# Patient Record
Sex: Female | Born: 1945
Health system: Southern US, Community
[De-identification: ages and names within clinical notes are randomized; demographics above are authoritative.]

## PROBLEM LIST (undated history)

## (undated) ENCOUNTER — Emergency Department (HOSPITAL_COMMUNITY): Admission: EM | Payer: Self-pay

## (undated) DIAGNOSIS — E559 Vitamin D deficiency, unspecified: Secondary | ICD-10-CM

## (undated) DIAGNOSIS — C50411 Malignant neoplasm of upper-outer quadrant of right female breast: Principal | ICD-10-CM

## (undated) DIAGNOSIS — Z923 Personal history of irradiation: Secondary | ICD-10-CM

## (undated) DIAGNOSIS — I10 Essential (primary) hypertension: Secondary | ICD-10-CM

## (undated) DIAGNOSIS — D649 Anemia, unspecified: Secondary | ICD-10-CM

## (undated) DIAGNOSIS — C50919 Malignant neoplasm of unspecified site of unspecified female breast: Secondary | ICD-10-CM

## (undated) DIAGNOSIS — E7800 Pure hypercholesterolemia, unspecified: Secondary | ICD-10-CM

## (undated) HISTORY — DX: Malignant neoplasm of unspecified site of unspecified female breast: C50.919

## (undated) HISTORY — DX: Anemia, unspecified: D64.9

## (undated) HISTORY — DX: Vitamin D deficiency, unspecified: E55.9

## (undated) HISTORY — DX: Malignant neoplasm of upper-outer quadrant of right female breast: C50.411

---

## 1998-01-02 ENCOUNTER — Emergency Department (HOSPITAL_COMMUNITY): Admission: EM | Admit: 1998-01-02 | Discharge: 1998-01-02 | Payer: Self-pay | Admitting: Emergency Medicine

## 1999-07-24 ENCOUNTER — Emergency Department (HOSPITAL_COMMUNITY): Admission: EM | Admit: 1999-07-24 | Discharge: 1999-07-24 | Payer: Self-pay | Admitting: Emergency Medicine

## 1999-07-24 ENCOUNTER — Encounter: Payer: Self-pay | Admitting: *Deleted

## 2004-05-15 ENCOUNTER — Emergency Department (HOSPITAL_COMMUNITY): Admission: EM | Admit: 2004-05-15 | Discharge: 2004-05-16 | Payer: Self-pay | Admitting: Emergency Medicine

## 2005-02-08 ENCOUNTER — Emergency Department (HOSPITAL_COMMUNITY): Admission: EM | Admit: 2005-02-08 | Discharge: 2005-02-08 | Payer: Self-pay | Admitting: Family Medicine

## 2005-02-10 ENCOUNTER — Emergency Department (HOSPITAL_COMMUNITY): Admission: EM | Admit: 2005-02-10 | Discharge: 2005-02-10 | Payer: Self-pay | Admitting: Emergency Medicine

## 2005-02-19 ENCOUNTER — Ambulatory Visit: Payer: Self-pay | Admitting: Family Medicine

## 2005-03-18 ENCOUNTER — Ambulatory Visit: Payer: Self-pay | Admitting: Family Medicine

## 2005-03-31 ENCOUNTER — Ambulatory Visit: Payer: Self-pay | Admitting: *Deleted

## 2005-04-13 ENCOUNTER — Encounter: Admission: RE | Admit: 2005-04-13 | Discharge: 2005-04-13 | Payer: Self-pay | Admitting: Family Medicine

## 2005-04-21 ENCOUNTER — Encounter: Admission: RE | Admit: 2005-04-21 | Discharge: 2005-04-21 | Payer: Self-pay | Admitting: Family Medicine

## 2005-05-13 ENCOUNTER — Ambulatory Visit: Payer: Self-pay | Admitting: Family Medicine

## 2005-06-14 ENCOUNTER — Ambulatory Visit: Payer: Self-pay | Admitting: Internal Medicine

## 2005-06-22 ENCOUNTER — Ambulatory Visit: Payer: Self-pay | Admitting: Family Medicine

## 2005-09-01 ENCOUNTER — Ambulatory Visit: Payer: Self-pay | Admitting: Family Medicine

## 2005-11-03 ENCOUNTER — Encounter: Admission: RE | Admit: 2005-11-03 | Discharge: 2005-11-03 | Payer: Self-pay | Admitting: Internal Medicine

## 2005-11-24 ENCOUNTER — Ambulatory Visit: Payer: Self-pay | Admitting: Internal Medicine

## 2006-04-21 ENCOUNTER — Ambulatory Visit: Payer: Self-pay | Admitting: Family Medicine

## 2006-05-03 ENCOUNTER — Ambulatory Visit: Payer: Self-pay | Admitting: Family Medicine

## 2006-08-23 DIAGNOSIS — I10 Essential (primary) hypertension: Secondary | ICD-10-CM

## 2006-09-12 ENCOUNTER — Ambulatory Visit: Payer: Self-pay | Admitting: Family Medicine

## 2006-09-12 LAB — CONVERTED CEMR LAB
Cholesterol: 144 mg/dL
HDL: 53 mg/dL
LDL Cholesterol: 74 mg/dL
Triglycerides: 85 mg/dL

## 2006-09-14 ENCOUNTER — Ambulatory Visit: Payer: Self-pay | Admitting: Family Medicine

## 2006-12-12 ENCOUNTER — Ambulatory Visit: Payer: Self-pay | Admitting: Family Medicine

## 2007-02-06 ENCOUNTER — Ambulatory Visit: Payer: Self-pay | Admitting: Family Medicine

## 2007-02-06 ENCOUNTER — Encounter (INDEPENDENT_AMBULATORY_CARE_PROVIDER_SITE_OTHER): Payer: Self-pay | Admitting: Family Medicine

## 2007-02-21 ENCOUNTER — Encounter (INDEPENDENT_AMBULATORY_CARE_PROVIDER_SITE_OTHER): Payer: Self-pay | Admitting: Family Medicine

## 2007-05-10 ENCOUNTER — Encounter (INDEPENDENT_AMBULATORY_CARE_PROVIDER_SITE_OTHER): Payer: Self-pay | Admitting: *Deleted

## 2007-05-22 ENCOUNTER — Telehealth (INDEPENDENT_AMBULATORY_CARE_PROVIDER_SITE_OTHER): Payer: Self-pay | Admitting: Family Medicine

## 2007-05-23 ENCOUNTER — Ambulatory Visit: Payer: Self-pay | Admitting: Family Medicine

## 2008-01-11 ENCOUNTER — Telehealth (INDEPENDENT_AMBULATORY_CARE_PROVIDER_SITE_OTHER): Payer: Self-pay | Admitting: Family Medicine

## 2008-03-18 ENCOUNTER — Ambulatory Visit: Payer: Self-pay | Admitting: Family Medicine

## 2008-03-18 ENCOUNTER — Encounter (INDEPENDENT_AMBULATORY_CARE_PROVIDER_SITE_OTHER): Payer: Self-pay | Admitting: Family Medicine

## 2008-03-18 DIAGNOSIS — E7800 Pure hypercholesterolemia, unspecified: Secondary | ICD-10-CM

## 2008-03-18 LAB — CONVERTED CEMR LAB
Glucose, Urine, Semiquant: NEGATIVE
Nitrite: NEGATIVE
WBC Urine, dipstick: NEGATIVE

## 2008-03-19 LAB — CONVERTED CEMR LAB: Pap Smear: NORMAL

## 2008-03-21 ENCOUNTER — Encounter (INDEPENDENT_AMBULATORY_CARE_PROVIDER_SITE_OTHER): Payer: Self-pay | Admitting: Family Medicine

## 2008-03-21 LAB — CONVERTED CEMR LAB
ALT: 14 units/L (ref 0–35)
AST: 17 units/L (ref 0–37)
Albumin: 4.6 g/dL (ref 3.5–5.2)
Alkaline Phosphatase: 115 units/L (ref 39–117)
Basophils Absolute: 0 10*3/uL (ref 0.0–0.1)
Calcium: 9.6 mg/dL (ref 8.4–10.5)
Cholesterol: 136 mg/dL (ref 0–200)
Eosinophils Absolute: 0.1 10*3/uL (ref 0.0–0.7)
Glucose, Bld: 101 mg/dL — ABNORMAL HIGH (ref 70–99)
Lymphs Abs: 2.2 10*3/uL (ref 0.7–4.0)
Monocytes Absolute: 0.4 10*3/uL (ref 0.1–1.0)
Monocytes Relative: 7 % (ref 3–12)
Neutro Abs: 2.8 10*3/uL (ref 1.7–7.7)
Potassium: 4.1 meq/L (ref 3.5–5.3)
RBC: 4.27 M/uL (ref 3.87–5.11)
RDW: 13.2 % (ref 11.5–15.5)
TSH: 1.039 microintl units/mL (ref 0.350–4.50)
Total Bilirubin: 0.6 mg/dL (ref 0.3–1.2)

## 2008-03-26 ENCOUNTER — Encounter (INDEPENDENT_AMBULATORY_CARE_PROVIDER_SITE_OTHER): Payer: Self-pay | Admitting: Family Medicine

## 2008-04-03 ENCOUNTER — Ambulatory Visit (HOSPITAL_COMMUNITY): Admission: RE | Admit: 2008-04-03 | Discharge: 2008-04-03 | Payer: Self-pay | Admitting: Family Medicine

## 2008-04-11 ENCOUNTER — Encounter (INDEPENDENT_AMBULATORY_CARE_PROVIDER_SITE_OTHER): Payer: Self-pay | Admitting: Family Medicine

## 2008-04-11 ENCOUNTER — Encounter: Admission: RE | Admit: 2008-04-11 | Discharge: 2008-04-11 | Payer: Self-pay | Admitting: Family Medicine

## 2009-04-10 ENCOUNTER — Encounter: Admission: RE | Admit: 2009-04-10 | Discharge: 2009-04-10 | Payer: Self-pay | Admitting: Internal Medicine

## 2009-04-21 ENCOUNTER — Ambulatory Visit: Payer: Self-pay | Admitting: Physician Assistant

## 2009-04-21 DIAGNOSIS — K029 Dental caries, unspecified: Secondary | ICD-10-CM | POA: Insufficient documentation

## 2009-04-22 ENCOUNTER — Encounter: Payer: Self-pay | Admitting: Physician Assistant

## 2009-04-23 ENCOUNTER — Telehealth: Payer: Self-pay | Admitting: Physician Assistant

## 2009-04-23 LAB — CONVERTED CEMR LAB
ALT: 13 units/L (ref 0–35)
Albumin: 4.5 g/dL (ref 3.5–5.2)
BUN: 17 mg/dL (ref 6–23)
Basophils Absolute: 0 10*3/uL (ref 0.0–0.1)
Basophils Relative: 1 % (ref 0–1)
Calcium: 10 mg/dL (ref 8.4–10.5)
Cholesterol: 141 mg/dL (ref 0–200)
Creatinine, Ser: 0.93 mg/dL (ref 0.40–1.20)
Eosinophils Absolute: 0.1 10*3/uL (ref 0.0–0.7)
Glucose, Bld: 89 mg/dL (ref 70–99)
Lymphs Abs: 2 10*3/uL (ref 0.7–4.0)
MCHC: 32.7 g/dL (ref 30.0–36.0)
Monocytes Absolute: 0.5 10*3/uL (ref 0.1–1.0)
Monocytes Relative: 8 % (ref 3–12)
Neutro Abs: 3.5 10*3/uL (ref 1.7–7.7)
Potassium: 4.6 meq/L (ref 3.5–5.3)
RDW: 13.5 % (ref 11.5–15.5)
Sodium: 141 meq/L (ref 135–145)
VLDL: 14 mg/dL (ref 0–40)

## 2009-04-24 ENCOUNTER — Encounter: Payer: Self-pay | Admitting: Physician Assistant

## 2009-04-29 ENCOUNTER — Telehealth: Payer: Self-pay | Admitting: Physician Assistant

## 2009-05-02 LAB — CONVERTED CEMR LAB
Iron: 86 ug/dL (ref 42–145)
UIBC: 258 ug/dL

## 2009-05-06 ENCOUNTER — Ambulatory Visit: Payer: Self-pay | Admitting: Internal Medicine

## 2009-05-13 ENCOUNTER — Encounter: Payer: Self-pay | Admitting: Physician Assistant

## 2009-05-21 ENCOUNTER — Ambulatory Visit: Payer: Self-pay | Admitting: Physician Assistant

## 2009-05-22 ENCOUNTER — Encounter: Payer: Self-pay | Admitting: Physician Assistant

## 2009-05-22 DIAGNOSIS — D649 Anemia, unspecified: Secondary | ICD-10-CM | POA: Insufficient documentation

## 2009-06-02 LAB — CONVERTED CEMR LAB
Basophils Absolute: 0 10*3/uL (ref 0.0–0.1)
HCT: 32.5 % — ABNORMAL LOW (ref 36.0–46.0)
Hemoglobin: 10.4 g/dL — ABNORMAL LOW (ref 12.0–15.0)
Lymphocytes Relative: 45 % (ref 12–46)
MCHC: 32 g/dL (ref 30.0–36.0)
MCV: 80 fL (ref 78.0–100.0)
Monocytes Relative: 8 % (ref 3–12)
Neutrophils Relative %: 45 % (ref 43–77)
Platelets: 282 10*3/uL (ref 150–400)

## 2009-06-30 ENCOUNTER — Ambulatory Visit: Payer: Self-pay | Admitting: Physician Assistant

## 2009-07-21 ENCOUNTER — Encounter (INDEPENDENT_AMBULATORY_CARE_PROVIDER_SITE_OTHER): Payer: Self-pay | Admitting: *Deleted

## 2009-07-21 LAB — CONVERTED CEMR LAB
Basophils Absolute: 0 10*3/uL (ref 0.0–0.1)
Basophils Relative: 1 % (ref 0–1)
Eosinophils Absolute: 0.1 10*3/uL (ref 0.0–0.7)
HCT: 33.9 % — ABNORMAL LOW (ref 36.0–46.0)
Hemoglobin: 10.8 g/dL — ABNORMAL LOW (ref 12.0–15.0)
MCHC: 31.9 g/dL (ref 30.0–36.0)
MCV: 83.1 fL (ref 78.0–100.0)
Monocytes Absolute: 0.4 10*3/uL (ref 0.1–1.0)
Neutro Abs: 3.1 10*3/uL (ref 1.7–7.7)
RBC: 4.08 M/uL (ref 3.87–5.11)

## 2009-08-06 ENCOUNTER — Ambulatory Visit: Payer: Self-pay | Admitting: Physician Assistant

## 2009-08-12 ENCOUNTER — Encounter: Payer: Self-pay | Admitting: Physician Assistant

## 2009-08-12 LAB — CONVERTED CEMR LAB: Hgb A: 97.5 % (ref 96.8–97.8)

## 2009-11-27 ENCOUNTER — Telehealth: Payer: Self-pay | Admitting: Physician Assistant

## 2010-02-26 ENCOUNTER — Telehealth: Payer: Self-pay | Admitting: Physician Assistant

## 2010-04-01 ENCOUNTER — Ambulatory Visit: Payer: Self-pay | Admitting: Physician Assistant

## 2010-04-01 DIAGNOSIS — R82998 Other abnormal findings in urine: Secondary | ICD-10-CM

## 2010-04-01 DIAGNOSIS — R002 Palpitations: Secondary | ICD-10-CM

## 2010-04-01 DIAGNOSIS — K5900 Constipation, unspecified: Secondary | ICD-10-CM | POA: Insufficient documentation

## 2010-04-02 ENCOUNTER — Encounter: Payer: Self-pay | Admitting: Physician Assistant

## 2010-04-06 ENCOUNTER — Encounter: Payer: Self-pay | Admitting: Physician Assistant

## 2010-04-08 ENCOUNTER — Emergency Department (HOSPITAL_COMMUNITY): Admission: EM | Admit: 2010-04-08 | Discharge: 2010-04-08 | Payer: Self-pay | Admitting: Family Medicine

## 2010-04-08 ENCOUNTER — Encounter (INDEPENDENT_AMBULATORY_CARE_PROVIDER_SITE_OTHER): Payer: Self-pay | Admitting: *Deleted

## 2010-04-15 ENCOUNTER — Encounter: Admission: RE | Admit: 2010-04-15 | Discharge: 2010-04-15 | Payer: Self-pay | Admitting: Internal Medicine

## 2010-04-22 ENCOUNTER — Encounter (INDEPENDENT_AMBULATORY_CARE_PROVIDER_SITE_OTHER): Payer: Self-pay | Admitting: *Deleted

## 2010-06-18 ENCOUNTER — Encounter (INDEPENDENT_AMBULATORY_CARE_PROVIDER_SITE_OTHER): Payer: Self-pay | Admitting: *Deleted

## 2010-08-28 ENCOUNTER — Encounter (INDEPENDENT_AMBULATORY_CARE_PROVIDER_SITE_OTHER): Payer: Self-pay | Admitting: Nurse Practitioner

## 2010-08-28 ENCOUNTER — Ambulatory Visit
Admission: RE | Admit: 2010-08-28 | Discharge: 2010-08-28 | Payer: Self-pay | Source: Home / Self Care | Attending: Nurse Practitioner | Admitting: Nurse Practitioner

## 2010-08-28 LAB — CONVERTED CEMR LAB
HDL goal, serum: 40 mg/dL
LDL Goal: 130 mg/dL

## 2010-09-13 ENCOUNTER — Encounter: Payer: Self-pay | Admitting: Occupational Therapy

## 2010-09-20 LAB — CONVERTED CEMR LAB
ALT: 15 units/L (ref 0–35)
BUN: 17 mg/dL (ref 6–23)
CO2: 27 meq/L (ref 19–32)
Calcium: 9.9 mg/dL (ref 8.4–10.5)
Creatinine, Ser: 1.12 mg/dL (ref 0.40–1.20)
Eosinophils Absolute: 0.1 10*3/uL (ref 0.0–0.7)
Eosinophils Relative: 3 % (ref 0–5)
Glucose, Urine, Semiquant: NEGATIVE
HCT: 34.2 % — ABNORMAL LOW (ref 36.0–46.0)
Lymphocytes Relative: 51 % — ABNORMAL HIGH (ref 12–46)
Lymphs Abs: 2 10*3/uL (ref 0.7–4.0)
MCV: 83 fL (ref 78.0–100.0)
Monocytes Absolute: 0.3 10*3/uL (ref 0.1–1.0)
Neutro Abs: 1.5 10*3/uL — ABNORMAL LOW (ref 1.7–7.7)
Neutrophils Relative %: 38 % — ABNORMAL LOW (ref 43–77)
Nitrite: NEGATIVE
Platelets: 270 10*3/uL (ref 150–400)
Potassium: 3.9 meq/L (ref 3.5–5.3)
RDW: 13.8 % (ref 11.5–15.5)
Sodium: 140 meq/L (ref 135–145)
Specific Gravity, Urine: 1.01
Total CHOL/HDL Ratio: 2.8
Total Protein: 7.7 g/dL (ref 6.0–8.3)
Urobilinogen, UA: 0.2
WBC: 3.9 10*3/uL — ABNORMAL LOW (ref 4.0–10.5)

## 2010-09-24 NOTE — Progress Notes (Signed)
Summary: Needs f/u; Refill x 3  Phone Note Outgoing Call   Summary of Call: Needs f/u appt.  Initial call taken by: Tereso Newcomer PA-C,  November 27, 2009 9:40 AM  Follow-up for Phone Call        left message to return call....Marland KitchenMarland KitchenMikey College CMA  November 28, 2009 8:58 AM   Left message on answering machine for pt to call back.Marland KitchenMarland KitchenMarland KitchenArmenia Shannon  November 28, 2009 12:58 PM   Additional Follow-up for Phone Call Additional follow up Details #1::        pt is aware and has appt Additional Follow-up by: [name],  December 01, 2009 3:38 PM    Prescriptions: TIAZAC 120 MG CP24 (DILTIAZEM HCL ER BEADS) TAKE ONE CAPSULE BY MOUTH DAILY   GENE HAS ICP TIAZAC 120MG  0508  #30 x 2   Entered and Authorized by:   Tereso Newcomer PA-C   Signed by:   Tereso Newcomer PA-C on 11/27/2009   Method used:   Faxed to ...       [facility] (retail)       [address] [place], [place]  [address]       Ph: [phone]       Fax: [phone]   RxID:   [id number] CRESTOR 5 MG TABS (ROSUVASTATIN CALCIUM) TAKE ONE (1) TABLET AT BEDTIME  #30 x 2   Entered and Authorized by:   Tereso Newcomer PA-C   Signed by:   Tereso Newcomer PA-C on 11/27/2009   Method used:   Faxed to ...       [facility] - [facility] (retail)       [address] [place], [place]  [address]       Ph: [phone]       Fax: [phone]   RxID:   [id number] BENICAR HCT 40-25 MG TABS (OLMESARTAN MEDOXOMIL-HCTZ) TAKE ONE (1) TABLET BY MOUTH EVERY DAY  #30 x 2   Entered and Authorized by:   Tereso Newcomer PA-C   Signed by:   Tereso Newcomer PA-C on 11/27/2009   Method used:   Faxed to ...       [facility] - [facility] (retail)       [address] [place], [place]  [address]       Ph: [phone]       Fax: [phone]   RxID:   [id number]

## 2010-09-24 NOTE — Letter (Signed)
Summary: *HSN Results Follow up  [facility]  [address] [place], [place] [address]   Phone: [phone]  Fax: [phone]      04/02/2010   MARYL BLALOCK [address] [place], [place]  [address]   Dear  Ms. Kaylyn Sebastiani,                            ____S.Drinkard,FNP   ____D. Gore,FNP       ____B. McPherson,MD   ____V. Rankins,MD    ____E. Mulberry,MD    ____N. Daphine Deutscher, FNP  ____D. Reche Dixon, MD    ____K. Philipp Deputy, MD    __x__S. Alben Spittle, PA-C    This letter is to inform you that your recent test(s):  _______Pap Smear    ___x____Lab Test     _______X-ray    ___x____ is within acceptable limits  _______ requires a medication change  _______ requires a follow-up lab visit  ___x____ requires  follow-up testing   Comments:  Kidney, liver function, cholesterol and thyroid were all ok.  Blood counts are still low and unchanged.  Please turn in the stool cards.  Someone will call you to arrange the colonoscopy.       _________________________________________________________ If you have any questions, please contact our office                     Sincerely,  Tereso Newcomer PA-C [facility]

## 2010-09-24 NOTE — Assessment & Plan Note (Signed)
Summary: f/u////cns   Vital Signs:  Patient profile:   65 year old female Height:      18857 inches Weight:      188 pounds BMI:     0.00 Temp:     97.8 degrees F oral Pulse rate:   85 / minute Pulse rhythm:   regular Resp:     18 per minute BP sitting:   115 / 73  (left arm) Cuff size:   regular  Vitals Entered By: [name] (April 01, 2010 2:06 PM) CC: follow-up visit Is Patient Diabetic? No Pain Assessment Patient in pain? no       Does patient need assistance? Functional Status Self care Ambulation Normal   Primary Care Provider:  Tereso Newcomer PA-C  CC:  follow-up visit.  History of Present Illness: Here for f/u. Not seen in a long time. Had palps last night.   Felt like heart racing for an hour.  No assoc. symptoms. Denies chest pain, dyspnea, syncope.  No orthopnea or PND.  No edmea.  No chronic cough.   No fevers or chills. No over the counter stimulants. Does admit to caffeine.   Current Medications (verified): 1)  Benicar Hct 40-25 Mg Tabs (Olmesartan Medoxomil-Hctz) .... Take One (1) Tablet By Mouth Every Day 2)  Crestor 5 Mg Tabs (Rosuvastatin Calcium) .... Take One (1) Tablet At Bedtime 3)  Tiazac 120 Mg Cp24 (Diltiazem Hcl Er Beads) .... Take One Capsule By Mouth Daily   Gene Has Icp Tiazac 120mg  0508 4)  Triamcinolone Acetonide 0.1 % Crea (Triamcinolone Acetonide) .... Apply To Skin Rash and Rub in Well Twice Daily Until Clear 5)  Vistaril 25 Mg Caps (Hydroxyzine Pamoate) .... One Capsule Every 8 Hours As Needed For Itching  Allergies (verified): 1)  ! Ibuprofen  Past History:  Past Medical History: Last updated: 08/12/2009 Current Problems:  * IBUPROFEN ALLERGY * ALBINO * STRABISMUS O.D. * SEVERE PERIODONTAL DISEASE * DYSLIPIDEMIA Anemia-iron deficiency (08/23/2006);improved1/08   a. N/C anemia; Iron studies, B12 and folate and Hgb Elec. all normal 06/2009; stool cards pending; colo. pending Hypertension (08/23/2006) H/o breast  nodule(benign),of left breast at 9 o'clock position .Initially noted  04/21/05.Had f/u mammogram6/30/08.  Family History: mOTHER DIED ? Father died age ? Mom - ? uterine cancer  Social History: Occupation:Mail handler(PART-TIME) at [employer] Never Smoked Alcohol use-no Drug use-no drinks a lot of caffeine  Review of Systems      See HPI GI:  Complains of constipation. GU:  Denies dysuria and urinary frequency.  Physical Exam  General:  alert, well-developed, and well-nourished.   Head:  normocephalic and atraumatic.   Eyes:  pupils equal, pupils round, and pupils reactive to light.   Mouth:  poor dentition.   Neck:  supple, no JVD, no carotid bruits, and no cervical lymphadenopathy.   Lungs:  normal breath sounds, no crackles, and no wheezes.   Heart:  normal rate, regular rhythm, and no murmur.   Abdomen:  soft, non-tender, and normal bowel sounds.   Extremities:  no edema  Neurologic:  alert & oriented X3 and cranial nerves II-XII intact.   Psych:  normally interactive.     Impression & Recommendations:  Problem # 1:  PALPITATIONS (ICD-785.1)  ? etiology get holter and echo check ecg today reduce caffeine f/u with me in 2 mos  Orders: EKG w/ Interpretation (93000) 2 D Echo (2 D Echo) 24 Hr Holter (24 Hr Holter) T-TSH ([address]-98119)  Problem # 2:  HYPERTENSION (  ICD-401.9)  controlled  Her updated medication list for this problem includes:    Benicar Hct 40-25 Mg Tabs (Olmesartan medoxomil-hctz) .[name]... Take one (1) tablet by mouth every day    Tiazac 120 Mg Cp24 (Diltiazem hcl er beads) .[name]... Take one capsule by mouth daily   gene has icp tiazac 120mg  0508  Orders: T-Comprehensive Metabolic Panel ([address]) UA Dipstick w/o Micro (manual) ([address])  Problem # 3:  HYPERLIPIDEMIA (ICD-272.4)  Her updated medication list for this problem includes:    Crestor 5 Mg Tabs (Rosuvastatin calcium) .[name]... Take one (1) tablet at  bedtime  Orders: T-Comprehensive Metabolic Panel [id number]) T-Lipid Profile ([address])  Problem # 4:  ANEMIA (ICD-285.9) hgb electro previously normal iron studies, b12 and folate ok ? related to chronic periodontal disease never had colo agreeable now refer to Dr. Doreatha Martin  Orders: T-CBC w/Diff [phone]) T-Hemoccult Cards-Multiple [id number]) Gastroenterology Referral (GI)  Problem # 5:  URINALYSIS, ABNORMAL (ICD-791.9) no symptoms of dysuria or frequency check cx  Orders: T-Culture, Urine ([id number])  Problem # 6:  CONSTIPATION (ICD-564.00) high fiber diet  Problem # 7:  DENTAL CARIES (ICD-521.00)  refer to dental  Orders: Dental Referral (Dentist)  Problem # 8:  Preventive Health Care (ICD-V70.0) has appt for mammo  Complete Medication List: 1)  Benicar Hct 40-25 Mg Tabs (Olmesartan medoxomil-hctz) .... Take one (1) tablet by mouth every day 2)  Crestor 5 Mg Tabs (Rosuvastatin calcium) .... Take one (1) tablet at bedtime 3)  Tiazac 120 Mg Cp24 (Diltiazem hcl er beads) .... Take one capsule by mouth daily   gene has icp tiazac 120mg  0508 4)  Triamcinolone Acetonide 0.1 % Crea (Triamcinolone acetonide) .... Apply to skin rash and rub in well twice daily until clear 5)  Vistaril 25 Mg Caps (Hydroxyzine pamoate) .... One capsule every 8 hours as needed for itching  Patient Instructions: 1)  Cut back on your caffeine.  Try to drink 1-2 cups of coffee or tea a day. 2)  Try to avoid over the counter decongestants and cold medicines like Sudafed, etc. 3)  Drink one glass of Metamucil a day. 4)  See handout about fiber. 5)  Follow up with me if constipation no better. 6)  Please schedule a follow-up appointment in 2 months for heart palpitations. 7)    Prescriptions: TIAZAC 120 MG CP24 (DILTIAZEM HCL ER BEADS) TAKE ONE CAPSULE BY MOUTH DAILY   GENE HAS ICP TIAZAC 120MG  0508  #30 x 5   Entered and Authorized by:   Tereso Newcomer PA-C   Signed by:   Tereso Newcomer PA-C  on 04/01/2010   Method used:   Faxed to ...       [facility] - [facility] (retail)       [address] [place], [place]  [address]       Ph: [phone]       Fax: [phone]   RxID:   [id number] CRESTOR 5 MG TABS (ROSUVASTATIN CALCIUM) TAKE ONE (1) TABLET AT BEDTIME  #30 x 5   Entered and Authorized by:   Tereso Newcomer PA-C   Signed by:   Tereso Newcomer PA-C on 04/01/2010   Method used:   Faxed to ...       [facility] - [facility] (retail)       [address] [place], [place]  [address]       Ph: [phone]  Fax: [phone]   RxID:   [id number] BENICAR HCT 40-25 MG TABS (OLMESARTAN MEDOXOMIL-HCTZ) TAKE ONE (1) TABLET BY MOUTH EVERY DAY  #30 x 5   Entered and Authorized by:   Tereso Newcomer PA-C   Signed by:   Tereso Newcomer PA-C on 04/01/2010   Method used:   Faxed to ...       [facility] - [facility] (retail)       [address] [place], [place]  [address]       Ph: [phone]       Fax: [phone]   RxID:   [id number]   Laboratory Results   Urine Tests    Routine Urinalysis   Color: yellow Appearance: Clear Glucose: negative   (Normal Range: Negative) Bilirubin: negative   (Normal Range: Negative) Ketone: negative   (Normal Range: Negative) Spec. Gravity: 1.010   (Normal Range: 1.003-1.035) Blood: negative   (Normal Range: Negative) pH: 7.5   (Normal Range: 5.0-8.0) Protein: negative   (Normal Range: Negative) Urobilinogen: 0.2   (Normal Range: 0-1) Nitrite: negative   (Normal Range: Negative) Leukocyte Esterace: trace   (Normal Range: Negative)       Appended Document: f/u////cns    Clinical Lists Changes  Observations: Added new observation of EKG INTERP: Normal sinus rhythm with rate of:  79 normal axis no ischemic changes  (04/01/2010 17:47)       EKG  Procedure date:  04/01/2010  Findings:      Normal sinus  rhythm with rate of:  79 normal axis no ischemic changes

## 2010-09-24 NOTE — Progress Notes (Signed)
  Phone Note Outgoing Call   Summary of Call: Needs f/u.  No showed to last appt made. Refills done x 3 months. No more refills until she has a f/u.  Initial call taken by: Tereso Newcomer PA-C,  February 26, 2010 1:38 PM  Follow-up for Phone Call        pt aware and graceila will give her appt Follow-up by: [name],  February 27, 2010 4:36 PM    Prescriptions: TIAZAC 120 MG CP24 (DILTIAZEM HCL ER BEADS) TAKE ONE CAPSULE BY MOUTH DAILY   GENE HAS ICP TIAZAC 120MG  0508  #30 x 2   Entered and Authorized by:   Tereso Newcomer PA-C   Signed by:   Tereso Newcomer PA-C on 02/26/2010   Method used:   Faxed to ...       [facility] - [facility] (retail)       [address] [place], [place]  [address]       Ph: [phone]       Fax: [phone]   RxID:   [id number] CRESTOR 5 MG TABS (ROSUVASTATIN CALCIUM) TAKE ONE (1) TABLET AT BEDTIME  #30 x 2   Entered and Authorized by:   Tereso Newcomer PA-C   Signed by:   Tereso Newcomer PA-C on 02/26/2010   Method used:   Faxed to ...       [facility] - [facility] (retail)       [address] [place], [place]  [address]       Ph: [phone]       Fax: [phone]   RxID:   [id number] BENICAR HCT 40-25 MG TABS (OLMESARTAN MEDOXOMIL-HCTZ) TAKE ONE (1) TABLET BY MOUTH EVERY DAY  #30 x 2   Entered and Authorized by:   Tereso Newcomer PA-C   Signed by:   Tereso Newcomer PA-C on 02/26/2010   Method used:   Faxed to ...       [facility] - [facility] (retail)       [address] [place], [place]  [address]       Ph: [phone]       Fax: [phone]   RxID:   [id number]

## 2010-09-24 NOTE — Letter (Signed)
Summary: Handout Printed  Printed Handout:  - Diet - High-Fiber 

## 2010-09-24 NOTE — Letter (Signed)
Summary: DENTAL REFERRAL  DENTAL REFERRAL   Imported By: Arta Bruce 04/06/2010 09:46:55  _____________________________________________________________________  External Attachment:    Type:   Image     Comment:   External Document

## 2010-09-24 NOTE — Assessment & Plan Note (Signed)
Summary: HTN   Vital Signs:  Patient profile:   65 year old female Height:      65.50 inches Weight:      182.5 pounds BMI:     30.02 Temp:     97.4 degrees F oral Pulse rate:   72 / minute Pulse rhythm:   regular Resp:     16 per minute BP sitting:   130 / 80  (left arm) Cuff size:   regular  Vitals Entered By: Levon Hedger (August 28, 2010 9:16 AM)  Nutrition Counseling: Patient's BMI is greater than 25 and therefore counseled on weight management options. CC: follow-up visit.Christina Kitchenpt does not know why she is here, Lipid Management, Hypertension Management Is Patient Diabetic? No Pain Assessment Patient in pain? no       Does patient need assistance? Ambulation Normal   Primary Care Provider:  Tereso Newcomer PA-C  CC:  follow-up visit.Christina Kitchenpt does not know why she is here, Lipid Management, and Hypertension Management.  History of Present Illness:  Pt into the office to f/u on palpitation as seen on last visit. Pt has stopped drinking chocolate and caffeine. no further palpitations since she has made this change  Obesity - down 6 pounds since her last visit.   No acute problems today  Hypertension History:      She denies headache, chest pain, and palpitations.  She notes no problems with any antihypertensive medication side effects.        Positive major cardiovascular risk factors include female age 52 years old or older, hyperlipidemia, and hypertension.  Negative major cardiovascular risk factors include no history of diabetes and non-tobacco-user status.        Further assessment for target organ damage reveals no history of ASHD, stroke/TIA, or peripheral vascular disease.    Lipid Management History:      Positive NCEP/ATP III risk factors include female age 18 years old or older and hypertension.  Negative NCEP/ATP III risk factors include no history of early menopause without estrogen hormone replacement, non-diabetic, non-tobacco-user status, no ASHD  (atherosclerotic heart disease), no prior stroke/TIA, no peripheral vascular disease, and no history of aortic aneurysm.        The patient states that she does not know about the "Therapeutic Lifestyle Change" diet.  The patient does not know about adjunctive measures for cholesterol lowering.  Adjunctive measures started by the patient include weight reduction.  She expresses no side effects from her lipid-lowering medication.  The patient denies any symptoms to suggest myopathy or liver disease.      Allergies (verified): 1)  ! Ibuprofen  Review of Systems General:  Complains of sleep disorder; denies fever; she has taken some otc sleep aids without resolution of symptoms. CV:  Denies palpitations. Resp:  Denies cough. GI:  Complains of constipation; denies abdominal pain, nausea, and vomiting.  Physical Exam  General:  alert.   Head:  normocephalic.   Eyes:  left eye strabismus.   Ears:  ear piercing(s) noted.   Lungs:  normal breath sounds.   Heart:  normal rate and regular rhythm.   Msk:  up to the exam table Neurologic:  alert & oriented X3.   Skin:  color normal.   Psych:  Oriented X3.     Impression & Recommendations:  Problem # 1:  HYPERTENSION (ICD-401.9) BP is stable continue current meds DASH diet Her updated medication list for this problem includes:    Benicar Hct 40-25 Mg Tabs (Olmesartan medoxomil-hctz) .Christina KitchenMarland KitchenMarland KitchenMarland Christina [name]  Take one (1) tablet by mouth every day    Tiazac 120 Mg Cp24 (Diltiazem hcl er beads) .Christina Christina [name]... Take one capsule by mouth daily   gene has icp tiazac 120mg  0508  Orders: Rapid HIV  ([address])  Problem # 2:  HYPERLIPIDEMIA (ICD-272.4) Pt to continue med as ordered  Her updated medication list for this problem includes:    Crestor 5 Mg Tabs (Rosuvastatin calcium) .Christina Christina [name]... Take one (1) tablet at bedtime  Problem # 3:  CONSTIPATION (ICD-564.00) handout given advised pt to increase fiber in diet  Problem # 4:  PALPITATIONS (ICD-785.1) improved with decrease  in caffiene  Problem # 5:  NEED PROPHYLACTIC VACCINATION&INOCULATION FLU (ICD-V04.81) given today  Complete Medication List: 1)  Benicar Hct 40-25 Mg Tabs (Olmesartan medoxomil-hctz) .... Take one (1) tablet by mouth every day 2)  Crestor 5 Mg Tabs (Rosuvastatin calcium) .... Take one (1) tablet at bedtime 3)  Tiazac 120 Mg Cp24 (Diltiazem hcl er beads) .... Take one capsule by mouth daily   gene has icp tiazac 120mg  0508 4)  Triamcinolone Acetonide 0.1 % Crea (Triamcinolone acetonide) .... Apply to skin rash and rub in well twice daily until clear  Other Orders: Flu Vaccine 23yrs + ([address]562) Admin 1st Vaccine (13086)  Hypertension Assessment/Plan:      The patient's hypertensive risk group is category B: At least one risk factor (excluding diabetes) with no target organ damage.  Her calculated 10 year risk of coronary heart disease is 7 %.  Today's blood pressure is 130/80.  Her blood pressure goal is < 140/90.  Lipid Assessment/Plan:      Based on NCEP/ATP III, the patient's risk factor category is "2 or more risk factors and a calculated 10 year CAD risk of < 20%".  The patient's lipid goals are as follows: Total cholesterol goal is 200; LDL cholesterol goal is 130; HDL cholesterol goal is 40; Triglyceride goal is 150.  Her LDL cholesterol goal has been met.    Patient Instructions: 1)  You have been given the flu vaccine today 2)  Follow up in 6 months for high blood pressure or sooner if necessary 3)  Get number for the dental clinic to see where you are on the list   Orders Added: 1)  Rapid HIV  [92370] 2)  Est. Patient Level III [57846] 3)  Flu Vaccine 52yrs + [90658] 4)  Admin 1st Vaccine [96295]   Immunizations Administered:  Influenza Vaccine # 1:    Vaccine Type: Fluvax 3+    Site: right deltoid    Mfr: GlaxoSmithKline    Dose: 0.5 ml    Route: IM    Given by: Levon Hedger    Exp. Date: 02/20/2011    Lot #: [id number]    VIS given: 03/17/10 version given August 28, 2010.  Flu Vaccine Consent Questions:    Do you have a history of severe allergic reactions to this vaccine? no    Any prior history of allergic reactions to egg and/or gelatin? no    Do you have a sensitivity to the preservative Thimersol? no    Do you have a past history of Guillan-Barre Syndrome? no    Do you currently have an acute febrile illness? no    Have you ever had a severe reaction to latex? no    Vaccine information given and explained to patient? yes    Are you currently pregnant? no    ndc  [phone]  Immunizations Administered:  Influenza  Vaccine # 1:    Vaccine Type: Fluvax 3+    Site: right deltoid    Mfr: GlaxoSmithKline    Dose: 0.5 ml    Route: IM    Given by: Levon Hedger    Exp. Date: 02/20/2011    Lot #: [id number]    VIS given: 03/17/10 version given August 28, 2010.  Prevention & Chronic Care Immunizations   Influenza vaccine: Fluvax 3+  (08/28/2010)    Tetanus booster: 11/21/2005: Tdap    Pneumococcal vaccine: Pneumovax  (03/18/2008)    H. zoster vaccine: Not documented  Colorectal Screening   Hemoccult: Negative  (05/03/2006)    Colonoscopy: referred  (05/12/2006)  Other Screening   Pap smear: normal  (03/19/2008)    Mammogram: ASSESSMENT: Negative - BI-RADS 1^MM DIGITAL SCREENING  (04/15/2010)    DXA bone density scan: Not documented   Smoking status: never  (03/18/2008)  Lipids   Total Cholesterol: 144  (04/01/2010)   LDL: 79  (04/01/2010)   LDL Direct: Not documented   HDL: 52  (04/01/2010)   Triglycerides: 64  (04/01/2010)    SGOT (AST): 17  (04/01/2010)   SGPT (ALT): 15  (04/01/2010)   Alkaline phosphatase: 98  (04/01/2010)   Total bilirubin: 0.5  (04/01/2010)  Hypertension   Last Blood Pressure: 130 / 80  (08/28/2010)   Serum creatinine: 1.12  (04/01/2010)   Serum potassium 3.9  (04/01/2010)  Self-Management Support :    Hypertension self-management support: Not documented    Lipid self-management  support: Not documented    Nursing Instructions: Give Flu vaccine today

## 2010-09-24 NOTE — Letter (Signed)
Summary: *HSN Results Follow up  [facility]  [address] [place], [place] [address]   Phone: [phone]  Fax: [phone]      04/22/2010   RACHELL DRUCKENMILLER [address] [place], [place]  [address]   Dear  Ms. Maricruz Merolla,                            ____S.Drinkard,FNP   ____D. Gore,FNP       ____B. McPherson,MD   ____V. Rankins,MD    ____E. Mulberry,MD    ____N. Daphine Deutscher, FNP  ____D. Reche Dixon, MD    ____K. Philipp Deputy, MD    __X__S.Weaver P.A     This letter is to inform you that your recent test(s):  _______Pap Smear    _______Lab Test     _______X-ray    _______ is within acceptable limits  _______ requires a medication change  _______ requires a follow-up lab visit  _______ requires a follow-up visit with your provider   Comments: I being trying  to get in contact with you but your phone  # is disconnected and please call our office and ask for Arna Medici  As soon as possible is regarding to [facility] GI appt  Thank you.       _________________________________________________________ If you have any questions, please contact our office                     Sincerely,  Cheryll Dessert [facility]

## 2010-09-24 NOTE — Letter (Signed)
Summary: *HSN Results Follow up  [facility]  [address] [place], [place] [address]   Phone: [phone]  Fax: [phone]      04/08/2010   Christina [name] [address] [place], [place]  [address]   Dear  Ms. [name],                            ____S.Drinkard,FNP   ____D. Gore,FNP       ____B. McPherson,MD   ____V. Rankins,MD    ____E. Mulberry,MD    ____N. Daphine Deutscher, FNP  ____D. Reche Dixon, MD    ____K. Philipp Deputy, MD    ____Other     This letter is to inform you that your recent test(s):  _______Pap Smear    ___X____Lab Test     _______X-ray    __X_____ is within acceptable limits  _______ requires a medication change  ___X____ requires a follow-up lab visit  _______ requires a follow-up visit with your provider   Comments: We have been trying to reach you.  Please give the office a call at your earliest convenience.       _________________________________________________________ If you have any questions, please contact our office                     Sincerely,  [name] [facility]

## 2010-09-24 NOTE — Letter (Signed)
Summary: *HSN Results Follow up  [facility]  [address] [place], [place] [address]   Phone: [phone]  Fax: [phone]      06/18/2010   Christina [name] [address] [place], [place]  [address]   Dear  [name],                            ____S.Drinkard,FNP   ____D. Gore,FNP       ____B. McPherson,MD   ____V. Rankins,MD    ____E. Mulberry,MD    ____N. Daphine Deutscher, FNP  ____D. Reche Dixon, MD    ____K. Philipp Deputy, MD    _X___Other     This letter is to inform you that your recent test(s):  _______Pap Smear    _______Lab Test     _______X-ray    _______ is within acceptable limits  _______ requires a medication change  _______ requires a follow-up lab visit  _______ requires a follow-up visit with your Christina [name]   Comments: [name] I BEING TRYING TO GET IN CONTACT WITH YOU BUT YOUR                    PHONE # DISC [phone]. PLEASE, CALL ME @ [phone] I WOULD LIKE TO                                     KNOW WHY YOU NO SHOW TO YOUR APPT TO [facility]  06-08-10 @ 8;45AM       _________________________________________________________ If you have any questions, please contact our office                     Sincerely,  Cheryll Dessert [facility]

## 2010-11-06 LAB — POCT I-STAT, CHEM 8
Chloride: 105 mEq/L (ref 96–112)
HCT: 37 % (ref 36.0–46.0)
Potassium: 4.1 mEq/L (ref 3.5–5.1)
TCO2: 28 mmol/L (ref 0–100)

## 2010-11-06 LAB — POCT CARDIAC MARKERS
Myoglobin, poc: 82.3 ng/mL (ref 12–200)
Troponin i, poc: <0.05 ng/mL (ref 0.00–0.09)

## 2011-02-02 ENCOUNTER — Other Ambulatory Visit: Payer: Self-pay | Admitting: Internal Medicine

## 2011-02-02 DIAGNOSIS — N6320 Unspecified lump in the left breast, unspecified quadrant: Secondary | ICD-10-CM

## 2011-02-08 ENCOUNTER — Ambulatory Visit
Admission: RE | Admit: 2011-02-08 | Discharge: 2011-02-08 | Disposition: A | Discharge status: Home / Self Care | Payer: Self-pay | Source: Ambulatory Visit | Attending: Internal Medicine | Admitting: Internal Medicine

## 2011-02-08 ENCOUNTER — Other Ambulatory Visit: Payer: Self-pay | Admitting: Internal Medicine

## 2011-02-08 DIAGNOSIS — N6320 Unspecified lump in the left breast, unspecified quadrant: Secondary | ICD-10-CM

## 2011-04-05 ENCOUNTER — Other Ambulatory Visit: Payer: Self-pay | Admitting: Internal Medicine

## 2011-04-05 DIAGNOSIS — Z1231 Encounter for screening mammogram for malignant neoplasm of breast: Secondary | ICD-10-CM

## 2011-04-19 ENCOUNTER — Ambulatory Visit
Admission: RE | Admit: 2011-04-19 | Discharge: 2011-04-19 | Disposition: A | Discharge status: Home / Self Care | Payer: Self-pay | Source: Ambulatory Visit | Attending: Internal Medicine | Admitting: Internal Medicine

## 2011-04-19 DIAGNOSIS — Z1231 Encounter for screening mammogram for malignant neoplasm of breast: Secondary | ICD-10-CM

## 2012-03-30 ENCOUNTER — Other Ambulatory Visit: Payer: Self-pay | Admitting: Internal Medicine

## 2012-03-30 DIAGNOSIS — Z1231 Encounter for screening mammogram for malignant neoplasm of breast: Secondary | ICD-10-CM

## 2012-04-19 ENCOUNTER — Ambulatory Visit
Admission: RE | Admit: 2012-04-19 | Discharge: 2012-04-19 | Disposition: A | Discharge status: Home / Self Care | Payer: Self-pay | Source: Ambulatory Visit | Attending: Internal Medicine | Admitting: Internal Medicine

## 2012-04-19 DIAGNOSIS — Z1231 Encounter for screening mammogram for malignant neoplasm of breast: Secondary | ICD-10-CM

## 2013-04-04 ENCOUNTER — Other Ambulatory Visit: Payer: Self-pay

## 2013-04-04 DIAGNOSIS — Z1231 Encounter for screening mammogram for malignant neoplasm of breast: Secondary | ICD-10-CM

## 2013-05-02 ENCOUNTER — Ambulatory Visit: Payer: Self-pay

## 2013-05-14 ENCOUNTER — Ambulatory Visit
Admission: RE | Admit: 2013-05-14 | Discharge: 2013-05-14 | Disposition: A | Discharge status: Home / Self Care | Payer: Medicare Other | Source: Ambulatory Visit

## 2013-05-14 DIAGNOSIS — Z1231 Encounter for screening mammogram for malignant neoplasm of breast: Secondary | ICD-10-CM

## 2013-08-27 ENCOUNTER — Emergency Department (HOSPITAL_COMMUNITY)
Admission: EM | Admit: 2013-08-27 | Discharge: 2013-08-27 | Disposition: A | Discharge status: Home / Self Care | Payer: Medicare Other | Attending: Emergency Medicine | Admitting: Emergency Medicine

## 2013-08-27 ENCOUNTER — Encounter (HOSPITAL_COMMUNITY): Payer: Self-pay | Admitting: Emergency Medicine

## 2013-08-27 DIAGNOSIS — M25579 Pain in unspecified ankle and joints of unspecified foot: Secondary | ICD-10-CM | POA: Insufficient documentation

## 2013-08-27 DIAGNOSIS — E7800 Pure hypercholesterolemia, unspecified: Secondary | ICD-10-CM | POA: Insufficient documentation

## 2013-08-27 DIAGNOSIS — I10 Essential (primary) hypertension: Secondary | ICD-10-CM | POA: Insufficient documentation

## 2013-08-27 DIAGNOSIS — M79605 Pain in left leg: Secondary | ICD-10-CM

## 2013-08-27 DIAGNOSIS — Z888 Allergy status to other drugs, medicaments and biological substances status: Secondary | ICD-10-CM | POA: Insufficient documentation

## 2013-08-27 HISTORY — DX: Essential (primary) hypertension: I10

## 2013-08-27 HISTORY — DX: Pure hypercholesterolemia, unspecified: E78.00

## 2013-08-27 MED ORDER — OXYCODONE-ACETAMINOPHEN 5-325 MG PO TABS
1.0000 | ORAL_TABLET | Freq: Once | ORAL | Status: AC
  Administered 2013-08-27: 1 via ORAL
  Filled 2013-08-27: qty 1

## 2013-08-27 MED ORDER — OXYCODONE-ACETAMINOPHEN 5-325 MG PO TABS: 1.0000 | ORAL_TABLET | ORAL | Status: DC | PRN

## 2013-08-27 MED ORDER — IBUPROFEN 800 MG PO TABS
800.0000 mg | ORAL_TABLET | Freq: Three times a day (TID) | ORAL | Status: DC
Start: 2013-08-27 — End: 2017-09-30

## 2013-08-27 NOTE — ED Provider Notes (Signed)
CSN: [id number]     Arrival date & time 08/27/13  2440 History   First MD Initiated Contact with Patient 08/27/13 1944     This chart was scribed for non-physician practitioner, Vernie Murders PA-C working with Orpah Greek, * by Forrestine Him, ED Scribe. This patient was seen in room TR09C/TR09C and the patient's care was started at 10:44 PM.   Chief Complaint  Patient presents with  . Leg Pain   The history is provided by the patient and a relative. No language interpreter was used.    HPI Comments: Christina [name] is a 68 y.o. female with a h/o HTN and high cholesterol who presents to the Emergency Department complaining of left leg pain.  Patient states that she has had intermittent left ankle pain for the past three days.  Her pain is mostly located on the left lateral ankle, but sometimes changes to her left anterior and posterior ankle.  She states her pain is worse with ambulation.  She denies any injuries or trauma.  She states she has had similar "arthritis" pain in her left knee in the past "when it gets cold out."  She denies any calf or foot pain.  No numbness, tingling, weakness, loss of sensation, or swelling.  She has tried [place] and OTC Advil with no relief. Pt says she currently is not experiencing any pain.  She denies pain to her knees. She denies ever having a blood clot. Pt denies any other complaints at this time. She otherwise has been well with no fevers, chills, change in appetite/activity, fatigue.     Past Medical History  Diagnosis Date  . Hypertension   . High cholesterol    History reviewed. No pertinent past surgical history. No family history on file. History  Substance Use Topics  . Smoking status: Never Smoker   . Smokeless tobacco: Not on file  . Alcohol Use: No   OB History   Grav Para Term Preterm Abortions TAB SAB Ect Mult Living                 Review of Systems  Constitutional: Negative for fever, chills, diaphoresis, activity  change, appetite change and fatigue.  Respiratory: Negative for shortness of breath.   Cardiovascular: Negative for chest pain and leg swelling.  Musculoskeletal: Positive for arthralgias (Positive for left ankle pain). Negative for back pain, gait problem, joint swelling and myalgias.  Skin: Negative for color change and wound.  Neurological: Negative for weakness and numbness.  All other systems reviewed and are negative.   Allergies  Ibuprofen  Home Medications  No current outpatient prescriptions on file.  Triage Vitals: BP 171/90  Pulse 78  Temp(Src) 99.1 F (37.3 C) (Oral)  Resp 18  Wt 188 lb 9 oz (85.531 kg)  SpO2 100%  Filed Vitals:   08/27/13 1828 08/27/13 2254  BP: 171/90 193/83  Pulse: 78 68  Temp: 99.1 F (37.3 C) 97.2 F (36.2 C)  TempSrc: Oral Oral  Resp: 18 16  Weight: 188 lb 9 oz (85.531 kg)   SpO2: 100% 99%    Physical Exam  Nursing note and vitals reviewed. Constitutional: She is oriented to person, place, and time. She appears well-developed and well-nourished. No distress.  HENT:  Head: Atraumatic.  Right Ear: External ear normal.  Left Ear: External ear normal.  Nose: Nose normal.  Mouth/Throat: Oropharynx is clear and moist.  Eyes: Conjunctivae are normal. Right eye exhibits no discharge. Left eye exhibits no discharge.  Neck: Normal range of motion. Neck supple.  Cardiovascular: Normal rate, regular rhythm, normal heart sounds and intact distal pulses.  Exam reveals no gallop and no friction rub.   No murmur heard. Dorsalis pedis pulses present and equal bilaterally  Pulmonary/Chest: Effort normal and breath sounds normal. No respiratory distress. She has no wheezes. She has no rales. She exhibits no tenderness.  Abdominal: Soft. She exhibits no distension. There is no tenderness.  Musculoskeletal: Normal range of motion. She exhibits no edema and no tenderness.       Feet:  Diffuse tenderness to palpation to the left lateral ankle.  No  achilles tenderness/palpable gap.  No calf tenderness/edema.  Strength 5/5 in the lower extremities bilaterally.  Patient able to ambulate without difficulty or ataxia.    Neurological: She is alert and oriented to person, place, and time.  Skin: Skin is warm and dry. She is not diaphoretic.  No erythema, edema, ecchymosis, or wounds to the LE throughout    ED Course  Procedures (including critical care time)  DIAGNOSTIC STUDIES: Oxygen Saturation is 100% on RA, Normal by my interpretation.    COORDINATION OF CARE:   Labs Review Labs Reviewed - No data to display Imaging Review No results found.  EKG Interpretation   None       MDM   Christina [name] is a 68 y.o. female with a h/o HTN and high cholesterol who presents to the Emergency Department complaining of left leg pain.  Rechecks  11:00 PM = Pain now returning (patient previously asymptomatic).  Offered Ibuprofen.  Patient and family upset that they have been taking that with no relief.  Explained that it is a good option for inflammatory pain.  1 percocet ordered.   11:15 PM = BP cheked manually by me and was 150/80 on the right arm.  BP was being checked on the patient's forearm due to the patient's constricting clothing. Will order ankle brace for support.   11:30 PM = Patient states she feels much better.  Ambulating without difficulty.     Patient evaluated in the ED for left leg pain.  Patient initially asymptomatic but later her pain returned.  Pain located on the left lateral ankle.  No hx of trauma.  No external signs of trauma/infection.  Patient neurovascularly intact.  Did not feel x-rays were needed at this time.  No leg edema to suggest DVT.  Patient afebrile and non-toxic.  Patient encouraged to try Ibuprofen for relief as well as the RICE method.  Given prescription for 6 Percocet for severe pain.  Patient had elevated BP with hx of HTN.  Patient states she has been taking her medications.  BP checked manually  by me which was stable. Patient has appointment with her PCP on Wednesday.  Return precautions, discharge instructions, and follow-up was discussed with the patient before discharge.     Discharge Medication List as of 08/27/2013 11:26 PM    START taking these medications   Details  ibuprofen (ADVIL,MOTRIN) 800 MG tablet Take 1 tablet (800 mg total) by mouth 3 (three) times daily., Starting 08/27/2013, Until Discontinued, Print    oxyCODONE-acetaminophen (PERCOCET/ROXICET) 5-325 MG per tablet Take 1 tablet by mouth every 4 (four) hours as needed for severe pain., Starting 08/27/2013, Until Discontinued, Print        Final impressions: 1. Leg pain, lateral, left      Harold Hedge Whittley Carandang PA-C         Lucila [place], PA-C  08/29/13 1004 

## 2013-08-27 NOTE — Discharge Instructions (Signed)
Keep leg elevated  Take Ibuprofen for mild-moderate pain 600-800 mg every 6-8 hours  Take Percocet for severe pain or at night - this may make you drowsy (do not drive while taking this medication) Return to the ED if you have any leg swelling of your left leg, spreading redness/swelling, fever, weakness, loss of sensation, or other concerns (see below) Follow-up with your doctor on Wednesday at your scheduled appointment      RICE: Routine Care for Injuries The routine care of many injuries includes Rest, Ice, Compression, and Elevation (RICE). HOME CARE INSTRUCTIONS  Rest is needed to allow your body to heal. Routine activities can usually be resumed when comfortable. Injured tendons and bones can take up to 6 weeks to heal. Tendons are the cord-like structures that attach muscle to bone.  Ice following an injury helps keep the swelling down and reduces pain.  Put ice in a plastic bag.  Place a towel between your skin and the bag.  Leave the ice on for 15-20 minutes, 03-04 times a day. Do this while awake, for the first 24 to 48 hours. After that, continue as directed by your caregiver.  Compression helps keep swelling down. It also gives support and helps with discomfort. If an elastic bandage has been applied, it should be removed and reapplied every 3 to 4 hours. It should not be applied tightly, but firmly enough to keep swelling down. Watch fingers or toes for swelling, bluish discoloration, coldness, numbness, or excessive pain. If any of these problems occur, remove the bandage and reapply loosely. Contact your caregiver if these problems continue.  Elevation helps reduce swelling and decreases pain. With extremities, such as the arms, hands, legs, and feet, the injured area should be placed near or above the level of the heart, if possible. SEEK IMMEDIATE MEDICAL CARE IF:  You have persistent pain and swelling.  You develop redness, numbness, or unexpected weakness.  Your  symptoms are getting worse rather than improving after several days. These symptoms may indicate that further evaluation or further X-rays are needed. Sometimes, X-rays may not show a small broken bone (fracture) until 1 week or 10 days later. Make a follow-up appointment with your caregiver. Ask when your X-ray results will be ready. Make sure you get your X-ray results. Document Released: 11/21/2000 Document Revised: 11/01/2011 Document Reviewed: 01/08/2011 [facility] Patient Information 2014 [place], [place].

## 2013-08-27 NOTE — ED Notes (Signed)
Pt. reports left lower leg pain / left ankle pain onset last month , denies injury or fall , ambulatory.

## 2013-08-31 NOTE — ED Provider Notes (Signed)
Medical screening examination/treatment/procedure(s) were performed by non-physician practitioner and as supervising physician I was immediately available for consultation/collaboration.  Christopher J. Pollina, MD 08/31/13 0758 

## 2013-09-04 ENCOUNTER — Encounter (HOSPITAL_COMMUNITY): Payer: Self-pay | Admitting: Emergency Medicine

## 2013-09-04 ENCOUNTER — Emergency Department (HOSPITAL_COMMUNITY): Payer: Medicare Other

## 2013-09-04 ENCOUNTER — Emergency Department (HOSPITAL_COMMUNITY)
Admission: EM | Admit: 2013-09-04 | Discharge: 2013-09-04 | Disposition: A | Discharge status: Home / Self Care | Payer: Medicare Other | Attending: Emergency Medicine | Admitting: Emergency Medicine

## 2013-09-04 DIAGNOSIS — I10 Essential (primary) hypertension: Secondary | ICD-10-CM | POA: Insufficient documentation

## 2013-09-04 DIAGNOSIS — E789 Disorder of lipoprotein metabolism, unspecified: Secondary | ICD-10-CM | POA: Insufficient documentation

## 2013-09-04 DIAGNOSIS — M25572 Pain in left ankle and joints of left foot: Secondary | ICD-10-CM

## 2013-09-04 DIAGNOSIS — M25579 Pain in unspecified ankle and joints of unspecified foot: Secondary | ICD-10-CM | POA: Insufficient documentation

## 2013-09-04 DIAGNOSIS — Z79899 Other long term (current) drug therapy: Secondary | ICD-10-CM | POA: Insufficient documentation

## 2013-09-04 MED ORDER — OXYCODONE-ACETAMINOPHEN 5-325 MG PO TABS: 1.0000 | ORAL_TABLET | ORAL | Status: DC | PRN

## 2013-09-04 MED ORDER — OXYCODONE-ACETAMINOPHEN 5-325 MG PO TABS
1.0000 | ORAL_TABLET | Freq: Once | ORAL | Status: AC
Start: 2013-09-04 — End: 2013-09-04
  Administered 2013-09-04: 1 via ORAL
  Filled 2013-09-04: qty 1

## 2013-09-04 NOTE — ED Provider Notes (Signed)
CSN: [id number]     Arrival date & time 09/04/13  1649 History  This chart was scribed for non-physician practitioner Lucila [place], PA-C working with Virgel Manifold, MD by Rolanda Lundborg, ED Scribe. This patient was seen in room TR08C/TR08C and the patient's care was started at [facility] PM.    Chief Complaint  Patient presents with  . Leg Pain   The history is provided by the patient. No language interpreter was used.   HPI Comments: Christina [name] is a 68 y.o. female with a PMH of HTN and high cholesterol who presents to the Emergency Department complaining of intermittent left ankle pain onset one month ago. Her pain is located on the outside of the left ankle with radiation across the front and back of the ankle.  She denies injury, trauma or fall. She was seen here for the same on 08/27/13 and denies new injuries. She states the aching pain is worse in the cold.  She is able to ambulate fine. She reports no relief with taking ibuprofen but she reports relief with percocet. She has an appointment with her PCP in 2 weeks for a referral to a foot specialist. She denies fevers, numbness or tingling, weakness, leg swelling, or loss of sensation.     Past Medical History  Diagnosis Date  . Hypertension   . High cholesterol    History reviewed. No pertinent past surgical history. No family history on file. History  Substance Use Topics  . Smoking status: Never Smoker   . Smokeless tobacco: Not on file  . Alcohol Use: No   OB History   Grav Para Term Preterm Abortions TAB SAB Ect Mult Living                 Review of Systems  Constitutional: Negative for fever.  Musculoskeletal: Positive for arthralgias (left ankle).  Neurological: Negative for numbness.  All other systems reviewed and are negative.   Allergies  Aspirin  Home Medications   Current Outpatient Rx  Name  Route  Sig  Dispense  Refill  . atenolol (TENORMIN) 25 MG tablet   Oral   Take 25 mg by mouth daily.          . hydrochlorothiazide (HYDRODIURIL) 25 MG tablet   Oral   Take 25 mg by mouth daily.         Christina [name] ibuprofen (ADVIL,MOTRIN) 800 MG tablet   Oral   Take 1 tablet (800 mg total) by mouth 3 (three) times daily.   21 tablet   0   . losartan (COZAAR) 25 MG tablet   Oral   Take 25 mg by mouth daily.         Christina [name] oxyCODONE-acetaminophen (PERCOCET/ROXICET) 5-325 MG per tablet   Oral   Take 1 tablet by mouth every 4 (four) hours as needed for severe pain.   6 tablet   0   . PRESCRIPTION MEDICATION   Oral   Take 1 tablet by mouth daily. Blood pressure medication          BP 140/66  Pulse 72  Temp(Src) 98 F (36.7 C) (Oral)  Resp 18  Ht 5\' 6"  (1.676 m)  Wt 187 lb 3 oz (84.908 kg)  BMI 30.23 kg/m2  SpO2 100%  Filed Vitals:   09/04/13 1658 09/04/13 2035  BP: 140/66 148/89  Pulse: 72 71  Temp: 98 F (36.7 C)   TempSrc: Oral   Resp: 18 17  Height: 5\' 6"  (1.676 m)  Weight: 187 lb 3 oz (84.908 kg)   SpO2: 100% 100%    Physical Exam  Nursing note and vitals reviewed. Constitutional: She is oriented to person, place, and time. She appears well-developed and well-nourished. No distress.  HENT:  Head: Normocephalic and atraumatic.  Right Ear: External ear normal.  Left Ear: External ear normal.  Mouth/Throat: Oropharynx is clear and moist.  Eyes: Conjunctivae are normal. Right eye exhibits no discharge. Left eye exhibits no discharge.  Neck: Neck supple. No tracheal deviation present.  Cardiovascular: Normal rate, regular rhythm, normal heart sounds and intact distal pulses.  Exam reveals no gallop and no friction rub.   No murmur heard. Dorsalis pedis pulses present and equal bilaterally  Pulmonary/Chest: Effort normal and breath sounds normal. No respiratory distress. She has no wheezes. She has no rales. She exhibits no tenderness.  Abdominal: Soft. She exhibits no distension. There is no tenderness.  Musculoskeletal: Normal range of motion. She exhibits tenderness. She  exhibits no edema.       Feet:  Tenderness to palpation to the left lateral malleolus.  Pain increased with ROM of the left ankle and without limitations.  No calf, foot, or knee tenderness.  No palpable gap to the achilles.  Strength 5/5 in the lower extremities bilaterally. Patient able to ambulate without difficulty or ataxia.  No pedal edema bilaterally  Neurological: She is alert and oriented to person, place, and time.  Sensation intact  Skin: Skin is warm and dry. She is not diaphoretic.  No wounds, ecchymosis, edema, erythema to the LE bilaterally  Psychiatric: She has a normal mood and affect. Her behavior is normal.    ED Course  Procedures (including critical care time) Medications - No data to display  DIAGNOSTIC STUDIES: Oxygen Saturation is 100% on RA, normal by my interpretation.    COORDINATION OF CARE: 6:45 PM- Discussed treatment plan with pt which includes ankle x-ray. Pt agrees to plan.    Labs Review Labs Reviewed - No data to display Imaging Review Dg Ankle Complete Left  09/04/2013   CLINICAL DATA:  Left ankle pain  EXAM: LEFT ANKLE COMPLETE - 3+ VIEW  COMPARISON:  None.  FINDINGS: There is no evidence of fracture, dislocation, or joint effusion. There is no evidence of arthropathy or other focal bone abnormality. Soft tissues are unremarkable.  IMPRESSION: No acute abnormality noted.   Electronically Signed   By: Inez Catalina M.D.   On: 09/04/2013 19:57    EKG Interpretation   None       DG Ankle Complete Left (Final result)  Result time: 09/04/13 19:57:38    Final result by Rad Results In Interface (09/04/13 19:57:38)    Narrative:   CLINICAL DATA: Left ankle pain  EXAM: LEFT ANKLE COMPLETE - 3+ VIEW  COMPARISON: None.  FINDINGS: There is no evidence of fracture, dislocation, or joint effusion. There is no evidence of arthropathy or other focal bone abnormality. Soft tissues are unremarkable.  IMPRESSION: No acute abnormality  noted.   Electronically Signed By: Inez Catalina M.D. On: 09/04/2013 19:57         MDM   Kirstie Mirza Bai is a 68 y.o. female with a PMH of HTN and high cholesterol who presents to the Emergency Department complaining of intermittent left ankle pain onset one month ago.  Etiology of left ankle pain possibly due to a sprain vs strain vs arthritis.  Patient seen a few weeks ago for a similar complaint.  No evidence of an  infectious process.  Patient afebrile and non-toxic in appearance.  No evidence of leg swelling or calf tenderness to suggest DVT.  Patient neurovascularly intact.  She followed up with her doctor and has a referral to a foot specialist in two weeks. X-rays negative for fx or malalignment. She returns today with no acute changes however requires continued pain control.  Her PCP did not refill her Percocet prescription, which the patient states she needs at night.  Patient has made attempts to try to this refilled, but has been unsuccessful.  Patient informed that the ED is not the place for prescription refill, however, a short course was provided.  Patient encouraged to continue Ibuprofen and RICE.  Patient instructed to continue to wear her ankle brace for support, which she received on her last visit.  Return precautions, discharge instructions, and follow-up was discussed with the patient before discharge.  Patient has ride home from emergency department.      Discharge Medication List as of 09/04/2013  8:55 PM    START taking these medications   Details  !! oxyCODONE-acetaminophen (PERCOCET/ROXICET) 5-325 MG per tablet Take 1-2 tablets by mouth every 4 (four) hours as needed for severe pain., Starting 09/04/2013, Until Discontinued, Print     !! - Potential duplicate medications found. Please discuss with provider.       Final impressions: 1. Left ankle pain      Denman George   I personally performed the services described in this documentation, which  was scribed in my presence. The recorded information has been reviewed and is accurate.       Lucila [place], PA-C 09/06/13 [phone]

## 2013-09-04 NOTE — Discharge Instructions (Signed)
Continue to take Ibuprofen - this helps with pain and inflammation Take Percocet at night - Please be careful with this medication.  It can cause drowsiness.  Use caution while driving, operating machinery, drinking alcohol, or any other activities that may impair your physical or mental abilities.   Continue RICE method - see below  Use ankle brace  Return to the emergency department if you develop any changing/worsening condition, weakness, loss of sensation, fever, leg swelling/redness, or any other concerns (please read additional information regarding your condition below)  Ankle Pain Ankle pain is a common symptom. The bones, cartilage, tendons, and muscles of the ankle joint perform a lot of work each day. The ankle joint holds your body weight and allows you to move around. Ankle pain can occur on either side or back of 1 or both ankles. Ankle pain may be sharp and burning or dull and aching. There may be tenderness, stiffness, redness, or warmth around the ankle. The pain occurs more often when a person walks or puts pressure on the ankle. CAUSES  There are many reasons ankle pain can develop. It is important to work with your caregiver to identify the cause since many conditions can impact the bones, cartilage, muscles, and tendons. Causes for ankle pain include:  Injury, including a break (fracture), sprain, or strain often due to a fall, sports, or a high-impact activity.  Swelling (inflammation) of a tendon (tendonitis).  Achilles tendon rupture.  Ankle instability after repeated sprains and strains.  Poor foot alignment.  Pressure on a nerve (tarsal tunnel syndrome).  Arthritis in the ankle or the lining of the ankle.  Crystal formation in the ankle (gout or pseudogout). DIAGNOSIS  A diagnosis is based on your medical history, your symptoms, results of your physical exam, and results of diagnostic tests. Diagnostic tests may include X-ray exams or a computerized magnetic scan  (magnetic resonance imaging, MRI). TREATMENT  Treatment will depend on the cause of your ankle pain and may include:  Keeping pressure off the ankle and limiting activities.  Using crutches or other walking support (a cane or brace).  Using rest, ice, compression, and elevation.  Participating in physical therapy or home exercises.  Wearing shoe inserts or special shoes.  Losing weight.  Taking medications to reduce pain or swelling or receiving an injection.  Undergoing surgery. HOME CARE INSTRUCTIONS   Only take over-the-counter or prescription medicines for pain, discomfort, or fever as directed by your caregiver.  Put ice on the injured area.  Put ice in a plastic bag.  Place a towel between your skin and the bag.  Leave the ice on for 15-20 minutes at a time, 03-04 times a day.  Keep your leg raised (elevated) when possible to lessen swelling.  Avoid activities that cause ankle pain.  Follow specific exercises as directed by your caregiver.  Record how often you have ankle pain, the location of the pain, and what it feels like. This information may be helpful to you and your caregiver.  Ask your caregiver about returning to work or sports and whether you should drive.  Follow up with your caregiver for further examination, therapy, or testing as directed. SEEK MEDICAL CARE IF:   Pain or swelling continues or worsens beyond 1 week.  You have an oral temperature above 102 F (38.9 C).  You are feeling unwell or have chills.  You are having an increasingly difficult time with walking.  You have loss of sensation or other new symptoms.  You have questions or concerns. MAKE SURE YOU:   Understand these instructions.  Will watch your condition.  Will get help right away if you are not doing well or get worse. Document Released: 01/27/2010 Document Revised: 11/01/2011 Document Reviewed: 01/27/2010 [facility] Patient Information 2014 [place].  RICE: Routine Care for Injuries The routine care of many injuries includes Rest, Ice, Compression, and Elevation (RICE). HOME CARE INSTRUCTIONS  Rest is needed to allow your body to heal. Routine activities can usually be resumed when comfortable. Injured tendons and bones can take up to 6 weeks to heal. Tendons are the cord-like structures that attach muscle to bone.  Ice following an injury helps keep the swelling down and reduces pain.  Put ice in a plastic bag.  Place a towel between your skin and the bag.  Leave the ice on for 15-20 minutes, 03-04 times a day. Do this while awake, for the first 24 to 48 hours. After that, continue as directed by your caregiver.  Compression helps keep swelling down. It also gives support and helps with discomfort. If an elastic bandage has been applied, it should be removed and reapplied every 3 to 4 hours. It should not be applied tightly, but firmly enough to keep swelling down. Watch fingers or toes for swelling, bluish discoloration, coldness, numbness, or excessive pain. If any of these problems occur, remove the bandage and reapply loosely. Contact your caregiver if these problems continue.  Elevation helps reduce swelling and decreases pain. With extremities, such as the arms, hands, legs, and feet, the injured area should be placed near or above the level of the heart, if possible. SEEK IMMEDIATE MEDICAL CARE IF:  You have persistent pain and swelling.  You develop redness, numbness, or unexpected weakness.  Your symptoms are getting worse rather than improving after several days. These symptoms may indicate that further evaluation or further X-rays are needed. Sometimes, X-rays may not show a small broken bone (fracture) until 1 week or 10 days later. Make a follow-up appointment with your caregiver. Ask when your X-ray results will be ready. Make sure you get your X-ray results. Document Released: 11/21/2000 Document Revised:  11/01/2011 Document Reviewed: 01/08/2011 [facility] Patient Information 2014 [place], [place].   Emergency Department Resource Guide 1) Find a Doctor and Pay Out of Pocket Although you won't have to find out who is covered by your insurance plan, it is a good idea to ask around and get recommendations. You will then need to call the office and see if the doctor you have chosen will accept you as a new patient and what types of options they offer for patients who are self-pay. Some doctors offer discounts or will set up payment plans for their patients who do not have insurance, but you will need to ask so you aren't surprised when you get to your appointment.  2) Contact Your Local Health Department Not all health departments have doctors that can see patients for sick visits, but many do, so it is worth a call to see if yours does. If you don't know where your local health department is, you can check in your phone book. The CDC also has a tool to help you locate your state's health department, and many state websites also have listings of all of their local health departments.  3) Find a [facility] If your illness is not likely to be very severe or complicated, you may want to try a walk in clinic. These are popping  up all over the country in pharmacies, drugstores, and shopping centers. They're usually staffed by nurse practitioners or physician assistants that have been trained to treat common illnesses and complaints. They're usually fairly quick and inexpensive. However, if you have serious medical issues or chronic medical problems, these are probably not your best option.  No Primary Care Doctor: - Call Health Connect at  [phone] - they can help you locate a primary care doctor that  accepts your insurance, provides certain services, etc. - Physician Referral Service- [phone]  Chronic Pain Problems: Organization         Address  Phone   Notes  [facility]   [phone] Patients need to be referred by their primary care doctor.   Medication Assistance: Organization         Address  Phone   Notes  [facility] Medication [facility] [address] [place], [place] [address] [phone] --Must be a resident of [facility] -- Must have NO insurance coverage whatsoever (no Medicaid/ Medicare, etc.) -- The pt. MUST have a primary care doctor that directs their care regularly and follows them in the community   MedAssist  [phone]   [employer]  [phone]    Agencies that provide inexpensive medical care: Organization         Address  Phone   Notes  [place]  [phone]   [facility]    [phone]   [facility] [place], [place] [phone]   [facility] [address] [place] [phone]   Planned Parenthood    [phone]   [facility]    [phone]   [place] and [place] [address], [place] Phone:  [phone], Fax:  [phone] Hours of Operation:  9 am - 6 pm, M-F.  Also accepts Medicaid/Medicare and self-pay.  [facility] for [place] [address], [address], [place] Phone: [phone], Fax: [phone]. Hours of Operation:  8:30 am - 5:30 pm, M-F.  Also accepts Medicaid and self-pay.  [facility] [address] [place] Phone: [phone]   [place], [place], [place] [phone], Ext. [phone] Mondays & Thursdays: 7-9 AM.  First 15 patients are seen on a first come, first serve basis.    Hope Providers:  Organization         Address  Phone   Notes  [facility] [address], [place] [phone] Also accepts self-pay patients.  [facility] [address], [place]  [phone]   [place], [address], [place] [phone]   [facility] [address], [place] [phone]   Lucianne Lei [address], [place]   [phone] Only accepts [place] Access [place] patients after they have their name applied to their card.   Self-Pay (no insurance) in [facility]:  Organization         Address  Phone   Notes  Sickle Cell Patients, [facility] [place] [phone]   [facility] Urgent [facility] [place] [phone]   [facility] Pont[facility]gent [place]  [phone]  [address], [place] [phone]   [facility]/Dr. Osei-Bonsu  [address] [place] or [address], [place] [phone] Phone number for both [place] and [place] locations is the same.  [facility]gent [facility] and [facility] [address] [place] [phone]   [facility] [address], [place] or [address] [phone]   [facility] [address], [place] [phone], phone; [phone], fax Sees patients 1st and 3rd Saturday of every month.  Must not qualify for public or private insurance (i.e. Medicaid, Medicare, [place] Health Choice, Veterans' Benefits)  Household income should be no more than 200% of the poverty level The clinic cannot treat you if you are pregnant or think you are pregnant  Sexually transmitted diseases are not treated at the clinic.    Dental Care: Organization         Address  Phone  Notes  [facility] Department of [facility] [phone] Accepts children up to age 84 who are enrolled in [place] or [place]; pregnant women with a Medicaid card; and children who have applied for Medicaid or [place] Health  Choice, but were declined, whose parents can pay a reduced fee at time of service.  [facility] Department of [facility]  [address], [place] [phone] Accepts children up to age 97 who are enrolled in [place] or [place]; pregnant women with a Medicaid card; and children who have applied for Medicaid or [place] Health Choice, but were declined, whose parents can pay a reduced fee at time of service.  [place] Adult Dental Access PROGRAM  [place] [phone] Patients are seen by appointment only. Walk-ins are not accepted. [place] will see patients 9 years of age and older. Monday - Tuesday (8am-5pm) Most Wednesdays (8:30-5pm) $30 per visit, cash only  [facility] Adult Dental Access PROGRAM  [address], [facility] [phone] Patients are seen by appointment only. Walk-ins are not accepted. [place] will see patients 83 years of age and older. One Wednesday Evening (Monthly: Volunteer Based).  $30 per visit, cash only  [place]  [phone] for adults; Children under age 54, call Graduate Pediatric Dentistry at [phone]. Children aged 12-14, please call [phone] to request a pediatric application.  Dental services are provided in all areas of dental care including fillings, crowns and bridges, complete and partial dentures, implants, gum treatment, root canals, and extractions. Preventive care is also provided. Treatment is provided to both adults and children. Patients are selected via a lottery and there is often a waiting list.   [facility] [address], [place]  [phone] [email or web address]   [facility] [address] [place], [place] [phone], Ext. [phone] Second and Fourth Thursday of each month, opens at 6:30 AM; Clinic ends at 9 AM.  Patients are seen on a first-come first-served basis, and a limited number are seen during each  clinic.   [facility]  [address] Hillard Danker [place], [place] [phone]   Eligibility Requirements You must have lived in [place], [place], or [place] counties for at least the last three months.   You cannot be eligible for state or federal sponsored [employer], including [employer], [place], or  Medicare.   You generally cannot be eligible for healthcare insurance through your employer.    How to apply: Eligibility screenings are held every Tuesday and Wednesday afternoon from 1:00 pm until 4:00 pm. You do not need an appointment for the interview!  [facility] [address], [place], [place] [phone]   [facility]  [phone]   [facility]  [phone]   [facility]  [phone]    Behavioral Health Resources in the Community: Intensive Outpatient Programs Organization         Address  Phone  Notes  [facility] [address], [place], [place] [phone]   [facility]patient [address], [place], [place] [phone]   [facility] [address], [place], [place]  [phone]   [facility] [address],  [place], [place] [phone] or [phone]   Substance Abuse Resources Organization         Address  Phone  Notes  [facility] Drug Services  [phone]   [facility]  [phone]   [facility]  [phone]   [facility]  [phone]   Residential & Outpatient Substance Abuse Program  [phone]   Psychological Services Organization         Address  Phone  Notes  [facility]  [phone]- [phone]   [facility]  [phone]   [facility] [address], [place] [phone] or [phone]    Mobile Crisis Teams Organization         Address  Phone  Notes  [facility], Mobile  Crisis Care Unit  [phone]   [facility]  [address] [place], [place] [phone]   Doristine LocksSharon DeEsch [address] [place] [phone]    Self-Help/Support Groups Organization         Address  Phone             Notes  [facility] [place] - variety of support groups  [phone] Call for more information  Narcotics Anonymous (NA), [facility] [address], [place]  2 meetings at this location   Statisticianesidential Treatment Programs Organization         Address  Phone  Notes  [facility] [address],    [place]  [phone]   [facility]  [address], [place] [phone], [place], [place] [phone]   [facility] [address], [place] [phone] Admissions: 8am-3pm M-F  [facility] [address]    [place], [place] [phone]   [facility] [address], [place], [place] [phone]   [facility] [address]  [place], [place] [phone]   [facility] - Intensive Outpatient [address], [place], [place] [phone]   [facility] (Addiction Recovery Care [facility] [address]  [place], [place] [phone] or [phone]   [facility] (RTS) [address] [place], [place] [phone] Accepts Medicaid  [facility] [address]  [place] [phone] Substance Abuse/Addiction Treatment   [facility] Resources Organization         Address  Phone  Notes  [facility]  [phone]   Angie FavaJulie Brannon, PhD [address] [place], [place]   [phone] or [phone]   [facility]  [place], [place] [phone]   [facility] [address], [place], [place] [phone] Insurance/Medicaid/sponsorship through [facility] and Families [address], [address]  [place], [place] [phone] [place], [place] [phone]    Dr. Adele Schilder  [phone]   [facility] Dept. 1) [address], [place] 2) [place] 3)  [place], [place] [phone]  [phone]   [place] [phone] or [phone] (After Hours)

## 2013-09-04 NOTE — ED Notes (Signed)
Patient transported to X-ray 

## 2013-09-04 NOTE — ED Notes (Signed)
PT st's she has had pain in left lower leg and ankle for over a month.  Pt denies any injury or fall.  Pt seen here for same on 1/5.

## 2013-09-07 NOTE — ED Provider Notes (Signed)
Medical screening examination/treatment/procedure(s) were performed by non-physician practitioner and as supervising physician I was immediately available for consultation/collaboration.  EKG Interpretation   None        [name], MD 09/07/13 1429 

## 2013-09-10 ENCOUNTER — Encounter (HOSPITAL_COMMUNITY): Payer: Self-pay | Admitting: Emergency Medicine

## 2013-09-10 ENCOUNTER — Emergency Department (INDEPENDENT_AMBULATORY_CARE_PROVIDER_SITE_OTHER)
Admission: EM | Admit: 2013-09-10 | Discharge: 2013-09-10 | Disposition: A | Discharge status: Home / Self Care | Payer: Medicare Other | Source: Home / Self Care

## 2013-09-10 DIAGNOSIS — M25579 Pain in unspecified ankle and joints of unspecified foot: Secondary | ICD-10-CM

## 2013-09-10 DIAGNOSIS — M25572 Pain in left ankle and joints of left foot: Secondary | ICD-10-CM

## 2013-09-10 MED ORDER — GABAPENTIN 300 MG PO CAPS: 300.0000 mg | ORAL_CAPSULE | Freq: Three times a day (TID) | ORAL | Status: DC

## 2013-09-10 NOTE — Discharge Instructions (Signed)
Follow up with your doctor; talk with your doctor if you need further pain medicine.    Ankle Pain Ankle pain is a common symptom. The bones, cartilage, tendons, and muscles of the ankle joint perform a lot of work each day. The ankle joint holds your body weight and allows you to move around. Ankle pain can occur on either side or back of 1 or both ankles. Ankle pain may be sharp and burning or dull and aching. There may be tenderness, stiffness, redness, or warmth around the ankle. The pain occurs more often when a person walks or puts pressure on the ankle. CAUSES  There are many reasons ankle pain can develop. It is important to work with your caregiver to identify the cause since many conditions can impact the bones, cartilage, muscles, and tendons. Causes for ankle pain include:  Injury, including a break (fracture), sprain, or strain often due to a fall, sports, or a high-impact activity.  Swelling (inflammation) of a tendon (tendonitis).  Achilles tendon rupture.  Ankle instability after repeated sprains and strains.  Poor foot alignment.  Pressure on a nerve (tarsal tunnel syndrome).  Arthritis in the ankle or the lining of the ankle.  Crystal formation in the ankle (gout or pseudogout). DIAGNOSIS  A diagnosis is based on your medical history, your symptoms, results of your physical exam, and results of diagnostic tests. Diagnostic tests may include X-ray exams or a computerized magnetic scan (magnetic resonance imaging, MRI). TREATMENT  Treatment will depend on the cause of your ankle pain and may include:  Keeping pressure off the ankle and limiting activities.  Using crutches or other walking support (a cane or brace).  Using rest, ice, compression, and elevation.  Participating in physical therapy or home exercises.  Wearing shoe inserts or special shoes.  Losing weight.  Taking medications to reduce pain or swelling or receiving an injection.  Undergoing  surgery. HOME CARE INSTRUCTIONS   Only take over-the-counter or prescription medicines for pain, discomfort, or fever as directed by your caregiver.  Put ice on the injured area.  Put ice in a plastic bag.  Place a towel between your skin and the bag.  Leave the ice on for 15-20 minutes at a time, 03-04 times a day.  Keep your leg raised (elevated) when possible to lessen swelling.  Avoid activities that cause ankle pain.  Follow specific exercises as directed by your caregiver.  Record how often you have ankle pain, the location of the pain, and what it feels like. This information may be helpful to you and your caregiver.  Ask your caregiver about returning to work or sports and whether you should drive.  Follow up with your caregiver for further examination, therapy, or testing as directed. SEEK MEDICAL CARE IF:   Pain or swelling continues or worsens beyond 1 week.  You have an oral temperature above 102 F (38.9 C).  You are feeling unwell or have chills.  You are having an increasingly difficult time with walking.  You have loss of sensation or other new symptoms.  You have questions or concerns. MAKE SURE YOU:   Understand these instructions.  Will watch your condition.  Will get help right away if you are not doing well or get worse. Document Released: 01/27/2010 Document Revised: 11/01/2011 Document Reviewed: 01/27/2010 [facility] Patient Information 2014 [place].

## 2013-09-10 NOTE — ED Notes (Signed)
C/o left ankle pain since. 1/2.   States pain is slightly above ankle.  Denies any known injury.  States " I had an x ray done and they told me nothing was wrong".  States pain is relieved when taking the oxycodone for severe pain.

## 2013-09-10 NOTE — ED Provider Notes (Signed)
CSN: [id number]     Arrival date & time 09/10/13  1748 History   None    Chief Complaint  Patient presents with  . Ankle Pain   (Consider location/radiation/quality/duration/timing/severity/associated sxs/prior Treatment) HPI Comments: Pt wonders if could have arthritis in L ankle like she has in L knee but her descriptions of the pain quality are different; she states her knee arthritis pain is achy and her ankle pain is sharp and shooting. Ibuprofen does not relieve pain at all but percocet relieves it. She has an appt with her pcp at [facility] on 1/26 to find out about seeing a "foot specialist". Pain in L ankle starts around lateral malleolus and radiates up laterally to mid calf; it also radiates around posteriorly to mid calf.   Patient is a 68 y.o. female presenting with ankle pain. The history is provided by the patient.  Ankle Pain Location:  Ankle Time since incident:  3 weeks Injury: no   Ankle location:  L ankle Pain details:    Quality:  Sharp   Radiates to:  L leg   Severity:  Moderate   Onset quality:  Unable to specify   Duration:  3 weeks   Timing:  Constant   Progression:  Unchanged Chronicity:  New Relieved by: percocet. Worsened by:  Bearing weight (palpation) Ineffective treatments:  NSAIDs, ice and heat Associated symptoms: no fever, no muscle weakness, no numbness, no swelling and no tingling     Past Medical History  Diagnosis Date  . Hypertension   . High cholesterol    History reviewed. No pertinent past surgical history. History reviewed. No pertinent family history. History  Substance Use Topics  . Smoking status: Never Smoker   . Smokeless tobacco: Not on file  . Alcohol Use: No   OB History   Grav Para Term Preterm Abortions TAB SAB Ect Mult Living                 Review of Systems  Constitutional: Negative for fever and chills.  Musculoskeletal:       L ankle pain  Neurological: Negative for weakness and numbness.     Allergies  Aspirin  Home Medications   Current Outpatient Rx  Name  Route  Sig  Dispense  Refill  . atenolol (TENORMIN) 25 MG tablet   Oral   Take 25 mg by mouth daily.         . hydrochlorothiazide (HYDRODIURIL) 25 MG tablet   Oral   Take 25 mg by mouth daily.         [name] ibuprofen (ADVIL,MOTRIN) 800 MG tablet   Oral   Take 1 tablet (800 mg total) by mouth 3 (three) times daily.   21 tablet   0   . lisinopril (PRINIVIL,ZESTRIL) 10 MG tablet   Oral   Take 10 mg by mouth daily.         [name] losartan (COZAAR) 25 MG tablet   Oral   Take 25 mg by mouth daily.         [name] oxyCODONE-acetaminophen (PERCOCET/ROXICET) 5-325 MG per tablet   Oral   Take 1 tablet by mouth every 4 (four) hours as needed for severe pain.   6 tablet   0   . gabapentin (NEURONTIN) 300 MG capsule   Oral   Take 1 capsule (300 mg total) by mouth 3 (three) times daily. Take 1 pill on day one. Take 1 pill BID day 2. Take 1 pill TID  thereafter   90 capsule   0   . oxyCODONE-acetaminophen (PERCOCET/ROXICET) 5-325 MG per tablet   Oral   Take 1-2 tablets by mouth every 4 (four) hours as needed for severe pain.   6 tablet   0    BP 164/79  Pulse 62  Temp(Src) 98.5 F (36.9 C) (Oral)  Resp 16  SpO2 100% Physical Exam  Constitutional: She appears well-developed and well-nourished. No distress.  Musculoskeletal:       Left ankle: She exhibits normal range of motion, no swelling, no deformity and normal pulse. Tenderness. Lateral malleolus tenderness found. Achilles tendon normal.       Feet:  Neurological: She has normal strength. No sensory deficit. Gait normal.  Pt reports increased pain sensation with light touch on lateral ankle/lower leg  Skin: Skin is warm and dry. No ecchymosis noted. No erythema.    ED Course  Procedures (including critical care time) Labs Review Labs Reviewed - No data to display Imaging Review No results found.  EKG Interpretation    Date/Time:     Ventricular Rate:    PR Interval:    QRS Duration:   QT Interval:    QTC Calculation:   R Axis:     Text Interpretation:              MDM   1. Ankle pain, left   Given pt;s pain description and the fact that ibuprofen doesn't help relieve it but percocet does, and given increased pain with light soft touch, I'm wondering if this could be a neuropathic pain. Discussed with pt and pt agrees to try neurontin. Rx neurontin 300mg  start once daily day1, BID day 3, then TID. #90. Pt to f/u with pcp for further help with this problem.     Carvel Getting, NP 09/10/13 [phone]

## 2013-09-11 NOTE — ED Provider Notes (Signed)
Medical screening examination/treatment/procedure(s) were performed by a resident physician or non-physician practitioner and as the supervising physician I was immediately available for consultation/collaboration.  Lynne Leader, MD    Gregor Hams, MD 09/11/13 [phone]

## 2014-05-02 ENCOUNTER — Other Ambulatory Visit: Payer: Self-pay

## 2014-05-02 DIAGNOSIS — Z1231 Encounter for screening mammogram for malignant neoplasm of breast: Secondary | ICD-10-CM

## 2014-05-16 ENCOUNTER — Ambulatory Visit
Admission: RE | Admit: 2014-05-16 | Discharge: 2014-05-16 | Disposition: A | Discharge status: Home / Self Care | Payer: Medicare Other | Source: Ambulatory Visit

## 2014-05-16 DIAGNOSIS — Z1231 Encounter for screening mammogram for malignant neoplasm of breast: Secondary | ICD-10-CM

## 2015-05-05 ENCOUNTER — Other Ambulatory Visit: Payer: Self-pay

## 2015-05-05 DIAGNOSIS — Z1231 Encounter for screening mammogram for malignant neoplasm of breast: Secondary | ICD-10-CM

## 2015-05-19 ENCOUNTER — Ambulatory Visit
Admission: RE | Admit: 2015-05-19 | Discharge: 2015-05-19 | Disposition: A | Discharge status: Home / Self Care | Payer: Medicare Other | Source: Ambulatory Visit

## 2015-05-19 DIAGNOSIS — Z1231 Encounter for screening mammogram for malignant neoplasm of breast: Secondary | ICD-10-CM

## 2015-05-20 ENCOUNTER — Other Ambulatory Visit: Payer: Self-pay | Admitting: Specialist

## 2015-05-20 DIAGNOSIS — R928 Other abnormal and inconclusive findings on diagnostic imaging of breast: Secondary | ICD-10-CM

## 2015-05-26 ENCOUNTER — Ambulatory Visit: Payer: Medicare Other

## 2015-06-09 ENCOUNTER — Other Ambulatory Visit: Payer: Self-pay | Admitting: Specialist

## 2015-06-09 ENCOUNTER — Ambulatory Visit
Admission: RE | Admit: 2015-06-09 | Discharge: 2015-06-09 | Disposition: A | Discharge status: Home / Self Care | Payer: Medicare Other | Source: Ambulatory Visit | Attending: Specialist | Admitting: Specialist

## 2015-06-09 DIAGNOSIS — R928 Other abnormal and inconclusive findings on diagnostic imaging of breast: Secondary | ICD-10-CM

## 2015-06-13 ENCOUNTER — Ambulatory Visit
Admission: RE | Admit: 2015-06-13 | Discharge: 2015-06-13 | Disposition: A | Discharge status: Home / Self Care | Payer: Medicare Other | Source: Ambulatory Visit | Attending: Specialist | Admitting: Specialist

## 2015-06-13 ENCOUNTER — Other Ambulatory Visit: Payer: Self-pay | Admitting: Specialist

## 2015-06-13 DIAGNOSIS — R928 Other abnormal and inconclusive findings on diagnostic imaging of breast: Secondary | ICD-10-CM

## 2015-06-19 ENCOUNTER — Inpatient Hospital Stay: Admission: RE | Admit: 2015-06-19 | Payer: Medicare Other | Source: Ambulatory Visit

## 2015-06-23 ENCOUNTER — Telehealth: Payer: Self-pay | Admitting: *Deleted

## 2015-06-23 ENCOUNTER — Encounter: Payer: Self-pay | Admitting: *Deleted

## 2015-06-23 DIAGNOSIS — C50411 Malignant neoplasm of upper-outer quadrant of right female breast: Secondary | ICD-10-CM

## 2015-06-23 DIAGNOSIS — Z170 Estrogen receptor positive status [ER+]: Secondary | ICD-10-CM

## 2015-06-23 HISTORY — DX: Malignant neoplasm of upper-outer quadrant of right female breast: C50.411

## 2015-06-23 NOTE — Telephone Encounter (Signed)
Confirmed [facility] for 07/02/15 at 1230 .  Instructions and contact information given.

## 2015-07-02 ENCOUNTER — Other Ambulatory Visit: Payer: Self-pay | Admitting: *Deleted

## 2015-07-02 ENCOUNTER — Ambulatory Visit: Payer: Medicare Other | Attending: Surgery | Admitting: Physical Therapy

## 2015-07-02 ENCOUNTER — Encounter: Payer: Self-pay | Admitting: Oncology

## 2015-07-02 ENCOUNTER — Other Ambulatory Visit (HOSPITAL_BASED_OUTPATIENT_CLINIC_OR_DEPARTMENT_OTHER): Payer: Medicare Other

## 2015-07-02 ENCOUNTER — Ambulatory Visit (HOSPITAL_BASED_OUTPATIENT_CLINIC_OR_DEPARTMENT_OTHER): Payer: Medicare Other | Admitting: Oncology

## 2015-07-02 ENCOUNTER — Telehealth: Payer: Self-pay | Admitting: *Deleted

## 2015-07-02 ENCOUNTER — Ambulatory Visit
Admission: RE | Admit: 2015-07-02 | Discharge: 2015-07-02 | Disposition: A | Discharge status: Home / Self Care | Payer: Medicare Other | Source: Ambulatory Visit | Attending: Radiation Oncology | Admitting: Radiation Oncology

## 2015-07-02 ENCOUNTER — Encounter: Payer: Self-pay | Admitting: Physical Therapy

## 2015-07-02 ENCOUNTER — Ambulatory Visit: Payer: Self-pay | Admitting: Surgery

## 2015-07-02 VITALS — BP 123/67 | HR 71 | Temp 98.4°F | Resp 20 | Ht 66.0 in | Wt 172.5 lb

## 2015-07-02 DIAGNOSIS — Z170 Estrogen receptor positive status [ER+]: Secondary | ICD-10-CM | POA: Diagnosis not present

## 2015-07-02 DIAGNOSIS — C50411 Malignant neoplasm of upper-outer quadrant of right female breast: Secondary | ICD-10-CM

## 2015-07-02 DIAGNOSIS — C50211 Malignant neoplasm of upper-inner quadrant of right female breast: Secondary | ICD-10-CM

## 2015-07-02 DIAGNOSIS — R293 Abnormal posture: Secondary | ICD-10-CM | POA: Insufficient documentation

## 2015-07-02 LAB — CBC WITH DIFFERENTIAL/PLATELET
BASO%: 0.9 % (ref 0.0–2.0)
Basophils Absolute: 0 10*3/uL (ref 0.0–0.1)
EOS%: 1.3 % (ref 0.0–7.0)
Eosinophils Absolute: 0.1 10*3/uL (ref 0.0–0.5)
HCT: 33.5 % — ABNORMAL LOW (ref 34.8–46.6)
HGB: 11 g/dL — ABNORMAL LOW (ref 11.6–15.9)
LYMPH%: 40.7 % (ref 14.0–49.7)
MCH: 26.5 pg (ref 25.1–34.0)
MCHC: 32.7 g/dL (ref 31.5–36.0)
MCV: 80.9 fL (ref 79.5–101.0)
MONO#: 0.4 10*3/uL (ref 0.1–0.9)
MONO%: 7.9 % (ref 0.0–14.0)
NEUT#: 2.7 10*3/uL (ref 1.5–6.5)
NEUT%: 49.2 % (ref 38.4–76.8)
Platelets: 254 10*3/uL (ref 145–400)
RBC: 4.14 10*6/uL (ref 3.70–5.45)
RDW: 13.5 % (ref 11.2–14.5)
WBC: 5.6 10*3/uL (ref 3.9–10.3)
lymph#: 2.3 10*3/uL (ref 0.9–3.3)

## 2015-07-02 LAB — COMPREHENSIVE METABOLIC PANEL (CC13)
ALT: 13 U/L (ref 0–55)
AST: 18 U/L (ref 5–34)
Albumin: 4 g/dL (ref 3.5–5.0)
Alkaline Phosphatase: 108 U/L (ref 40–150)
Anion Gap: 9 mEq/L (ref 3–11)
BUN: 26 mg/dL (ref 7.0–26.0)
CHLORIDE: 108 meq/L (ref 98–109)
CO2: 24 mEq/L (ref 22–29)
Calcium: 10.3 mg/dL (ref 8.4–10.4)
Creatinine: 1.4 mg/dL — ABNORMAL HIGH (ref 0.6–1.1)
EGFR: 46 mL/min/{1.73_m2} — ABNORMAL LOW (ref >90)
Glucose: 111 mg/dl (ref 70–140)
POTASSIUM: 5.3 meq/L — AB (ref 3.5–5.1)
SODIUM: 141 meq/L (ref 136–145)
Total Bilirubin: 0.48 mg/dL (ref 0.20–1.20)
Total Protein: 7.6 g/dL (ref 6.4–8.3)

## 2015-07-02 NOTE — Progress Notes (Signed)
[facility]  Telephone:([phone]) [phone] Fax:(336) [phone]     ID: Christina [name] DOB: 1946-06-07  MR#: [id number]  OZD#:[id number]  Patient Care Team: Fabian Sharp, MD as PCP - General (Physical Medicine and Rehabilitation) Chauncey Cruel, MD as Consulting Physician (Oncology) Eppie Gibson, MD as Attending Physician (Radiation Oncology) Erroll Luna, MD as Consulting Physician (General Surgery) Lillia Corporal, MD as Consulting Physician (Specialist) PCP: Fabian Sharp, MD GYN: OTHER MD:  CHIEF COMPLAINT: Estrogen receptor positive breast cancer  CURRENT TREATMENT: Awaiting definitive surgery   BREAST CANCER HISTORY: Christina [name] had screening mammography (not available for review today) showing a possible mass in the right breast. On 06/09/2015 she underwent unilateral right mammography with tomosynthesis at the breast Center. This found the breast density to be category D. The area of distortion seen on the 2-D screening did not persist, but there was a different area of distortion in the outer right breast above the nipple. This was palpable at the 9:00 position 3 cm from the nipple. Ultrasound showed 2 adjacent irregular hypoechoic masses, the larger containing internal echogenic foci. This measured 1.9 cm. 5 mm lateral to this the second mass measured 1.2 cm maximally. Taken together the 2 masses measured 3.3 cm maximally.  Biopsy of the 2 masses in question 06/13/2015 showed ([id number]) identical invasive ductal carcinomas, estrogen receptor 100% positive, progesterone receptor 100% positive, both with strong staining intensity, with an MIB-1 of 5%, and no HER-2 amplification, the signals ratio being 1.15 and the number per cell 1.95.  The patient's subsequent history is as detailed below  INTERVAL HISTORY: Christina [name] was evaluated in the multidisciplinary breast cancer clinic 07/02/2015 accompanied by her daughter Christina [name]. Her case was also presented in the  multidisciplinary breast cancer conference that same morning. At that time a preliminary plan was proposed: Breast MRI, followed by breast conserving surgery with sentinel lymph node sampling, and Oncotype. Radiation would be appropriate if the patient opts for breast conserving surgery  REVIEW OF SYSTEMS: There were no specific symptoms leading to the original mammogram, which was routinely scheduled. [name] does have a left exotropia, which she tells me has been present since childhood. She denies double vision. The patient denies unusual headaches, visual changes, nausea, vomiting, stiff neck, dizziness, or gait imbalance. There has been no cough, phlegm production, or pleurisy, no chest pain or pressure, and no change in bowel or bladder habits. The patient denies fever, rash, bleeding, unexplained fatigue or unexplained weight loss. A detailed review of systems was otherwise entirely negative.  PAST MEDICAL HISTORY: Past Medical History  Diagnosis Date  . Hypertension   . High cholesterol   . Breast cancer of upper-outer quadrant of right female breast ([place]) 06/23/2015  . Breast cancer ([place])   . Anemia     PAST SURGICAL HISTORY: History reviewed. No pertinent past surgical history.  FAMILY HISTORY History reviewed. No pertinent family history. The patient's parents died when she was very young. She thinks her father may have died from "high blood pressure", and her mother from cancer, possibly of the uterus. He is alive 3 brothers, 2 sisters. One brother died with lung cancer. He had a history of tobacco abuse. One other brother and one sister have died from complications of diabetes  GYNECOLOGIC HISTORY:  No LMP recorded. Patient has had a hysterectomy. Menarche age 89, first live birth age 28, the patient is [place] P2. She does not recall exactly when she stopped having periods. She never took hormone replacement. She never  used oral contraceptives.  SOCIAL HISTORY:  Christina [name] works in the  [employer]. At home she lives with her daughter Christina [name] who works in [employer], and son Christina [name], who is unemployed and has a history of schizophrenia.    ADVANCED DIRECTIVES: Not in place. At the initial clinic visit 07/04/2015 the patient was given the appropriate forms to complete and notarize at her discretion.  HEALTH MAINTENANCE: Social History  Substance Use Topics  . Smoking status: Never Smoker   . Smokeless tobacco: None  . Alcohol Use: No     Colonoscopy: never  PAP:  Bone density: remote?  Lipid panel:  Allergies  Allergen Reactions  . Aspirin Nausea Only    Current Outpatient Prescriptions  Medication Sig Dispense Refill  . atenolol (TENORMIN) 25 MG tablet Take 25 mg by mouth daily.    [name] gabapentin (NEURONTIN) 300 MG capsule Take 1 capsule (300 mg total) by mouth 3 (three) times daily. Take 1 pill on day one. Take 1 pill BID day 2. Take 1 pill TID thereafter 90 capsule 0  . hydrochlorothiazide (HYDRODIURIL) 25 MG tablet Take 25 mg by mouth daily.    [name] ibuprofen (ADVIL,MOTRIN) 800 MG tablet Take 1 tablet (800 mg total) by mouth 3 (three) times daily. 21 tablet 0  . lisinopril (PRINIVIL,ZESTRIL) 10 MG tablet Take 10 mg by mouth daily.    [name] losartan (COZAAR) 25 MG tablet Take 25 mg by mouth daily.    [name] oxyCODONE-acetaminophen (PERCOCET/ROXICET) 5-325 MG per tablet Take 1 tablet by mouth every 4 (four) hours as needed for severe pain. 6 tablet 0  . oxyCODONE-acetaminophen (PERCOCET/ROXICET) 5-325 MG per tablet Take 1-2 tablets by mouth every 4 (four) hours as needed for severe pain. 6 tablet 0   No current facility-administered medications for this visit.    OBJECTIVE: Light skinned African-American woman who appears stated age 16 Vitals:   07/02/15 1349  BP: 123/67  Pulse: 71  Temp: 98.4 F (36.9 C)  Resp: 20     Body mass index is 27.86 kg/(m^2).    ECOG FS:0 - Asymptomatic  Ocular: Sclerae unicteric,  left exotropia, with disconjugate eye movements, but no blindness or double vision Ear-nose-throat: Oropharynx clear and moist Lymphatic: No cervical or supraclavicular adenopathy Lungs no rales or rhonchi, good excursion bilaterally Heart regular rate and rhythm, no murmur appreciated Abd soft, nontender, positive bowel sounds MSK no focal spinal tenderness, no joint edema Neuro: non-focal, well-oriented, appropriate affect Breasts: The right breast is status post recent biopsy. I do not palpate a mass. There are no skin or nipple changes of concern. The right axilla is benign. The left breast is unremarkable.   LAB RESULTS:  CMP     Component Value Date/Time   NA 141 07/02/2015 1331   NA 141 04/08/2010 1725   K 5.3* 07/02/2015 1331   K 4.1 04/08/2010 1725   CL 105 04/08/2010 1725   CO2 24 07/02/2015 1331   CO2 27 04/01/2010 0000   GLUCOSE 111 07/02/2015 1331   GLUCOSE 106* 04/08/2010 1725   BUN 26.0 07/02/2015 1331   BUN 19 04/08/2010 1725   CREATININE 1.4* 07/02/2015 1331   CREATININE 1.0 04/08/2010 1725   CALCIUM 10.3 07/02/2015 1331   CALCIUM 9.9 04/01/2010 0000   PROT 7.6 07/02/2015 1331   PROT 7.7 04/01/2010 0000   ALBUMIN 4.0 07/02/2015 1331   ALBUMIN 4.4 04/01/2010 0000   AST 18 07/02/2015 1331   AST  17 04/01/2010 0000   ALT 13 07/02/2015 1331   ALT 15 04/01/2010 0000   ALKPHOS 108 07/02/2015 1331   ALKPHOS 98 04/01/2010 0000   BILITOT 0.48 07/02/2015 1331   BILITOT 0.5 04/01/2010 0000    INo results found for: SPEP, UPEP  Lab Results  Component Value Date   WBC 5.6 07/02/2015   NEUTROABS 2.7 07/02/2015   HGB 11.0* 07/02/2015   HCT 33.5* 07/02/2015   MCV 80.9 07/02/2015   PLT 254 07/02/2015      Chemistry      Component Value Date/Time   NA 141 07/02/2015 1331   NA 141 04/08/2010 1725   K 5.3* 07/02/2015 1331   K 4.1 04/08/2010 1725   CL 105 04/08/2010 1725   CO2 24 07/02/2015 1331   CO2 27 04/01/2010 0000   BUN 26.0 07/02/2015 1331   BUN  19 04/08/2010 1725   CREATININE 1.4* 07/02/2015 1331   CREATININE 1.0 04/08/2010 1725      Component Value Date/Time   CALCIUM 10.3 07/02/2015 1331   CALCIUM 9.9 04/01/2010 0000   ALKPHOS 108 07/02/2015 1331   ALKPHOS 98 04/01/2010 0000   AST 18 07/02/2015 1331   AST 17 04/01/2010 0000   ALT 13 07/02/2015 1331   ALT 15 04/01/2010 0000   BILITOT 0.48 07/02/2015 1331   BILITOT 0.5 04/01/2010 0000       No results found for: LABCA2  No components found for: EQAST419  No results for input(s): INR in the last 168 hours.  Urinalysis    Component Value Date/Time   COLORURINE yellow 04/01/2010 0000   APPEARANCEUR Clear 04/01/2010 0000   LABSPEC 1.010 04/01/2010 0000   PHURINE 7.5 04/01/2010 0000   HGBUR negative 04/01/2010 0000   BILIRUBINUR negative 04/01/2010 0000   UROBILINOGEN 0.2 04/01/2010 0000   NITRITE negative 04/01/2010 0000    STUDIES: Mm Digital Diagnostic Unilat R  06/13/2015  CLINICAL DATA:  Post ultrasound-guided biopsy of 2 suspicious masses in the right breast and 9 o'clock. EXAM: DIAGNOSTIC RIGHT MAMMOGRAM POST ULTRASOUND BIOPSY COMPARISON:  Previous exam(s). FINDINGS: Mammographic images were obtained following ultrasound guided biopsies of 2 masses in the right breast at 9 o'clock. Site 1: A ribbon shaped biopsy marking clip is present in the slightly larger mass in the right breast at the 9 o'clock position. Site 2: A coil shaped biopsy marking clip is present in the slightly smaller more superficial mass in the right breast at the 9 o'clock position. IMPRESSION: Appropriate biopsy marker clip position as stated above. Final Assessment: Post Procedure Mammograms for Marker Placement Electronically Signed   By: Everlean Alstrom M.D.   On: 06/13/2015 16:28   US Breast Ltd Uni Right Inc Axilla  06/09/2015  CLINICAL DATA:  Possible mass right breast identified on recent 2D screening mammogram. The patient is asymptomatic. EXAM: DIGITAL DIAGNOSTIC RIGHT MAMMOGRAM  WITH 3D TOMOSYNTHESIS WITH CAD ULTRASOUND RIGHT BREAST COMPARISON:  05/19/2015 and prior mammograms dating back to 04/10/2009 ACR Breast Density Category d: The breast tissue is extremely dense, which lowers the sensitivity of mammography. FINDINGS: CC and MLO whole breast tomographic images are performed. There is an area of distortion in the outer right breast, seen above the level of the nipple on the MLO projection. In the CC projection, this area of concern is separate from the area in question on the 2D screening mammogram. I do not see a suspicious persistent mass in the medial right breast in the CC projection. In the [facility]  view, the area of distortion is in the region of the questioned mass on the recent mammogram. Mammographic images were processed with CAD. On physical exam, I do palpate slight nodularity in the 9 o'clock position of the right breast centered 3 cm from the nipple. Targeted ultrasound is performed, showing 2 adjacent irregular hypoechoic masses at 9 o'clock position 3-4 cm from the nipple. The larger of the masses contain some internal echogenic foci consistent with calcifications. The largest of these masses measures approximately 1.9 x 1.4 x 1.8 cm and contains prominent internal vascular flow and internal calcifications. Approximately 5 mm directly lateral to this mass is a second smaller irregular mass measuring 1.2 x 0.9 x 1.1 cm. There is prominent vascular flow within this mass as well as some internal calcifications. In total, the 2 masses span at least 3.3 cm. Ultrasound of the right axilla is negative. No lymphadenopathy is identified. IMPRESSION: 3D tomographic images show an area of distortion in the outer right breast that was not apparent on the 2D images. On ultrasound, there are 2 adjacent suspicious irregular masses in the 9 o'clock position, that in total span at least 3.3 cm, as described above. Findings are worrisome for malignancy. RECOMMENDATION: Ultrasound-guided  biopsies of both of these suspicious masses in the 9 o'clock region of the right breast are recommended. This has been scheduled for the patient on 06/13/2015 at 3 o\'clock p.m. I have discussed the findings and recommendations with the patient. Results were also provided in writing at the conclusion of the visit. If applicable, a reminder letter will be sent to the patient regarding the next appointment. BI-RADS CATEGORY  5: Highly suggestive of malignancy. Electronically Signed   By: Curlene Dolphin M.D.   On: 06/09/2015 12:25   Mm Diag Breast Tomo Uni Right  06/09/2015  CLINICAL DATA:  Possible mass right breast identified on recent 2D screening mammogram. The patient is asymptomatic. EXAM: DIGITAL DIAGNOSTIC RIGHT MAMMOGRAM WITH 3D TOMOSYNTHESIS WITH CAD ULTRASOUND RIGHT BREAST COMPARISON:  05/19/2015 and prior mammograms dating back to 04/10/2009 ACR Breast Density Category d: The breast tissue is extremely dense, which lowers the sensitivity of mammography. FINDINGS: CC and MLO whole breast tomographic images are performed. There is an area of distortion in the outer right breast, seen above the level of the nipple on the MLO projection. In the CC projection, this area of concern is separate from the area in question on the 2D screening mammogram. I do not see a suspicious persistent mass in the medial right breast in the CC projection. In the MLO view, the area of distortion is in the region of the questioned mass on the recent mammogram. Mammographic images were processed with CAD. On physical exam, I do palpate slight nodularity in the 9 o'clock position of the right breast centered 3 cm from the nipple. Targeted ultrasound is performed, showing 2 adjacent irregular hypoechoic masses at 9 o'clock position 3-4 cm from the nipple. The larger of the masses contain some internal echogenic foci consistent with calcifications. The largest of these masses measures approximately 1.9 x 1.4 x 1.8 cm and contains  prominent internal vascular flow and internal calcifications. Approximately 5 mm directly lateral to this mass is a second smaller irregular mass measuring 1.2 x 0.9 x 1.1 cm. There is prominent vascular flow within this mass as well as some internal calcifications. In total, the 2 masses span at least 3.3 cm. Ultrasound of the right axilla is negative. No lymphadenopathy is identified. IMPRESSION: 3D  tomographic images show an area of distortion in the outer right breast that was not apparent on the 2D images. On ultrasound, there are 2 adjacent suspicious irregular masses in the 9 o'clock position, that in total span at least 3.3 cm, as described above. Findings are worrisome for malignancy. RECOMMENDATION: Ultrasound-guided biopsies of both of these suspicious masses in the 9 o'clock region of the right breast are recommended. This has been scheduled for the patient on 06/13/2015 at 3 o\'clock p.m. I have discussed the findings and recommendations with the patient. Results were also provided in writing at the conclusion of the visit. If applicable, a reminder letter will be sent to the patient regarding the next appointment. BI-RADS CATEGORY  5: Highly suggestive of malignancy. Electronically Signed   By: Curlene Dolphin M.D.   On: 06/09/2015 12:25   [place] Rt Breast Bx W Loc Dev 1st Lesion Img Bx Spec US Guide  06/24/2015  ADDENDUM REPORT: 06/23/2015 15:03 ADDENDUM: Pathology revealed invasive ductal carcinoma and ductal carcinoma in situ in two masses in the right breast at 9:00. This was found to be concordant by Dr. Everlean Alstrom. Daily attempts have made to contact the patient since the results were available on June 17, 2015. A message was left for her physician, Dr. Lillia Corporal, today, June 23, 2015. The patient called in today, June 23, 2015, for biopsy results. She reported doing well after the biopsies. Post biopsy instructions and care were reviewed and her questions were answered. She has  been scheduled at [facility] on July 02, 2015. My number was provided for additional questions and concerns. Pathology results reported by Susa Raring RN, BSN on June 23, 2015. Electronically Signed   By: Everlean Alstrom M.D.   On: 06/23/2015 15:03  06/24/2015  CLINICAL DATA:  69 year old female with 2 highly suspicious masses in the outer right breast at 9 o'clock. EXAM: ULTRASOUND GUIDED RIGHT BREAST CORE NEEDLE BIOPSY COMPARISON:  Previous exam(s). FINDINGS: I met with the patient and we discussed the procedure of ultrasound-guided biopsy, including benefits and alternatives. We discussed the high likelihood of a successful procedure. We discussed the risks of the procedure, including infection, bleeding, tissue injury, clip migration, and inadequate sampling. Informed written consent was given. The usual time-out protocol was performed immediately prior to the procedure. Site 1: Using sterile technique and 2% Lidocaine as local anesthetic, under direct ultrasound visualization, a 12 gauge spring-loaded device was used to perform biopsy of the larger mass in the right breast at 9 o'clock using a lateral to medial approach. At the conclusion of the procedure a ribbon shaped tissue marker clip was deployed into the biopsy cavity. Site 2: Using sterile technique and 2% Lidocaine as local anesthetic, under direct ultrasound visualization, a 12 gauge spring-loaded device was used to perform biopsy of the slightly smaller and more superficial mass in the right breast at 9 o'clock using a lateral to medial approach. At the conclusion of the procedure a coil shaped tissue marker clip was deployed into the biopsy cavity. Follow up 2 view mammogram was performed and dictated separately. IMPRESSION: 1. Ultrasound-guided biopsy of a suspicious mass in the right breast at 9 o'clock (site 1: Slightly larger mass at site of a ribbon clip). 2. Ultrasound-guided biopsy of a  suspicious mass in the right breast at 9 o'clock (site 2: Slightly smaller more superficial mass at site of a coil clip). Electronically Signed: By: Everlean Alstrom M.D. On: 06/13/2015 16:09  [place] Rt Breast Bx W Loc Dev Ea Add Lesion Img Bx Spec US Guide  06/24/2015  ADDENDUM REPORT: 06/23/2015 15:03 ADDENDUM: Pathology revealed invasive ductal carcinoma and ductal carcinoma in situ in two masses in the right breast at 9:00. This was found to be concordant by Dr. Everlean Alstrom. Daily attempts have made to contact the patient since the results were available on June 17, 2015. A message was left for her physician, Dr. Lillia Corporal, today, June 23, 2015. The patient called in today, June 23, 2015, for biopsy results. She reported doing well after the biopsies. Post biopsy instructions and care were reviewed and her questions were answered. She has been scheduled at [facility] on July 02, 2015. My number was provided for additional questions and concerns. Pathology results reported by Susa Raring RN, BSN on June 23, 2015. Electronically Signed   By: Everlean Alstrom M.D.   On: 06/23/2015 15:03  06/24/2015  CLINICAL DATA:  69 year old female with 2 highly suspicious masses in the outer right breast at 9 o'clock. EXAM: ULTRASOUND GUIDED RIGHT BREAST CORE NEEDLE BIOPSY COMPARISON:  Previous exam(s). FINDINGS: I met with the patient and we discussed the procedure of ultrasound-guided biopsy, including benefits and alternatives. We discussed the high likelihood of a successful procedure. We discussed the risks of the procedure, including infection, bleeding, tissue injury, clip migration, and inadequate sampling. Informed written consent was given. The usual time-out protocol was performed immediately prior to the procedure. Site 1: Using sterile technique and 2% Lidocaine as local anesthetic, under direct ultrasound visualization, a 12 gauge spring-loaded  device was used to perform biopsy of the larger mass in the right breast at 9 o'clock using a lateral to medial approach. At the conclusion of the procedure a ribbon shaped tissue marker clip was deployed into the biopsy cavity. Site 2: Using sterile technique and 2% Lidocaine as local anesthetic, under direct ultrasound visualization, a 12 gauge spring-loaded device was used to perform biopsy of the slightly smaller and more superficial mass in the right breast at 9 o'clock using a lateral to medial approach. At the conclusion of the procedure a coil shaped tissue marker clip was deployed into the biopsy cavity. Follow up 2 view mammogram was performed and dictated separately. IMPRESSION: 1. Ultrasound-guided biopsy of a suspicious mass in the right breast at 9 o'clock (site 1: Slightly larger mass at site of a ribbon clip). 2. Ultrasound-guided biopsy of a suspicious mass in the right breast at 9 o'clock (site 2: Slightly smaller more superficial mass at site of a coil clip). Electronically Signed: By: Everlean Alstrom M.D. On: 06/13/2015 16:09    ASSESSMENT: 69 y.o. [place] woman status post right breast biopsy 2 on 06/13/2015 for morphologically identical invasive ductal carcinomas measuring 1.9 and 1.2 cm, grade 2, estrogen and progesterone receptor strongly positive, HER-2 not amplified, with an MIB-1 of 5%  (1) definitive surgery pending  (2) Oncotype to be obtained from surgical sample  (3) adjuvant radiation to follow surgery  (4) anti-estrogens to start at the completion of local treatment  PLAN: We spent the better part of today's hour-long appointment discussing the biology of breast cancer in general, and the specifics of the patient's tumor in particular. She understands the difference between invasive and noninvasive disease, and we went over the pathology results in detail.   She understands she has 2 adjacent early-stage breast cancers, which likely can be removed through a  single lumpectomy site. Because this is  a slow-growing small tumor and at least clinically node negative, we are planning to request an Oncotype from that sample.  If the Oncotype is low or intermediate risk we would not proceed to chemotherapy, but of course if it is high risk we would recommend chemotherapy in that case.  My expectation is that she will not need chemotherapy but will proceed directly to radiation. Accordingly I am making her a return appointment with me for approximately 3 months from now by which time I expect all her local treatment to be completed.  Erendida has a good understanding of the overall plan. She agrees with it. She knows the goal of treatment in her case is cure. She will call with any problems that may develop before her next visit here.  Chauncey Cruel, MD   07/02/2015 2:36 PM Medical Oncology and Hematology [facility] [address] [place], [place] [address] Tel. [phone]    Fax. [phone]

## 2015-07-02 NOTE — Patient Instructions (Signed)

## 2015-07-02 NOTE — Therapy (Signed)
[place], [place], [address] Phone: [phone]   Fax:  [phone]  Physical Therapy Evaluation  Patient Details  Name: Christina [name] MRN: [id number] Date of Birth: [birth year] Referring Provider: Dr. Erroll Luna  Encounter Date: 07/02/2015      PT End of Session - 07/02/15 1643    Visit Number 1   Number of Visits 1   PT Start Time 0388   PT Stop Time 1552   PT Time Calculation (min) 29 min   Activity Tolerance Patient tolerated treatment well   Behavior During Therapy [facility] for tasks assessed/performed      Past Medical History  Diagnosis Date  . Hypertension   . High cholesterol   . Breast cancer of upper-outer quadrant of right female breast ([place]) 06/23/2015  . Breast cancer ([place])   . Anemia     History reviewed. No pertinent past surgical history.  There were no vitals filed for this visit.  Visit Diagnosis:  Carcinoma of upper-outer quadrant of right female breast [facility]) - Plan: PT plan of care cert/re-cert  Abnormal posture - Plan: PT plan of care cert/re-cert      Subjective Assessment - 07/02/15 1633    Subjective Patient was seen today for a baseline assessment of her newly diagnosed right breast cancer.   Patient is accompained by: Family member   Pertinent History Patient was diagnosed on 05/19/15 with right ER/PR positive, HER2 negative breast cancer located at the 9:00 position.  There are 2 areas 4.1 cm apart measuring 1.9 cm and 1.2 cm.   Patient Stated Goals reduce lymphedema risk and learn post op shoulder ROM HEP   Currently in Pain? No/denies            [facility] PT Assessment - 07/02/15 0001    Assessment   Medical Diagnosis Right breast cancer   Referring Provider Dr. [name] [place]   Onset Date/Surgical Date 05/19/15   Hand Dominance Right   Prior Therapy none   Precautions   Precautions Other (comment)  Active breast cancer   Restrictions   Weight  Bearing Restrictions No   Balance Screen   Has the patient fallen in the past 6 months No   Has the patient had a decrease in activity level because of a fear of falling?  No   Is the patient reluctant to leave their home because of a fear of falling?  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   Leisure She walks 1 hour once a week   Cognition   Overall Cognitive Status Within Functional Limits for tasks assessed   Posture/Postural Control   Posture/Postural Control Postural limitations   Postural Limitations Rounded Shoulders;Forward head   ROM / Strength   AROM / PROM / Strength AROM;Strength   AROM   AROM Assessment Site Shoulder   Right/Left Shoulder Right;Left   Right Shoulder Extension 50 Degrees   Right Shoulder Flexion 128 Degrees   Right Shoulder ABduction 141 Degrees   Right Shoulder Internal Rotation 60 Degrees   Right Shoulder External Rotation 73 Degrees   Left Shoulder Extension 60 Degrees   Left Shoulder Flexion 139 Degrees   Left Shoulder  ABduction 135 Degrees   Left Shoulder Internal Rotation 82 Degrees   Left Shoulder External Rotation 70 Degrees   Strength   Overall Strength Within functional limits for tasks performed           LYMPHEDEMA/ONCOLOGY QUESTIONNAIRE - 07/02/15 1641    Type   Cancer Type Right breast cancer   Lymphedema Assessments   Lymphedema Assessments Upper extremities   Right Upper Extremity Lymphedema   10 cm Proximal to Olecranon Process 33 cm   Olecranon Process 27.5 cm   10 cm Proximal to Ulnar Styloid Process 23.8 cm   Just Proximal to Ulnar Styloid Process 17.4 cm   Across Hand at [employer] 20.4 cm   At [place] of 2nd Digit 6.5 cm   Left Upper  Extremity Lymphedema   10 cm Proximal to Olecranon Process 32 cm   Olecranon Process 28.4 cm   10 cm Proximal to Ulnar Styloid Process 23 cm   Just Proximal to Ulnar Styloid Process 17.6 cm   Across Hand at [employer] 19.8 cm   At [place] of 2nd Digit 6.4 cm      Patient was instructed today in a home exercise program today for post op shoulder range of motion. These included active assist shoulder flexion in sitting, scapular retraction, wall walking with shoulder abduction, and hands behind head external rotation.  She was encouraged to do these twice a day, holding 3 seconds and repeating 5 times when permitted by her physician.           PT Education - 07/02/15 1643    Education provided Yes   Education Details Lymphedema risk reduction and post op shoulder ROM HEP   Person(s) Educated Patient   Methods Explanation;Demonstration;Handout   Comprehension Returned demonstration;Verbalized understanding              Breast Clinic Goals - 07/02/15 1646    Patient will be able to verbalize understanding of pertinent lymphedema risk reduction practices relevant to her diagnosis specifically related to skin care.   Time 1   Period Days   Status Achieved   Patient will be able to return demonstrate and/or verbalize understanding of the post-op home exercise program related to regaining shoulder range of motion.   Time 1   Period Days   Status Achieved   Patient will be able to verbalize understanding of the importance of attending the postoperative After Breast Cancer Class for further lymphedema risk reduction education and therapeutic exercise.   Time 1   Period Days   Status Achieved              Plan - 07/02/15 1644    Clinical Impression Statement Patient was diagnosed on 05/19/15 with right ER/PR positive, HER2 negative breast cancer located at the 9:00 position.  There are 2 areas 4.1 cm apart measuring 1.9 cm and 1.2 cm.  She is planning to have a right  lumpectomy and sentinel node biopsy followed by Oncotype testing, radiation and anti-estrogen therapy.  She may benefit from post op PT to regain shoulder ROM and strength.   Pt will benefit from skilled therapeutic intervention in order to improve on the following deficits Decreased strength;Pain;Decreased knowledge of precautions;Impaired UE functional use;Decreased range of motion   Rehab Potential Excellent   Clinical Impairments Affecting Rehab Potential none   PT Frequency One time visit   PT Treatment/Interventions Therapeutic exercise;Patient/family education   Consulted and Agree with Plan of Care Patient;Family member/caregiver  Family Member Consulted Daughter     Patient will follow up at outpatient cancer rehab if needed following surgery.  If the patient requires physical therapy at that time, a specific plan will be dictated and sent to the referring physician for approval. The patient was educated today on appropriate basic range of motion exercises to begin post operatively and the importance of attending the After Breast Cancer class following surgery.  Patient was educated today on lymphedema risk reduction practices as it pertains to recommendations that will benefit the patient immediately following surgery.  She verbalized good understanding.  No additional physical therapy is indicated at this time.         G-Codes - 07/10/15 1647    Functional Assessment Tool Used Clinical Judgement   Functional Limitation Other PT primary   Other PT Primary Current Status (G3151) At least 1 percent but less than 20 percent impaired, limited or restricted   Other PT Primary Goal Status (V6160) At least 1 percent but less than 20 percent impaired, limited or restricted   Other PT Primary Discharge Status (V3710) At least 1 percent but less than 20 percent impaired, limited or restricted       Problem List Patient Active Problem List   Diagnosis Date Noted  . Breast cancer of  upper-outer quadrant of right female breast ([place]) 06/23/2015  . CONSTIPATION 04/01/2010  . PALPITATIONS 04/01/2010  . URINALYSIS, ABNORMAL 04/01/2010  . ANEMIA 05/22/2009  . DENTAL CARIES 04/21/2009  . HYPERLIPIDEMIA 03/18/2008  . HYPERTENSION 08/23/2006   Annia Friendly, PT July 10, 2015 4:49 PM  [place], [place], [address] Phone: [phone]   Fax:  [phone]  Name: Christina [name] MRN: [id number] Date of Birth: 1946-05-25

## 2015-07-02 NOTE — Progress Notes (Signed)
Radiation Oncology         [phone]) [phone] ________________________________  Initial outpatient Consultation  Name: Christina [name] MRN: [id number]  Date: 07/02/2015  DOB: 05-27-1946  FM:BWGYKZ, HEATHER, MD  No ref. provider found   REFERRING PHYSICIAN: [name] Cornett  DIAGNOSIS:    ICD-9-CM ICD-10-CM   1. Breast cancer of upper-outer quadrant of right female breast (HCC) 174.4 C50.411    Stage I [id number] Multifocal Right Breast UOQ Invasive Ductal Carcinoma, ER+ / PR+ / Her2neg, Grade 2  HISTORY OF PRESENT ILLNESS::Christina [name] is a 69 y.o. female who presented with medial right breast asymmetry on screening mammogram. 3D mag views did not reveal medial lesions, but actually revealed outer right breast distortion; on [place] two lesions were at the 9:00 position. The lesions were 1.9 cm and 1.2 cm.  Biopsy of both lesions showed similar histologies, Grade 2 invasive ductal carcinoma, strongly ER/PR + and Her2 negative.   MRI will be ordered of breasts.  Plan for oncotype testing per medical oncology.  Ms. Levay is accompanied by her daughter today. This is her first cancer diagnosis. She lives in [place]. She has never been on hormone replacement therapy.   PREVIOUS RADIATION THERAPY: No  PAST MEDICAL HISTORY:  has a past medical history of Hypertension; High cholesterol; Breast cancer of upper-outer quadrant of right female breast ([place]) (06/23/2015); Breast cancer ([place]); and Anemia.    PAST SURGICAL HISTORY:No past surgical history on file.  FAMILY HISTORY: family history is not on file.  SOCIAL HISTORY:  reports that she has never smoked. She does not have any smokeless tobacco history on file. She reports that she does not drink alcohol or use illicit drugs.  ALLERGIES: Aspirin  MEDICATIONS:  Current Outpatient Prescriptions  Medication Sig Dispense Refill  . atenolol (TENORMIN) 25 MG tablet Take 25 mg by mouth daily.    . hydrochlorothiazide (HYDRODIURIL) 25 MG tablet  Take 25 mg by mouth daily.    [name] ibuprofen (ADVIL,MOTRIN) 800 MG tablet Take 1 tablet (800 mg total) by mouth 3 (three) times daily. 21 tablet 0  . lisinopril (PRINIVIL,ZESTRIL) 10 MG tablet Take 10 mg by mouth daily.    . traMADol (ULTRAM) 50 MG tablet Take by mouth 2 (two) times daily.    [name] triamterene-hydrochlorothiazide (MAXZIDE-25) 37.5-25 MG tablet Take 1 tablet by mouth daily.  5   No current facility-administered medications for this encounter.    REVIEW OF SYSTEMS:  Notable for that above.   Gynecologic History  Age at first menstrual period? N/A  Are you still having periods? No Approximate date of last period? N/A  If you are still having periods: Are your periods regular? N/A  If you no longer have periods: Have you used hormone replacement? No  If YES, for how long? N/A When did you stop? N/A Obstetric History:  How many children have you carried to term? 2 Your age at first live birth? 52  Pregnant now or trying to get pregnant? No  Have you used birth control pills or hormone shots for contraception? No  If so, for how long (or approximate dates)? N/A  Would you be interested in learning more about the options to preserve fertility? No Health Maintenance:  Have you ever had a colonoscopy? No If yes, date? N/A  Have you ever had a bone density? Yes If yes, date? N/A  Date of your last PAP smear? N/A Date of your FIRST mammogram? N/A   PHYSICAL EXAM:  Vitals with BMI  07/02/2015  Height _0   Weight 172 lbs 8 oz  BMI 59.9  Systolic 774  Diastolic 67  Pulse 71  Respirations 20   General: Alert and oriented, in no acute distress HEENT: Head is normocephalic. Extraocular movements are intact.  Left eye deviates laterally Neck: Neck is supple, no palpable cervical or supraclavicular lymphadenopathy. Heart: Regular in rate and rhythm with no murmurs, rubs, or gallops. Chest: Clear to auscultation bilaterally, with no rhonchi, wheezes, or rales. Abdomen: Soft,  nontender, nondistended, with no rigidity or guarding. Extremities: No cyanosis or edema. Lymphatics: see Neck Exam Skin: No concerning lesions. Musculoskeletal: symmetric strength and muscle tone throughout. Neurologic: Cranial nerves II through XII are grossly intact. No obvious focalities. Speech is fluent. Coordination is intact. Psychiatric: Judgment and insight are intact. Affect is appropriate. Breasts:     Right: No palpable axillary masses. There is a tri-lobed mass in the 9:30 o'clock region of the right breast. The entire mass like area measures approximately 2.5 cm in greatest dimension.    Left: No palpable axillary masses. No palpable left breast masses.    ECOG = 0  0 - Asymptomatic (Fully active, able to carry on all predisease activities without restriction)  1 - Symptomatic but completely ambulatory (Restricted in physically strenuous activity but ambulatory and able to carry out work of a light or sedentary nature. For example, light housework, office work)  2 - Symptomatic, <50% in bed during the day (Ambulatory and capable of all self care but unable to carry out any work activities. Up and about more than 50% of waking hours)  3 - Symptomatic, >50% in bed, but not bedbound (Capable of only limited self-care, confined to bed or chair 50% or more of waking hours)  4 - Bedbound (Completely disabled. Cannot carry on any self-care. Totally confined to bed or chair)  5 - Death   Eustace Pen MM, Creech RH, Tormey DC, et al. [phone]). "Toxicity and response criteria of the [facility]". [place] Oncol. 5 (6): 649-55   LABORATORY DATA:  Lab Results  Component Value Date   WBC 5.6 07/02/2015   HGB 11.0* 07/02/2015   HCT 33.5* 07/02/2015   MCV 80.9 07/02/2015   PLT 254 07/02/2015   CMP     Component Value Date/Time   NA 141 07/02/2015 1331   NA 141 04/08/2010 1725   K 5.3* 07/02/2015 1331   K 4.1 04/08/2010 1725   CL 105 04/08/2010 1725   CO2  24 07/02/2015 1331   CO2 27 04/01/2010 0000   GLUCOSE 111 07/02/2015 1331   GLUCOSE 106* 04/08/2010 1725   BUN 26.0 07/02/2015 1331   BUN 19 04/08/2010 1725   CREATININE 1.4* 07/02/2015 1331   CREATININE 1.0 04/08/2010 1725   CALCIUM 10.3 07/02/2015 1331   CALCIUM 9.9 04/01/2010 0000   PROT 7.6 07/02/2015 1331   PROT 7.7 04/01/2010 0000   ALBUMIN 4.0 07/02/2015 1331   ALBUMIN 4.4 04/01/2010 0000   AST 18 07/02/2015 1331   AST 17 04/01/2010 0000   ALT 13 07/02/2015 1331   ALT 15 04/01/2010 0000   ALKPHOS 108 07/02/2015 1331   ALKPHOS 98 04/01/2010 0000   BILITOT 0.48 07/02/2015 1331   BILITOT 0.5 04/01/2010 0000         RADIOGRAPHY: Mm Digital Diagnostic Unilat R  06/13/2015  CLINICAL DATA:  Post ultrasound-guided biopsy of 2 suspicious masses in the right breast and 9 o'clock. EXAM: DIAGNOSTIC RIGHT MAMMOGRAM POST ULTRASOUND BIOPSY  COMPARISON:  Previous exam(s). FINDINGS: Mammographic images were obtained following ultrasound guided biopsies of 2 masses in the right breast at 9 o'clock. Site 1: A ribbon shaped biopsy marking clip is present in the slightly larger mass in the right breast at the 9 o'clock position. Site 2: A coil shaped biopsy marking clip is present in the slightly smaller more superficial mass in the right breast at the 9 o'clock position. IMPRESSION: Appropriate biopsy marker clip position as stated above. Final Assessment: Post Procedure Mammograms for Marker Placement Electronically Signed   By: Everlean Alstrom M.D.   On: 06/13/2015 16:28   US Breast Ltd Uni Right Inc Axilla  06/09/2015  CLINICAL DATA:  Possible mass right breast identified on recent 2D screening mammogram. The patient is asymptomatic. EXAM: DIGITAL DIAGNOSTIC RIGHT MAMMOGRAM WITH 3D TOMOSYNTHESIS WITH CAD ULTRASOUND RIGHT BREAST COMPARISON:  05/19/2015 and prior mammograms dating back to 04/10/2009 ACR Breast Density Category d: The breast tissue is extremely dense, which lowers the sensitivity  of mammography. FINDINGS: CC and MLO whole breast tomographic images are performed. There is an area of distortion in the outer right breast, seen above the level of the nipple on the MLO projection. In the CC projection, this area of concern is separate from the area in question on the 2D screening mammogram. I do not see a suspicious persistent mass in the medial right breast in the CC projection. In the MLO view, the area of distortion is in the region of the questioned mass on the recent mammogram. Mammographic images were processed with CAD. On physical exam, I do palpate slight nodularity in the 9 o'clock position of the right breast centered 3 cm from the nipple. Targeted ultrasound is performed, showing 2 adjacent irregular hypoechoic masses at 9 o'clock position 3-4 cm from the nipple. The larger of the masses contain some internal echogenic foci consistent with calcifications. The largest of these masses measures approximately 1.9 x 1.4 x 1.8 cm and contains prominent internal vascular flow and internal calcifications. Approximately 5 mm directly lateral to this mass is a second smaller irregular mass measuring 1.2 x 0.9 x 1.1 cm. There is prominent vascular flow within this mass as well as some internal calcifications. In total, the 2 masses span at least 3.3 cm. Ultrasound of the right axilla is negative. No lymphadenopathy is identified. IMPRESSION: 3D tomographic images show an area of distortion in the outer right breast that was not apparent on the 2D images. On ultrasound, there are 2 adjacent suspicious irregular masses in the 9 o'clock position, that in total span at least 3.3 cm, as described above. Findings are worrisome for malignancy. RECOMMENDATION: Ultrasound-guided biopsies of both of these suspicious masses in the 9 o'clock region of the right breast are recommended. This has been scheduled for the patient on 06/13/2015 at 3 o\'clock p.m. I have discussed the findings and recommendations  with the patient. Results were also provided in writing at the conclusion of the visit. If applicable, a reminder letter will be sent to the patient regarding the next appointment. BI-RADS CATEGORY  5: Highly suggestive of malignancy. Electronically Signed   By: Curlene Dolphin M.D.   On: 06/09/2015 12:25   Mm Diag Breast Tomo Uni Right  06/09/2015  CLINICAL DATA:  Possible mass right breast identified on recent 2D screening mammogram. The patient is asymptomatic. EXAM: DIGITAL DIAGNOSTIC RIGHT MAMMOGRAM WITH 3D TOMOSYNTHESIS WITH CAD ULTRASOUND RIGHT BREAST COMPARISON:  05/19/2015 and prior mammograms dating back to 04/10/2009 ACR Breast  Density Category d: The breast tissue is extremely dense, which lowers the sensitivity of mammography. FINDINGS: CC and MLO whole breast tomographic images are performed. There is an area of distortion in the outer right breast, seen above the level of the nipple on the MLO projection. In the CC projection, this area of concern is separate from the area in question on the 2D screening mammogram. I do not see a suspicious persistent mass in the medial right breast in the CC projection. In the MLO view, the area of distortion is in the region of the questioned mass on the recent mammogram. Mammographic images were processed with CAD. On physical exam, I do palpate slight nodularity in the 9 o'clock position of the right breast centered 3 cm from the nipple. Targeted ultrasound is performed, showing 2 adjacent irregular hypoechoic masses at 9 o'clock position 3-4 cm from the nipple. The larger of the masses contain some internal echogenic foci consistent with calcifications. The largest of these masses measures approximately 1.9 x 1.4 x 1.8 cm and contains prominent internal vascular flow and internal calcifications. Approximately 5 mm directly lateral to this mass is a second smaller irregular mass measuring 1.2 x 0.9 x 1.1 cm. There is prominent vascular flow within this mass as  well as some internal calcifications. In total, the 2 masses span at least 3.3 cm. Ultrasound of the right axilla is negative. No lymphadenopathy is identified. IMPRESSION: 3D tomographic images show an area of distortion in the outer right breast that was not apparent on the 2D images. On ultrasound, there are 2 adjacent suspicious irregular masses in the 9 o'clock position, that in total span at least 3.3 cm, as described above. Findings are worrisome for malignancy. RECOMMENDATION: Ultrasound-guided biopsies of both of these suspicious masses in the 9 o'clock region of the right breast are recommended. This has been scheduled for the patient on 06/13/2015 at 3 o\'clock p.m. I have discussed the findings and recommendations with the patient. Results were also provided in writing at the conclusion of the visit. If applicable, a reminder letter will be sent to the patient regarding the next appointment. BI-RADS CATEGORY  5: Highly suggestive of malignancy. Electronically Signed   By: Curlene Dolphin M.D.   On: 06/09/2015 12:25   [place] Rt Breast Bx W Loc Dev 1st Lesion Img Bx Spec US Guide  06/24/2015  ADDENDUM REPORT: 06/23/2015 15:03 ADDENDUM: Pathology revealed invasive ductal carcinoma and ductal carcinoma in situ in two masses in the right breast at 9:00. This was found to be concordant by Dr. Everlean Alstrom. Daily attempts have made to contact the patient since the results were available on June 17, 2015. A message was left for her physician, Dr. Lillia Corporal, today, June 23, 2015. The patient called in today, June 23, 2015, for biopsy results. She reported doing well after the biopsies. Post biopsy instructions and care were reviewed and her questions were answered. She has been scheduled at [facility] on July 02, 2015. My number was provided for additional questions and concerns. Pathology results reported by Susa Raring RN, BSN on June 23, 2015.  Electronically Signed   By: Everlean Alstrom M.D.   On: 06/23/2015 15:03  06/24/2015  CLINICAL DATA:  69 year old female with 2 highly suspicious masses in the outer right breast at 9 o'clock. EXAM: ULTRASOUND GUIDED RIGHT BREAST CORE NEEDLE BIOPSY COMPARISON:  Previous exam(s). FINDINGS: I met with the patient and we discussed the procedure of ultrasound-guided biopsy,  including benefits and alternatives. We discussed the high likelihood of a successful procedure. We discussed the risks of the procedure, including infection, bleeding, tissue injury, clip migration, and inadequate sampling. Informed written consent was given. The usual time-out protocol was performed immediately prior to the procedure. Site 1: Using sterile technique and 2% Lidocaine as local anesthetic, under direct ultrasound visualization, a 12 gauge spring-loaded device was used to perform biopsy of the larger mass in the right breast at 9 o'clock using a lateral to medial approach. At the conclusion of the procedure a ribbon shaped tissue marker clip was deployed into the biopsy cavity. Site 2: Using sterile technique and 2% Lidocaine as local anesthetic, under direct ultrasound visualization, a 12 gauge spring-loaded device was used to perform biopsy of the slightly smaller and more superficial mass in the right breast at 9 o'clock using a lateral to medial approach. At the conclusion of the procedure a coil shaped tissue marker clip was deployed into the biopsy cavity. Follow up 2 view mammogram was performed and dictated separately. IMPRESSION: 1. Ultrasound-guided biopsy of a suspicious mass in the right breast at 9 o'clock (site 1: Slightly larger mass at site of a ribbon clip). 2. Ultrasound-guided biopsy of a suspicious mass in the right breast at 9 o'clock (site 2: Slightly smaller more superficial mass at site of a coil clip). Electronically Signed: By: Everlean Alstrom M.D. On: 06/13/2015 16:09   [place] Rt Breast Bx W Loc Dev Ea Add  Lesion Img Bx Spec US Guide  06/24/2015  ADDENDUM REPORT: 06/23/2015 15:03 ADDENDUM: Pathology revealed invasive ductal carcinoma and ductal carcinoma in situ in two masses in the right breast at 9:00. This was found to be concordant by Dr. Everlean Alstrom. Daily attempts have made to contact the patient since the results were available on June 17, 2015. A message was left for her physician, Dr. Lillia Corporal, today, June 23, 2015. The patient called in today, June 23, 2015, for biopsy results. She reported doing well after the biopsies. Post biopsy instructions and care were reviewed and her questions were answered. She has been scheduled at [facility] on July 02, 2015. My number was provided for additional questions and concerns. Pathology results reported by Susa Raring RN, BSN on June 23, 2015. Electronically Signed   By: Everlean Alstrom M.D.   On: 06/23/2015 15:03  06/24/2015  CLINICAL DATA:  69 year old female with 2 highly suspicious masses in the outer right breast at 9 o'clock. EXAM: ULTRASOUND GUIDED RIGHT BREAST CORE NEEDLE BIOPSY COMPARISON:  Previous exam(s). FINDINGS: I met with the patient and we discussed the procedure of ultrasound-guided biopsy, including benefits and alternatives. We discussed the high likelihood of a successful procedure. We discussed the risks of the procedure, including infection, bleeding, tissue injury, clip migration, and inadequate sampling. Informed written consent was given. The usual time-out protocol was performed immediately prior to the procedure. Site 1: Using sterile technique and 2% Lidocaine as local anesthetic, under direct ultrasound visualization, a 12 gauge spring-loaded device was used to perform biopsy of the larger mass in the right breast at 9 o'clock using a lateral to medial approach. At the conclusion of the procedure a ribbon shaped tissue marker clip was deployed into the biopsy cavity. Site  2: Using sterile technique and 2% Lidocaine as local anesthetic, under direct ultrasound visualization, a 12 gauge spring-loaded device was used to perform biopsy of the slightly smaller and more superficial mass in the right breast  at 9 o'clock using a lateral to medial approach. At the conclusion of the procedure a coil shaped tissue marker clip was deployed into the biopsy cavity. Follow up 2 view mammogram was performed and dictated separately. IMPRESSION: 1. Ultrasound-guided biopsy of a suspicious mass in the right breast at 9 o'clock (site 1: Slightly larger mass at site of a ribbon clip). 2. Ultrasound-guided biopsy of a suspicious mass in the right breast at 9 o'clock (site 2: Slightly smaller more superficial mass at site of a coil clip). Electronically Signed: By: Everlean Alstrom M.D. On: 06/13/2015 16:09      IMPRESSION/PLAN: Multifocal breast cancer  MRI will be ordered for further breast staging.  Oncotype Dx will likely determine if chemotherapy is recommended.  Today, I talked to the patient about the findings and work-up thus far. We discussed the patient's diagnosis of breast cancer and general treatment for this, highlighting the role of radiotherapy in the management. We discussed the available radiation techniques, and focused on the details of logistics and delivery.    We discussed the risks, benefits, and side effects of radiotherapy. Side effects may include but not necessarily be limited to: skin irritation, fatigue, and rare internal organ injury.  If breast conservation is pursued, adjuvant radiotherapy is indicated to reduce risk of locoregional recurrence by 2/3. It is less likely that she would need RT post mastectomy, but not impossible.   I look forward to participating in her care, if needed.   __________________________________________   Eppie Gibson, MD    This document serves as a record of services personally performed by Eppie Gibson, MD. It was  created on her behalf by Arlyce Harman, a trained medical scribe. The creation of this record is based on the scribe's personal observations and the provider's statements to them. This document has been checked and approved by the attending provider.

## 2015-07-02 NOTE — H&P (Signed)
Christina [name] 07/02/2015 7:49 AM Location: [facility] Patient #: [id number] DOB: 1945-10-02 Undefined / Language: Christina [name] / Race: White Female  History of Present Illness Christina Moores A. Laderrick Wilk MD; 07/02/2015 3:57 PM) Patient words: patient sent at the request of Dr.Squire for right breast cancer. She aas found on recent screening mammography to have a bilobedlesion right breast upper-outer quadrant 1 lobe measured 1.9 cm the other 1.3 cm. These were biopsied which showed invasive ductal carcinoma. Denies any history of breast pain, breast mass, change in appearance of breast or nipple retraction.       ADDITIONAL INFORMATION: 1. Carcinoma stained positive for E-cadherin and support invasive ductal carcinoma. (ZLL:gt, 06/17/15) Claudette Laws MD Pathologist, Electronic Signature ( Signed 06/19/2015) 2. FLUORESCENCE IN-SITU HYBRIDIZATION Results: HER2 - NEGATIVE RATIO OF HER2/CEP17 SIGNALS 1.15 AVERAGE HER2 COPY NUMBER PER CELL 1.95 Reference Range: NEGATIVE HER2/CEP17 Ratio <2.0 and average HER2 copy number <4.0 EQUIVOCAL HER2/CEP17 Ratio <2.0 and average HER2 copy number >=4.0 and <6.0 POSITIVE HER2/CEP17 Ratio >=2.0 or <2.0 and average HER2 copy number >=6.0 Claudette Laws MD Pathologist, Electronic Signature ( Signed 06/19/2015) 2. PROGNOSTIC INDICATORS Results: IMMUNOHISTOCHEMICAL AND MORPHOMETRIC ANALYSIS PERFORMED MANUALLY Estrogen Receptor: 100%, POSITIVE, STRONG STAINING INTENSITY Progesterone Receptor: 100%, POSITIVE, STRONG STAINING INTENSITY Proliferation Marker Ki67: 5% 1 of 3 FINAL for [name], [name] ([id number]) ADDITIONAL INFORMATION:(continued) REFERENCE RANGE ESTROGEN RECEPTOR NEGATIVE 0% POSITIVE =>1% REFERENCE RANGE PROGESTERONE RECEPTOR NEGATIVE 0% POSITIVE =>1% All controls stained appropriately Enid Cutter MD Pathologist, Electronic Signature ( Signed 06/17/2015) FINAL DIAGNOSIS Diagnosis 1. Breast, right, needle core biopsy, 9:00  o'clock INVASIVE AND IN SITU MAMMARY CARCINOMA 2. Breast, right, needle core biopsy, 9:00 o'clock INVASIVE AND IN SITU MAMMARY CARCINOMA Microscopic Comment 1. and 2. E-Cadherin and Breast prognostic profile has been ordered. The [place] has been informed (06/16/2015). Diagnosis of malignancy confirmed with Dr. Avis Epley. Casimer Lanius MD Pathologist, Electronic Signature (Case signed 06/16/2015) Specimen Gross and Clinical Information Specimen Comment 1. In formalin 3:45; mass 2. Mass Specimen(s) Obtained: 1. Breast, right, needle core biopsy, 9:00 o'clock 2. Breast, right, needle core biopsy, 9:00 o'clock Specimen Clinical Information 1. Suspect IMC 2. Suspect IMC 2 of 3 FINAL for [name], [name] [phone]) Gross 1. Received in formalin are 3 cores of soft, tan-red tissue which measure 1.5 x 0.3 x 0.2 cm and up to 2.0 x 0.3 x 0.2 cm. Submitted in toto in 1 block(s). Time in Formalin 3:45 pm (sk) 2. Received in formalin are 2 cores of soft, tan-red tissue which measure 1.3 x 0.3 x 0.3 cm and up to 1.5 x 0.3 x 0.2 cm. Submitted in toto in 1 block(s). Time in Formalin 3:45 pm (sk) Stain(s) used in Diagnosis: The following stain(s) were used in diagnosing the case: Her2 FISH, PR-ACIS, KI-67-ACIS, ER-ACIS, E-CAD. The control(s) stained appropriately. Disclaimer HER2 IQFISH pharmDX (code [id number]) is a direct fluorescence in-situ hybridization assay designed to quantitatively determine HER2 gene amplification in formalin-fixed, paraffin-embedded tissue specimens. It is performed at [facility] and is reported using ASCO/CAP scoring criteria published in 2013. Some of these immunohistochemical stains may have been developed and the performance characteristics determined by [facility]. Some may not have been cleared or approved by the U.S. Food and Drug Administration. The FDA has determined that such clearance or approval is not necessary.  This test is used for clinical purposes. It should not be regarded as investigational or for research. This laboratory is certified under the [place] (CLIA-88)  as qualified to perform high complexity clinical laboratory testing. PR progesterone receptor (PgR 636), immunohistochemical stains are performed on formalin fixed, paraffin embedded tissue using a 3,3"-diaminobenzidine (DAB) chromogen and [employer] System. The staining intensity of the nucleus is scored morphometrically using the Automated Cellular Imaging System (ACIS) and is reported as the percentage of tumor cell nuclei demonstrating specific nuclear staining. Ki-67 (Mib-1), immunohistochemical stains are performed on formalin fixed, paraffin embedded tissue using a 3,3"-diaminobenzidine (DAB) chromogen and [place]. The staining intensity of the nucleus is scored morphometrically using the Automated Cellular Imaging System (ACIS) and is reported as the percentage of tumor cell nuclei demonstrating specific nuclear staining. Estrogen receptor (SP1), immunohistochemical stains are performed on formalin fixed, paraffin embedded tissue using a 3,3"-diaminobenzidine (DAB) chromogen and [employer] System. The staining intensity of the nucleus is scored morphometrically using the Automated Cellular Imaging System (ACIS) and is reported as the percentage of tumor cell nuclei demonstrating specific nuclear staining. Report signed out from the following location(s) Technical Component was performed at [facility]. [address],[place] [address].HFWY:[id number]., Interpretation was performed at [place], [place], [place] [address]. CLIA #: [id number], 3 of     CLINICAL DATA: Possible mass right breast identified on recent 2D screening mammogram. The patient is asymptomatic.  EXAM: DIGITAL  DIAGNOSTIC RIGHT MAMMOGRAM WITH 3D TOMOSYNTHESIS WITH CAD  ULTRASOUND RIGHT BREAST  COMPARISON: 05/19/2015 and prior mammograms dating back to 04/10/2009  ACR Breast Density Category d: The breast tissue is extremely dense, which lowers the sensitivity of mammography.  FINDINGS: CC and MLO whole breast tomographic images are performed. There is an area of distortion in the outer right breast, seen above the level of the nipple on the MLO projection. In the CC projection, this area of concern is separate from the area in question on the 2D screening mammogram. I do not see a suspicious persistent mass in the medial right breast in the CC projection. In the MLO view, the area of distortion is in the region of the questioned mass on the recent mammogram.  Mammographic images were processed with CAD.  On physical exam, I do palpate slight nodularity in the 9 o'clock position of the right breast centered 3 cm from the nipple.  Targeted ultrasound is performed, showing 2 adjacent irregular hypoechoic masses at 9 o'clock position 3-4 cm from the nipple. The larger of the masses contain some internal echogenic foci consistent with calcifications. The largest of these masses measures approximately 1.9 x 1.4 x 1.8 cm and contains prominent internal vascular flow and internal calcifications. Approximately 5 mm directly lateral to this mass is a second smaller irregular mass measuring 1.2 x 0.9 x 1.1 cm. There is prominent vascular flow within this mass as well as some internal calcifications. In total, the 2 masses span at least 3.3 cm.  Ultrasound of the right axilla is negative. No lymphadenopathy is identified.  IMPRESSION: 3D tomographic images show an area of distortion in the outer right breast that was not apparent on the 2D images. On ultrasound, there are 2 adjacent suspicious irregular masses in the 9 o'clock position, that in total span at least 3.3 cm, as described  above. Findings are worrisome for malignancy.  RECOMMENDATION: Ultrasound-guided biopsies of both of these suspicious masses in the 9 o'clock region of the right breast are recommended. This has been scheduled for the patient on 06/13/2015 at 3 o\'clock p.m.  I have discussed the findings and recommendations  with the patient. Results were also provided in writing at the conclusion of the visit. If applicable, a reminder letter will be sent to the patient regarding the next appointment.  BI-RADS CATEGORY 5: Highly suggestive of malignancy.   Electronically Signed By: Curlene Dolphin M.D. On: 06/09/2015 12:25.  The patient is a 69 year old female.    Review of Systems (Math Brazie A. Hortence Charter MD; 07/02/2015 4:04 PM) General Not Present- Appetite Loss, Chills, Fatigue, Fever, Night Sweats, Weight Gain and Weight Loss. Respiratory Not Present- Bloody sputum, Chronic Cough, Difficulty Breathing, Snoring and Wheezing. Cardiovascular Not Present- Chest Pain, Difficulty Breathing Lying Down, Leg Cramps, Palpitations, Rapid Heart Rate, Shortness of Breath and Swelling of Extremities. Gastrointestinal Not Present- Abdominal Pain, Bloating, Bloody Stool, Change in Bowel Habits, Chronic diarrhea, Constipation, Difficulty Swallowing, Excessive gas, Gets full quickly at meals, Hemorrhoids, Indigestion, Nausea, Rectal Pain and Vomiting. Female Genitourinary Not Present- Frequency, Nocturia, Painful Urination, Pelvic Pain and Urgency. Musculoskeletal Not Present- Back Pain, Joint Pain, Joint Stiffness, Muscle Pain, Muscle Weakness and Swelling of Extremities. Neurological Not Present- Decreased Memory, Fainting, Headaches, Numbness, Seizures, Tingling, Tremor, Trouble walking and Weakness. Psychiatric Not Present- Anxiety, Bipolar, Change in Sleep Pattern, Depression, Fearful and Frequent crying.   Physical Exam (Brantly Kalman A. Phillipe Clemon MD; 07/02/2015 3:58 PM)  General Mental Status-Alert. General  Appearance-Consistent with stated age. Hydration-Well hydrated. Voice-Normal.  Head and Neck Head-normocephalic, atraumatic with no lesions or palpable masses. Trachea-midline. Thyroid Gland Characteristics - normal size and consistency.  Eye Note: mild strabismus noted  Chest and Lung Exam Chest and lung exam reveals -quiet, even and easy respiratory effort with no use of accessory muscles and on auscultation, normal breath sounds, no adventitious sounds and normal vocal resonance. Inspection Chest Wall - Normal. Back - normal.  Breast Note: ight breast with 2 cm mobile area between 9 and 10:00 No nipple retraction. Left breast normal.  Cardiovascular Cardiovascular examination reveals -normal heart sounds, regular rate and rhythm with no murmurs and normal pedal pulses bilaterally.  Neurologic Neurologic evaluation reveals -alert and oriented x 3 with no impairment of recent or remote memory. Mental Status-Normal.  Musculoskeletal Normal Exam - Left-Upper Extremity Strength Normal and Lower Extremity Strength Normal. Normal Exam - Right-Upper Extremity Strength Normal and Lower Extremity Strength Normal.  Lymphatic Head & Neck  General Head & Neck Lymphatics: Bilateral - Description - Normal. Axillary  General Axillary Region: Bilateral - Description - Normal. Tenderness - Non Tender.    Assessment & Plan (Robert Sperl A. Tonique Mendonca MD; 07/02/2015 4:00 PM)  BREAST CANCER, STAGE 1, RIGHT (C50.911) Impression: Patient requires MRI due to dense breasts. She would like to proceed with right breast lumpectomy with sentinel lymph node mapping. Discussed mastectomy as well as reconstruction. Discussed the roles of adjuvant therapy with her. Risk of lumpectomy include bleeding, infection, seroma, more surgery, use of seed/wire, wound care, cosmetic deformity and the need for other treatments, death , blood clots, death. Pt agrees to proceed. Risk of sentinel lymph  node mapping include bleeding, infection, lymphedema, shoulder pain. stiffness, dye allergy. cosmetic deformity , blood clots, death, need for more surgery. Pt agres to proceed.  Current Plans You are being scheduled for surgery - Our schedulers will call you.  You should hear from our office's scheduling department within 5 working days about the location, date, and time of surgery. We try to make accommodations for patient's preferences in scheduling surgery, but sometimes the OR schedule or the surgeon's schedule prevents [place] from making those accommodations.  If  you have not heard from our office [phone]) in 5 working days, call the office and ask for your surgeon's nurse.  If you have other questions about your diagnosis, plan, or surgery, call the office and ask for your surgeon's nurse.  Pt Education - [facility] Breast Cancer Information Given - [facility] "Breast Journey" Package We discussed the staging and pathophysiology of breast cancer. We discussed all of the different options for treatment for breast cancer including surgery, chemotherapy, radiation therapy, Herceptin, and antiestrogen therapy. We discussed a sentinel lymph node biopsy as she does not appear to having lymph node involvement right now. We discussed the performance of that with injection of radioactive tracer and blue dye. We discussed that she would have an incision underneath her axillary hairline. We discussed that there is a bout a 10-20% chance of having a positive node with a sentinel lymph node biopsy and we will await the permanent pathology to make any other first further decisions in terms of her treatment. One of these options might be to return to the operating room to perform an axillary lymph node dissection. We discussed about a 1-2% risk lifetime of chronic shoulder pain as well as lymphedema associated with a sentinel lymph node biopsy. We discussed the options for treatment of the breast cancer which included  lumpectomy versus a mastectomy. We discussed the performance of the lumpectomy with a wire placement. We discussed a 10-20% chance of a positive margin requiring reexcision in the operating room. We also discussed that she may need radiation therapy or antiestrogen therapy or both if she undergoes lumpectomy. We discussed the mastectomy and the postoperative care for that as well. We discussed that there is no difference in her survival whether she undergoes lumpectomy with radiation therapy or antiestrogen therapy versus a mastectomy. There is a slight difference in the local recurrence rate being 3-5% with lumpectomy and about 1% with a mastectomy. We discussed the risks of operation including bleeding, infection, possible reoperation. She understands her further therapy will be based on what her stages at the time of her operation.  Pt Education - flb breast cancer surgery: discussed with patient and provided information. Pt Education - [facility] Breast Biopsy HCI: discussed with patient and provided information. Pt Education - ABC (After Breast Cancer) Class Info: discussed with patient and provided information.

## 2015-07-02 NOTE — Telephone Encounter (Signed)
Left message about her appointment today at 1230pm.  Also left message with son Tempie Donning concerning her appointment that she has not shown up for.

## 2015-07-04 ENCOUNTER — Encounter: Payer: Self-pay | Admitting: *Deleted

## 2015-07-04 ENCOUNTER — Other Ambulatory Visit: Payer: Medicare Other

## 2015-07-04 NOTE — Progress Notes (Signed)
[place] Psychosocial Distress Screening Clinical Social Work  Counseling Intern was referred by distress screening protocol.  The patient scored a 0 on the Psychosocial Distress Thermometer which indicates mild distress. Counseling Intern to assess for distress and other psychosocial needs.   ONCBCN DISTRESS SCREENING 07/04/2015  Screening Type Initial Screening  Distress experienced in past week (1-10) 0  Information Concerns Type Lack of info about diagnosis;Lack of info about treatment;Lack of info about complementary therapy choices  Referral to support programs Yes   Counseling Intern Note: Met with patient and daughter [name] during [facility].  Pt reported a level of 0 at the time of our encounter and reported no distress related to her Dx or treatment.  Pt reported having strong systems of support in place including family and friends.  Counseling intern introduced and provided information on supportive services available at [facility].    FU: No follow up from the support center is indicated at this time.   Vaughan Sine Counseling Intern [phone]

## 2015-07-04 NOTE — Progress Notes (Signed)
[place] Work  Clinical Social Work was referred by patient navigator that pt wanted to complete ADRs.  Clinical Social Worker contacted patient at home to offer support and assess for needs.  CSW left message and awaits return call.   Loren Racer, [place] Worker [place]  [place] Phone: [phone] Fax: [phone]

## 2015-07-06 ENCOUNTER — Telehealth: Payer: Self-pay | Admitting: Oncology

## 2015-07-06 NOTE — Telephone Encounter (Signed)
Called and left a message with 09/2015 appointment and mailed a calendar  As well

## 2015-07-09 ENCOUNTER — Other Ambulatory Visit: Payer: Self-pay | Admitting: Surgery

## 2015-07-09 ENCOUNTER — Ambulatory Visit
Admission: RE | Admit: 2015-07-09 | Discharge: 2015-07-09 | Disposition: A | Discharge status: Home / Self Care | Payer: Medicare Other | Source: Ambulatory Visit | Attending: Surgery | Admitting: Surgery

## 2015-07-09 DIAGNOSIS — C50411 Malignant neoplasm of upper-outer quadrant of right female breast: Secondary | ICD-10-CM

## 2015-07-09 DIAGNOSIS — N6320 Unspecified lump in the left breast, unspecified quadrant: Secondary | ICD-10-CM

## 2015-07-09 MED ORDER — GADOBENATE DIMEGLUMINE 529 MG/ML IV SOLN
16.0000 mL | Freq: Once | INTRAVENOUS | Status: AC | PRN
  Administered 2015-07-09: 16 mL via INTRAVENOUS

## 2015-07-10 ENCOUNTER — Other Ambulatory Visit: Payer: Medicare Other

## 2015-07-22 ENCOUNTER — Telehealth: Payer: Self-pay | Admitting: *Deleted

## 2015-07-22 NOTE — Telephone Encounter (Signed)
Attempted to call patient to follow up because [facility] is unable to reach patient.  Patient's phone number is invalid and attempted to call patient's emergency contact Randol Kern, but did not get an answer nor did they have voicemail.

## 2015-07-31 ENCOUNTER — Ambulatory Visit
Admission: RE | Admit: 2015-07-31 | Discharge: 2015-07-31 | Disposition: A | Discharge status: Home / Self Care | Payer: Medicare Other | Source: Ambulatory Visit | Attending: Surgery | Admitting: Surgery

## 2015-07-31 DIAGNOSIS — N6320 Unspecified lump in the left breast, unspecified quadrant: Secondary | ICD-10-CM

## 2015-08-11 ENCOUNTER — Ambulatory Visit: Payer: Self-pay | Admitting: Surgery

## 2015-08-11 DIAGNOSIS — C50112 Malignant neoplasm of central portion of left female breast: Principal | ICD-10-CM

## 2015-08-11 DIAGNOSIS — C50111 Malignant neoplasm of central portion of right female breast: Secondary | ICD-10-CM

## 2015-08-11 NOTE — H&P (Signed)
Ting L. Sallade 08/11/2015 2:54 PM Location: [facility] Patient #: [id number] DOB: 09-09-1945 Single / Language: Cleophus Molt / Race: White Female  History of Present Illness [name] A. Dorene Bruni MD; 08/11/2015 3:31 PM) Patient words: breast f/u Patient returns for follow-up. She was seen last month in the multidisciplinary breast clinic due to right breast cancer. MRI showed an abnormality in the left breast and this was core biopsied and found to be invasive ductal carcinoma. This was 1.4 cm diameter. She returns for follow-up and surgical planning. She has no new complaints today.     CLINICAL DATA: 69 year old female who was recall from screening mammography for an abnormality in the right breast. Subsequently, an area of distortion was identified on tomosynthesis images. Sonographically, 2 adjacent masses were identified, and biopsied on 06/13/2015. Pathology results indicated invasive ductal carcinoma and DCIS at the 2 sites in the lateral right breast. This MRI is being performed due to dense breast tissue. Currently, the patient desires breast conserving therapy. LABS: Creatinine 1.4 milligrams/deciliter and GFR of 46 on 07/02/2015. EXAM: BILATERAL BREAST MRI WITH AND WITHOUT CONTRAST TECHNIQUE: Multiplanar, multisequence MR images of both breasts were obtained prior to and following the intravenous administration of 16 ml of MultiHance. THREE-DIMENSIONAL MR IMAGE RENDERING ON INDEPENDENT WORKSTATION: Three-dimensional MR images were rendered by post-processing of the original MR data on an independent workstation. The three-dimensional MR images were interpreted, and findings are reported in the following complete MRI report for this study. Three dimensional images were evaluated at the independent DynaCad workstation COMPARISON: No prior breast MRI available for comparison. Correlation made with prior mammograms, including 06/13/2015. FINDINGS: Breast composition: d.  Extreme fibroglandular tissue. Background parenchymal enhancement: Moderate. Right breast: In the central to slightly lateral right breast, middle depth there is a 1.5 x 1.6 x 1.4 cm enhancing mass with an internal site of susceptibility, compatible with biopsy proven malignancy. Anterior and slightly superior and lateral to the more dominant mass is a smaller enhancing mass with a focus of internal susceptibility, compatible with the second site of biopsy proven malignancy. This mass measures 0.9 x 0.7 x 0.8 cm. Together, these masses span 4.0 cm in an oblique anterior to posterior dimension. No other enhancing masses are identified in the right breast. Left breast: In the lower-inner quadrant of the left breast, posterior depth there is a 1.4 x 0.8 x 1.3 cm suspicious enhancing spiculated mass. Lymph nodes: No abnormal appearing lymph nodes. Ancillary findings: None. IMPRESSION: 1. There is a suspicious 1.4 cm spiculated mass in the lower-inner quadrant of the left breast. 2. The biopsy-proven sites of malignancy in the right breast are identified measuring 1.6 and 0.9 cm respectively. No additional sites of disease are identified in the right breast. RECOMMENDATION: Second-look ultrasound is recommended to identify the 1.4 cm mass in the lower-inner quadrant of the left breast for biopsy. If not identified sonographically, MRI guided biopsy will be recommended. BI-RADS CATEGORY BI-RADS 4: Suspicious. Electronically Signed By: Ammie Ferrier M.D. On: 07/09/2015 10:13   CLINICAL DATA: 69 year old female who was recall from screening mammography for an abnormality in the right breast. Subsequently, an area of distortion was identified on tomosynthesis images. Sonographically, 2 adjacent masses were identified, and biopsied on 06/13/2015. Pathology results indicated invasive ductal carcinoma and DCIS at the 2 sites in the lateral right breast. This MRI is being performed due to  dense breast tissue. Currently, the patient desires breast conserving therapy. LABS: Creatinine 1.4 milligrams/deciliter and GFR of 46 on 07/02/2015. EXAM: BILATERAL  BREAST MRI WITH AND WITHOUT CONTRAST TECHNIQUE: Multiplanar, multisequence MR images of both breasts were obtained prior to and following the intravenous administration of 16 ml of MultiHance. THREE-DIMENSIONAL MR IMAGE RENDERING ON INDEPENDENT WORKSTATION: Three-dimensional MR images were rendered by post-processing of the original MR data on an independent workstation. The three-dimensional MR images were interpreted, and findings are reported in the following complete MRI report for this study. Three dimensional images were evaluated at the independent DynaCad workstation COMPARISON: No prior breast MRI available for comparison. Correlation made with prior mammograms, including 06/13/2015. FINDINGS: Breast composition: d. Extreme fibroglandular tissue. Background parenchymal enhancement: Moderate. Right breast: In the central to slightly lateral right breast, middle depth there is a 1.5 x 1.6 x 1.4 cm enhancing mass with an internal site of susceptibility, compatible with biopsy proven malignancy. Anterior and slightly superior and lateral to the more dominant mass is a smaller enhancing mass with a focus of internal susceptibility, compatible with the second site of biopsy proven malignancy. This mass measures 0.9 x 0.7 x 0.8 cm. Together, these masses span 4.0 cm in an oblique anterior to posterior dimension. No other enhancing masses are identified in the right breast. Left breast: In the lower-inner quadrant of the left breast, posterior depth there is a 1.4 x 0.8 x 1.3 cm suspicious enhancing spiculated mass. Lymph nodes: No abnormal appearing lymph nodes. Ancillary findings: None. IMPRESSION: 1. There is a suspicious 1.4 cm spiculated mass in the lower-inner quadrant of the left breast. 2. The biopsy-proven sites  of malignancy in the right breast are identified measuring 1.6 and 0.9 cm respectively. No additional sites of disease are identified in the right breast. RECOMMENDATION: Second-look ultrasound is recommended to identify the 1.4 cm mass in the lower-inner quadrant of the left breast for biopsy. If not identified sonographically, MRI guided biopsy will be recommended. BI-RADS CATEGORY BI-RADS 4: Suspicious. Electronically Signed By: Ammie Ferrier M.D. On: 07/09/2015 10:13         ADDITIONAL INFORMATION: PROGNOSTIC INDICATORS Results: IMMUNOHISTOCHEMICAL AND MORPHOMETRIC ANALYSIS PERFORMED MANUALLY Estrogen Receptor: 100%, POSITIVE, STRONG STAINING INTENSITY Progesterone Receptor: 95%, POSITIVE, STRONG STAINING INTENSITY Proliferation Marker Ki67: 5% REFERENCE RANGE ESTROGEN RECEPTOR NEGATIVE 0% POSITIVE =>1% REFERENCE RANGE PROGESTERONE RECEPTOR NEGATIVE 0% POSITIVE =>1% All controls stained appropriately Enid Cutter MD Pathologist, Electronic Signature ( Signed 08/05/2015) FLUORESCENCE IN-SITU HYBRIDIZATION Results: HER2 - NEGATIVE RATIO OF HER2/CEP17 SIGNALS 1.24 AVERAGE HER2 COPY NUMBER PER CELL 2.05 Reference Range: NEGATIVE HER2/CEP17 Ratio <2.0 and average HER2 copy number <4.0 EQUIVOCAL HER2/CEP17 Ratio <2.0 and average HER2 copy number >=4.0 and <6.0 1 of 3 FINAL for Adamek, Turquoise L ([id number])  Diagnosis Breast, left, needle core biopsy, 8:00 o'clock INVASIVE DUCTAL CARCINOMA, GRADE 1 TO 2 DUCTAL CARCINOMA IN SITU, GRADE 2      Diagnosis 1. Breast, right, needle core biopsy, 9:00 o'clock INVASIVE AND IN SITU MAMMARY CARCINOMA 2. Breast, right, needle core biopsy, 9:00 o'clock INVASIVE AND IN SITU MAMMARY CARCINOMA                    ADDITIONAL INFORMATION: 1. Carcinoma stained positive for E-cadherin and support invasive ductal carcinoma. (ZLL:gt, 06/17/15) Claudette Laws MD Pathologist, Electronic Signature ( Signed  06/19/2015) 2. FLUORESCENCE IN-SITU HYBRIDIZATION Results: HER2 - NEGATIVE RATIO OF HER2/CEP17 SIGNALS 1.15 AVERAGE HER2 COPY NUMBER PER CELL 1.95 Reference Range: NEGATIVE HER2/CEP17 Ratio <2.0 and average HER2 copy number <4.0 EQUIVOCAL HER2/CEP17 Ratio <2.0 and average HER2 copy number >=4.0 and <6.0 POSITIVE HER2/CEP17 Ratio >=2.0 or <2.0 and  average HER2 copy number >=6.0 Claudette Laws MD Pathologist, Electronic Signature ( Signed 06/19/2015) 2. PROGNOSTIC INDICATORS Results: IMMUNOHISTOCHEMICAL AND MORPHOMETRIC ANALYSIS PERFORMED MANUALLY Estrogen Receptor: 100%, POSITIVE, STRONG STAINING INTENSITY Progesterone Receptor: 100%, POSITIVE, STRONG STAINING INTENSITY Proliferation Marker Ki67: 5% 1 of 3 FINAL for Teichert, Syan L ([id number]) ADDITIONAL INFORMATION:(continued) REFERENCE RANGE ESTROGEN RECEPTOR NEGATIVE 0% POSITIVE =>1% REFERENCE RANGE PROGESTERONE RECEPTOR NEGATIVE 0% POSITIVE =>1% All controls stained appropriately Enid Cutter MD Pathologist, Electronic Signature ( Signed 06/17/2015) FINAL DIAGNOSIS Diagnosis 1. Breast, right, needle core biopsy, 9:00 o'clock INVASIVE AND IN SITU MAMMARY CARCINOMA 2. Breast, right, needle core biopsy, 9:00 o'clock INVASIVE AND IN SITU MAMMARY CARCINOMA Microscopic Comment 1. and 2. E-Cadherin and Breast prognostic profile has been ordered. The [facility] [place] has been informed (06/16/2015). Diagnosis of malignancy confirmed with Dr. Avis Epley. Casimer Lanius MD Pathologist, Electronic Signature (Case signed 06/16/2015) Specimen Gross and Clinical Information Specimen Comment 1. In formalin 3:45; mass 2. Mass Specimen(s) Obtained: 1. Breast, right, needle core biopsy, 9:00 o'clock 2. Breast, right, needle core biopsy, 9:00 o'clock Specimen Clinical Information 1. Suspect IMC 2. Suspect IMC 2 of.  The patient is a 69 year old female.   Other Problems Marjean Donna, CMA; 08/11/2015 2:54  PM) High blood pressure Hypercholesterolemia  Past Surgical History Marjean Donna, CMA; 08/11/2015 2:54 PM) Hysterectomy (not due to cancer) - Complete  Diagnostic Studies History Marjean Donna, CMA; 08/11/2015 2:54 PM) Colonoscopy never Mammogram within last year Pap Smear 1-5 years ago  Allergies Marjean Donna, CMA; 08/11/2015 2:55 PM) Aspirin *ANALGESICS - NonNarcotic*  Medication History (Sonya Bynum, CMA; 08/11/2015 2:55 PM) Atenolol (25MG Tablet, Oral) Active. HydroCHLOROthiazide (25MG Tablet, Oral) Active. Ibuprofen (800MG Tablet, Oral) Active. Lisinopril (10MG Tablet, Oral) Active. TraMADol HCl (50MG Tablet, Oral) Active. Triamterene-HCTZ (37.5-25MG Capsule, Oral) Active. Medications Reconciled  Social History Marjean Donna, CMA; 08/11/2015 2:54 PM) Caffeine use Carbonated beverages, Coffee, Tea. No alcohol use No drug use Tobacco use Never smoker.  Family History Marjean Donna, [place]; 08/11/2015 2:54 PM) Cancer Brother. Cervical Cancer Mother. Depression Daughter. Diabetes Mellitus Sister. Hypertension Daughter, Father.  Pregnancy / Birth History Marjean Donna, [place]; 08/11/2015 2:54 PM) Age at menarche 10 years. Age of menopause 89-50 Gravida 2 Irregular periods Maternal age 5-25 Para 2     Review of Systems ([place]; 08/11/2015 2:54 PM) General Not Present- Appetite Loss, Chills, Fatigue, Fever, Night Sweats, Weight Gain and Weight Loss. Skin Not Present- Change in Wart/Mole, Dryness, Hives, Jaundice, New Lesions, Non-Healing Wounds, Rash and Ulcer. HEENT Not Present- Earache, Hearing Loss, Hoarseness, Nose Bleed, Oral Ulcers, Ringing in the Ears, Seasonal Allergies, Sinus Pain, Sore Throat, Visual Disturbances, Wears glasses/contact lenses and Yellow Eyes. Respiratory Not Present- Bloody sputum, Chronic Cough, Difficulty Breathing, Snoring and Wheezing. Breast Not Present- Breast Mass, Breast Pain, Nipple Discharge and Skin  Changes. Cardiovascular Not Present- Chest Pain, Difficulty Breathing Lying Down, Leg Cramps, Palpitations, Rapid Heart Rate, Shortness of Breath and Swelling of Extremities. Gastrointestinal Not Present- Abdominal Pain, Bloating, Bloody Stool, Change in Bowel Habits, Chronic diarrhea, Constipation, Difficulty Swallowing, Excessive gas, Gets full quickly at meals, Hemorrhoids, Indigestion, Nausea, Rectal Pain and Vomiting. Female Genitourinary Present- Frequency, Nocturia and Urgency. Not Present- Painful Urination and Pelvic Pain. Musculoskeletal Not Present- Back Pain, Joint Pain, Joint Stiffness, Muscle Pain, Muscle Weakness and Swelling of Extremities. Neurological Not Present- Decreased Memory, Fainting, Headaches, Numbness, Seizures, Tingling, Tremor, Trouble walking and Weakness. Psychiatric Not Present- Anxiety, Bipolar, Change in Sleep Pattern, Depression, Fearful and Frequent crying. Endocrine Present-  Hair Changes and Hot flashes. Not Present- Cold Intolerance, Excessive Hunger, Heat Intolerance and New Diabetes. Hematology Not Present- Easy Bruising, Excessive bleeding, Gland problems, HIV and Persistent Infections.  Vitals (Sonya Bynum CMA; 08/11/2015 2:55 PM) 08/11/2015 2:54 PM Weight: 175 lb Height: 66in Body Surface Area: 1.89 m Body Mass Index: 28.25 kg/m  Temp.: 60F(Temporal)  Pulse: 75 (Regular)  BP: 126/80 (Sitting, Left Arm, Standard)      Physical Exam (Sharna Gabrys A. Khelani Kops MD; 08/11/2015 3:28 PM)  General Mental Status-Alert. General Appearance-Consistent with stated age. Hydration-Well hydrated. Voice-Normal.  Head and Neck Head-normocephalic, atraumatic with no lesions or palpable masses. Trachea-midline. Thyroid Gland Characteristics - normal size and consistency.  Chest and Lung Exam Chest and lung exam reveals -quiet, even and easy respiratory effort with no use of accessory muscles and on auscultation, normal breath sounds,  no adventitious sounds and normal vocal resonance. Inspection Chest Wall - Normal. Back - normal.  Breast Breast - Left-Symmetric, Non Tender, No Biopsy scars, no Dimpling, No Inflammation, No Lumpectomy scars, No Mastectomy scars, No Peau d' [place]. Breast - Right-Symmetric, Non Tender, No Biopsy scars, no Dimpling, No Inflammation, No Lumpectomy scars, No Mastectomy scars, No Peau d' [place]. Breast Lump-No Palpable Breast Mass.  Cardiovascular Cardiovascular examination reveals -normal heart sounds, regular rate and rhythm with no murmurs and normal pedal pulses bilaterally.  Musculoskeletal Normal Exam - Left-Upper Extremity Strength Normal and Lower Extremity Strength Normal. Normal Exam - Right-Upper Extremity Strength Normal and Lower Extremity Strength Normal.  Lymphatic Head & Neck  General Head & Neck Lymphatics: Bilateral - Description - Normal. Axillary  General Axillary Region: Bilateral - Description - Normal. Tenderness - Non Tender.    Assessment & Plan (Yordin Rhoda A. Jaiden Dinkins MD; 08/11/2015 3:29 PM)  BILATERAL BREAST CANCER (C50.911) Impression: Patient desires breast conservation bilaterally. I recommended bilateral needle localized lumpectomy and bilateral sentinel lymph node mapping. Risk of lumpectomy include bleeding, infection, seroma, more surgery, use of seed/wire, wound care, cosmetic deformity and the need for other treatments, death , blood clots, death. Pt agrees to proceed. Risk of sentinel lymph node mapping include bleeding, infection, lymphedema, shoulder pain. stiffness, dye allergy. cosmetic deformity , blood clots, death, need for more surgery. Pt agres to proceed.  Current Plans You are being scheduled for surgery - Our schedulers will call you.  You should hear from our office's scheduling department within 5 working days about the location, date, and time of surgery. We try to make accommodations for patient's preferences in scheduling  surgery, but sometimes the OR schedule or the surgeon's schedule prevents [place] from making those accommodations.  If you have not heard from our office [phone]) in 5 working days, call the office and ask for your surgeon's nurse.  If you have other questions about your diagnosis, plan, or surgery, call the office and ask for your surgeon's nurse.  Pt Education - [facility] Breast Cancer Information Given - [facility] "Breast Journey" Package We discussed the staging and pathophysiology of breast cancer. We discussed all of the different options for treatment for breast cancer including surgery, chemotherapy, radiation therapy, Herceptin, and antiestrogen therapy. We discussed a sentinel lymph node biopsy as she does not appear to having lymph node involvement right now. We discussed the performance of that with injection of radioactive tracer and blue dye. We discussed that she would have an incision underneath her axillary hairline. We discussed that there is a bout a 10-20% chance of having a positive node with a sentinel lymph node biopsy  and we will await the permanent pathology to make any other first further decisions in terms of her treatment. One of these options might be to return to the operating room to perform an axillary lymph node dissection. We discussed about a 1-2% risk lifetime of chronic shoulder pain as well as lymphedema associated with a sentinel lymph node biopsy. We discussed the options for treatment of the breast cancer which included lumpectomy versus a mastectomy. We discussed the performance of the lumpectomy with a wire placement. We discussed a 10-20% chance of a positive margin requiring reexcision in the operating room. We also discussed that she may need radiation therapy or antiestrogen therapy or both if she undergoes lumpectomy. We discussed the mastectomy and the postoperative care for that as well. We discussed that there is no difference in her survival whether she undergoes  lumpectomy with radiation therapy or antiestrogen therapy versus a mastectomy. There is a slight difference in the local recurrence rate being 3-5% with lumpectomy and about 1% with a mastectomy. We discussed the risks of operation including bleeding, infection, possible reoperation. She understands her further therapy will be based on what her stages at the time of her operation.  Pt Education - flb breast cancer surgery: discussed with patient and provided information. Pt Education - ABC (After Breast Cancer) Class Info: discussed with patient and provided information.

## 2015-08-22 ENCOUNTER — Telehealth: Payer: Self-pay | Admitting: *Deleted

## 2015-08-22 NOTE — Telephone Encounter (Signed)
-----   Message from Rockwell Germany, RN sent at 08/12/2015  3:53 PM EST ----- Regarding: Care Plan Patient was seen in [facility] 11/9.  She is scheduled for the following.    Surgery - 1/5  Bilateral Lumpectomy with [facility] Dr. Jana Hakim - 2/7  An oncotype will be sent pending final pathology.    Thanks, Varney Biles

## 2015-08-22 NOTE — Telephone Encounter (Signed)
-----   Message from Rockwell Germany, RN sent at 08/12/2015  3:53 PM EST ----- Regarding: Care Plan Patient was seen in [facility] 11/9.  She is scheduled for the following.    Surgery - 1/5  Bilateral Lumpectomy with [facility] Dr. Jana Hakim - 2/7  An oncotype will be sent pending final pathology.    Thanks, Varney Biles

## 2015-08-26 ENCOUNTER — Encounter (HOSPITAL_COMMUNITY)
Admission: RE | Admit: 2015-08-26 | Discharge: 2015-08-26 | Disposition: A | Discharge status: Home / Self Care | Payer: Medicare Other | Source: Ambulatory Visit | Attending: Surgery | Admitting: Surgery

## 2015-08-26 ENCOUNTER — Other Ambulatory Visit: Payer: Self-pay

## 2015-08-26 VITALS — BP 128/65 | HR 68 | Temp 97.9°F | Resp 20 | Ht 66.0 in | Wt 173.9 lb

## 2015-08-26 DIAGNOSIS — C50911 Malignant neoplasm of unspecified site of right female breast: Secondary | ICD-10-CM | POA: Diagnosis present

## 2015-08-26 DIAGNOSIS — E7800 Pure hypercholesterolemia, unspecified: Secondary | ICD-10-CM | POA: Diagnosis not present

## 2015-08-26 DIAGNOSIS — D649 Anemia, unspecified: Secondary | ICD-10-CM | POA: Diagnosis not present

## 2015-08-26 DIAGNOSIS — C50112 Malignant neoplasm of central portion of left female breast: Principal | ICD-10-CM

## 2015-08-26 DIAGNOSIS — Z90710 Acquired absence of both cervix and uterus: Secondary | ICD-10-CM | POA: Diagnosis not present

## 2015-08-26 DIAGNOSIS — Z170 Estrogen receptor positive status [ER+]: Secondary | ICD-10-CM | POA: Diagnosis not present

## 2015-08-26 DIAGNOSIS — C50111 Malignant neoplasm of central portion of right female breast: Secondary | ICD-10-CM

## 2015-08-26 DIAGNOSIS — I10 Essential (primary) hypertension: Secondary | ICD-10-CM | POA: Diagnosis not present

## 2015-08-26 DIAGNOSIS — C50312 Malignant neoplasm of lower-inner quadrant of left female breast: Secondary | ICD-10-CM | POA: Diagnosis not present

## 2015-08-26 LAB — COMPREHENSIVE METABOLIC PANEL
ALBUMIN: 4.1 g/dL (ref 3.5–5.0)
ALK PHOS: 102 U/L (ref 38–126)
ALT: 21 U/L (ref 14–54)
AST: 23 U/L (ref 15–41)
Anion gap: 8 (ref 5–15)
BUN: 32 mg/dL — AB (ref 6–20)
CALCIUM: 9.8 mg/dL (ref 8.9–10.3)
CO2: 25 mmol/L (ref 22–32)
CREATININE: 1.58 mg/dL — AB (ref 0.44–1.00)
Chloride: 106 mmol/L (ref 101–111)
GFR calc non Af Amer: 33 mL/min — ABNORMAL LOW (ref >60)
GFR, EST AFRICAN AMERICAN: 38 mL/min — AB (ref >60)
GLUCOSE: 105 mg/dL — AB (ref 65–99)
Potassium: 4.8 mmol/L (ref 3.5–5.1)
SODIUM: 139 mmol/L (ref 135–145)
Total Bilirubin: 0.5 mg/dL (ref 0.3–1.2)
Total Protein: 7.6 g/dL (ref 6.5–8.1)

## 2015-08-26 LAB — CBC WITH DIFFERENTIAL/PLATELET
BASOS ABS: 0 10*3/uL (ref 0.0–0.1)
Basophils Relative: 1 %
EOS ABS: 0.1 10*3/uL (ref 0.0–0.7)
Eosinophils Relative: 2 %
HCT: 30.2 % — ABNORMAL LOW (ref 36.0–46.0)
HEMOGLOBIN: 9.6 g/dL — AB (ref 12.0–15.0)
LYMPHS ABS: 2.6 10*3/uL (ref 0.7–4.0)
LYMPHS PCT: 49 %
MCH: 26.4 pg (ref 26.0–34.0)
MCHC: 31.8 g/dL (ref 30.0–36.0)
MCV: 83 fL (ref 78.0–100.0)
Monocytes Absolute: 0.5 10*3/uL (ref 0.1–1.0)
Monocytes Relative: 10 %
NEUTROS PCT: 38 %
Neutro Abs: 2 10*3/uL (ref 1.7–7.7)
Platelets: 248 10*3/uL (ref 150–400)
RBC: 3.64 MIL/uL — AB (ref 3.87–5.11)
RDW: 13.3 % (ref 11.5–15.5)
WBC: 5.3 10*3/uL (ref 4.0–10.5)

## 2015-08-26 LAB — GLUCOSE, CAPILLARY: Glucose-Capillary: 160 mg/dL — ABNORMAL HIGH (ref 65–99)

## 2015-08-26 NOTE — Pre-Procedure Instructions (Signed)
Christina [name]  08/26/2015      [id number] [address], [place] [address] Phone: [phone] Fax: [phone]    Your procedure is scheduled on 08/28/15.  Report to [facility] Admitting at 900 A.M.  Call this number if you have problems the morning of surgery:  [phone]   Remember:  Do not eat food or drink liquids after midnight.  Take these medicines the morning of surgery with A SIP OF WATER atenolol,tramadol if needed  STOP all herbel meds, nsaids (aleve,naproxen,advil,ibuprofen) starting Today including aspirin,vitamins   Do not wear jewelry, make-up or nail polish.  Do not wear lotions, powders, or perfumes.  You may wear deodorant.  Do not shave 48 hours prior to surgery.  Men may shave face and neck.  Do not bring valuables to the hospital.  [facility] is not responsible for any belongings or valuables.  Contacts, dentures or bridgework may not be worn into surgery.  Leave your suitcase in the car.  After surgery it may be brought to your room.  For patients admitted to the hospital, discharge time will be determined by your treatment team.  Patients discharged the day of surgery will not be allowed to drive home.   Name and phone number of your driver:  Special instructions:   Special Instructions: [facility] - Preparing for Surgery  Before surgery, you can play an important role.  Because skin is not sterile, your skin needs to be as free of germs as possible.  You can reduce the number of germs on you skin by washing with CHG (chlorahexidine gluconate) soap before surgery.  CHG is an antiseptic cleaner which kills germs and bonds with the skin to continue killing germs even after washing.  Please DO NOT use if you have an allergy to CHG or antibacterial soaps.  If your skin becomes reddened/irritated stop using the CHG and inform your nurse when you arrive at Short Stay.  Do not  shave (including legs and underarms) for at least 48 hours prior to the first CHG shower.  You may shave your face.  Please follow these instructions carefully:   1.  Shower with CHG Soap the night before surgery and the morning of Surgery.  2.  If you choose to wash your hair, wash your hair first as usual with your normal shampoo.  3.  After you shampoo, rinse your hair and body thoroughly to remove the Shampoo.  4.  Use CHG as you would any other liquid soap.  You can apply chg directly  to the skin and wash gently with scrungie or a clean washcloth.  5.  Apply the CHG Soap to your body ONLY FROM THE NECK DOWN.  Do not use on open wounds or open sores.  Avoid contact with your eyes ears, mouth and genitals (private parts).  Wash genitals (private parts)       with your normal soap.  6.  Wash thoroughly, paying special attention to the area where your surgery will be performed.  7.  Thoroughly rinse your body with warm water from the neck down.  8.  DO NOT shower/wash with your normal soap after using and rinsing off the CHG Soap.  9.  Pat yourself dry with a clean towel.            10.  Wear clean pajamas.  11.  Place clean sheets on your bed the night of your first shower and do not sleep with pets.  Day of Surgery  Do not apply any lotions/deodorants the morning of surgery.  Please wear clean clothes to the hospital/surgery center.  Please read over the following fact sheets that you were given. Pain Booklet, Coughing and Deep Breathing and Surgical Site Infection Prevention

## 2015-08-27 MED ORDER — CHLORHEXIDINE GLUCONATE 4 % EX LIQD: 1.0000 "application " | Freq: Once | CUTANEOUS | Status: DC

## 2015-08-27 MED ORDER — DEXTROSE 5 % IV SOLN
3.0000 g | INTRAVENOUS | Status: AC
  Administered 2015-08-28: 3 g via INTRAVENOUS
  Filled 2015-08-27: qty 3000

## 2015-08-27 NOTE — Progress Notes (Signed)
Anesthesia Chart Review: Patient is a 70 year old female scheduled for bilateral wire localized lumpectomy and bilateral SN mapping on 08/28/15 by Dr. Brantley Stage.  History includes bilateral breast cancer, non-smoker, HTN, hypercholesterolemia, anemia. PCP is Dr. Lillia Corporal with [facility] (some records scanned under Media tab; phone [phone], fax [phone]). She is due to see oncologist Dr. Jana Hakim on 09/30/15.   Meds include atenolol, ibuprofen, lisinopril, HCTZ, tramadol, Maxzide-25.  08/26/15 EKG: NSR.  Preoperative labs noted. H/H 9.6/30.2 (down from 11.0/33.5 on 07/02/15). BUN 32, Cr 1.58 (up from 26/1.4 on 07/02/15 and 19/0.81 on 05/13/15 at PCP office). She is on multiple agents that can affect her renal function.   Above discussed with anesthesiologist Dr. Smith Robert. For known breast cancer and planned procedure, would not delay case from an anesthesia standpoint but she will need close follow-up with her PCP for her renal function and anemia. Surgeon should also be aware of labs results. I have left a voice message for patient to contact me today in hopes that I can review labs with her and discuss need for follow-up with Dr. Alphonzo Grieve. He may want to hold or alter her medication regimen. I did call and speak with Latif at his office, and patient already has an appointment scheduled to see him on 09/01/15. I will fax them my notes and her recent labs. I also routed lab results with BUN/Cr trends to Dr. Brantley Stage so he can review with patient as well.   George Hugh [facility] Short Stay Center/Anesthesiology Phone [phone] 08/27/2015 1:23 PM

## 2015-08-28 ENCOUNTER — Ambulatory Visit (HOSPITAL_COMMUNITY): Payer: Medicare Other | Admitting: Anesthesiology

## 2015-08-28 ENCOUNTER — Ambulatory Visit
Admission: RE | Admit: 2015-08-28 | Discharge: 2015-08-28 | Disposition: A | Discharge status: Home / Self Care | Payer: Medicare Other | Source: Ambulatory Visit | Attending: Surgery | Admitting: Surgery

## 2015-08-28 ENCOUNTER — Ambulatory Visit (HOSPITAL_COMMUNITY)
Admission: RE | Admit: 2015-08-28 | Discharge: 2015-08-28 | Disposition: A | Discharge status: Home / Self Care | Payer: Medicare Other | Source: Ambulatory Visit | Attending: Surgery | Admitting: Surgery

## 2015-08-28 ENCOUNTER — Other Ambulatory Visit: Payer: Self-pay | Admitting: Surgery

## 2015-08-28 ENCOUNTER — Ambulatory Visit
Admit: 2015-08-28 | Discharge: 2015-08-28 | Disposition: A | Discharge status: Home / Self Care | Payer: Medicare Other | Attending: Surgery | Admitting: Surgery

## 2015-08-28 ENCOUNTER — Encounter (HOSPITAL_COMMUNITY)
Admission: RE | Disposition: A | Discharge status: Home / Self Care | Payer: Self-pay | Source: Ambulatory Visit | Attending: Surgery

## 2015-08-28 ENCOUNTER — Encounter (HOSPITAL_COMMUNITY): Payer: Self-pay | Admitting: Anesthesiology

## 2015-08-28 ENCOUNTER — Ambulatory Visit (HOSPITAL_COMMUNITY): Payer: Medicare Other | Admitting: Vascular Surgery

## 2015-08-28 DIAGNOSIS — C50112 Malignant neoplasm of central portion of left female breast: Principal | ICD-10-CM

## 2015-08-28 DIAGNOSIS — C50111 Malignant neoplasm of central portion of right female breast: Secondary | ICD-10-CM

## 2015-08-28 DIAGNOSIS — I10 Essential (primary) hypertension: Secondary | ICD-10-CM | POA: Insufficient documentation

## 2015-08-28 DIAGNOSIS — D649 Anemia, unspecified: Secondary | ICD-10-CM | POA: Insufficient documentation

## 2015-08-28 DIAGNOSIS — Z90710 Acquired absence of both cervix and uterus: Secondary | ICD-10-CM | POA: Insufficient documentation

## 2015-08-28 DIAGNOSIS — E7800 Pure hypercholesterolemia, unspecified: Secondary | ICD-10-CM | POA: Insufficient documentation

## 2015-08-28 DIAGNOSIS — C50312 Malignant neoplasm of lower-inner quadrant of left female breast: Secondary | ICD-10-CM | POA: Insufficient documentation

## 2015-08-28 DIAGNOSIS — Z170 Estrogen receptor positive status [ER+]: Secondary | ICD-10-CM | POA: Diagnosis not present

## 2015-08-28 DIAGNOSIS — C50211 Malignant neoplasm of upper-inner quadrant of right female breast: Secondary | ICD-10-CM

## 2015-08-28 DIAGNOSIS — C50919 Malignant neoplasm of unspecified site of unspecified female breast: Secondary | ICD-10-CM

## 2015-08-28 HISTORY — PX: BREAST LUMPECTOMY: SHX2

## 2015-08-28 HISTORY — DX: Malignant neoplasm of unspecified site of unspecified female breast: C50.919

## 2015-08-28 HISTORY — PX: BREAST LUMPECTOMY WITH NEEDLE LOCALIZATION AND AXILLARY SENTINEL LYMPH NODE BX: SHX5760

## 2015-08-28 SURGERY — BREAST LUMPECTOMY WITH NEEDLE LOCALIZATION AND AXILLARY SENTINEL LYMPH NODE BX
Anesthesia: Regional | Site: Breast | Laterality: Bilateral

## 2015-08-28 MED ORDER — PHENYLEPHRINE HCL 10 MG/ML IJ SOLN
INTRAMUSCULAR | Status: DC | PRN
  Administered 2015-08-28 (×4): 80 ug via INTRAVENOUS

## 2015-08-28 MED ORDER — FENTANYL CITRATE (PF) 100 MCG/2ML IJ SOLN
INTRAMUSCULAR | Status: AC
  Administered 2015-08-28: 100 ug
  Filled 2015-08-28: qty 2

## 2015-08-28 MED ORDER — BUPIVACAINE-EPINEPHRINE 0.25% -1:200000 IJ SOLN
INTRAMUSCULAR | Status: DC | PRN
  Administered 2015-08-28: 23 mL

## 2015-08-28 MED ORDER — TECHNETIUM TC 99M SULFUR COLLOID FILTERED
1.0000 | Freq: Once | INTRAVENOUS | Status: AC | PRN
  Administered 2015-08-28: 1 via INTRADERMAL

## 2015-08-28 MED ORDER — HYDROMORPHONE HCL 1 MG/ML IJ SOLN
0.2500 mg | INTRAMUSCULAR | Status: DC | PRN
Start: 2015-08-28 — End: 2015-08-28
  Administered 2015-08-28 (×2): 0.25 mg via INTRAVENOUS

## 2015-08-28 MED ORDER — LACTATED RINGERS IV SOLN
INTRAVENOUS | Status: DC | PRN
  Administered 2015-08-28 (×2): via INTRAVENOUS

## 2015-08-28 MED ORDER — ONDANSETRON HCL 4 MG/2ML IJ SOLN
INTRAMUSCULAR | Status: DC | PRN
  Administered 2015-08-28: 4 mg via INTRAVENOUS

## 2015-08-28 MED ORDER — BUPIVACAINE-EPINEPHRINE (PF) 0.25% -1:200000 IJ SOLN
INTRAMUSCULAR | Status: DC | PRN
  Administered 2015-08-28 (×2): 30 mL

## 2015-08-28 MED ORDER — METHYLENE BLUE 1 % INJ SOLN
INTRAMUSCULAR | Status: DC | PRN
  Administered 2015-08-28: 5 mL

## 2015-08-28 MED ORDER — LIDOCAINE HCL (CARDIAC) 20 MG/ML IV SOLN
INTRAVENOUS | Status: AC
  Filled 2015-08-28: qty 5

## 2015-08-28 MED ORDER — LIDOCAINE HCL (CARDIAC) 20 MG/ML IV SOLN
INTRAVENOUS | Status: DC | PRN
  Administered 2015-08-28: 30 mg via INTRAVENOUS

## 2015-08-28 MED ORDER — HEMOSTATIC AGENTS (NO CHARGE) OPTIME
TOPICAL | Status: DC | PRN
  Administered 2015-08-28: 1 via TOPICAL

## 2015-08-28 MED ORDER — SODIUM CHLORIDE 0.9 % IJ SOLN
INTRAMUSCULAR | Status: AC
  Filled 2015-08-28: qty 10

## 2015-08-28 MED ORDER — PROPOFOL 10 MG/ML IV BOLUS
INTRAVENOUS | Status: AC
  Filled 2015-08-28: qty 20

## 2015-08-28 MED ORDER — MIDAZOLAM HCL 2 MG/2ML IJ SOLN
INTRAMUSCULAR | Status: AC
  Administered 2015-08-28: 1 mg
  Filled 2015-08-28: qty 2

## 2015-08-28 MED ORDER — OXYCODONE-ACETAMINOPHEN 5-325 MG PO TABS: 1.0000 | ORAL_TABLET | ORAL | Status: DC | PRN

## 2015-08-28 MED ORDER — PROPOFOL 10 MG/ML IV BOLUS
INTRAVENOUS | Status: DC | PRN
  Administered 2015-08-28: 150 mg via INTRAVENOUS

## 2015-08-28 MED ORDER — FENTANYL CITRATE (PF) 250 MCG/5ML IJ SOLN
INTRAMUSCULAR | Status: AC
  Filled 2015-08-28: qty 5

## 2015-08-28 MED ORDER — METHYLENE BLUE 1 % INJ SOLN
INTRAMUSCULAR | Status: AC
  Filled 2015-08-28: qty 10

## 2015-08-28 MED ORDER — HYDROCODONE-ACETAMINOPHEN 7.5-325 MG PO TABS: 1.0000 | ORAL_TABLET | Freq: Once | ORAL | Status: DC | PRN

## 2015-08-28 MED ORDER — PHENYLEPHRINE HCL 10 MG/ML IJ SOLN
10.0000 mg | INTRAVENOUS | Status: DC | PRN
  Administered 2015-08-28: 25 ug/min via INTRAVENOUS

## 2015-08-28 MED ORDER — PROMETHAZINE HCL 25 MG/ML IJ SOLN: 6.2500 mg | INTRAMUSCULAR | Status: DC | PRN

## 2015-08-28 MED ORDER — FENTANYL CITRATE (PF) 100 MCG/2ML IJ SOLN
INTRAMUSCULAR | Status: DC | PRN
  Administered 2015-08-28: 50 ug via INTRAVENOUS
  Administered 2015-08-28 (×2): 100 ug via INTRAVENOUS

## 2015-08-28 MED ORDER — MIDAZOLAM HCL 2 MG/2ML IJ SOLN
INTRAMUSCULAR | Status: AC
  Filled 2015-08-28: qty 2

## 2015-08-28 MED ORDER — HYDROMORPHONE HCL 1 MG/ML IJ SOLN
INTRAMUSCULAR | Status: AC
  Filled 2015-08-28: qty 1

## 2015-08-28 SURGICAL SUPPLY — 53 items
APPLIER CLIP 9.375 MED OPEN (MISCELLANEOUS)
APR CLP MED 9.3 20 MLT OPN (MISCELLANEOUS)
BINDER BREAST LRG (GAUZE/BANDAGES/DRESSINGS) IMPLANT
BINDER BREAST XLRG (GAUZE/BANDAGES/DRESSINGS) IMPLANT
BLADE SURG 10 STRL SS (BLADE) ×2 IMPLANT
BLADE SURG 15 STRL LF DISP TIS (BLADE) IMPLANT
BLADE SURG 15 STRL SS (BLADE) ×3
CANISTER SUCTION 2500CC (MISCELLANEOUS) ×3 IMPLANT
CHLORAPREP W/TINT 26ML (MISCELLANEOUS) ×3 IMPLANT
CLIP APPLIE 9.375 MED OPEN (MISCELLANEOUS) IMPLANT
CONT SPEC 4OZ CLIKSEAL STRL BL (MISCELLANEOUS) ×3 IMPLANT
COVER PROBE W GEL 5X96 (DRAPES) ×3 IMPLANT
COVER SURGICAL LIGHT HANDLE (MISCELLANEOUS) ×3 IMPLANT
DEVICE DUBIN SPECIMEN MAMMOGRA (MISCELLANEOUS) ×3 IMPLANT
DRAPE CHEST BREAST 15X10 FENES (DRAPES) ×3 IMPLANT
DRSG PAD ABDOMINAL 8X10 ST (GAUZE/BANDAGES/DRESSINGS) ×4 IMPLANT
ELECT CAUTERY BLADE 6.4 (BLADE) ×3 IMPLANT
ELECT REM PT RETURN 9FT ADLT (ELECTROSURGICAL) ×3
ELECTRODE REM PT RTRN 9FT ADLT (ELECTROSURGICAL) ×1 IMPLANT
GLOVE BIO SURGEON STRL SZ7.5 (GLOVE) ×3 IMPLANT
GLOVE BIOGEL PI IND STRL 8 (GLOVE) IMPLANT
GLOVE BIOGEL PI IND STRL 8.5 (GLOVE) IMPLANT
GLOVE BIOGEL PI INDICATOR 8 (GLOVE) ×2
GLOVE BIOGEL PI INDICATOR 8.5 (GLOVE) ×2
GOWN STRL REUS W/ TWL LRG LVL3 (GOWN DISPOSABLE) ×2 IMPLANT
GOWN STRL REUS W/ TWL XL LVL3 (GOWN DISPOSABLE) ×1 IMPLANT
GOWN STRL REUS W/TWL LRG LVL3 (GOWN DISPOSABLE) ×6
GOWN STRL REUS W/TWL XL LVL3 (GOWN DISPOSABLE) ×3
KIT BASIN OR (CUSTOM PROCEDURE TRAY) ×3 IMPLANT
KIT MARKER MARGIN INK (KITS) IMPLANT
KIT ROOM TURNOVER OR (KITS) ×3 IMPLANT
LIQUID BAND (GAUZE/BANDAGES/DRESSINGS) ×3 IMPLANT
NDL 18GX1X1/2 (RX/OR ONLY) (NEEDLE) ×1 IMPLANT
NDL HYPO 25GX1X1/2 BEV (NEEDLE) ×2 IMPLANT
NEEDLE 18GX1X1/2 (RX/OR ONLY) (NEEDLE) ×3 IMPLANT
NEEDLE HYPO 25GX1X1/2 BEV (NEEDLE) ×6 IMPLANT
NS IRRIG 1000ML POUR BTL (IV SOLUTION) ×3 IMPLANT
PACK SURGICAL SETUP 50X90 (CUSTOM PROCEDURE TRAY) ×3 IMPLANT
PAD ARMBOARD 7.5X6 YLW CONV (MISCELLANEOUS) ×3 IMPLANT
PENCIL BUTTON HOLSTER BLD 10FT (ELECTRODE) ×3 IMPLANT
SPONGE LAP 18X18 X RAY DECT (DISPOSABLE) ×3 IMPLANT
SUT MNCRL AB 4-0 PS2 18 (SUTURE) ×5 IMPLANT
SUT SILK 2 0 SH (SUTURE) IMPLANT
SUT VIC AB 3-0 SH 18 (SUTURE) ×4 IMPLANT
SUT VIC AB 3-0 SH 27 (SUTURE) ×3
SUT VIC AB 3-0 SH 27XBRD (SUTURE) ×1 IMPLANT
SYR BULB 3OZ (MISCELLANEOUS) ×3 IMPLANT
SYR CONTROL 10ML LL (SYRINGE) ×6 IMPLANT
TOWEL OR 17X24 6PK STRL BLUE (TOWEL DISPOSABLE) ×3 IMPLANT
TOWEL OR 17X26 10 PK STRL BLUE (TOWEL DISPOSABLE) ×3 IMPLANT
TUBE CONNECTING 12'X1/4 (SUCTIONS) ×1
TUBE CONNECTING 12X1/4 (SUCTIONS) ×2 IMPLANT
YANKAUER SUCT BULB TIP NO VENT (SUCTIONS) ×3 IMPLANT

## 2015-08-28 NOTE — Anesthesia Postprocedure Evaluation (Signed)
Anesthesia Post Note  Patient: Christina [name]  Procedure(s) Performed: Procedure(s) (LRB): BILATERAL BREAST LUMPECTOMY WITH BILATERAL  NEEDLE LOCALIZATION AND BILATERAL AXILLARY SENTINEL LYMPH NODE BX (Bilateral)  Patient location during evaluation: PACU Anesthesia Type: General Level of consciousness: awake and alert, patient cooperative and oriented Pain management: pain level controlled Vital Signs Assessment: post-procedure vital signs reviewed and stable Respiratory status: spontaneous breathing, nonlabored ventilation and respiratory function stable Cardiovascular status: blood pressure returned to baseline and stable Postop Assessment: no signs of nausea or vomiting Anesthetic complications: no    Last Vitals:  Filed Vitals:   08/28/15 1439 08/28/15 1454  BP: 138/87 155/77  Pulse: 59 60  Temp:    Resp: 11 12    Last Pain: There were no vitals filed for this visit.               Midge Minium

## 2015-08-28 NOTE — Op Note (Signed)
Preoperative diagnosis: Bilateral stage I breast cancer  Postop diagnosis: Same  Procedure: Bilateral needle localized lumpectomy with bilateral axillary sentinel lymph node mapping  Surgeon: Erroll Luna M.D.  Anesthesia: LMA with 0.25% Sensorcaine local with epinephrine  EBL: Minimal  Specimen #1 left breast mass wire and clip to pathology #2 right breast masses time 2 With clips and wires radiograph #3 left axillary sentinel lymph nodes #4  Right axillary SLN times 2     Drains: None  Indications for procedure: The patient is a 70 year old female with bilateral stage I breast cancers. She elected for breast conservation bilaterally.The procedure has been discussed with the patient. Alternatives to surgery have been discussed with the patient.  Risks of surgery include bleeding,  Infection,  Seroma formation, death,  and the need for further surgery.   The patient understands and wishes to proceed.Sentinel lymph node mapping and dissection has been discussed with the patient.  Risk of bleeding,  Infection,  Seroma formation,  Additional procedures,,  Shoulder weakness ,  Shoulder stiffness,  Nerve and blood vessel injury and reaction to the mapping dyes have been discussed.  Alternatives to surgery have been discussed with the patient.  The patient agrees to proceed.  Description of procedure: Patient met in holding area and questions answered. She underwent wire localization bilaterally by radiology. She underwent bilateral nuclear medicine injections. Questions are answered. She's taken back to the operative room and placed upon the OR table. After induction of LMA anesthesia, the upper chest and armpit regions were prepped and draped in a sterile fashion. Timeout was done. Less I was done first.  Transverse incision was made from the medial wire the left breast and all tissue around the wire was excised. The clip and wire and the specimen sent to pathology. Gross margins negative. Cavity  was clipped and closed with 3-0 Vicryl for Monocryl. The left axilla was evaluated with a neoprobe. Hotspot identified in transverse incision was made the left axilla. Dissection was carried down into level one axillary  nodes and 1 hot nodes identified and removed.  The right side was done. Wires exited the breast in the lateral portion of the right breast. Curvilinear incision was made around both wires. All tissue around both wires was excised with grossly negative margin. Radiograph revealed both biopsy clip to be in the specimen. Cavity is irrigated and closed after being clipped with 3-0 Vicryl for Monocryl. 4 mL of methylene blue dye were injected in the subareolar position. Massage for 5 minutes. Neoprobe was used incision was made for sentinel lymph node mapping. Dissection was carried into level I axilla no patient. 2 hot nodes were identified and removed.  Background counts approached 0. Hemostasis achieved with cautery and Surgicel snow. We was closed with 3-0 Vicryl and 4-0 Monocryl. All final counts found to be correct. The gurney supply and breast binder placed.  Patient was extubated taken recovery in satisfactory condition. All final counts found to be correct.

## 2015-08-28 NOTE — Interval H&P Note (Signed)
History and Physical Interval Note:  08/28/2015 10:49 AM  Christina [name]  has presented today for surgery, with the diagnosis of BILATERAL BREAST CANCER  The various methods of treatment have been discussed with the patient and family. After consideration of risks, benefits and other options for treatment, the patient has consented to  Procedure(s): BILATERAL BREAST LUMPECTOMY WITH BILATERAL  NEEDLE LOCALIZATION AND BILATERAL AXILLARY SENTINEL LYMPH NODE BX (Bilateral) as a surgical intervention .  The patient's history has been reviewed, patient examined, no change in status, stable for surgery.  I have reviewed the patient's chart and labs.  Questions were answered to the patient's satisfaction.     Nolyn Swab A.

## 2015-08-28 NOTE — H&P (View-Only) (Signed)
Christina [name] 08/11/2015 2:54 PM Location: [facility] Patient #: [id number] DOB: 08-20-1946 Single / Language: Christina [name] / Race: White Female  History of Present Illness [name] A. Younique Casad MD; 08/11/2015 3:31 PM) Patient words: breast f/u Patient returns for follow-up. She was seen last month in the multidisciplinary breast clinic due to right breast cancer. MRI showed an abnormality in the left breast and this was core biopsied and found to be invasive ductal carcinoma. This was 1.4 cm diameter. She returns for follow-up and surgical planning. She has no new complaints today.     CLINICAL DATA: 70 year old female who was recall from screening mammography for an abnormality in the right breast. Subsequently, an area of distortion was identified on tomosynthesis images. Sonographically, 2 adjacent masses were identified, and biopsied on 06/13/2015. Pathology results indicated invasive ductal carcinoma and DCIS at the 2 sites in the lateral right breast. This MRI is being performed due to dense breast tissue. Currently, the patient desires breast conserving therapy. LABS: Creatinine 1.4 milligrams/deciliter and GFR of 46 on 07/02/2015. EXAM: BILATERAL BREAST MRI WITH AND WITHOUT CONTRAST TECHNIQUE: Multiplanar, multisequence MR images of both breasts were obtained prior to and following the intravenous administration of 16 ml of MultiHance. THREE-DIMENSIONAL MR IMAGE RENDERING ON INDEPENDENT WORKSTATION: Three-dimensional MR images were rendered by post-processing of the original MR data on an independent workstation. The three-dimensional MR images were interpreted, and findings are reported in the following complete MRI report for this study. Three dimensional images were evaluated at the independent DynaCad workstation COMPARISON: No prior breast MRI available for comparison. Correlation made with prior mammograms, including 06/13/2015. FINDINGS: Breast composition: d.  Extreme fibroglandular tissue. Background parenchymal enhancement: Moderate. Right breast: In the central to slightly lateral right breast, middle depth there is a 1.5 x 1.6 x 1.4 cm enhancing mass with an internal site of susceptibility, compatible with biopsy proven malignancy. Anterior and slightly superior and lateral to the more dominant mass is a smaller enhancing mass with a focus of internal susceptibility, compatible with the second site of biopsy proven malignancy. This mass measures 0.9 x 0.7 x 0.8 cm. Together, these masses span 4.0 cm in an oblique anterior to posterior dimension. No other enhancing masses are identified in the right breast. Left breast: In the lower-inner quadrant of the left breast, posterior depth there is a 1.4 x 0.8 x 1.3 cm suspicious enhancing spiculated mass. Lymph nodes: No abnormal appearing lymph nodes. Ancillary findings: None. IMPRESSION: 1. There is a suspicious 1.4 cm spiculated mass in the lower-inner quadrant of the left breast. 2. The biopsy-proven sites of malignancy in the right breast are identified measuring 1.6 and 0.9 cm respectively. No additional sites of disease are identified in the right breast. RECOMMENDATION: Second-look ultrasound is recommended to identify the 1.4 cm mass in the lower-inner quadrant of the left breast for biopsy. If not identified sonographically, MRI guided biopsy will be recommended. BI-RADS CATEGORY BI-RADS 4: Suspicious. Electronically Signed By: Ammie Ferrier M.D. On: 07/09/2015 10:13   CLINICAL DATA: 70 year old female who was recall from screening mammography for an abnormality in the right breast. Subsequently, an area of distortion was identified on tomosynthesis images. Sonographically, 2 adjacent masses were identified, and biopsied on 06/13/2015. Pathology results indicated invasive ductal carcinoma and DCIS at the 2 sites in the lateral right breast. This MRI is being performed due to  dense breast tissue. Currently, the patient desires breast conserving therapy. LABS: Creatinine 1.4 milligrams/deciliter and GFR of 46 on 07/02/2015. EXAM: BILATERAL  BREAST MRI WITH AND WITHOUT CONTRAST TECHNIQUE: Multiplanar, multisequence MR images of both breasts were obtained prior to and following the intravenous administration of 16 ml of MultiHance. THREE-DIMENSIONAL MR IMAGE RENDERING ON INDEPENDENT WORKSTATION: Three-dimensional MR images were rendered by post-processing of the original MR data on an independent workstation. The three-dimensional MR images were interpreted, and findings are reported in the following complete MRI report for this study. Three dimensional images were evaluated at the independent DynaCad workstation COMPARISON: No prior breast MRI available for comparison. Correlation made with prior mammograms, including 06/13/2015. FINDINGS: Breast composition: d. Extreme fibroglandular tissue. Background parenchymal enhancement: Moderate. Right breast: In the central to slightly lateral right breast, middle depth there is a 1.5 x 1.6 x 1.4 cm enhancing mass with an internal site of susceptibility, compatible with biopsy proven malignancy. Anterior and slightly superior and lateral to the more dominant mass is a smaller enhancing mass with a focus of internal susceptibility, compatible with the second site of biopsy proven malignancy. This mass measures 0.9 x 0.7 x 0.8 cm. Together, these masses span 4.0 cm in an oblique anterior to posterior dimension. No other enhancing masses are identified in the right breast. Left breast: In the lower-inner quadrant of the left breast, posterior depth there is a 1.4 x 0.8 x 1.3 cm suspicious enhancing spiculated mass. Lymph nodes: No abnormal appearing lymph nodes. Ancillary findings: None. IMPRESSION: 1. There is a suspicious 1.4 cm spiculated mass in the lower-inner quadrant of the left breast. 2. The biopsy-proven sites  of malignancy in the right breast are identified measuring 1.6 and 0.9 cm respectively. No additional sites of disease are identified in the right breast. RECOMMENDATION: Second-look ultrasound is recommended to identify the 1.4 cm mass in the lower-inner quadrant of the left breast for biopsy. If not identified sonographically, MRI guided biopsy will be recommended. BI-RADS CATEGORY BI-RADS 4: Suspicious. Electronically Signed By: Ammie Ferrier M.D. On: 07/09/2015 10:13         ADDITIONAL INFORMATION: PROGNOSTIC INDICATORS Results: IMMUNOHISTOCHEMICAL AND MORPHOMETRIC ANALYSIS PERFORMED MANUALLY Estrogen Receptor: 100%, POSITIVE, STRONG STAINING INTENSITY Progesterone Receptor: 95%, POSITIVE, STRONG STAINING INTENSITY Proliferation Marker Ki67: 5% REFERENCE RANGE ESTROGEN RECEPTOR NEGATIVE 0% POSITIVE =>1% REFERENCE RANGE PROGESTERONE RECEPTOR NEGATIVE 0% POSITIVE =>1% All controls stained appropriately Enid Cutter MD Pathologist, Electronic Signature ( Signed 08/05/2015) FLUORESCENCE IN-SITU HYBRIDIZATION Results: HER2 - NEGATIVE RATIO OF HER2/CEP17 SIGNALS 1.24 AVERAGE HER2 COPY NUMBER PER CELL 2.05 Reference Range: NEGATIVE HER2/CEP17 Ratio <2.0 and average HER2 copy number <4.0 EQUIVOCAL HER2/CEP17 Ratio <2.0 and average HER2 copy number >=4.0 and <6.0 1 of 3 FINAL for Kuehl, Marybella L ([id number])  Diagnosis Breast, left, needle core biopsy, 8:00 o'clock INVASIVE DUCTAL CARCINOMA, GRADE 1 TO 2 DUCTAL CARCINOMA IN SITU, GRADE 2      Diagnosis 1. Breast, right, needle core biopsy, 9:00 o'clock INVASIVE AND IN SITU MAMMARY CARCINOMA 2. Breast, right, needle core biopsy, 9:00 o'clock INVASIVE AND IN SITU MAMMARY CARCINOMA                    ADDITIONAL INFORMATION: 1. Carcinoma stained positive for E-cadherin and support invasive ductal carcinoma. (ZLL:gt, 06/17/15) Claudette Laws MD Pathologist, Electronic Signature ( Signed  06/19/2015) 2. FLUORESCENCE IN-SITU HYBRIDIZATION Results: HER2 - NEGATIVE RATIO OF HER2/CEP17 SIGNALS 1.15 AVERAGE HER2 COPY NUMBER PER CELL 1.95 Reference Range: NEGATIVE HER2/CEP17 Ratio <2.0 and average HER2 copy number <4.0 EQUIVOCAL HER2/CEP17 Ratio <2.0 and average HER2 copy number >=4.0 and <6.0 POSITIVE HER2/CEP17 Ratio >=2.0 or <2.0 and  average HER2 copy number >=6.0 Claudette Laws MD Pathologist, Electronic Signature ( Signed 06/19/2015) 2. PROGNOSTIC INDICATORS Results: IMMUNOHISTOCHEMICAL AND MORPHOMETRIC ANALYSIS PERFORMED MANUALLY Estrogen Receptor: 100%, POSITIVE, STRONG STAINING INTENSITY Progesterone Receptor: 100%, POSITIVE, STRONG STAINING INTENSITY Proliferation Marker Ki67: 5% 1 of 3 FINAL for Facundo, Amorita L ([id number]) ADDITIONAL INFORMATION:(continued) REFERENCE RANGE ESTROGEN RECEPTOR NEGATIVE 0% POSITIVE =>1% REFERENCE RANGE PROGESTERONE RECEPTOR NEGATIVE 0% POSITIVE =>1% All controls stained appropriately Enid Cutter MD Pathologist, Electronic Signature ( Signed 06/17/2015) FINAL DIAGNOSIS Diagnosis 1. Breast, right, needle core biopsy, 9:00 o'clock INVASIVE AND IN SITU MAMMARY CARCINOMA 2. Breast, right, needle core biopsy, 9:00 o'clock INVASIVE AND IN SITU MAMMARY CARCINOMA Microscopic Comment 1. and 2. E-Cadherin and Breast prognostic profile has been ordered. The [facility] has been informed (06/16/2015). Diagnosis of malignancy confirmed with Dr. Avis Epley. Casimer Lanius MD Pathologist, Electronic Signature (Case signed 06/16/2015) Specimen Gross and Clinical Information Specimen Comment 1. In formalin 3:45; mass 2. Mass Specimen(s) Obtained: 1. Breast, right, needle core biopsy, 9:00 o'clock 2. Breast, right, needle core biopsy, 9:00 o'clock Specimen Clinical Information 1. Suspect IMC 2. Suspect IMC 2 of.  The patient is a 70 year old female.   Other Problems Marjean Donna, CMA; 08/11/2015 2:54  PM) High blood pressure Hypercholesterolemia  Past Surgical History Marjean Donna, CMA; 08/11/2015 2:54 PM) Hysterectomy (not due to cancer) - Complete  Diagnostic Studies History Marjean Donna, CMA; 08/11/2015 2:54 PM) Colonoscopy never Mammogram within last year Pap Smear 1-5 years ago  Allergies Marjean Donna, CMA; 08/11/2015 2:55 PM) Aspirin *ANALGESICS - NonNarcotic*  Medication History (Sonya Bynum, CMA; 08/11/2015 2:55 PM) Atenolol (25MG Tablet, Oral) Active. HydroCHLOROthiazide (25MG Tablet, Oral) Active. Ibuprofen (800MG Tablet, Oral) Active. Lisinopril (10MG Tablet, Oral) Active. TraMADol HCl (50MG Tablet, Oral) Active. Triamterene-HCTZ (37.5-25MG Capsule, Oral) Active. Medications Reconciled  Social History Marjean Donna, CMA; 08/11/2015 2:54 PM) Caffeine use Carbonated beverages, Coffee, Tea. No alcohol use No drug use Tobacco use Never smoker.  Family History Marjean Donna, [place]; 08/11/2015 2:54 PM) Cancer Brother. Cervical Cancer Mother. Depression Daughter. Diabetes Mellitus Sister. Hypertension Daughter, Father.  Pregnancy / Birth History Marjean Donna, [place]; 08/11/2015 2:54 PM) Age at menarche 79 years. Age of menopause 66-50 Gravida 2 Irregular periods Maternal age 26-25 Para 2     Review of Systems ([place]; 08/11/2015 2:54 PM) General Not Present- Appetite Loss, Chills, Fatigue, Fever, Night Sweats, Weight Gain and Weight Loss. Skin Not Present- Change in Wart/Mole, Dryness, Hives, Jaundice, New Lesions, Non-Healing Wounds, Rash and Ulcer. HEENT Not Present- Earache, Hearing Loss, Hoarseness, Nose Bleed, Oral Ulcers, Ringing in the Ears, Seasonal Allergies, Sinus Pain, Sore Throat, Visual Disturbances, Wears glasses/contact lenses and Yellow Eyes. Respiratory Not Present- Bloody sputum, Chronic Cough, Difficulty Breathing, Snoring and Wheezing. Breast Not Present- Breast Mass, Breast Pain, Nipple Discharge and Skin  Changes. Cardiovascular Not Present- Chest Pain, Difficulty Breathing Lying Down, Leg Cramps, Palpitations, Rapid Heart Rate, Shortness of Breath and Swelling of Extremities. Gastrointestinal Not Present- Abdominal Pain, Bloating, Bloody Stool, Change in Bowel Habits, Chronic diarrhea, Constipation, Difficulty Swallowing, Excessive gas, Gets full quickly at meals, Hemorrhoids, Indigestion, Nausea, Rectal Pain and Vomiting. Female Genitourinary Present- Frequency, Nocturia and Urgency. Not Present- Painful Urination and Pelvic Pain. Musculoskeletal Not Present- Back Pain, Joint Pain, Joint Stiffness, Muscle Pain, Muscle Weakness and Swelling of Extremities. Neurological Not Present- Decreased Memory, Fainting, Headaches, Numbness, Seizures, Tingling, Tremor, Trouble walking and Weakness. Psychiatric Not Present- Anxiety, Bipolar, Change in Sleep Pattern, Depression, Fearful and Frequent crying. Endocrine Present-  Hair Changes and Hot flashes. Not Present- Cold Intolerance, Excessive Hunger, Heat Intolerance and New Diabetes. Hematology Not Present- Easy Bruising, Excessive bleeding, Gland problems, HIV and Persistent Infections.  Vitals (Sonya Bynum CMA; 08/11/2015 2:55 PM) 08/11/2015 2:54 PM Weight: 175 lb Height: 66in Body Surface Area: 1.89 m Body Mass Index: 28.25 kg/m  Temp.: 5F(Temporal)  Pulse: 75 (Regular)  BP: 126/80 (Sitting, Left Arm, Standard)      Physical Exam (Saad Buhl A. Laquinda Moller MD; 08/11/2015 3:28 PM)  General Mental Status-Alert. General Appearance-Consistent with stated age. Hydration-Well hydrated. Voice-Normal.  Head and Neck Head-normocephalic, atraumatic with no lesions or palpable masses. Trachea-midline. Thyroid Gland Characteristics - normal size and consistency.  Chest and Lung Exam Chest and lung exam reveals -quiet, even and easy respiratory effort with no use of accessory muscles and on auscultation, normal breath sounds,  no adventitious sounds and normal vocal resonance. Inspection Chest Wall - Normal. Back - normal.  Breast Breast - Left-Symmetric, Non Tender, No Biopsy scars, no Dimpling, No Inflammation, No Lumpectomy scars, No Mastectomy scars, No Peau d' Orange. Breast - Right-Symmetric, Non Tender, No Biopsy scars, no Dimpling, No Inflammation, No Lumpectomy scars, No Mastectomy scars, No Peau d' Orange. Breast Lump-No Palpable Breast Mass.  Cardiovascular Cardiovascular examination reveals -normal heart sounds, regular rate and rhythm with no murmurs and normal pedal pulses bilaterally.  Musculoskeletal Normal Exam - Left-Upper Extremity Strength Normal and Lower Extremity Strength Normal. Normal Exam - Right-Upper Extremity Strength Normal and Lower Extremity Strength Normal.  Lymphatic Head & Neck  General Head & Neck Lymphatics: Bilateral - Description - Normal. Axillary  General Axillary Region: Bilateral - Description - Normal. Tenderness - Non Tender.    Assessment & Plan (Gale Hulse A. Madeliene Tejera MD; 08/11/2015 3:29 PM)  BILATERAL BREAST CANCER (C50.911) Impression: Patient desires breast conservation bilaterally. I recommended bilateral needle localized lumpectomy and bilateral sentinel lymph node mapping. Risk of lumpectomy include bleeding, infection, seroma, more surgery, use of seed/wire, wound care, cosmetic deformity and the need for other treatments, death , blood clots, death. Pt agrees to proceed. Risk of sentinel lymph node mapping include bleeding, infection, lymphedema, shoulder pain. stiffness, dye allergy. cosmetic deformity , blood clots, death, need for more surgery. Pt agres to proceed.  Current Plans You are being scheduled for surgery - Our schedulers will call you.  You should hear from our office's scheduling department within 5 working days about the location, date, and time of surgery. We try to make accommodations for patient's preferences in scheduling  surgery, but sometimes the OR schedule or the surgeon's schedule prevents [place] from making those accommodations.  If you have not heard from our office [phone]) in 5 working days, call the office and ask for your surgeon's nurse.  If you have other questions about your diagnosis, plan, or surgery, call the office and ask for your surgeon's nurse.  Pt Education - [facility] Breast Cancer Information Given - [facility] "Breast Journey" Package We discussed the staging and pathophysiology of breast cancer. We discussed all of the different options for treatment for breast cancer including surgery, chemotherapy, radiation therapy, Herceptin, and antiestrogen therapy. We discussed a sentinel lymph node biopsy as she does not appear to having lymph node involvement right now. We discussed the performance of that with injection of radioactive tracer and blue dye. We discussed that she would have an incision underneath her axillary hairline. We discussed that there is a bout a 10-20% chance of having a positive node with a sentinel lymph node biopsy  and we will await the permanent pathology to make any other first further decisions in terms of her treatment. One of these options might be to return to the operating room to perform an axillary lymph node dissection. We discussed about a 1-2% risk lifetime of chronic shoulder pain as well as lymphedema associated with a sentinel lymph node biopsy. We discussed the options for treatment of the breast cancer which included lumpectomy versus a mastectomy. We discussed the performance of the lumpectomy with a wire placement. We discussed a 10-20% chance of a positive margin requiring reexcision in the operating room. We also discussed that she may need radiation therapy or antiestrogen therapy or both if she undergoes lumpectomy. We discussed the mastectomy and the postoperative care for that as well. We discussed that there is no difference in her survival whether she undergoes  lumpectomy with radiation therapy or antiestrogen therapy versus a mastectomy. There is a slight difference in the local recurrence rate being 3-5% with lumpectomy and about 1% with a mastectomy. We discussed the risks of operation including bleeding, infection, possible reoperation. She understands her further therapy will be based on what her stages at the time of her operation.  Pt Education - [facility] breast cancer surgery: discussed with patient and provided information. Pt Education - [facility] (After Breast Cancer) Class Info: discussed with patient and provided information.

## 2015-08-28 NOTE — Discharge Instructions (Signed)
[facility] °Office Phone Number [phone] ° °BREAST BIOPSY/ PARTIAL MASTECTOMY: POST OP INSTRUCTIONS ° °Always review your discharge instruction sheet given to you by the facility where your surgery was performed. ° °IF YOU HAVE DISABILITY OR FAMILY LEAVE FORMS, YOU MUST BRING THEM TO THE OFFICE FOR PROCESSING.  DO NOT GIVE THEM TO YOUR DOCTOR. ° °1. A prescription for pain medication may be given to you upon discharge.  Take your pain medication as prescribed, if needed.  If narcotic pain medicine is not needed, then you may take acetaminophen (Tylenol) or ibuprofen (Advil) as needed. °2. Take your usually prescribed medications unless otherwise directed °3. If you need a refill on your pain medication, please contact your pharmacy.  They will contact our office to request authorization.  Prescriptions will not be filled after 5pm or on week-ends. °4. You should eat very light the first 24 hours after surgery, such as soup, crackers, pudding, etc.  Resume your normal diet the day after surgery. °5. Most patients will experience some swelling and bruising in the breast.  Ice packs and a good support bra will help.  Swelling and bruising can take several days to resolve.  °6. It is common to experience some constipation if taking pain medication after surgery.  Increasing fluid intake and taking a stool softener will usually help or prevent this problem from occurring.  A mild laxative (Milk of Magnesia or Miralax) should be taken according to package directions if there are no bowel movements after 48 hours. °7. Unless discharge instructions indicate otherwise, you may remove your bandages 24-48 hours after surgery, and you may shower at that time.  You may have steri-strips (small skin tapes) in place directly over the incision.  These strips should be left on the skin for 7-10 days.  If your surgeon used skin glue on the incision, you may shower in 24 hours.  The glue will flake off over the  next 2-3 weeks.  Any sutures or staples will be removed at the office during your follow-up visit. °8. ACTIVITIES:  You may resume regular daily activities (gradually increasing) beginning the next day.  Wearing a good support bra or sports bra minimizes pain and swelling.  You may have sexual intercourse when it is comfortable. °a. You may drive when you no longer are taking prescription pain medication, you can comfortably wear a seatbelt, and you can safely maneuver your car and apply brakes. °b. RETURN TO WORK:  ______________________________________________________________________________________ °9. You should see your doctor in the office for a follow-up appointment approximately two weeks after your surgery.  Your doctor’s nurse will typically make your follow-up appointment when she calls you with your pathology report.  Expect your pathology report 2-3 business days after your surgery.  You may call to check if you do not hear from us after three days. °10. OTHER INSTRUCTIONS: _______________________________________________________________________________________________ _____________________________________________________________________________________________________________________________________ °_____________________________________________________________________________________________________________________________________ °_____________________________________________________________________________________________________________________________________ ° °WHEN TO CALL YOUR DOCTOR: °1. Fever over 101.0 °2. Nausea and/or vomiting. °3. Extreme swelling or bruising. °4. Continued bleeding from incision. °5. Increased pain, redness, or drainage from the incision. ° °The clinic staff is available to answer your questions during regular business hours.  Please don’t hesitate to call and ask to speak to one of the nurses for clinical concerns.  If you have a medical emergency, go to the nearest  emergency room or call 911.  A surgeon from [facility] is always on call at the hospital. ° °For further questions, please visit [email or web address]  °

## 2015-08-28 NOTE — Anesthesia Preprocedure Evaluation (Addendum)
Anesthesia Evaluation  Patient identified by MRN, date of birth, ID band Patient awake    Reviewed: Allergy & Precautions, NPO status , Patient's Chart, lab work & pertinent test results, reviewed documented beta blocker date and time   Airway Mallampati: II  TM Distance: >3 FB Neck ROM: Full    Dental   Pulmonary neg pulmonary ROS,    breath sounds clear to auscultation       Cardiovascular hypertension, Pt. on medications and Pt. on home beta blockers  Rhythm:Regular Rate:Normal     Neuro/Psych negative neurological ROS     GI/Hepatic negative GI ROS, Neg liver ROS,   Endo/Other  negative endocrine ROS  Renal/GU Renal InsufficiencyRenal disease     Musculoskeletal   Abdominal   Peds  Hematology  (+) anemia ,   Anesthesia Other Findings   Reproductive/Obstetrics                          Lab Results  Component Value Date   WBC 5.3 08/26/2015   HGB 9.6* 08/26/2015   HCT 30.2* 08/26/2015   MCV 83.0 08/26/2015   PLT 248 08/26/2015   Lab Results  Component Value Date   CREATININE 1.58* 08/26/2015   BUN 32* 08/26/2015   NA 139 08/26/2015   K 4.8 08/26/2015   CL 106 08/26/2015   CO2 25 08/26/2015  '  Anesthesia Physical Anesthesia Plan  ASA: III  Anesthesia Plan: General and Regional   Post-op Pain Management: GA combined w/ Regional for post-op pain   Induction: Intravenous  Airway Management Planned: LMA  Additional Equipment:   Intra-op Plan:   Post-operative Plan: Extubation in OR  Informed Consent: I have reviewed the patients History and Physical, chart, labs and discussed the procedure including the risks, benefits and alternatives for the proposed anesthesia with the patient or authorized representative who has indicated his/her understanding and acceptance.     Plan Discussed with: CRNA and Surgeon  Anesthesia Plan Comments:         Anesthesia Quick  Evaluation

## 2015-08-28 NOTE — Anesthesia Procedure Notes (Addendum)
Anesthesia Regional Block:  Pectoralis block  Pre-Anesthetic Checklist: ,, timeout performed, Correct Patient, Correct Site, Correct Laterality, Correct Procedure, Correct Position, site marked, Risks and benefits discussed,  Surgical consent,  Pre-op evaluation,  At surgeon's request and post-op pain management  Laterality: Right and Left  Prep: chloraprep       Needles:   Needle Type: Echogenic Needle     Needle Length: 9cm 9 cm Needle Gauge: 21 and 21 G    Additional Needles:  Procedures: ultrasound guided (picture in chart) Pectoralis block Narrative:  Start time: 08/28/2015 11:42 AM End time: 08/28/2015 12:01 PM Injection made incrementally with aspirations every 5 mL.  Performed by: Personally  Anesthesiologist: Suzette Battiest  Additional Notes: Bilateral pecs blocks performed under direct ultrasound guidance. Risks and benefits discussed prior to the procedure. Pt tolerated well with no immediate complications.   Procedure Name: LMA Insertion Date/Time: 08/28/2015 12:41 PM Performed by: Greggory Stallion, Vennie Waymire L Pre-anesthesia Checklist: Patient identified, Emergency Drugs available, Suction available, Timeout performed and Patient being monitored Patient Re-evaluated:Patient Re-evaluated prior to inductionOxygen Delivery Method: Circle system utilized Preoxygenation: Pre-oxygenation with 100% oxygen Intubation Type: IV induction Ventilation: Mask ventilation without difficulty LMA: LMA inserted LMA Size: 4.0 Number of attempts: 1 Placement Confirmation: positive ETCO2 and breath sounds checked- equal and bilateral Tube secured with: Tape Dental Injury: Teeth and Oropharynx as per pre-operative assessment

## 2015-08-28 NOTE — Progress Notes (Signed)
Dr. Deatra Canter informed of pt. 's lab values, preparing to do breast blocks, bilateral.

## 2015-08-28 NOTE — Transfer of Care (Signed)
Immediate Anesthesia Transfer of Care Note  Patient: Christina [name]  Procedure(s) Performed: Procedure(s): BILATERAL BREAST LUMPECTOMY WITH BILATERAL  NEEDLE LOCALIZATION AND BILATERAL AXILLARY SENTINEL LYMPH NODE BX (Bilateral)  Patient Location: PACU  Anesthesia Type:General and GA combined with regional for post-op pain  Level of Consciousness: awake, alert , oriented and patient cooperative  Airway & Oxygen Therapy: Patient Spontanous Breathing and Patient connected to nasal cannula oxygen  Post-op Assessment: Report given to RN, Post -op Vital signs reviewed and stable and Patient moving all extremities  Post vital signs: Reviewed and stable  Last Vitals:  Filed Vitals:   08/28/15 1116  BP: 139/68  Pulse: 70  Temp: 36.8 C  Resp: 18    Complications: No apparent anesthesia complications

## 2015-08-28 NOTE — Progress Notes (Signed)
Call to [facility], spoke with [facility] & informed that pt. Has just arrived.

## 2015-08-29 ENCOUNTER — Encounter (HOSPITAL_COMMUNITY): Payer: Self-pay | Admitting: Surgery

## 2015-08-30 ENCOUNTER — Emergency Department (HOSPITAL_COMMUNITY)
Admission: EM | Admit: 2015-08-30 | Discharge: 2015-08-30 | Disposition: A | Discharge status: Home / Self Care | Payer: Medicare Other | Attending: Emergency Medicine | Admitting: Emergency Medicine

## 2015-08-30 DIAGNOSIS — Z853 Personal history of malignant neoplasm of breast: Secondary | ICD-10-CM | POA: Insufficient documentation

## 2015-08-30 DIAGNOSIS — Z862 Personal history of diseases of the blood and blood-forming organs and certain disorders involving the immune mechanism: Secondary | ICD-10-CM | POA: Insufficient documentation

## 2015-08-30 DIAGNOSIS — L7633 Postprocedural seroma of skin and subcutaneous tissue following a dermatologic procedure: Secondary | ICD-10-CM | POA: Diagnosis not present

## 2015-08-30 DIAGNOSIS — T792XXA Traumatic secondary and recurrent hemorrhage and seroma, initial encounter: Secondary | ICD-10-CM

## 2015-08-30 DIAGNOSIS — IMO0001 Reserved for inherently not codable concepts without codable children: Secondary | ICD-10-CM

## 2015-08-30 DIAGNOSIS — I10 Essential (primary) hypertension: Secondary | ICD-10-CM | POA: Insufficient documentation

## 2015-08-30 DIAGNOSIS — T888XXA Other specified complications of surgical and medical care, not elsewhere classified, initial encounter: Secondary | ICD-10-CM

## 2015-08-30 MED ORDER — DOXYCYCLINE HYCLATE 100 MG PO CAPS: 100.0000 mg | ORAL_CAPSULE | Freq: Two times a day (BID) | ORAL | Status: DC

## 2015-08-30 NOTE — ED Provider Notes (Signed)
MSE was initiated and I personally evaluated the patient and placed orders (if any) at  4:00 PM on August 30, 2015.  The patient appears stable so that the remainder of the MSE may be completed by another provider.  Patient was seen primarily by Dr Brantley Stage, who operated on the patient 2 days ago  Davonna Belling, MD 08/30/15 1601

## 2015-08-30 NOTE — ED Notes (Signed)
Patient had Bilateral breast surgery 2 days ago.  She comes to the ED with C/O drainage from her right breast incision.  On assessment, site is clean, dry and intact. No drainage is noted.

## 2015-08-30 NOTE — Progress Notes (Signed)
Patient presents to emergency room due to drainage from her right breast lumpectomy site. 2 days ago she underwent bilateral lumpectomy with bilateral axillary sentinel nodes for stage I breast cancer. She had some clear drainage from the right breast incision came the emergency room. I was seeing the patient and Dr. Alvino Chapel mention she was here and I told him I would see her. She is doing fine otherwise. She had some clear drainage from the right breast lumpectomy site. Denies fever chills. Denies any significant pain. Upon examination both lumpectomy sites appear clean dry and intact. There is no leakage of fluid at this point in time. The axillary incisions are intact as well. There is mild swelling and minimal erythema of both axilla. Given the mild erythema in the axilla, will place her on doxycycline 100 mg by mouth twice a day. I told in place pads over the incision and the drainage may not be unexpected in this case. I told him assurance of feverish chills to let us know. She is nontoxic and overall looks quite well. They will call Monday to make an appointment to follow-up in 10 days or so as long she's doing okay. I told her to return to work in 2 weeks. I told her showers well but will avoid the use of deodorant for now. I told her no heavy lifting pushing or pulling.

## 2015-08-30 NOTE — Discharge Instructions (Signed)
Seroma  A seroma is a collection of fluid that looks like swelling or a mass on the body. Seromas form on the body where tissue has been injured or cut. They are most common after surgeries. Seromas vary in size. Some are small and painless. Others may become large and cause pain or discomfort. Many seromas go away on their own; the fluid is naturally absorbed by the body. Some may require the fluid to be drained through medical procedures.   CAUSES   Seromas form as the result of damage to tissue or the removal of tissue. This tissue damage may occur during surgery or because of an injury or trauma. When tissue is disrupted or removed, empty space is created. The body's natural defense system causes fluid to enter the empty space and form a seroma.  SYMPTOMS   · Swelling at the site of a surgical cut (incision) or an injury.  · Drainage of clear fluid at the surgery or injury site.  · Possible discomfort or pain.  DIAGNOSIS   Your health care provider will perform a physical exam. During the exam, the health care provider will press on the seroma using a hand or fingers (palpation). Various tests may be ordered to help confirm the diagnosis. These tests may include:  · Blood tests.  · Imaging tests such as ultrasonography or computed tomography (CT).  TREATMENT   Sometimes seromas resolve on their own and drain naturally in the body. Your health care provider may monitor you to make sure the seroma does not cause any complications.  If your seroma does not resolve on its own, treatment may include:  · Using a needle to drain the fluid from the seroma (needle aspiration).  · Inserting a flexible tube (catheter) to drain the fluid.  · Applying a dressing, such as an elastic bandage or binder.  · Use of antibiotic medicines if the seroma becomes infected.  · In rare cases, surgery may be done to remove the seroma and repair the area.  HOME CARE INSTRUCTIONS  · Follow your health care provider's instructions regarding  activity levels and any limitations on movements.  · Only take over-the-counter or prescription medicines as directed by your health care provider.  · If your health care provider prescribes antibiotics, take them as directed. Finish them even if you start to feel better.  · Check your seroma every day for redness, warmth, or yellow drainage.  · Follow up with your health care provider as directed.  SEEK MEDICAL CARE IF:  · You develop a fever.  · You have pain, tenderness, redness, or warmth at the site of the seroma.  · You notice yellow drainage coming from the site of the seroma.  · Your seroma is getting bigger.     This information is not intended to replace advice given to you by your health care provider. Make sure you discuss any questions you have with your health care provider.     Document Released: 12/04/2012 Document Revised: 08/30/2014 Document Reviewed: 12/04/2012  Elsevier Interactive Patient Education ©2016 [employer]

## 2015-08-30 NOTE — ED Notes (Signed)
Dr Cornett at bedside. 

## 2015-08-30 NOTE — ED Notes (Signed)
Patient able to ambulate to wheelchair

## 2015-09-11 ENCOUNTER — Encounter: Payer: Self-pay | Admitting: *Deleted

## 2015-09-11 NOTE — Progress Notes (Signed)
Ordered oncotype on larger tumor on the right breast per Dr. Jana Hakim.  Faxed requisition to pathology and confirmed receipt.

## 2015-09-24 ENCOUNTER — Encounter (HOSPITAL_COMMUNITY): Payer: Self-pay

## 2015-09-24 ENCOUNTER — Telehealth: Payer: Self-pay | Admitting: *Deleted

## 2015-09-24 NOTE — Telephone Encounter (Signed)
Received oncotype results of 5.  Left message for a return phone call to relay results to patient.

## 2015-09-26 ENCOUNTER — Telehealth: Payer: Self-pay | Admitting: *Deleted

## 2015-09-26 NOTE — Telephone Encounter (Signed)
Spoke with patient to discuss oncotype results of 5.  She will not need chemo and is ready for radiation therapy.  Referral placed for Dr. Isidore Moos.  I will cancel her appointment with Dr. Jana Hakim and reschedule after her radiation is complete. Patient verbalized understanding.

## 2015-09-30 ENCOUNTER — Ambulatory Visit: Payer: Medicare Other | Admitting: Oncology

## 2015-10-03 ENCOUNTER — Ambulatory Visit: Payer: Medicare Other | Admitting: Radiation Oncology

## 2015-10-13 NOTE — Progress Notes (Signed)
Location of Breast Cancer: Right Breast  Histology per Pathology Report:  08/28/15 Diagnosis 1. Breast, lumpectomy, right - INVASIVE DUCTAL CARCINOMA WITH CALCIFICATIONS, GRADE I/III, SPANNING 3.0 CM. - DUCTAL CARCINOMA IN SITU, LOW GRADE. - LOBULAR NEOPLASIA (ATYPICAL LOBULAR HYPERPLASIA). - INVASIVE CARCINOMA IS BROADLY LESS THAN 0.1 CM TO THE POSTERIOR MARGIN. - DUCTAL CARCINOMA IN SITU IS FOCALLY LESS THAN 0.1 CM TO THE ANTERIOR MARGIN. - SEE ONCOLOGY TABLE BELOW. 2. Breast, lumpectomy, left - INVASIVE DUCTAL CARCINOMA WITH CALCIFICATIONS, GRADE I/III, SPANNING 1.3 CM. - DUCTAL CARCINOMA IN SITU WITH CALCIFICATIONS, LOW GRADE. - LOBULAR NEOPLASIA (ATYPICAL LOBULAR HYPERPLASIA). - INVASIVE CARCINOMA IS FOCALLY 0.1 CM TO THE INFERIOR MARGIN. - DUCTAL CARCINOMA IS BROADLY 0.1 CM TO THE INFERIOR MARGIN.   Receptor Status: ER (100%), PR (95%), Her2-neu (NEG)  Did patient present with symptoms or was this found on screening mammography?: The right breast tumor was found on a screening mammography. The Left Breast tumor was found on MRI of breast  Past/Anticipated interventions by surgeon, if any: 08/28/15 Bilateral needle localized lumpectomy with bilateral axillary sentinel lymph node mapping by Dr. Brantley Stage   Past/Anticipated interventions by medical oncology, if any:   She saw Dr. Jana Hakim on 07/01/16 who recommended Oncotype testing and radiation therapy. On 09/25/25 she received a phone call stating she did not need chemotherapy and Dr. Jana Hakim would see her again when her radiation is complete.   Lymphedema issues, if any:  None     Pain issues, if any:  None  She reports no complications from surgery.   SAFETY ISSUES:  Prior radiation? No  Pacemaker/ICD? No  Possible current pregnancy?No  Is the patient on methotrexate? No  Current Complaints / other details:   FAMILY HISTORY History reviewed. No pertinent family history. The patient's parents died when she was  very young. She thinks her father may have died from "high blood pressure", and her mother from cancer, possibly of the uterus. He is alive 3 brothers, 2 sisters. One brother died with lung cancer. He had a history of tobacco abuse. One other brother and one sister have died from complications of diabetes  GYNECOLOGIC HISTORY:  No LMP recorded. Patient has had a hysterectomy. Menarche age 57, first live birth age 89, the patient is [place] P2. She does not recall exactly when she stopped having periods. She never took hormone replacement. She never used oral contraceptives.  BP 139/81 mmHg  Pulse 66  Temp(Src) 98 F (36.7 C)  Ht 5' 6"  (1.676 m)  Wt 171 lb 11.2 oz (77.883 kg)  BMI 27.73 kg/m2     Christina [name], Stephani Police, RN 10/13/2015,1:53 PM

## 2015-10-20 ENCOUNTER — Encounter: Payer: Self-pay | Admitting: *Deleted

## 2015-10-20 ENCOUNTER — Ambulatory Visit
Admission: RE | Admit: 2015-10-20 | Discharge: 2015-10-20 | Disposition: A | Discharge status: Home / Self Care | Payer: Medicare Other | Source: Ambulatory Visit | Attending: Radiation Oncology | Admitting: Radiation Oncology

## 2015-10-20 ENCOUNTER — Encounter: Payer: Self-pay | Admitting: Radiation Oncology

## 2015-10-20 ENCOUNTER — Telehealth: Payer: Self-pay

## 2015-10-20 VITALS — BP 139/81 | HR 66 | Temp 98.0°F | Ht 66.0 in | Wt 171.7 lb

## 2015-10-20 DIAGNOSIS — C50411 Malignant neoplasm of upper-outer quadrant of right female breast: Secondary | ICD-10-CM

## 2015-10-20 DIAGNOSIS — C50312 Malignant neoplasm of lower-inner quadrant of left female breast: Secondary | ICD-10-CM | POA: Diagnosis present

## 2015-10-20 DIAGNOSIS — Z90710 Acquired absence of both cervix and uterus: Secondary | ICD-10-CM | POA: Diagnosis not present

## 2015-10-20 DIAGNOSIS — Z170 Estrogen receptor positive status [ER+]: Secondary | ICD-10-CM | POA: Diagnosis not present

## 2015-10-20 DIAGNOSIS — Z510 Encounter for antineoplastic radiation therapy: Secondary | ICD-10-CM | POA: Insufficient documentation

## 2015-10-20 NOTE — Addendum Note (Signed)
Encounter addended by: Ernst Spell, RN on: 10/20/2015  2:44 PM<BR>     Documentation filed: Charges VN

## 2015-10-20 NOTE — Progress Notes (Signed)
Radiation Oncology         [phone] ________________________________  Name: Christina [name] MRN: [id number]  Date: 10/20/2015  DOB: Aug 10, 1946  Follow-Up Visit Note  Outpatient  CC: Fabian Sharp, MD  Erroll Luna, MD  Diagnosis:      ICD-9-CM ICD-10-CM   1. Breast cancer of upper-outer quadrant of right female breast ([place]) 174.4 C50.411 Ambulatory referral to Social Work  2. Cancer of lower-inner quadrant of left female breast ([place]) 174.3 C50.312   Stage II [id number] Right Breast UOQ Invasive Ductal Carcinoma with DCIS, ER+ / PR+ / Her2neg, Grade 1  Stage I [id number] Left breast LIQ invasive ductal carcinoma with DCIS,ER+ / PR+ / Her2neg,  Grade 1   Narrative:  The patient returns today for follow-up.     Since consultation, she underwent bilateral lumpectomies on 08-28-15 and sentinel lymph node biopsies by Dr Brantley Stage.  2 sentinel nodes were negative on the right, 1 node was negative on the left. No positive nodes.  Margins were close to DCIS and to invasive disease in both breasts. The tumor was 3.0 cm in the right breast and 1.3 cm in the left breast.  See characteristics of histology above in the diagnosis.  She received oncotype results of 5 on the larger tumor. She will not need chemotherapy per medical oncology.           I spoke about her close margins with Dr. Brantley Stage. He reports that the posterior margin for the right tumor was at pectoralis, and the anterior margin was at skin.  He discussed, however, the option of rexcision for the close margins in the left breast with the patient. That being said, he doesn't think there is a high likelihood that this would yield residual cancer cells. Patient declined reexcision.  ALLERGIES:  is allergic to aspirin and onion.  Meds: Current Outpatient Prescriptions  Medication Sig Dispense Refill  . atenolol (TENORMIN) 25 MG tablet Take 25 mg by mouth daily.    . hydrochlorothiazide (HYDRODIURIL) 25 MG tablet Take 25 mg by mouth  daily.    [name] ibuprofen (ADVIL,MOTRIN) 800 MG tablet Take 1 tablet (800 mg total) by mouth 3 (three) times daily. (Patient taking differently: Take 800 mg by mouth every 8 (eight) hours as needed for mild pain. ) 21 tablet 0  . traMADol (ULTRAM) 50 MG tablet Take 50 mg by mouth every 12 (twelve) hours as needed for moderate pain.     [name] triamterene-hydrochlorothiazide (MAXZIDE-25) 37.5-25 MG tablet Take 1 tablet by mouth daily.  5  . lisinopril (PRINIVIL,ZESTRIL) 10 MG tablet Take 10 mg by mouth daily. Reported on 10/20/2015    . oxyCODONE-acetaminophen (ROXICET) 5-325 MG tablet Take 1-2 tablets by mouth every 4 (four) hours as needed. (Patient not taking: Reported on 10/20/2015) 30 tablet 0   No current facility-administered medications for this encounter.    Physical Findings:  height is 5\' 6"  (1.676 m) and weight is 171 lb 11.2 oz (77.883 kg). Her temperature is 98 F (36.7 C). Her blood pressure is 139/81 and her pulse is 66. [name]     General: Alert and oriented, in no acute distress  Extremities: No cyanosis or edema. Lymphatics: see Neck Exam  Breast exam reveals well healed lumpectomy and axillary scars bilaterally  Lab Findings: Lab Results  Component Value Date   WBC 5.3 08/26/2015   HGB 9.6* 08/26/2015   HCT 30.2* 08/26/2015   MCV 83.0 08/26/2015   PLT 248 08/26/2015  Radiographic Findings: No results found.  Impression/Plan: Bilateral breast cancers. Close margins. After my discussion with Dr. Brantley Stage, I told the patient that her margins are close and reexcision is an option for the left breast.  The right breast margins are at skin at muscle.  She understands her margins are close, and that reexcision might improve local control but not her life expectancy. She declines reexcision. I will boost both lumpectomy sites with additional dose.  We discussed adjuvant radiotherapy today.  I recommend radiotherapy to the bilateral breasts in order to reduce risk of local  recurrences by 2/3.  The risks, benefits and side effects of this treatment were discussed in detail.  She understands that radiotherapy is associated with skin irritation and fatigue in the acute setting. Late effects can include cosmetic changes and rare injury to internal organs.   She is enthusiastic about proceeding with treatment. A consent form has been  signed and placed in her chart. Anticipate 6 weeks of treatment.  I'll ask our therapists to call with a simulation appointment in the near future.  Our staff is calling social work to address additional concerns, including transportation.  I spent 30 minutes face to face with the patient and more than 50% of that time was spent in counseling and/or coordination of care. _____________________________________   Eppie Gibson, MD

## 2015-10-20 NOTE — Progress Notes (Signed)
[facility] Psychosocial Distress Screening Clinical Social Work  Clinical Social Work was referred by distress screening protocol.  The patient scored a 10 on the Psychosocial Distress Thermometer which indicates severe distress. Clinical Social Worker met with pt and her daughter to assess for distress and other psychosocial needs. Per pt and daughter, they live together at apt in [place] through [employer]. Daughter is main name on lease and they have many concerns with the community and cost of rent. They report to have requested assistance through [facility]. CSW explained that CSW would suggest them to follow up with [facility], like they had and now they have to wait. CSW does not have a way to reduce this wait time or impact on this unfortunately. CSW did review resources to assist with financial concerns due to breast cancer treatment. CSW provided them with applications and contacts for [facility] in [place], [place] [employer] and [employer]. They plan to follow up with fin counselor as well as they should qualify for the grant.   Pt reports food stamps to be reduced and that is their biggest concern. Pt states she is on daughter's food stamps as it is for the whole house. Pt is receiving her social security currently and was provided with additional resources. CSW doubts food stamp amount would change as pt's income is steady. The other resources provided could possibly provide some relief re food assistance.  CSW also discussed fitness options such as yoga, hikes through [place]. CSW provided this information as well along with CSW contact information for additional assistance. Pt is coping well with her illness, her main concerns are financial in nature and referral to fin counseling is in the works. Pt and daughter agree to begin application process for these resources and get back with CSW as needed.   ONCBCN DISTRESS SCREENING 10/20/2015  Screening Type  Initial Screening  Distress experienced in past week (1-10) 10  Practical problem type Housing;Insurance;Food  Information Concerns Type Lack of info about maintaining fitness  Physician notified of physical symptoms Yes  Referral to support programs     Clinical Social Worker follow up needed: Yes.    If yes, follow up plan: See above Loren Racer, [employer] Worker [place]  [facility] Phone: [phone] Fax: [phone]

## 2015-10-20 NOTE — Telephone Encounter (Signed)
I called and left a voice mail for Christina [name] regarding her appointment today. She has not arrived at this time, I advised her to call me to reschedule if necessary.

## 2015-10-27 ENCOUNTER — Ambulatory Visit
Admission: RE | Admit: 2015-10-27 | Discharge: 2015-10-27 | Disposition: A | Discharge status: Home / Self Care | Payer: Medicare Other | Source: Ambulatory Visit | Attending: Radiation Oncology | Admitting: Radiation Oncology

## 2015-10-27 ENCOUNTER — Other Ambulatory Visit: Payer: Self-pay | Admitting: *Deleted

## 2015-10-27 ENCOUNTER — Encounter: Payer: Self-pay | Admitting: Radiation Oncology

## 2015-10-27 VITALS — Ht 66.0 in | Wt 171.2 lb

## 2015-10-27 VITALS — BP 123/67 | HR 71 | Temp 98.9°F | Resp 18 | Ht 66.0 in

## 2015-10-27 DIAGNOSIS — C50411 Malignant neoplasm of upper-outer quadrant of right female breast: Secondary | ICD-10-CM

## 2015-10-27 DIAGNOSIS — Z510 Encounter for antineoplastic radiation therapy: Secondary | ICD-10-CM | POA: Diagnosis not present

## 2015-10-27 DIAGNOSIS — C50312 Malignant neoplasm of lower-inner quadrant of left female breast: Secondary | ICD-10-CM

## 2015-10-27 NOTE — Progress Notes (Signed)
I met with the patient today as I have noticed she has a  light skin color and hair color, as well as deviation of her left eye, and given her African American heritage, I wanted to know if she had albinism, as this might impact the side effects of radiotherapy.  She reports that she does not have albinism. She denies sunburns as a child and has not been diagnosed with cataracts.  I will monitor her closely for skin irritation during radiotherapy, nonetheless. -----------------------------------  Eppie Gibson, MD

## 2015-10-27 NOTE — Progress Notes (Signed)
  Radiation Oncology         [phone] ________________________________  Name: Christina [name] MRN: [id number]  Date: 10/27/2015  DOB: 1945-10-24  SIMULATION AND TREATMENT PLANNING NOTE    Outpatient  DIAGNOSIS:     ICD-9-CM ICD-10-CM   1. Breast cancer of upper-outer quadrant of right female breast ([place]) 174.4 C50.411   2. Cancer of lower-inner quadrant of left female breast ([place]) 174.3 C50.312     NARRATIVE:  The patient was brought to the [facility].  Identity was confirmed.  All relevant records and images related to the planned course of therapy were reviewed.  The patient freely provided informed written consent to proceed with treatment after reviewing the details related to the planned course of therapy. The consent form was witnessed and verified by the simulation staff.    Then, the patient was set-up in a stable reproducible supine position for radiation therapy with her ipsilateral arm over her head, and her upper body secured in a custom-made Vac-lok device.  CT images were obtained.  Surface markings were placed.  The CT images were loaded into the planning software.    TREATMENT PLANNING NOTE: Treatment planning then occurred.  The radiation prescription was entered and confirmed.     A total of 5 medically necessary complex treatment devices were fabricated and supervised by me: 4 fields with MLCs for custom blocks to protect heart, and lungs;  and, a Vac-lok. MORE COMPLEX DEVICES MAY BE MADE IN DOSIMETRY FOR FIELD IN FIELD BEAMS FOR DOSE HOMOGENEITY.  I have requested : 3D Simulation  I have requested a DVH of the following structures: lungs, heart, lumpectomy cavity, bowel.    The patient will receive 46 Gy in 23 fractions to the bilateral breasts with 4 tangential fields.  . This will  be followed by boosts.  Optical Surface Tracking Plan:  Since intensity modulated radiotherapy (IMRT) and 3D conformal radiation treatment methods are predicated on  accurate and precise positioning for treatment, intrafraction motion monitoring is medically necessary to ensure accurate and safe treatment delivery. The ability to quantify intrafraction motion without excessive ionizing radiation dose can only be performed with optical surface tracking. Accordingly, surface imaging offers the opportunity to obtain 3D measurements of patient position throughout IMRT and 3D treatments without excessive radiation exposure. I am ordering optical surface tracking for this patient's upcoming course of radiotherapy.  ________________________________   Reference:  Ursula Alert, J, et al. Surface imaging-based analysis of intrafraction motion for breast radiotherapy patients.Journal of [employer], n. 6, nov. 2014. ISSN [id number].  Available at: <http://www.jacmp.org/index.php/jacmp/article/view/4957>.    -----------------------------------  Eppie Gibson, MD

## 2015-10-27 NOTE — Progress Notes (Signed)
Here to see Dr. Lanell Persons after Sim today. Bilateral breast cancer. BP 123/67 mmHg  Pulse 71  Temp(Src) 98.9 F (37.2 C) (Oral)  Resp 18  Ht 5\' 6"  (1.676 m)  SpO2 100%

## 2015-10-28 ENCOUNTER — Telehealth: Payer: Self-pay | Admitting: Oncology

## 2015-10-28 NOTE — Telephone Encounter (Signed)
Spoke with patient and daughter to confirm appt date 5/2 per 3/6 pof

## 2015-10-31 ENCOUNTER — Encounter: Payer: Self-pay | Admitting: *Deleted

## 2015-10-31 DIAGNOSIS — Z510 Encounter for antineoplastic radiation therapy: Secondary | ICD-10-CM | POA: Diagnosis not present

## 2015-10-31 NOTE — Progress Notes (Signed)
[place] Work  Clinical Social Work completed referral to [place] as requested by patient. CSW mailed application and submitted required paperwork as well.   Loren Racer, [place] Worker [place]  [place] Phone: [phone] Fax: [phone]

## 2015-11-04 ENCOUNTER — Ambulatory Visit
Admission: RE | Admit: 2015-11-04 | Discharge: 2015-11-04 | Disposition: A | Discharge status: Home / Self Care | Payer: Medicare Other | Source: Ambulatory Visit | Attending: Radiation Oncology | Admitting: Radiation Oncology

## 2015-11-04 DIAGNOSIS — Z510 Encounter for antineoplastic radiation therapy: Secondary | ICD-10-CM | POA: Diagnosis not present

## 2015-11-05 ENCOUNTER — Ambulatory Visit: Payer: Medicare Other

## 2015-11-05 ENCOUNTER — Ambulatory Visit
Admission: RE | Admit: 2015-11-05 | Discharge: 2015-11-05 | Disposition: A | Discharge status: Home / Self Care | Payer: Medicare Other | Source: Ambulatory Visit | Attending: Radiation Oncology | Admitting: Radiation Oncology

## 2015-11-05 DIAGNOSIS — C50312 Malignant neoplasm of lower-inner quadrant of left female breast: Secondary | ICD-10-CM

## 2015-11-05 DIAGNOSIS — Z510 Encounter for antineoplastic radiation therapy: Secondary | ICD-10-CM | POA: Diagnosis not present

## 2015-11-05 MED ORDER — RADIAPLEXRX EX GEL
Freq: Once | CUTANEOUS | Status: AC
  Administered 2015-11-05: 16:00:00 via TOPICAL

## 2015-11-05 MED ORDER — ALRA NON-METALLIC DEODORANT (RAD-ONC)
1.0000 "application " | Freq: Once | TOPICAL | Status: AC
  Administered 2015-11-05: 1 via TOPICAL

## 2015-11-05 NOTE — Addendum Note (Signed)
Encounter addended by: Ernst Spell, RN on: 11/05/2015  3:50 PM<BR>     Documentation filed: Inpatient MAR

## 2015-11-05 NOTE — Progress Notes (Signed)
Pt here for patient teaching.  Pt given Radiation and You booklet, skin care instructions, [place]ra deodorant and Radiaplex gel. Pt reports they have not watched the Radiation Therapy Education video, but was given the link to watch at home.  Reviewed areas of pertinence such as fatigue, skin changes, breast tenderness, breast swelling, cough, shortness of breath, earaches and taste changes . Pt able to give teach back of to pat skin, use unscented/gentle soap and drink plenty of water,apply Radiaplex bid, avoid applying anything to skin within 4 hours of treatment, avoid wearing an under wire bra and to use an electric razor if they must shave. Pt verbalizes understanding of information given and will contact nursing with any questions or concerns.     Http://rtanswers.org/treatmentinformation/whattoexpect/index

## 2015-11-06 ENCOUNTER — Ambulatory Visit: Payer: Medicare Other

## 2015-11-06 ENCOUNTER — Ambulatory Visit
Admission: RE | Admit: 2015-11-06 | Discharge: 2015-11-06 | Disposition: A | Discharge status: Home / Self Care | Payer: Medicare Other | Source: Ambulatory Visit | Attending: Radiation Oncology | Admitting: Radiation Oncology

## 2015-11-06 DIAGNOSIS — Z510 Encounter for antineoplastic radiation therapy: Secondary | ICD-10-CM | POA: Diagnosis not present

## 2015-11-07 ENCOUNTER — Ambulatory Visit: Payer: Medicare Other

## 2015-11-07 ENCOUNTER — Ambulatory Visit
Admission: RE | Admit: 2015-11-07 | Discharge: 2015-11-07 | Disposition: A | Discharge status: Home / Self Care | Payer: Medicare Other | Source: Ambulatory Visit | Attending: Radiation Oncology | Admitting: Radiation Oncology

## 2015-11-07 DIAGNOSIS — Z510 Encounter for antineoplastic radiation therapy: Secondary | ICD-10-CM | POA: Diagnosis not present

## 2015-11-10 ENCOUNTER — Encounter: Payer: Self-pay | Admitting: Radiation Oncology

## 2015-11-10 ENCOUNTER — Ambulatory Visit
Admission: RE | Admit: 2015-11-10 | Discharge: 2015-11-10 | Disposition: A | Discharge status: Home / Self Care | Payer: Medicare Other | Source: Ambulatory Visit | Attending: Radiation Oncology | Admitting: Radiation Oncology

## 2015-11-10 ENCOUNTER — Ambulatory Visit: Payer: Medicare Other

## 2015-11-10 VITALS — BP 116/74 | HR 60 | Temp 98.4°F | Ht 66.0 in | Wt 171.3 lb

## 2015-11-10 DIAGNOSIS — C50411 Malignant neoplasm of upper-outer quadrant of right female breast: Secondary | ICD-10-CM

## 2015-11-10 DIAGNOSIS — Z510 Encounter for antineoplastic radiation therapy: Secondary | ICD-10-CM | POA: Diagnosis not present

## 2015-11-10 DIAGNOSIS — C50312 Malignant neoplasm of lower-inner quadrant of left female breast: Secondary | ICD-10-CM

## 2015-11-10 NOTE — Progress Notes (Signed)
   Weekly Management Note:  Outpatient    ICD-9-CM ICD-10-CM   1. Breast cancer of upper-outer quadrant of right female breast ([place]) 174.4 C50.411   2. Cancer of lower-inner quadrant of left female breast ([facility]) 174.3 C50.312     Current Dose:  8 Gy  Projected Dose: 60 Gy   Narrative:  The patient presents for routine under treatment assessment.  CBCT/MVCT images/Port film x-rays were reviewed.  The chart was checked. No new complaints  Physical Findings:  height is 5\' 6"  (1.676 m) and weight is 171 lb 4.8 oz (77.701 kg). Her temperature is 98.4 F (36.9 C). Her blood pressure is 116/74 and her pulse is 60.   Wt Readings from Last 3 Encounters:  11/10/15 171 lb 4.8 oz (77.701 kg)  10/27/15 171 lb 3.2 oz (77.656 kg)  10/20/15 171 lb 11.2 oz (77.883 kg)   Skin without changes over chest  Impression:  The patient is tolerating radiotherapy.  Plan:  Continue radiotherapy as planned.    ________________________________   Eppie Gibson, M.D.

## 2015-11-10 NOTE — Progress Notes (Signed)
Christina [name] has received 4 fractions to her bilateral breast.  Skin intact and soft without any changes at this time.

## 2015-11-11 ENCOUNTER — Ambulatory Visit: Payer: Medicare Other

## 2015-11-11 ENCOUNTER — Ambulatory Visit
Admission: RE | Admit: 2015-11-11 | Discharge: 2015-11-11 | Disposition: A | Discharge status: Home / Self Care | Payer: Medicare Other | Source: Ambulatory Visit | Attending: Radiation Oncology | Admitting: Radiation Oncology

## 2015-11-11 DIAGNOSIS — Z510 Encounter for antineoplastic radiation therapy: Secondary | ICD-10-CM | POA: Diagnosis not present

## 2015-11-11 NOTE — Progress Notes (Signed)
Reconciled medication today.  Advised to inform us of any dosage changes with current medications or if new medications prescribed by her PCP.  She stated understanding.

## 2015-11-12 ENCOUNTER — Ambulatory Visit: Payer: Medicare Other

## 2015-11-12 ENCOUNTER — Ambulatory Visit
Admission: RE | Admit: 2015-11-12 | Discharge: 2015-11-12 | Disposition: A | Discharge status: Home / Self Care | Payer: Medicare Other | Source: Ambulatory Visit | Attending: Radiation Oncology | Admitting: Radiation Oncology

## 2015-11-12 DIAGNOSIS — Z510 Encounter for antineoplastic radiation therapy: Secondary | ICD-10-CM | POA: Diagnosis not present

## 2015-11-13 ENCOUNTER — Telehealth: Payer: Self-pay | Admitting: *Deleted

## 2015-11-13 ENCOUNTER — Ambulatory Visit
Admission: RE | Admit: 2015-11-13 | Discharge: 2015-11-13 | Disposition: A | Discharge status: Home / Self Care | Payer: Medicare Other | Source: Ambulatory Visit | Attending: Radiation Oncology | Admitting: Radiation Oncology

## 2015-11-13 ENCOUNTER — Encounter: Payer: Self-pay | Admitting: *Deleted

## 2015-11-13 ENCOUNTER — Ambulatory Visit: Payer: Medicare Other

## 2015-11-13 DIAGNOSIS — Z510 Encounter for antineoplastic radiation therapy: Secondary | ICD-10-CM | POA: Diagnosis not present

## 2015-11-13 NOTE — Progress Notes (Signed)
[place] Work  Clinical Social Work was referred by Development worker, community for assessment of psychosocial needs due to additional financial concerns.  Clinical Social Worker contacted patient to offer support and discuss financial resources. CSW left message for pt to bring additional materials needed for [employer] [facility] [place] application when she comes for treatment later today. CSW encouraged pt to reach out to CSW to work on application.    Clinical Social Work interventions: Resource assistance  Loren Racer, [place] Worker [place]  [place] Phone: [phone] Fax: [phone]

## 2015-11-13 NOTE — Telephone Encounter (Signed)
Left message for a return phone call to follow up after start of radiation.   

## 2015-11-14 ENCOUNTER — Ambulatory Visit
Admission: RE | Admit: 2015-11-14 | Discharge: 2015-11-14 | Disposition: A | Discharge status: Home / Self Care | Payer: Medicare Other | Source: Ambulatory Visit | Attending: Radiation Oncology | Admitting: Radiation Oncology

## 2015-11-14 ENCOUNTER — Ambulatory Visit: Payer: Medicare Other

## 2015-11-14 DIAGNOSIS — Z510 Encounter for antineoplastic radiation therapy: Secondary | ICD-10-CM | POA: Diagnosis not present

## 2015-11-17 ENCOUNTER — Encounter: Payer: Self-pay | Admitting: Radiation Oncology

## 2015-11-17 ENCOUNTER — Ambulatory Visit
Admission: RE | Admit: 2015-11-17 | Discharge: 2015-11-17 | Disposition: A | Discharge status: Home / Self Care | Payer: Medicare Other | Source: Ambulatory Visit | Attending: Radiation Oncology | Admitting: Radiation Oncology

## 2015-11-17 ENCOUNTER — Ambulatory Visit: Payer: Medicare Other

## 2015-11-17 VITALS — BP 126/61 | HR 58 | Temp 98.2°F | Ht 66.0 in | Wt 171.7 lb

## 2015-11-17 DIAGNOSIS — Z510 Encounter for antineoplastic radiation therapy: Secondary | ICD-10-CM | POA: Diagnosis not present

## 2015-11-17 DIAGNOSIS — C50312 Malignant neoplasm of lower-inner quadrant of left female breast: Secondary | ICD-10-CM

## 2015-11-17 DIAGNOSIS — C50411 Malignant neoplasm of upper-outer quadrant of right female breast: Secondary | ICD-10-CM

## 2015-11-17 NOTE — Progress Notes (Signed)
Ms. Grauer presents for her 9th fraction of radiation to her Bilateral Breasts. She denies pain, except some soreness to her Right Arm. She denies any fatigue today. The skin to her bilateral breast is normal appearing. She has not been using the radiaplex cream, but will plan to start tonight.  BP 126/61 mmHg  Pulse 58  Temp(Src) 98.2 F (36.8 C)  Ht 5\' 6"  (1.676 m)  Wt 171 lb 11.2 oz (77.883 kg)  BMI 27.73 kg/m2   Wt Readings from Last 3 Encounters:  11/17/15 171 lb 11.2 oz (77.883 kg)  11/10/15 171 lb 4.8 oz (77.701 kg)  10/27/15 171 lb 3.2 oz (77.656 kg)

## 2015-11-17 NOTE — Progress Notes (Signed)
   Weekly Management Note:  Outpatient    ICD-9-CM ICD-10-CM   1. Cancer of lower-inner quadrant of left female breast ([place]) 174.3 C50.312   2. Breast cancer of upper-outer quadrant of right female breast ([facility]) 174.4 C50.411     Current Dose:  18 Gy  Projected Dose: 60 Gy   Narrative:  The patient presents for routine under treatment assessment.  CBCT/MVCT images/Port film x-rays were reviewed.  The chart was checked. Doing well  Physical Findings:  height is 5\' 6"  (1.676 m) and weight is 171 lb 11.2 oz (77.883 kg). Her temperature is 98.2 F (36.8 C). Her blood pressure is 126/61 and her pulse is 58.   Wt Readings from Last 3 Encounters:  11/17/15 171 lb 11.2 oz (77.883 kg)  11/10/15 171 lb 4.8 oz (77.701 kg)  10/27/15 171 lb 3.2 oz (77.656 kg)   Skin without changes over chest  Impression:  The patient is tolerating radiotherapy.  Plan:  Continue radiotherapy as planned.    ________________________________   Eppie Gibson, M.D.

## 2015-11-18 ENCOUNTER — Ambulatory Visit
Admission: RE | Admit: 2015-11-18 | Discharge: 2015-11-18 | Disposition: A | Discharge status: Home / Self Care | Payer: Medicare Other | Source: Ambulatory Visit | Attending: Radiation Oncology | Admitting: Radiation Oncology

## 2015-11-18 ENCOUNTER — Ambulatory Visit: Payer: Medicare Other

## 2015-11-18 DIAGNOSIS — Z510 Encounter for antineoplastic radiation therapy: Secondary | ICD-10-CM | POA: Diagnosis not present

## 2015-11-19 ENCOUNTER — Ambulatory Visit: Payer: Medicare Other

## 2015-11-19 ENCOUNTER — Ambulatory Visit
Admission: RE | Admit: 2015-11-19 | Discharge: 2015-11-19 | Disposition: A | Discharge status: Home / Self Care | Payer: Medicare Other | Source: Ambulatory Visit | Attending: Radiation Oncology | Admitting: Radiation Oncology

## 2015-11-19 DIAGNOSIS — Z510 Encounter for antineoplastic radiation therapy: Secondary | ICD-10-CM | POA: Diagnosis not present

## 2015-11-20 ENCOUNTER — Ambulatory Visit: Payer: Medicare Other

## 2015-11-20 ENCOUNTER — Ambulatory Visit
Admission: RE | Admit: 2015-11-20 | Discharge: 2015-11-20 | Disposition: A | Discharge status: Home / Self Care | Payer: Medicare Other | Source: Ambulatory Visit | Attending: Radiation Oncology | Admitting: Radiation Oncology

## 2015-11-20 DIAGNOSIS — Z510 Encounter for antineoplastic radiation therapy: Secondary | ICD-10-CM | POA: Diagnosis not present

## 2015-11-21 ENCOUNTER — Ambulatory Visit: Payer: Medicare Other

## 2015-11-21 ENCOUNTER — Ambulatory Visit
Admission: RE | Admit: 2015-11-21 | Discharge: 2015-11-21 | Disposition: A | Discharge status: Home / Self Care | Payer: Medicare Other | Source: Ambulatory Visit | Attending: Radiation Oncology | Admitting: Radiation Oncology

## 2015-11-21 DIAGNOSIS — Z510 Encounter for antineoplastic radiation therapy: Secondary | ICD-10-CM | POA: Diagnosis not present

## 2015-11-24 ENCOUNTER — Ambulatory Visit: Payer: Medicare Other

## 2015-11-24 ENCOUNTER — Encounter: Payer: Self-pay | Admitting: Radiation Oncology

## 2015-11-24 ENCOUNTER — Encounter: Payer: Self-pay | Admitting: *Deleted

## 2015-11-24 ENCOUNTER — Ambulatory Visit
Admission: RE | Admit: 2015-11-24 | Discharge: 2015-11-24 | Disposition: A | Discharge status: Home / Self Care | Payer: Medicare Other | Source: Ambulatory Visit | Attending: Radiation Oncology | Admitting: Radiation Oncology

## 2015-11-24 VITALS — BP 128/69 | HR 56 | Temp 98.1°F | Ht 66.0 in | Wt 174.3 lb

## 2015-11-24 DIAGNOSIS — C50411 Malignant neoplasm of upper-outer quadrant of right female breast: Secondary | ICD-10-CM

## 2015-11-24 DIAGNOSIS — C50312 Malignant neoplasm of lower-inner quadrant of left female breast: Secondary | ICD-10-CM

## 2015-11-24 DIAGNOSIS — Z510 Encounter for antineoplastic radiation therapy: Secondary | ICD-10-CM | POA: Diagnosis not present

## 2015-11-24 NOTE — Progress Notes (Signed)
    Weekly Management Note:  Outpatient    ICD-9-CM ICD-10-CM   1. Breast cancer of upper-outer quadrant of right female breast ([place]) 174.4 C50.411   2. Cancer of lower-inner quadrant of left female breast ([facility]) 174.3 C50.312     Current Dose:  28  Gy  Projected Dose: 60 Gy   Narrative:  The patient presents for routine under treatment assessment.  CBCT/MVCT images/Port film x-rays were reviewed.  The chart was checked. Doing well  Physical Findings:  height is 5\' 6"  (1.676 m) and weight is 174 lb 4.8 oz (79.062 kg). Her temperature is 98.1 F (36.7 C). Her blood pressure is 128/69 and her pulse is 56.   Wt Readings from Last 3 Encounters:  11/24/15 174 lb 4.8 oz (79.062 kg)  11/17/15 171 lb 11.2 oz (77.883 kg)  11/10/15 171 lb 4.8 oz (77.701 kg)   Skin without obvious changes over chest  Impression:  The patient is tolerating radiotherapy.  Plan:  Continue radiotherapy as planned.   Massage voucher signed for services in [facility].   ________________________________   Eppie Gibson, M.D.

## 2015-11-24 NOTE — Progress Notes (Signed)
[place] Work  Clinical Social Work was referred by patient's daughter for assistance with [employer] in [place] application.  Clinical Social Worker had arranged appt today at 1215 with pt and daughter to work on [place]. Pt and daughter failed to appear for appt. CSW phoned pt and left message about trying to reschedule at later date. CSW awaits return call.   Loren Racer, [place] Worker [place]  [place] Phone: [phone] Fax: [phone]

## 2015-11-24 NOTE — Progress Notes (Signed)
Ms. Aegerter is here for her 14th fraction of radiation to her Bilateral Breasts. She reports a good energy level. She denies pain to her breast, but does report some tenderness to her lateral Right breast. The skin is normal appearing to her breast and she is using the radiaplex cream twice daily.   BP 128/69 mmHg  Pulse 56  Temp(Src) 98.1 F (36.7 C)  Ht 5\' 6"  (1.676 m)  Wt 174 lb 4.8 oz (79.062 kg)  BMI 28.15 kg/m2

## 2015-11-25 ENCOUNTER — Ambulatory Visit: Payer: Medicare Other

## 2015-11-25 ENCOUNTER — Ambulatory Visit
Admission: RE | Admit: 2015-11-25 | Discharge: 2015-11-25 | Disposition: A | Discharge status: Home / Self Care | Payer: Medicare Other | Source: Ambulatory Visit | Attending: Radiation Oncology | Admitting: Radiation Oncology

## 2015-11-25 DIAGNOSIS — Z510 Encounter for antineoplastic radiation therapy: Secondary | ICD-10-CM | POA: Diagnosis not present

## 2015-11-26 ENCOUNTER — Ambulatory Visit
Admission: RE | Admit: 2015-11-26 | Discharge: 2015-11-26 | Disposition: A | Discharge status: Home / Self Care | Payer: Medicare Other | Source: Ambulatory Visit | Attending: Radiation Oncology | Admitting: Radiation Oncology

## 2015-11-26 ENCOUNTER — Ambulatory Visit: Payer: Medicare Other

## 2015-11-26 DIAGNOSIS — Z510 Encounter for antineoplastic radiation therapy: Secondary | ICD-10-CM | POA: Diagnosis not present

## 2015-11-27 ENCOUNTER — Ambulatory Visit: Payer: Medicare Other

## 2015-11-27 ENCOUNTER — Ambulatory Visit
Admission: RE | Admit: 2015-11-27 | Discharge: 2015-11-27 | Disposition: A | Discharge status: Home / Self Care | Payer: Medicare Other | Source: Ambulatory Visit | Attending: Radiation Oncology | Admitting: Radiation Oncology

## 2015-11-27 DIAGNOSIS — Z510 Encounter for antineoplastic radiation therapy: Secondary | ICD-10-CM | POA: Diagnosis not present

## 2015-11-28 ENCOUNTER — Ambulatory Visit: Payer: Medicare Other

## 2015-11-28 ENCOUNTER — Ambulatory Visit
Admission: RE | Admit: 2015-11-28 | Discharge: 2015-11-28 | Disposition: A | Discharge status: Home / Self Care | Payer: Medicare Other | Source: Ambulatory Visit | Attending: Radiation Oncology | Admitting: Radiation Oncology

## 2015-11-28 DIAGNOSIS — Z510 Encounter for antineoplastic radiation therapy: Secondary | ICD-10-CM | POA: Diagnosis not present

## 2015-12-01 ENCOUNTER — Ambulatory Visit
Admission: RE | Admit: 2015-12-01 | Discharge: 2015-12-01 | Disposition: A | Discharge status: Home / Self Care | Payer: Medicare Other | Source: Ambulatory Visit | Attending: Radiation Oncology | Admitting: Radiation Oncology

## 2015-12-01 ENCOUNTER — Encounter: Payer: Self-pay | Admitting: Radiation Oncology

## 2015-12-01 ENCOUNTER — Ambulatory Visit: Payer: Medicare Other

## 2015-12-01 ENCOUNTER — Encounter: Payer: Self-pay | Admitting: *Deleted

## 2015-12-01 VITALS — BP 120/83 | HR 65 | Temp 97.7°F | Ht 66.0 in | Wt 174.1 lb

## 2015-12-01 DIAGNOSIS — Z510 Encounter for antineoplastic radiation therapy: Secondary | ICD-10-CM | POA: Diagnosis not present

## 2015-12-01 DIAGNOSIS — Z923 Personal history of irradiation: Secondary | ICD-10-CM | POA: Insufficient documentation

## 2015-12-01 DIAGNOSIS — C50411 Malignant neoplasm of upper-outer quadrant of right female breast: Secondary | ICD-10-CM | POA: Insufficient documentation

## 2015-12-01 DIAGNOSIS — C50312 Malignant neoplasm of lower-inner quadrant of left female breast: Secondary | ICD-10-CM | POA: Insufficient documentation

## 2015-12-01 MED ORDER — ALRA NON-METALLIC DEODORANT (RAD-ONC)
1.0000 "application " | Freq: Once | TOPICAL | Status: AC
  Administered 2015-12-01: 1 via TOPICAL

## 2015-12-01 MED ORDER — RADIAPLEXRX EX GEL
Freq: Once | CUTANEOUS | Status: AC
Start: 2015-12-01 — End: 2015-12-01
  Administered 2015-12-01: 16:00:00 via TOPICAL

## 2015-12-01 NOTE — Progress Notes (Signed)
Ms. Muscarello presents for her 19th fraction of radiation to her Bilateral Breasts. She reports occasional pain to her Right Axilla area at her incision site. She will occasionally take Tylenol for this pain, which is effective. The skin to her Bilateral Chest is normal appearing and she is using the radiaplex cream twice daily. She denies any fatigue.   BP 120/83 mmHg  Pulse 65  Temp(Src) 97.7 F (36.5 C)  Ht 5\' 6"  (1.676 m)  Wt 174 lb 1.6 oz (78.971 kg)  BMI 28.11 kg/m2   Wt Readings from Last 3 Encounters:  12/01/15 174 lb 1.6 oz (78.971 kg)  11/24/15 174 lb 4.8 oz (79.062 kg)  11/17/15 171 lb 11.2 oz (77.883 kg)

## 2015-12-01 NOTE — Progress Notes (Signed)
    Weekly Management Note:  Outpatient    ICD-9-CM ICD-10-CM   1. Cancer of lower-inner quadrant of left female breast ([facility]) 174.3 C50.312 hyaluronate sodium (RADIAPLEXRX) gel     non-metallic deodorant (ALRA) 1 application  2. Breast cancer of upper-outer quadrant of right female breast ([facility]) 174.4 C50.411     Current Dose:  38 Gy  Projected Dose: 60 Gy   Narrative:  The patient presents for routine under treatment assessment.  CBCT/MVCT images/Port film x-rays were reviewed.  The chart was checked. Doing well - no fatigue  Physical Findings:  height is 5\' 6"  (1.676 m) and weight is 174 lb 1.6 oz (78.971 kg). Her temperature is 97.7 F (36.5 C). Her blood pressure is 120/83 and her pulse is 65.   Wt Readings from Last 3 Encounters:  12/01/15 174 lb 1.6 oz (78.971 kg)  11/24/15 174 lb 4.8 oz (79.062 kg)  11/17/15 171 lb 11.2 oz (77.883 kg)   Skin still without obvious changes over chest  Impression:  The patient is tolerating radiotherapy.  Plan:  Continue radiotherapy as planned.     ________________________________   Eppie Gibson, M.D.

## 2015-12-01 NOTE — Addendum Note (Signed)
Encounter addended by: Ernst Spell, RN on: 12/01/2015  3:44 PM<BR>     Documentation filed: Inpatient MAR

## 2015-12-01 NOTE — Progress Notes (Signed)
[place] Work  Clinical Social Work left message for pt's daughter and pt to return CSW call regarding application for [employer] in [place] to assist with medical bills. CSW saw them briefly last Friday and they still wanted to apply for assistance, but needed to gather additional documents. CSW has left several messages for them to follow up and they have not returned calls or brought forward needed paperwork to date. CSW willing to assist, but cannot without paperwork. CSW to follow.    Clinical Social Work interventions: Resource asst Loren Racer, [place] Clinical Social Worker [place]  [place] Phone: [phone] Fax: [phone]

## 2015-12-02 ENCOUNTER — Ambulatory Visit: Payer: Medicare Other

## 2015-12-02 ENCOUNTER — Ambulatory Visit
Admission: RE | Admit: 2015-12-02 | Discharge: 2015-12-02 | Disposition: A | Discharge status: Home / Self Care | Payer: Medicare Other | Source: Ambulatory Visit | Attending: Radiation Oncology | Admitting: Radiation Oncology

## 2015-12-02 DIAGNOSIS — Z510 Encounter for antineoplastic radiation therapy: Secondary | ICD-10-CM | POA: Diagnosis not present

## 2015-12-03 ENCOUNTER — Ambulatory Visit: Payer: Medicare Other

## 2015-12-03 ENCOUNTER — Ambulatory Visit
Admission: RE | Admit: 2015-12-03 | Discharge: 2015-12-03 | Disposition: A | Discharge status: Home / Self Care | Payer: Medicare Other | Source: Ambulatory Visit | Attending: Radiation Oncology | Admitting: Radiation Oncology

## 2015-12-03 DIAGNOSIS — Z510 Encounter for antineoplastic radiation therapy: Secondary | ICD-10-CM | POA: Diagnosis not present

## 2015-12-04 ENCOUNTER — Ambulatory Visit: Payer: Medicare Other

## 2015-12-04 ENCOUNTER — Ambulatory Visit
Admission: RE | Admit: 2015-12-04 | Discharge: 2015-12-04 | Disposition: A | Discharge status: Home / Self Care | Payer: Medicare Other | Source: Ambulatory Visit | Attending: Radiation Oncology | Admitting: Radiation Oncology

## 2015-12-04 DIAGNOSIS — Z510 Encounter for antineoplastic radiation therapy: Secondary | ICD-10-CM | POA: Diagnosis not present

## 2015-12-05 ENCOUNTER — Ambulatory Visit: Payer: Medicare Other

## 2015-12-05 ENCOUNTER — Ambulatory Visit
Admission: RE | Admit: 2015-12-05 | Discharge: 2015-12-05 | Disposition: A | Discharge status: Home / Self Care | Payer: Medicare Other | Source: Ambulatory Visit | Attending: Radiation Oncology | Admitting: Radiation Oncology

## 2015-12-05 DIAGNOSIS — Z510 Encounter for antineoplastic radiation therapy: Secondary | ICD-10-CM | POA: Diagnosis not present

## 2015-12-08 ENCOUNTER — Encounter: Payer: Self-pay | Admitting: Radiation Oncology

## 2015-12-08 ENCOUNTER — Encounter: Payer: Self-pay | Admitting: *Deleted

## 2015-12-08 ENCOUNTER — Ambulatory Visit: Payer: Medicare Other

## 2015-12-08 ENCOUNTER — Ambulatory Visit
Admission: RE | Admit: 2015-12-08 | Discharge: 2015-12-08 | Disposition: A | Discharge status: Home / Self Care | Payer: Medicare Other | Source: Ambulatory Visit | Attending: Radiation Oncology | Admitting: Radiation Oncology

## 2015-12-08 VITALS — BP 128/66 | HR 61 | Temp 98.2°F | Ht 66.0 in | Wt 171.8 lb

## 2015-12-08 DIAGNOSIS — C50411 Malignant neoplasm of upper-outer quadrant of right female breast: Secondary | ICD-10-CM

## 2015-12-08 DIAGNOSIS — C50312 Malignant neoplasm of lower-inner quadrant of left female breast: Secondary | ICD-10-CM

## 2015-12-08 DIAGNOSIS — Z510 Encounter for antineoplastic radiation therapy: Secondary | ICD-10-CM | POA: Diagnosis not present

## 2015-12-08 NOTE — Progress Notes (Signed)
Christina [name] presents for her 24th fraction of radiation to her Bilateral Breasts. The skin over her Breasts is slightly red. Her lateral Right Breast is red, and she has tenderness to this area. She is using the radiaplex cream as directed. She has occasionally taken ibuprofen for this pain which helps her. She reports some fatigue. She has no other concerns at this time.   BP 128/66 mmHg  Pulse 61  Temp(Src) 98.2 F (36.8 C)  Ht 5\' 6"  (1.676 m)  Wt 171 lb 12.8 oz (77.928 kg)  BMI 27.74 kg/m2

## 2015-12-08 NOTE — Progress Notes (Signed)
[place] Work  Clinical Social Work met with pt and her daughter to work on Location manager for assistance; Cancer Care and [facility] in [place]. Pt and daughter brought most needed documentation. CSW submitted application for Cancer Care assistance. Pt and daughter to gather additional documentation and bring back to CSW in order to complete [employer] in [place] application. CSW to continue to follow and assist.    Clinical Social Work interventions:  Resource assistance and referral  Loren Racer, [place]  [place] Phone: [phone] Fax: [phone]

## 2015-12-08 NOTE — Progress Notes (Signed)
   Weekly Management Note:  Outpatient    ICD-9-CM ICD-10-CM   1. Breast cancer of upper-outer quadrant of right female breast ([place]) 174.4 C50.411   2. Cancer of lower-inner quadrant of left female breast ([facility]) 174.3 C50.312     Current Dose:  48 Gy  Projected Dose: 60 Gy   Narrative:  The patient presents for routine under treatment assessment.  CBCT/MVCT images/Port film x-rays were reviewed.  The chart was checked. Doing well. Some fatigue  Physical Findings:  height is 5\' 6"  (1.676 m) and weight is 171 lb 12.8 oz (77.928 kg). Her temperature is 98.2 F (36.8 C). Her blood pressure is 128/66 and her pulse is 61.   Wt Readings from Last 3 Encounters:  12/08/15 171 lb 12.8 oz (77.928 kg)  12/01/15 174 lb 1.6 oz (78.971 kg)  11/24/15 174 lb 4.8 oz (79.062 kg)   Mild skin hyperpigmentation over breasts.  Impression:  The patient is tolerating radiotherapy.  Plan:  Continue radiotherapy as planned.   ________________________________   Eppie Gibson, M.D.

## 2015-12-09 ENCOUNTER — Ambulatory Visit: Payer: Medicare Other

## 2015-12-09 ENCOUNTER — Ambulatory Visit
Admission: RE | Admit: 2015-12-09 | Discharge: 2015-12-09 | Disposition: A | Discharge status: Home / Self Care | Payer: Medicare Other | Source: Ambulatory Visit | Attending: Radiation Oncology | Admitting: Radiation Oncology

## 2015-12-09 DIAGNOSIS — Z510 Encounter for antineoplastic radiation therapy: Secondary | ICD-10-CM | POA: Diagnosis not present

## 2015-12-10 ENCOUNTER — Ambulatory Visit
Admission: RE | Admit: 2015-12-10 | Discharge: 2015-12-10 | Disposition: A | Discharge status: Home / Self Care | Payer: Medicare Other | Source: Ambulatory Visit | Attending: Radiation Oncology | Admitting: Radiation Oncology

## 2015-12-10 ENCOUNTER — Ambulatory Visit: Payer: Medicare Other

## 2015-12-10 DIAGNOSIS — Z510 Encounter for antineoplastic radiation therapy: Secondary | ICD-10-CM | POA: Diagnosis not present

## 2015-12-11 ENCOUNTER — Ambulatory Visit: Payer: Medicare Other

## 2015-12-11 ENCOUNTER — Ambulatory Visit
Admission: RE | Admit: 2015-12-11 | Discharge: 2015-12-11 | Disposition: A | Discharge status: Home / Self Care | Payer: Medicare Other | Source: Ambulatory Visit | Attending: Radiation Oncology | Admitting: Radiation Oncology

## 2015-12-11 DIAGNOSIS — Z510 Encounter for antineoplastic radiation therapy: Secondary | ICD-10-CM | POA: Diagnosis not present

## 2015-12-12 ENCOUNTER — Ambulatory Visit
Admission: RE | Admit: 2015-12-12 | Discharge: 2015-12-12 | Disposition: A | Discharge status: Home / Self Care | Payer: Medicare Other | Source: Ambulatory Visit | Attending: Radiation Oncology | Admitting: Radiation Oncology

## 2015-12-12 ENCOUNTER — Ambulatory Visit: Payer: Medicare Other

## 2015-12-12 DIAGNOSIS — Z510 Encounter for antineoplastic radiation therapy: Secondary | ICD-10-CM | POA: Diagnosis not present

## 2015-12-15 ENCOUNTER — Ambulatory Visit
Admission: RE | Admit: 2015-12-15 | Discharge: 2015-12-15 | Disposition: A | Discharge status: Home / Self Care | Payer: Medicare Other | Source: Ambulatory Visit | Attending: Radiation Oncology | Admitting: Radiation Oncology

## 2015-12-15 ENCOUNTER — Encounter: Payer: Self-pay | Admitting: Radiation Oncology

## 2015-12-15 ENCOUNTER — Ambulatory Visit: Payer: Medicare Other

## 2015-12-15 VITALS — BP 106/58 | HR 57 | Temp 98.4°F | Ht 66.0 in | Wt 172.1 lb

## 2015-12-15 DIAGNOSIS — C50411 Malignant neoplasm of upper-outer quadrant of right female breast: Secondary | ICD-10-CM

## 2015-12-15 DIAGNOSIS — Z510 Encounter for antineoplastic radiation therapy: Secondary | ICD-10-CM | POA: Diagnosis not present

## 2015-12-15 DIAGNOSIS — C50312 Malignant neoplasm of lower-inner quadrant of left female breast: Secondary | ICD-10-CM

## 2015-12-15 NOTE — Progress Notes (Signed)
   Weekly Management Note:  Outpatient    ICD-9-CM ICD-10-CM   1. Breast cancer of upper-outer quadrant of right female breast ([place]) 174.4 C50.411   2. Cancer of lower-inner quadrant of left female breast ([facility]) 174.3 C50.312     Current Dose:  58 Gy  Projected Dose: 60 Gy   Narrative:  The patient presents for routine under treatment assessment.  CBCT/MVCT images/Port film x-rays were reviewed.  The chart was checked. Doing well. Some fatigue. Skin itches at left IM fold  Physical Findings:  height is 5\' 6"  (1.676 m) and weight is 172 lb 1.6 oz (78.064 kg). Her temperature is 98.4 F (36.9 C). Her blood pressure is 106/58 and her pulse is 57.   Wt Readings from Last 3 Encounters:  12/15/15 172 lb 1.6 oz (78.064 kg)  12/08/15 171 lb 12.8 oz (77.928 kg)  12/01/15 174 lb 1.6 oz (78.971 kg)   Mild skin hyperpigmentation over breasts. Erythematous yeast like rash at left IM fold.  Skin intact.  Impression:  The patient is tolerating radiotherapy.  Plan:  Continue radiotherapy as planned. Anti yeast powder given to apply to skin folds in tx areas. F/u in 5-6 weeks or so. Call if rash worsens.  ________________________________   Eppie Gibson, M.D.

## 2015-12-15 NOTE — Progress Notes (Signed)
Ms. Minion is here for her 29th fraction of radiation to her Left and Right Breast. She denies pain or fatigue. The skin to her Right Breast is slightly red, but she states the tenderness is better than last week. She does have some redness to her  Right axilla area and under her breast. Her left breast is red underneath, and slightly red to her axilla area. She does state she has some itching to the area below her Left Breast. There is no peeling noted. She is using the radiaplex cream twice daily.   BP 106/58 mmHg  Pulse 57  Temp(Src) 98.4 F (36.9 C)  Ht 5\' 6"  (1.676 m)  Wt 172 lb 1.6 oz (78.064 kg)  BMI 27.79 kg/m2

## 2015-12-16 ENCOUNTER — Encounter: Payer: Self-pay | Admitting: Radiation Oncology

## 2015-12-16 ENCOUNTER — Ambulatory Visit
Admission: RE | Admit: 2015-12-16 | Discharge: 2015-12-16 | Disposition: A | Discharge status: Home / Self Care | Payer: Medicare Other | Source: Ambulatory Visit | Attending: Radiation Oncology | Admitting: Radiation Oncology

## 2015-12-16 ENCOUNTER — Ambulatory Visit: Payer: Medicare Other

## 2015-12-16 DIAGNOSIS — Z510 Encounter for antineoplastic radiation therapy: Secondary | ICD-10-CM | POA: Diagnosis not present

## 2015-12-18 ENCOUNTER — Telehealth: Payer: Self-pay | Admitting: *Deleted

## 2015-12-18 NOTE — Telephone Encounter (Signed)
  Oncology Nurse Navigator Documentation        Abnormal Finding Date: 06/09/15 (12/18/15 1100) Confirmed Diagnosis Date: 06/13/15 (12/18/15 1100) Surgery Date: 08/28/15 (12/18/15 1100) Treatment Initiated Date: 08/28/15 (12/18/15 1100) Patient Visit Type: RadOnc (12/18/15 1100) Treatment Phase: Final Radiation Tx (12/18/15 1100)                            Time Spent with Patient: 15 (12/18/15 1100)

## 2015-12-19 ENCOUNTER — Telehealth: Payer: Self-pay | Admitting: Oncology

## 2015-12-19 ENCOUNTER — Other Ambulatory Visit: Payer: Self-pay | Admitting: Adult Health

## 2015-12-19 DIAGNOSIS — C50411 Malignant neoplasm of upper-outer quadrant of right female breast: Secondary | ICD-10-CM

## 2015-12-19 NOTE — Telephone Encounter (Signed)
left msg for SCP visit

## 2015-12-23 ENCOUNTER — Ambulatory Visit: Payer: Medicare Other | Admitting: Oncology

## 2015-12-29 ENCOUNTER — Ambulatory Visit (HOSPITAL_BASED_OUTPATIENT_CLINIC_OR_DEPARTMENT_OTHER): Payer: Medicare Other | Admitting: Oncology

## 2015-12-29 ENCOUNTER — Telehealth: Payer: Self-pay | Admitting: Oncology

## 2015-12-29 VITALS — BP 115/56 | HR 65 | Temp 98.2°F | Resp 18 | Ht 66.0 in | Wt 171.5 lb

## 2015-12-29 DIAGNOSIS — Z79811 Long term (current) use of aromatase inhibitors: Secondary | ICD-10-CM

## 2015-12-29 DIAGNOSIS — C50411 Malignant neoplasm of upper-outer quadrant of right female breast: Secondary | ICD-10-CM

## 2015-12-29 DIAGNOSIS — Z170 Estrogen receptor positive status [ER+]: Secondary | ICD-10-CM

## 2015-12-29 MED ORDER — ANASTROZOLE 1 MG PO TABS: 1.0000 mg | ORAL_TABLET | Freq: Every day | ORAL | Status: DC

## 2015-12-29 NOTE — Progress Notes (Signed)
[facility]  [phone] [phone]     ID: Christina [name] DOB: 10/09/1945  MR#: [id number]  CSN#:[id number]  Patient Care Team: Pcp Not In System as PCP - General Gustav C Magrinat, MD as Consulting Physician (Oncology) Sarah Squire, MD as Attending Physician (Radiation Oncology) Thomas Cornett, MD as Consulting Physician (General Surgery) Richard M Pavelock, MD as Consulting Physician (Specialist) PCP: Pcp Not In System GYN: OTHER MD:  CHIEF COMPLAINT: Estrogen receptor positive breast cancer  CURRENT TREATMENT: anastrozole   BREAST CANCER HISTORY: From the original intake note:    [name] had screening mammography (not available for review today) showing a possible mass in the right breast. On 06/09/2015 she underwent unilateral right mammography with tomosynthesis at the breast Center. This found the breast density to be category D. The area of distortion seen on the 2-D screening did not persist, but there was a different area of distortion in the outer right breast above the nipple. This was palpable at the 9:00 position 3 cm from the nipple. Ultrasound showed 2 adjacent irregular hypoechoic masses, the larger containing internal echogenic foci. This measured 1.9 cm. 5 mm lateral to this the second mass measured 1.2 cm maximally. Taken together the 2 masses measured 3.3 cm maximally.  Biopsy of the 2 masses in question 06/13/2015 showed ([id number]) identical invasive ductal carcinomas, estrogen receptor 100% positive, progesterone receptor 100% positive, both with strong staining intensity, with an MIB-1 of 5%, and no HER-2 amplification, the signals ratio being 1.15 and the number per cell 1.95.  The patient's subsequent history is as detailed below  INTERVAL HISTORY: Christina [name]  Returns today for follow-up of her estrogen receptor positive breast cancer. Since her last visit here she underwent bilateral lumpectomies on 08/28/2015. This showed, on  the right, an invasive ductal carcinoma measuring 3 cm, grade 1, with all 3 sentinel lymph nodes clear. On the left, there was an invasive ductal carcinoma measuring 1.3 cm, grade 1, with the single sentinel lymph node clear. Margins were close but negative repeat HER-2 for both tumors was again negative, with signals ratio is between 1.33 and 1.50, and number per cell between 1.80 and 2.10.  Oncotype DX was obtained from the larger tumor, and showed a score of 5, predicting a risk of recurrence outside the breast within 10 years of 5% if the patient's only systemic therapy was tamoxifen for 5 years. It also predicted no benefit from chemotherapy.  Accordingly Tharon proceeded directly to radiation, which she completed 12/15/2015.  She is here today to discuss anti-estrogen therapy  REVIEW OF SYSTEMS: She did well with the radiation, with little skin change and only mild fatigue. Otherwise she tells me "everything is going well". She does have hot flashes, and she said "it is better to have them than not". Aside from these issues a detailed review of systems today was noncontributory  PAST MEDICAL HISTORY: Past Medical History  Diagnosis Date  . Hypertension   . High cholesterol   . Breast cancer of upper-outer quadrant of right female breast (HCC) 06/23/2015  . Breast cancer (HCC)   . Anemia     PAST SURGICAL HISTORY: Past Surgical History  Procedure Laterality Date  . Breast lumpectomy with needle localization and axillary sentinel lymph node bx Bilateral 08/28/2015    Procedure: BILATERAL BREAST LUMPECTOMY WITH BILATERAL  NEEDLE LOCALIZATION AND BILATERAL AXILLARY SENTINEL LYMPH NODE BX;  Surgeon: Thomas Cornett, MD;  Location: [facility] OR;  Service: General;  Laterality: Bilateral;      FAMILY HISTORY No family history on file. The patient's parents died when she was very young. She thinks her father may have died from "high blood pressure", and her mother from cancer, possibly of the uterus.  He is alive 3 brothers, 2 sisters. One brother died with lung cancer. He had a history of tobacco abuse. One other brother and one sister have died from complications of diabetes  GYNECOLOGIC HISTORY:  No LMP recorded. Patient has had a hysterectomy. Menarche age 20, first live birth age 27, the patient is GX P2. She does not recall exactly when she stopped having periods. She never took hormone replacement. She never used oral contraceptives.  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 At home she lives with her daughter Shannon Czerwonka myeloma who works in financial aid, and son Jasper Maday, who is unemployed and has a history of schizophrenia.    ADVANCED DIRECTIVES: Not in place. At the initial clinic visit the patient was given the appropriate forms to complete and notarize at her discretion.  HEALTH MAINTENANCE: Social History  Substance Use Topics  . Smoking status: Never Smoker   . Smokeless tobacco: Not on file  . Alcohol Use: No     Colonoscopy: never  PAP:  Bone density: remote  Lipid panel:  Allergies  Allergen Reactions  . Aspirin Nausea Only  . Onion Nausea And Vomiting    White onion  . Hydrocodone Rash    With itching    Current Outpatient Prescriptions  Medication Sig Dispense Refill  . acetaminophen (TYLENOL) 325 MG tablet Take 650 mg by mouth every 6 (six) hours as needed.    . anastrozole (ARIMIDEX) 1 MG tablet Take 1 tablet (1 mg total) by mouth daily. 90 tablet 4  . atenolol (TENORMIN) 25 MG tablet Take 25 mg by mouth daily.    . ibuprofen (ADVIL,MOTRIN) 800 MG tablet Take 1 tablet (800 mg total) by mouth 3 (three) times daily. (Patient taking differently: Take 800 mg by mouth every 8 (eight) hours as needed for mild pain. ) 21 tablet 0  . LOSARTAN POTASSIUM PO Take 100 mg by mouth daily.    . triamterene-hydrochlorothiazide (MAXZIDE-25) 37.5-25 MG tablet Take 1 tablet by mouth daily.  5   No current  facility-administered medications for this visit.    OBJECTIVE: Light skinned African-American woman in no acute distress  Filed Vitals:   12/29/15 1619  BP: 115/56  Pulse: 65  Temp: 98.2 F (36.8 C)  Resp: 18     Body mass index is 27.69 kg/(m^2).    ECOG FS:1 - Symptomatic but completely ambulatory  Sclerae unicteric, disconjugate gaze as previously noted Oropharynx clear and moist No cervical or supraclavicular adenopathy Lungs no rales or rhonchi Heart regular rate and rhythm Abd soft, nontender, positive bowel sounds MSK no focal spinal tenderness, no upper extremity lymphedema Neuro: nonfocal, well oriented, appropriate affect Breasts: both breasts are status post lumpectomy and radiation. There is no evidence of disease recurrence. In both breasts, left greater than right, in the inframammary fold there is still a little bit of erythema and very minimal desquamation. In the left breast lower outer quadrant there is a 2 mm subcutaneous mass that most likely scar tissue but will need to be followed. Both axillae are benign.  LAB RESULTS:  CMP     Component Value Date/Time   NA 139 08/26/2015 1603   NA 141 07/02/2015 1331   K 4.8 08/26/2015 1603     K 5.3* 07/02/2015 1331   CL 106 08/26/2015 1603   CO2 25 08/26/2015 1603   CO2 24 07/02/2015 1331   GLUCOSE 105* 08/26/2015 1603   GLUCOSE 111 07/02/2015 1331   BUN 32* 08/26/2015 1603   BUN 26.0 07/02/2015 1331   CREATININE 1.58* 08/26/2015 1603   CREATININE 1.4* 07/02/2015 1331   CALCIUM 9.8 08/26/2015 1603   CALCIUM 10.3 07/02/2015 1331   PROT 7.6 08/26/2015 1603   PROT 7.6 07/02/2015 1331   ALBUMIN 4.1 08/26/2015 1603   ALBUMIN 4.0 07/02/2015 1331   AST 23 08/26/2015 1603   AST 18 07/02/2015 1331   ALT 21 08/26/2015 1603   ALT 13 07/02/2015 1331   ALKPHOS 102 08/26/2015 1603   ALKPHOS 108 07/02/2015 1331   BILITOT 0.5 08/26/2015 1603   BILITOT 0.48 07/02/2015 1331   GFRNONAA 33* 08/26/2015 1603   GFRAA 38*  08/26/2015 1603    INo results found for: SPEP, UPEP  Lab Results  Component Value Date   WBC 5.3 08/26/2015   NEUTROABS 2.0 08/26/2015   HGB 9.6* 08/26/2015   HCT 30.2* 08/26/2015   MCV 83.0 08/26/2015   PLT 248 08/26/2015      Chemistry      Component Value Date/Time   NA 139 08/26/2015 1603   NA 141 07/02/2015 1331   K 4.8 08/26/2015 1603   K 5.3* 07/02/2015 1331   CL 106 08/26/2015 1603   CO2 25 08/26/2015 1603   CO2 24 07/02/2015 1331   BUN 32* 08/26/2015 1603   BUN 26.0 07/02/2015 1331   CREATININE 1.58* 08/26/2015 1603   CREATININE 1.4* 07/02/2015 1331      Component Value Date/Time   CALCIUM 9.8 08/26/2015 1603   CALCIUM 10.3 07/02/2015 1331   ALKPHOS 102 08/26/2015 1603   ALKPHOS 108 07/02/2015 1331   AST 23 08/26/2015 1603   AST 18 07/02/2015 1331   ALT 21 08/26/2015 1603   ALT 13 07/02/2015 1331   BILITOT 0.5 08/26/2015 1603   BILITOT 0.48 07/02/2015 1331       No results found for: LABCA2  No components found for: LABCA125  No results for input(s): INR in the last 168 hours.  Urinalysis    Component Value Date/Time   COLORURINE yellow 04/01/2010 0000   APPEARANCEUR Clear 04/01/2010 0000   LABSPEC 1.010 04/01/2010 0000   PHURINE 7.5 04/01/2010 0000   HGBUR negative 04/01/2010 0000   BILIRUBINUR negative 04/01/2010 0000   UROBILINOGEN 0.2 04/01/2010 0000   NITRITE negative 04/01/2010 0000    STUDIES: No results found.  ASSESSMENT: 69 y.o. [place] woman status post right breast biopsy 2 on 06/13/2015 for morphologically identical invasive ductal carcinomas measuring 1.9 and 1.2 cm, grade 2, estrogen and progesterone receptor strongly positive, HER-2 not amplified, with an MIB-1 of 5%  (1) bilateral lumpectomies 08/28/2015 showed  (a) on the right, apT2 pN0,stage IIA invasive ductal carcinoma, grade 1, with repeat HER-2 again negative  (b) on the left, apT1c pN0, stage IA invasive ductal carcinoma, grade 1, with repeat HER-2 again  negative  (2) Oncotype score of 5 predicts a risk of outside the breast recurrence within 10 years of 5% if the patient's only systemic therapy is tamoxifen for 5 years. It also predicts no significant benefit from chemotherapy   (3) adjuvant radiation completed 12/15/2015  (4) anastrozole started 12/29/2015  PLAN: Akelia has completed local treatment for her breast cancer and now is ready to start systemic therapy. We reviewed her Oncotype report, which   is very favorable. It indicates that without chemotherapy she has a very low risk of outside the breast recurrence on the assumption that she takes antiestrogen's for 5 years  Of course in the [place] everybody took tamoxifen because that database was obtained from a time when aromatase inhibitors were just coming in to use. Today we know that 5 years of anastrozole is superior to 5 years of tamoxifen internist of decreasing the risk of breast cancer.  Accordingly we discussed anastrozole today. She has a good understanding of the possible toxicities, side effects and complications. We also discussed price issues and if the price through her pharmacy turns out to be much more than $12 a month or so she will let us know.  Otherwise she will return to see [place] in about 6 weeks. If she is tolerating this well we will initiate six-month follow-up at that time. We will also set her up for bone density scan. Otherwise we will consider switching to tamoxifen.        Chauncey Cruel, MD   12/29/2015 4:24 PM Medical Oncology and Hematology [facility] [address] [place], [place] [address] Tel. [phone]    Fax. [phone]

## 2015-12-29 NOTE — Telephone Encounter (Signed)
appt made and avs printed °

## 2015-12-31 NOTE — Progress Notes (Signed)
  Radiation Oncology         [phone] ________________________________  Name: Christina [name] MRN: [id number]  Date: 12/16/2015  DOB: 07/08/1946  End of Treatment Note  Stage II pT2N0M0 Right Breast UOQ Invasive Ductal Carcinoma with DCIS, ER+ / PR+ / Her2neg, Grade 1  Stage I T1cN0M0 Left breast LIQ invasive ductal carcinoma with DCIS,ER+ / PR+ / Her2neg,  Grade 1   Indication for treatment:  Curative        Radiation treatment dates:  11/05/2015-12/16/2015  Site/dose:   1)  Right Breast / 46 Gy in 23 fractions 2) Right Breast Boost / 14 Gy in 7 fractions 3) Left Breast / 46 Gy in 23 fractions 4) Left Breast Boost / 14 Gy in 7 fractions  Beams/energy:   1) 3D tangents / 10X 2) 3D photon boost / 6X 3) 3D tangents / 6X 4) electrons / 12 and 9 MeV  Narrative: The patient tolerated radiation treatment relatively well.      Plan: The patient has completed radiation treatment. The patient will return to radiation oncology clinic for routine followup in one month. I advised them to call or return sooner if they have any questions or concerns related to their recovery or treatment.  -----------------------------------  Eppie Gibson, MD

## 2016-01-08 ENCOUNTER — Telehealth: Payer: Self-pay | Admitting: *Deleted

## 2016-01-08 DIAGNOSIS — C50312 Malignant neoplasm of lower-inner quadrant of left female breast: Secondary | ICD-10-CM

## 2016-01-08 DIAGNOSIS — C50411 Malignant neoplasm of upper-outer quadrant of right female breast: Secondary | ICD-10-CM

## 2016-01-08 MED ORDER — ANASTROZOLE 1 MG PO TABS: 1.0000 mg | ORAL_TABLET | Freq: Every day | ORAL | Status: DC

## 2016-01-08 MED FILL — ANASTROZOLE 1 MG TABLET: 1 | 90 days supply | Qty: 90 | Fill #0

## 2016-01-08 NOTE — Telephone Encounter (Signed)
FYI "The medication ordered by Doctor Magriat will cost $500.00 at [facility] because I do not have a prescription drug program.  I know he wants me to start this but I cannot afford $500.00."  Return  Number [phone]. [facility].  The brand they have now will cost $9.80 for a three month supply.  Order sent electronically and patient notified.

## 2016-01-18 ENCOUNTER — Ambulatory Visit (HOSPITAL_COMMUNITY)
Admission: EM | Admit: 2016-01-18 | Discharge: 2016-01-18 | Disposition: A | Discharge status: Home / Self Care | Payer: Medicare Other | Attending: Family Medicine | Admitting: Family Medicine

## 2016-01-18 ENCOUNTER — Encounter (HOSPITAL_COMMUNITY): Payer: Self-pay | Admitting: *Deleted

## 2016-01-18 ENCOUNTER — Ambulatory Visit (INDEPENDENT_AMBULATORY_CARE_PROVIDER_SITE_OTHER): Payer: Medicare Other

## 2016-01-18 DIAGNOSIS — G8929 Other chronic pain: Secondary | ICD-10-CM | POA: Diagnosis not present

## 2016-01-18 DIAGNOSIS — M179 Osteoarthritis of knee, unspecified: Secondary | ICD-10-CM

## 2016-01-18 DIAGNOSIS — M25561 Pain in right knee: Secondary | ICD-10-CM

## 2016-01-18 DIAGNOSIS — M1712 Unilateral primary osteoarthritis, left knee: Secondary | ICD-10-CM

## 2016-01-18 DIAGNOSIS — M25562 Pain in left knee: Secondary | ICD-10-CM

## 2016-01-18 MED ORDER — TRAMADOL HCL 50 MG PO TABS: 50.0000 mg | ORAL_TABLET | Freq: Four times a day (QID) | ORAL | Status: DC | PRN

## 2016-01-18 NOTE — ED Notes (Signed)
Pt  Reports  Pain  In  Both legs  intermittantly    Over  sev  Months   Pain  Was    In  r  Knee  Area     Which  Rotated  To  Left  Leg         Pt   denys  Any  Pain  At this  Time    -  Pt       denys  Any  Injury  At  This  Time      Pt is  Sitting  Upright on  The  Exam table  Speaking in  Complete  sentances  And  Is  In no  Severe  Distress

## 2016-01-18 NOTE — ED Provider Notes (Signed)
CSN: [id number]     Arrival date & time 01/18/16  1648 History   None    Chief Complaint  Patient presents with  . Leg Pain   (Consider location/radiation/quality/duration/timing/severity/associated sxs/prior Treatment) Patient is a 70 y.o. female presenting with leg pain. The history is provided by the patient.  Leg Pain Location:  Knee Injury: no   Knee location:  L knee Pain details:    Quality:  Aching and sharp   Radiates to:  Does not radiate   Severity:  Moderate   Onset quality:  Gradual   Timing:  Intermittent   Progression:  Improving Chronicity:  Chronic Dislocation: no   Prior injury to area:  No Relieved by:  None tried Worsened by:  Activity and bearing weight Ineffective treatments:  None tried Risk factors: no concern for non-accidental trauma, no frequent fractures, no known bone disorder, no obesity and no recent illness     Past Medical History  Diagnosis Date  . Hypertension   . High cholesterol   . Breast cancer of upper-outer quadrant of right female breast ([place]) 06/23/2015  . Breast cancer ([place])   . Anemia    Past Surgical History  Procedure Laterality Date  . Breast lumpectomy with needle localization and axillary sentinel lymph node bx Bilateral 08/28/2015    Procedure: BILATERAL BREAST LUMPECTOMY WITH BILATERAL  NEEDLE LOCALIZATION AND BILATERAL AXILLARY SENTINEL LYMPH NODE BX;  Surgeon: Erroll Luna, MD;  Location: [facility];  Service: General;  Laterality: Bilateral;   History reviewed. No pertinent family history. Social History  Substance Use Topics  . Smoking status: Never Smoker   . Smokeless tobacco: None  . Alcohol Use: No   OB History    No data available     Review of Systems  All other systems reviewed and are negative.   Allergies  Aspirin; Onion; and Hydrocodone  Home Medications   Prior to Admission medications   Medication Sig Start Date End Date Taking? Authorizing Provider  acetaminophen (TYLENOL) 325 MG tablet  Take 650 mg by mouth every 6 (six) hours as needed.    Historical Provider, MD  anastrozole (ARIMIDEX) 1 MG tablet Take 1 tablet (1 mg total) by mouth daily. 01/08/16   Chauncey Cruel, MD  atenolol (TENORMIN) 25 MG tablet Take 25 mg by mouth daily.    Historical Provider, MD  ibuprofen (ADVIL,MOTRIN) 800 MG tablet Take 1 tablet (800 mg total) by mouth 3 (three) times daily. Patient taking differently: Take 800 mg by mouth every 8 (eight) hours as needed for mild pain.  08/27/13   Lucila Maine, PA-C  LOSARTAN POTASSIUM PO Take 100 mg by mouth daily.    Historical Provider, MD  traMADol (ULTRAM) 50 MG tablet Take 1 tablet (50 mg total) by mouth every 6 (six) hours as needed for severe pain. 01/18/16   Rhetta Mura Dawn Kiper, NP  triamterene-hydrochlorothiazide (MAXZIDE-25) 37.5-25 MG tablet Take 1 tablet by mouth daily. 06/08/15   Historical Provider, MD   Meds Ordered and Administered this Visit  Medications - No data to display  BP 125/74 mmHg  Temp(Src) 98.6 F (37 C) (Oral)  Resp 16  SpO2 100% No data found.   Physical Exam  Constitutional: She appears well-developed and well-nourished.  HENT:  Head: Normocephalic.  Eyes: Conjunctivae are normal.  Cardiovascular: Normal rate.   Pulmonary/Chest: Effort normal.  Musculoskeletal:       Left knee: She exhibits swelling. She exhibits no erythema.  Mild swelling noted to the  lateral patellar region. No erythema. No warmth.     ED Course  Procedures (including critical care time)  Labs Review Labs Reviewed - No data to display  Imaging Review Dg Knee 2 Views Left  01/18/2016  CLINICAL DATA:  Knee pain, no known injury, initial encounter EXAM: LEFT KNEE - 1-2 VIEW COMPARISON:  None. FINDINGS: Mild degenerative changes are noted. No acute fracture dislocation is seen. No soft tissue abnormality is noted. IMPRESSION: Mild degenerative change without acute abnormality. Electronically Signed   By: Inez Catalina M.D.   On: 01/18/2016 17:58      Visual Acuity Review  Right Eye Distance:   Left Eye Distance:   Bilateral Distance:    Right Eye Near:   Left Eye Near:    Bilateral Near:         MDM  1. (L) Knee pain: No trauma. (degenerative changes on XR) Worse w/ wt bearing when pain occurs.  2. (R) knee pain: Both are chronic and currently not hurting. Suspect arthritc flares. Encouraged to take Ibuprofen 600-800 mg TID PRN pain. Tramadol 50 mg q6h PRN more severe pain. Heat. Ortho MD referral provide for f/u as needed.     Jeryl Columbia, NP 01/18/16 [place], NP 01/18/16 [id number]

## 2016-01-18 NOTE — Discharge Instructions (Signed)
Take Ibuprofen 600-800 mg three times as needed for pan when your knees hurt. Take the Tramadol as directed for more severe pain. You may benefit from a heating pad as well. If symptoms persist or worsen arrange follow up with the orthopedic MD provided.   Arthritis Arthritis means joint pain. It can also mean joint disease. A joint is a place where bones come together. People who have arthritis may have:  Red joints.  Swollen joints.  Stiff joints.  Warm joints.  A fever.  A feeling of being sick. HOME CARE Pay attention to any changes in your symptoms. Take these actions to help with your pain and swelling. Medicines  Take over-the-counter and prescription medicines only as told by your doctor.  Do not take aspirin for pain if your doctor says that you may have gout. Activities  Rest your joint if your doctor tells you to.  Avoid activities that make the pain worse.  Exercise your joint regularly as told by your doctor. Try doing exercises like:  Swimming.  Water aerobics.  Biking.  Walking. Joint Care  If your joint is swollen, keep it raised (elevated) if told by your doctor.  If your joint feels stiff in the morning, try taking a warm shower.  If you have diabetes, do not apply heat without asking your doctor.  If told, apply heat to the joint:  Put a towel between the joint and the hot pack or heating pad.  Leave the heat on the area for 20-30 minutes.  If told, apply ice to the joint:  Put ice in a plastic bag.  Place a towel between your skin and the bag.  Leave the ice on for 20 minutes, 2-3 times per day.  Keep all follow-up visits as told by your doctor. GET HELP IF:  The pain gets worse.  You have a fever. GET HELP RIGHT AWAY IF:  You have very bad pain in your joint.  You have swelling in your joint.  Your joint is red.  Many joints become painful and swollen.  You have very bad back pain.  Your leg is very weak.  You cannot  control your pee (urine) or poop (stool).   This information is not intended to replace advice given to you by your health care provider. Make sure you discuss any questions you have with your health care provider.   Document Released: 11/03/2009 Document Revised: 04/30/2015 Document Reviewed: 11/04/2014 Elsevier Interactive Patient Education 2016 [place] therapy can help ease sore, stiff, injured, and tight muscles and joints. Heat relaxes your muscles, which may help ease your pain. Heat therapy should only be used on old, pre-existing, or long-lasting (chronic) injuries. Do not use heat therapy unless told by your doctor. HOW TO USE HEAT THERAPY There are several different kinds of heat therapy, including:  Moist heat pack.  Warm water bath.  Hot water bottle.  Electric heating pad.  Heated gel pack.  Heated wrap.  Electric heating pad. GENERAL HEAT THERAPY RECOMMENDATIONS   Do not sleep while using heat therapy. Only use heat therapy while you are awake.  Your skin may turn pink while using heat therapy. Do not use heat therapy if your skin turns red.  Do not use heat therapy if you have new pain.  High heat or long exposure to heat can cause burns. Be careful when using heat therapy to avoid burning your skin.  Do not use heat therapy on areas of  your skin that are already irritated, such as with a rash or sunburn. GET HELP IF:   You have blisters, redness, swelling (puffiness), or numbness.  You have new pain.  Your pain is worse. MAKE SURE YOU:  Understand these instructions.  Will watch your condition.  Will get help right away if you are not doing well or get worse.   This information is not intended to replace advice given to you by your health care provider. Make sure you discuss any questions you have with your health care provider.   Document Released: 11/01/2011 Document Revised: 08/30/2014 Document Reviewed:  10/02/2013 Elsevier Interactive Patient Education 2016 Elsevier Inc.  Joint Pain Joint pain can be caused by many things. The joint can be bruised, infected, weak from aging, or sore from exercise. The pain will probably go away if you follow your doctor's instructions for home care. If your joint pain continues, more tests may be needed to help find the cause of your condition. HOME CARE Watch your condition for any changes. Follow these instructions as told to lessen the pain that you are feeling:  Take medicines only as told by your doctor.  Rest the sore joint for as long as told by your doctor. If your doctor tells you to, raise (elevate) the painful joint above the level of your heart while you are sitting or lying down.  Do not do things that cause pain or make the pain worse.  If told, put ice on the painful area:  Put ice in a plastic bag.  Place a towel between your skin and the bag.  Leave the ice on for 20 minutes, 2-3 times per day.  Wear an elastic bandage, splint, or sling as told by your doctor. Loosen the bandage or splint if your fingers or toes lose feeling (become numb) and tingle, or if they turn cold and blue.  Begin exercising or stretching the joint as told by your doctor. Ask your doctor what types of exercise are safe for you.  Keep all follow-up visits as told by your doctor. This is important. GET HELP IF:  Your pain gets worse and medicine does not help it.  Your joint pain does not get better in 3 days.  You have more bruising or swelling.  You have a fever.  You lose 10 pounds (4.5 kg) or more without trying. GET HELP RIGHT AWAY IF:  You are not able to move the joint.  Your fingers or toes become numb or they turn cold and blue.   This information is not intended to replace advice given to you by your health care provider. Make sure you discuss any questions you have with your health care provider.   Document Released: 07/28/2009 Document  Revised: 08/30/2014 Document Reviewed: 05/21/2014 Elsevier Interactive Patient Education 2016 Elsevier Inc.  Knee Pain Knee pain is a common problem. It can have many causes. The pain often goes away by following your doctor's home care instructions. Treatment for ongoing pain will depend on the cause of your pain. If your knee pain continues, more tests may be needed to diagnose your condition. Tests may include X-rays or other imaging studies of your knee. HOME CARE  Take medicines only as told by your doctor.  Rest your knee and keep it raised (elevated) while you are resting.  Do not do things that cause pain or make your pain worse.  Avoid activities where both feet leave the ground at the same time, such as  running, jumping rope, or doing jumping jacks.  Apply ice to the knee area:  Put ice in a plastic bag.  Place a towel between your skin and the bag.  Leave the ice on for 20 minutes, 2-3 times a day.  Ask your doctor if you should wear an elastic knee support.  Sleep with a pillow under your knee.  Lose weight if you are overweight. Being overweight can make your knee hurt more.  Do not use any tobacco products, including cigarettes, chewing tobacco, or electronic cigarettes. If you need help quitting, ask your doctor. Smoking may slow the healing of any bone and joint problems that you may have. GET HELP IF:  Your knee pain does not stop, it changes, or it gets worse.  You have a fever along with knee pain.  Your knee gives out or locks up.  Your knee becomes more swollen. GET HELP RIGHT AWAY IF:   Your knee feels hot to the touch.  You have chest pain or trouble breathing.   This information is not intended to replace advice given to you by your health care provider. Make sure you discuss any questions you have with your health care provider.   Document Released: 11/05/2008 Document Revised: 08/30/2014 Document Reviewed: 10/10/2013 Elsevier Interactive  Patient Education [employer].

## 2016-01-28 ENCOUNTER — Ambulatory Visit: Admission: RE | Admit: 2016-01-28 | Payer: Medicare Other | Source: Ambulatory Visit | Admitting: Radiation Oncology

## 2016-01-28 ENCOUNTER — Encounter: Payer: Self-pay | Admitting: Radiation Oncology

## 2016-01-28 HISTORY — DX: Personal history of irradiation: Z92.3

## 2016-02-06 ENCOUNTER — Encounter: Payer: Self-pay | Admitting: Radiation Oncology

## 2016-02-06 ENCOUNTER — Ambulatory Visit
Admission: RE | Admit: 2016-02-06 | Discharge: 2016-02-06 | Disposition: A | Discharge status: Home / Self Care | Payer: Medicare Other | Source: Ambulatory Visit | Attending: Radiation Oncology | Admitting: Radiation Oncology

## 2016-02-06 VITALS — BP 118/52 | HR 70 | Temp 98.4°F | Ht 66.0 in | Wt 173.6 lb

## 2016-02-06 DIAGNOSIS — C50411 Malignant neoplasm of upper-outer quadrant of right female breast: Secondary | ICD-10-CM | POA: Insufficient documentation

## 2016-02-06 DIAGNOSIS — C50312 Malignant neoplasm of lower-inner quadrant of left female breast: Secondary | ICD-10-CM

## 2016-02-06 NOTE — Progress Notes (Addendum)
Ms. Cornwall presents for follow up of radiation 12/16/15 to her Right and Left Breast. She denies pain. She is working and exercising to help fatigue. The skin to her Bialteral Breasts are healed and normal appearing. She is still using Radiaplex cream, and will switch to Vitamin E cream when completed. She has started taking her Anastrozole recently after she received financial assistance. She has no other concerns at this time.   BP 118/52 mmHg  Pulse 70  Temp(Src) 98.4 F (36.9 C)  Ht 5\' 6"  (1.676 m)  Wt 173 lb 9.6 oz (78.744 kg)  BMI 28.03 kg/m2   Wt Readings from Last 3 Encounters:  02/06/16 173 lb 9.6 oz (78.744 kg)  12/29/15 171 lb 8 oz (77.792 kg)  12/15/15 172 lb 1.6 oz (78.064 kg)

## 2016-02-06 NOTE — Progress Notes (Signed)
Radiation Oncology         [phone] ________________________________  Name: Christina [name] MRN: [id number]  Date: 02/06/2016  DOB: 05-Dec-1945  Follow-Up Visit Note  Outpatient  CC: Pcp Not In System  Cornett, [name], MD  Diagnosis:    ICD-9-CM ICD-10-CM   1. Breast cancer of upper-outer quadrant of right female breast ([place]) 174.4 C50.411   2. Cancer of lower-inner quadrant of left female breast ([place]) 174.3 C50.312     Stage II [id number] Right Breast UOQ Invasive Ductal Carcinoma with DCIS, ER+ / PR+ / Her2neg, Grade 1  Stage I [id number] Left breast LIQ invasive ductal carcinoma with DCIS, ER+ / PR+ / Her2neg,  Grade 1   Indication for treatment:  Curative        Radiation treatment dates:  11/05/2015-12/16/2015  Site/dose:   1) Right Breast / 46 Gy in 23 fractions 2) Right Breast Boost / 14 Gy in 7 fractions 3) Left Breast / 46 Gy in 23 fractions 4) Left Breast Boost / 14 Gy in 7 fractions  Narrative:  The patient returns today for routine follow-up.  She is doing well.   She continues to be followed by medical oncology and is taking antiestrogen therapy   She is exercising and working and her skin has healed well. She will transition to Vitamin E lotion  ALLERGIES:  is allergic to aspirin; onion; and hydrocodone.  Meds: Current Outpatient Prescriptions  Medication Sig Dispense Refill  . acetaminophen (TYLENOL) 325 MG tablet Take 650 mg by mouth every 6 (six) hours as needed.    [name] anastrozole (ARIMIDEX) 1 MG tablet Take 1 tablet (1 mg total) by mouth daily. 90 tablet 4  . ibuprofen (ADVIL,MOTRIN) 800 MG tablet Take 1 tablet (800 mg total) by mouth 3 (three) times daily. (Patient taking differently: Take 800 mg by mouth every 8 (eight) hours as needed for mild pain. ) 21 tablet 0  . LOSARTAN POTASSIUM PO Take 100 mg by mouth daily.    . traMADol (ULTRAM) 50 MG tablet Take 1 tablet (50 mg total) by mouth every 6 (six) hours as needed for severe pain. 15 tablet 0  .  triamterene-hydrochlorothiazide (MAXZIDE-25) 37.5-25 MG tablet Take 1 tablet by mouth daily.  5  . atenolol (TENORMIN) 25 MG tablet Take 25 mg by mouth daily. She is taking this every other day for one week, then stop completely, and be rechecked by her PCP     No current facility-administered medications for this encounter.    Physical Findings: The patient is in no acute distress. Patient is alert and oriented.  height is 5\' 6"  (1.676 m) and weight is 173 lb 9.6 oz (78.744 kg). Her temperature is 98.4 F (36.9 C). Her blood pressure is 118/52 and her pulse is 70. . Bilateral breast exam - skin looks excellent with minimal discernable radiation changes  Lab Findings: Lab Results  Component Value Date   WBC 5.3 08/26/2015   HGB 9.6* 08/26/2015   HCT 30.2* 08/26/2015   MCV 83.0 08/26/2015   PLT 248 08/26/2015     Radiographic Findings: Dg Knee 2 Views Left  01/18/2016  CLINICAL DATA:  Knee pain, no known injury, initial encounter EXAM: LEFT KNEE - 1-2 VIEW COMPARISON:  None. FINDINGS: Mild degenerative changes are noted. No acute fracture dislocation is seen. No soft tissue abnormality is noted. IMPRESSION: Mild degenerative change without acute abnormality. Electronically Signed   By: Inez Catalina M.D.   On: 01/18/2016 17:58  Impression/Plan:  Healing well from radiotherapy. I encouraged her to continue with anti estrogens, yearly mammography and followup with medical oncology. I will see her back on an as-needed basis. I have encouraged her to call if she has any issues or concerns in the future. I wished her the very best.  _____________________________________   Eppie Gibson, MD  This document serves as a record of services personally performed by Eppie Gibson, MD. It was created on her behalf by Derek Mound, a trained medical scribe. The creation of this record is based on the scribe's personal observations and the provider's statements to them. This document has been  checked and approved by the attending provider.

## 2016-02-10 MED FILL — traMADol HCL 50 MG TABS: 50 | 3 days supply | Qty: 15 | Fill #0

## 2016-02-12 ENCOUNTER — Ambulatory Visit (HOSPITAL_BASED_OUTPATIENT_CLINIC_OR_DEPARTMENT_OTHER): Payer: Medicare Other | Admitting: Oncology

## 2016-02-12 ENCOUNTER — Telehealth: Payer: Self-pay | Admitting: Oncology

## 2016-02-12 VITALS — BP 112/55 | HR 72 | Temp 98.5°F | Resp 18 | Ht 66.0 in | Wt 172.6 lb

## 2016-02-12 DIAGNOSIS — M8580 Other specified disorders of bone density and structure, unspecified site: Secondary | ICD-10-CM

## 2016-02-12 DIAGNOSIS — C50412 Malignant neoplasm of upper-outer quadrant of left female breast: Secondary | ICD-10-CM | POA: Diagnosis not present

## 2016-02-12 DIAGNOSIS — C50411 Malignant neoplasm of upper-outer quadrant of right female breast: Secondary | ICD-10-CM

## 2016-02-12 DIAGNOSIS — Z79811 Long term (current) use of aromatase inhibitors: Secondary | ICD-10-CM | POA: Diagnosis not present

## 2016-02-12 NOTE — Progress Notes (Signed)
[facility]  [phone] [phone]     ID: Christina [name] DOB: 1945-09-14  MR#: [id number]  IDP#:[id number]  Patient Care Team: Pcp Not In System as PCP - General Christina Cruel, MD as Consulting Physician (Oncology) Christina Gibson, MD as Attending Physician (Radiation Oncology) Christina Luna, MD as Consulting Physician (General Surgery) Christina Docker, MD as Consulting Physician (Specialist) PCP: Pcp Not In System GYN: OTHER MD:  CHIEF COMPLAINT: Estrogen receptor positive breast cancer  CURRENT TREATMENT: anastrozole   BREAST CANCER HISTORY: From the original intake note:    Christina [name] had screening mammography (not available for review today) showing a possible mass in the right breast. On 06/09/2015 she underwent unilateral right mammography with tomosynthesis at the breast Center. This found the breast density to be category D. The area of distortion seen on the 2-D screening did not persist, but there was a different area of distortion in the outer right breast above the nipple. This was palpable at the 9:00 position 3 cm from the nipple. Ultrasound showed 2 adjacent irregular hypoechoic masses, the larger containing internal echogenic foci. This measured 1.9 cm. 5 mm lateral to this the second mass measured 1.2 cm maximally. Taken together the 2 masses measured 3.3 cm maximally.  Biopsy of the 2 masses in question 06/13/2015 showed ([id number]) identical invasive ductal carcinomas, estrogen receptor 100% positive, progesterone receptor 100% positive, both with strong staining intensity, with an MIB-1 of 5%, and no HER-2 amplification, the signals ratio being 1.15 and the number per cell 1.95.  The patient's subsequent history is as detailed below  INTERVAL HISTORY: Christina [name] returns today for follow-up of her breast cancer. She started anastrozole approximately a month ago and she is tolerating it remarkably well. She has not noted any more  hot flashes and vaginal dryness is not an issue. She does not have arthralgias or myalgias related to this. She obtains it at a good price.  REVIEW OF SYSTEMS: She had some knee pain which was evaluated with knee films which really just showed arthritis. Otherwise she is back to work. She has a little bit of sensitivity in the surgical axilla, but no significant pain. A detailed review of systems today was otherwise noncontributory  PAST MEDICAL HISTORY: Past Medical History  Diagnosis Date  . Hypertension   . High cholesterol   . Breast cancer of upper-outer quadrant of right female breast ([place]) 06/23/2015  . Breast cancer ([place])   . Anemia   . Hx of radiation therapy 11/05/15- 12/16/15    Right Breast and Left Breast    PAST SURGICAL HISTORY: Past Surgical History  Procedure Laterality Date  . Breast lumpectomy with needle localization and axillary sentinel lymph node bx Bilateral 08/28/2015    Procedure: BILATERAL BREAST LUMPECTOMY WITH BILATERAL  NEEDLE LOCALIZATION AND BILATERAL AXILLARY SENTINEL LYMPH NODE BX;  Surgeon: Christina Luna, MD;  Location: [facility];  Service: General;  Laterality: Bilateral;    FAMILY HISTORY No family history on file. The patient's parents died when she was very young. She thinks her father may have died from "high blood pressure", and her mother from cancer, possibly of the uterus. He is alive 3 brothers, 2 sisters. One brother died with lung cancer. He had a history of tobacco abuse. One other brother and one sister have died from complications of diabetes  GYNECOLOGIC HISTORY:  No LMP recorded. Patient has had a hysterectomy. Menarche age 79, first live birth age 35, the patient is [place]  P2. She does not recall exactly when she stopped having periods. She never took hormone replacement. She never used oral contraceptives.  SOCIAL HISTORY:  Christina [name] works in the [employer] on record. At home she lives with her daughter Christina [name]  myeloma who works in [employer], and son Christina [name], who is unemployed and has a history of schizophrenia.    ADVANCED DIRECTIVES: Not in place. At the initial clinic visit the patient was given the appropriate forms to complete and notarize at her discretion.  HEALTH MAINTENANCE: Social History  Substance Use Topics  . Smoking status: Never Smoker   . Smokeless tobacco: Not on file  . Alcohol Use: No     Colonoscopy: never  PAP:  Bone density: remote  Lipid panel:  Allergies  Allergen Reactions  . Aspirin Nausea Only  . Onion Nausea And Vomiting    White onion  . Hydrocodone Rash    With itching    Current Outpatient Prescriptions  Medication Sig Dispense Refill  . losartan-hydrochlorothiazide (HYZAAR) 100-12.5 MG tablet Take 1 tablet by mouth daily.    [name] acetaminophen (TYLENOL) 325 MG tablet Take 650 mg by mouth every 6 (six) hours as needed.    [name] anastrozole (ARIMIDEX) 1 MG tablet Take 1 tablet (1 mg total) by mouth daily. 90 tablet 4  . ibuprofen (ADVIL,MOTRIN) 800 MG tablet Take 1 tablet (800 mg total) by mouth 3 (three) times daily. (Patient taking differently: Take 800 mg by mouth every 8 (eight) hours as needed for mild pain. ) 21 tablet 0  . traMADol (ULTRAM) 50 MG tablet Take 1 tablet (50 mg total) by mouth every 6 (six) hours as needed for severe pain. 15 tablet 0   No current facility-administered medications for this visit.    OBJECTIVE: Light skinned African-American woman  who appears well Filed Vitals:   02/12/16 1555  BP: 112/55  Pulse: 72  Temp: 98.5 F (36.9 C)  Resp: 18     Body mass index is 27.87 kg/(m^2).    ECOG FS:1 - Symptomatic but completely ambulatory  Sclerae unicteric, dysconjugate gaze as noted Oropharynx clear and moist-- no thrush or other lesions No cervical or supraclavicular adenopathy Lungs no rales or rhonchi Heart regular rate and rhythm Abd soft, nontender, positive bowel sounds MSK no focal spinal tenderness, no  upper extremity lymphedema Neuro: nonfocal, well oriented, appropriate affect Breasts: deferred   LAB RESULTS:  CMP     Component Value Date/Time   NA 139 08/26/2015 1603   NA 141 07/02/2015 1331   K 4.8 08/26/2015 1603   K 5.3* 07/02/2015 1331   CL 106 08/26/2015 1603   CO2 25 08/26/2015 1603   CO2 24 07/02/2015 1331   GLUCOSE 105* 08/26/2015 1603   GLUCOSE 111 07/02/2015 1331   BUN 32* 08/26/2015 1603   BUN 26.0 07/02/2015 1331   CREATININE 1.58* 08/26/2015 1603   CREATININE 1.4* 07/02/2015 1331   CALCIUM 9.8 08/26/2015 1603   CALCIUM 10.3 07/02/2015 1331   PROT 7.6 08/26/2015 1603   PROT 7.6 07/02/2015 1331   ALBUMIN 4.1 08/26/2015 1603   ALBUMIN 4.0 07/02/2015 1331   AST 23 08/26/2015 1603   AST 18 07/02/2015 1331   ALT 21 08/26/2015 1603   ALT 13 07/02/2015 1331   ALKPHOS 102 08/26/2015 1603   ALKPHOS 108 07/02/2015 1331   BILITOT 0.5 08/26/2015 1603   BILITOT 0.48 07/02/2015 1331   GFRNONAA 33* 08/26/2015 1603   GFRAA 38* 08/26/2015  [place] results found for: SPEP, UPEP  Lab Results  Component Value Date   WBC 5.3 08/26/2015   NEUTROABS 2.0 08/26/2015   HGB 9.6* 08/26/2015   HCT 30.2* 08/26/2015   MCV 83.0 08/26/2015   PLT 248 08/26/2015      Chemistry      Component Value Date/Time   NA 139 08/26/2015 1603   NA 141 07/02/2015 1331   K 4.8 08/26/2015 1603   K 5.3* 07/02/2015 1331   CL 106 08/26/2015 1603   CO2 25 08/26/2015 1603   CO2 24 07/02/2015 1331   BUN 32* 08/26/2015 1603   BUN 26.0 07/02/2015 1331   CREATININE 1.58* 08/26/2015 1603   CREATININE 1.4* 07/02/2015 1331      Component Value Date/Time   CALCIUM 9.8 08/26/2015 1603   CALCIUM 10.3 07/02/2015 1331   ALKPHOS 102 08/26/2015 1603   ALKPHOS 108 07/02/2015 1331   AST 23 08/26/2015 1603   AST 18 07/02/2015 1331   ALT 21 08/26/2015 1603   ALT 13 07/02/2015 1331   BILITOT 0.5 08/26/2015 1603   BILITOT 0.48 07/02/2015 1331       No results found for: LABCA2  No  components found for: LABCA125  No results for input(s): INR in the last 168 hours.  Urinalysis    Component Value Date/Time   COLORURINE yellow 04/01/2010 0000   APPEARANCEUR Clear 04/01/2010 0000   LABSPEC 1.010 04/01/2010 0000   PHURINE 7.5 04/01/2010 0000   HGBUR negative 04/01/2010 0000   BILIRUBINUR negative 04/01/2010 0000   UROBILINOGEN 0.2 04/01/2010 0000   NITRITE negative 04/01/2010 0000    STUDIES: Dg Knee 2 Views Left  01/18/2016  CLINICAL DATA:  Knee pain, no known injury, initial encounter EXAM: LEFT KNEE - 1-2 VIEW COMPARISON:  None. FINDINGS: Mild degenerative changes are noted. No acute fracture dislocation is seen. No soft tissue abnormality is noted. IMPRESSION: Mild degenerative change without acute abnormality. Electronically Signed   By: Inez Catalina M.D.   On: 01/18/2016 17:58    ASSESSMENT: 70 y.o. [place] woman status post right breastUpper outer quadrant biopsy 2 on 06/13/2015 for morphologically identical invasive ductal carcinomas measuring 1.9 and 1.2 cm, grade 2, estrogen and progesterone receptor strongly positive, HER-2 not amplified, with an MIB-1 of 5%  (1) bilateral lumpectomies 08/28/2015 showed  (a) on the right, apT2 pN0,stage IIA invasive ductal carcinoma, grade 1, with repeat HER-2 again negative  (b) on the left, apT1c pN0, stage IA invasive ductal carcinoma, grade 1, with repeat HER-2 again negative  (2) Oncotype score of 5 predicts a risk of outside the breast recurrence within 10 years of 5% if the patient's only systemic therapy is tamoxifen for 5 years. It also predicts no significant benefit from chemotherapy   (3) adjuvant radiation completed 12/15/2015  (4) anastrozole started 12/29/2015  PLAN: Jonie is tolerating the anastrozole remarkably well.The plan will be to continue that for a total of 5 years.  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 She would prefer to wait until January. January of course is when she normally has her mammogram.  Accordingly I'm going to see her the second week in January, after her mammogram and I have also ordered a bone density at the same time. Depending on the T score we will decide  whether she needs is phosphonate therapy  She knows to call for any problems that may develop before her next visit here.      Christina Cruel, MD   02/12/2016 4:14 PM Medical Oncology and Hematology [facility] [address] [place], [place] [address] Tel. [phone]    Fax. [phone]

## 2016-02-12 NOTE — Telephone Encounter (Signed)
appt made and avs printed °

## 2016-02-13 NOTE — Progress Notes (Signed)
[employer] Social worker Note  Diagnosis:    ICD-9-CM ICD-10-CM   1. Breast cancer of upper-outer quadrant of right female breast ([place]) 174.4 C50.411   2. Cancer of lower-inner quadrant of left female breast ([place]) 174.3 C50.312     The patient's CT images from her CT simulation were reviewed to plan her boost treatment to her right breast  lumpectomy cavity. The boost to the lumpectomy cavity will be delivered with 2 photon fields using MLCs for custom blocks again heart and lungs, with 6 MV photon energy.  This constitutes 2 complex treatment devices. Isodose plan was reviewed and approved. 14 Gy in 7 fractions prescribed.  The patient's CT images from her CT simulation were reviewed to plan her boost treatment to her left breast  lumpectomy cavity. The boost to the lumpectomy cavity will be delivered with 12 and 9 MeV electrons, with an en face field, and custom electron cut out block (1 complex tx device). [employer].  14 Gy in 7 fractions has been prescribed to the 100% isodose line.  -----------------------------------  Eppie Gibson, MD

## 2016-02-16 ENCOUNTER — Telehealth: Payer: Self-pay | Admitting: Oncology

## 2016-02-16 MED FILL — LOSARTAN-HCTZ 100-12.5 MG T: 100-12.5 | 30 days supply | Qty: 30 | Fill #0

## 2016-02-16 NOTE — Telephone Encounter (Signed)
returned call and lvm for pt to call back to r/s °

## 2016-02-17 ENCOUNTER — Encounter: Payer: Medicare Other | Admitting: Nurse Practitioner

## 2016-02-18 ENCOUNTER — Encounter: Payer: Self-pay | Admitting: Nurse Practitioner

## 2016-02-18 DIAGNOSIS — C50411 Malignant neoplasm of upper-outer quadrant of right female breast: Secondary | ICD-10-CM

## 2016-02-18 NOTE — Progress Notes (Signed)
The Survivorship Care Plan was mailed to Christina [name] after her missed visit in the Survivorship Clinic for an in-person visit at this time. A letter was mailed to her outlining the purpose of the content of the care plan, as well as encouraging her to reach out to me with any questions or concerns.  My business card was included in the correspondence to the patient as well. A copy was not mailed to her PCP as we have no PCP on file.  I will not be placing any follow-up appointments to the Survivorship Clinic for Christina [name], but I am happy to see her at any time in the future for any survivorship concerns that may arise. Thank you for allowing me to participate in her care!  Kenn File, [place] [phone]

## 2016-03-11 MED FILL — LOSARTAN-HCTZ 50-12.5 MG TA: 50-12.5 | 30 days supply | Qty: 30 | Fill #0

## 2016-04-12 MED FILL — LOSARTAN-HCTZ 50-12.5 MG TA: 50-12.5 | 30 days supply | Qty: 30 | Fill #1

## 2016-04-12 MED FILL — ANASTROZOLE 1 MG TABLET: 1 | 90 days supply | Qty: 90 | Fill #1

## 2016-05-14 MED FILL — LOSARTAN-HCTZ 50-12.5 MG TA: 50-12.5 | 30 days supply | Qty: 30 | Fill #2

## 2016-05-20 ENCOUNTER — Encounter: Payer: Self-pay | Admitting: *Deleted

## 2016-05-20 ENCOUNTER — Ambulatory Visit
Admission: RE | Admit: 2016-05-20 | Discharge: 2016-05-20 | Disposition: A | Discharge status: Home / Self Care | Payer: Medicare Other | Source: Ambulatory Visit | Attending: Oncology | Admitting: Oncology

## 2016-05-20 DIAGNOSIS — M8580 Other specified disorders of bone density and structure, unspecified site: Secondary | ICD-10-CM

## 2016-05-20 DIAGNOSIS — C50411 Malignant neoplasm of upper-outer quadrant of right female breast: Secondary | ICD-10-CM

## 2016-05-20 NOTE — Progress Notes (Signed)
[place] Work  [employer] representative contacted patient at home to follow up on previous application for assistance through [employer] in [place]. CSW had assisted pt in applying for assistance several months ago and organization willing to try to assist, but pt still had paperwork to submit. CSW left message stating such and to contact CSW re. [employer] in [place] application.     Clinical Social Work interventions: Resource assistance  Loren Racer, [place] Worker [place]  [place] Phone: [phone] Fax: [phone]

## 2016-06-11 MED FILL — LOSARTAN-HCTZ 50-12.5 MG TA: 50-12.5 | 30 days supply | Qty: 30 | Fill #3

## 2016-06-16 MED FILL — TRIAMCINOLONE 0.1% CREAM: 0.1 | 30 days supply | Qty: 45 | Fill #0

## 2016-06-16 MED FILL — traMADol HCL 50 MG TABS: 50 | 30 days supply | Qty: 120 | Fill #0

## 2016-07-08 MED FILL — LOSARTAN-HCTZ 50-12.5 MG TA: 50-12.5 | 30 days supply | Qty: 30 | Fill #0

## 2016-07-08 MED FILL — ANASTROZOLE 1 MG TABLET: 1 | 90 days supply | Qty: 90 | Fill #2

## 2016-08-06 MED FILL — LOSARTAN-HCTZ 50-12.5 MG TA: 50-12.5 | 30 days supply | Qty: 30 | Fill #1

## 2016-09-01 ENCOUNTER — Other Ambulatory Visit: Payer: Self-pay | Admitting: *Deleted

## 2016-09-01 DIAGNOSIS — C50411 Malignant neoplasm of upper-outer quadrant of right female breast: Secondary | ICD-10-CM

## 2016-09-01 DIAGNOSIS — C50312 Malignant neoplasm of lower-inner quadrant of left female breast: Secondary | ICD-10-CM

## 2016-09-02 ENCOUNTER — Telehealth: Payer: Self-pay | Admitting: Oncology

## 2016-09-02 ENCOUNTER — Ambulatory Visit: Payer: Medicare Other | Admitting: Oncology

## 2016-09-02 ENCOUNTER — Other Ambulatory Visit: Payer: Medicare Other

## 2016-09-02 NOTE — Telephone Encounter (Signed)
Pt called to r/s 1/11 appt to Feb. Gave pt new appt date/time

## 2016-09-07 MED FILL — LOSARTAN-HCTZ 50-12.5 MG TA: 50-12.5 | 30 days supply | Qty: 30 | Fill #0

## 2016-09-13 MED FILL — ZOLPIDEM TARTRATE 10 MG TAB: 10 | 30 days supply | Qty: 30 | Fill #0

## 2016-09-23 ENCOUNTER — Ambulatory Visit (HOSPITAL_BASED_OUTPATIENT_CLINIC_OR_DEPARTMENT_OTHER): Payer: Medicare Other | Admitting: Oncology

## 2016-09-23 ENCOUNTER — Other Ambulatory Visit (HOSPITAL_BASED_OUTPATIENT_CLINIC_OR_DEPARTMENT_OTHER): Payer: Medicare Other

## 2016-09-23 VITALS — BP 136/69 | HR 68 | Temp 98.2°F | Resp 18 | Ht 66.0 in | Wt 176.9 lb

## 2016-09-23 DIAGNOSIS — C50312 Malignant neoplasm of lower-inner quadrant of left female breast: Secondary | ICD-10-CM

## 2016-09-23 DIAGNOSIS — Z170 Estrogen receptor positive status [ER+]: Secondary | ICD-10-CM

## 2016-09-23 DIAGNOSIS — C50411 Malignant neoplasm of upper-outer quadrant of right female breast: Secondary | ICD-10-CM

## 2016-09-23 DIAGNOSIS — Z79811 Long term (current) use of aromatase inhibitors: Secondary | ICD-10-CM

## 2016-09-23 LAB — CBC WITH DIFFERENTIAL/PLATELET
BASO%: 1.2 % (ref 0.0–2.0)
BASOS ABS: 0 10*3/uL (ref 0.0–0.1)
EOS ABS: 0.1 10*3/uL (ref 0.0–0.5)
EOS%: 3.4 % (ref 0.0–7.0)
HCT: 32.7 % — ABNORMAL LOW (ref 34.8–46.6)
HGB: 10.5 g/dL — ABNORMAL LOW (ref 11.6–15.9)
LYMPH%: 31.2 % (ref 14.0–49.7)
MCH: 26.2 pg (ref 25.1–34.0)
MCHC: 32.2 g/dL (ref 31.5–36.0)
MCV: 81.5 fL (ref 79.5–101.0)
MONO#: 0.3 10*3/uL (ref 0.1–0.9)
MONO%: 12.2 % (ref 0.0–14.0)
NEUT#: 1.4 10*3/uL — ABNORMAL LOW (ref 1.5–6.5)
NEUT%: 52 % (ref 38.4–76.8)
PLATELETS: 219 10*3/uL (ref 145–400)
RBC: 4.01 10*6/uL (ref 3.70–5.45)
RDW: 13.6 % (ref 11.2–14.5)
WBC: 2.7 10*3/uL — AB (ref 3.9–10.3)
lymph#: 0.8 10*3/uL — ABNORMAL LOW (ref 0.9–3.3)

## 2016-09-23 LAB — COMPREHENSIVE METABOLIC PANEL
ALK PHOS: 151 U/L — AB (ref 40–150)
ALT: 19 U/L (ref 0–55)
ANION GAP: 8 meq/L (ref 3–11)
AST: 23 U/L (ref 5–34)
Albumin: 4 g/dL (ref 3.5–5.0)
BUN: 21.8 mg/dL (ref 7.0–26.0)
CO2: 29 meq/L (ref 22–29)
Calcium: 10 mg/dL (ref 8.4–10.4)
Chloride: 106 mEq/L (ref 98–109)
Creatinine: 1.1 mg/dL (ref 0.6–1.1)
EGFR: 60 mL/min/{1.73_m2} — AB (ref >90)
GLUCOSE: 95 mg/dL (ref 70–140)
POTASSIUM: 4.5 meq/L (ref 3.5–5.1)
SODIUM: 143 meq/L (ref 136–145)
Total Bilirubin: 0.58 mg/dL (ref 0.20–1.20)
Total Protein: 7.4 g/dL (ref 6.4–8.3)

## 2016-09-23 NOTE — Progress Notes (Signed)
[facility]  [phone] [phone]     ID: Christina [name] DOB: [birth year]  MR#: [id number]  JEH#:[id number]  Patient Care Team: Pcp Not In System as PCP - General Chauncey Cruel, MD as Consulting Physician (Oncology) Eppie Gibson, MD as Attending Physician (Radiation Oncology) Erroll Luna, MD as Consulting Physician (General Surgery) Javier Docker, MD as Consulting Physician (Specialist) PCP: Pcp Not In System GYN: OTHER MD:  CHIEF COMPLAINT: Estrogen receptor positive breast cancer  CURRENT TREATMENT: anastrozole   BREAST CANCER HISTORY: From the original intake note:    Landis had screening mammography (not available for review today) showing a possible mass in the right breast. On 06/09/2015 she underwent unilateral right mammography with tomosynthesis at the breast Center. This found the breast density to be category D. The area of distortion seen on the 2-D screening did not persist, but there was a different area of distortion in the outer right breast above the nipple. This was palpable at the 9:00 position 3 cm from the nipple. Ultrasound showed 2 adjacent irregular hypoechoic masses, the larger containing internal echogenic foci. This measured 1.9 cm. 5 mm lateral to this the second mass measured 1.2 cm maximally. Taken together the 2 masses measured 3.3 cm maximally.  Biopsy of the 2 masses in question 06/13/2015 showed ([id number]) identical invasive ductal carcinomas, estrogen receptor 100% positive, progesterone receptor 100% positive, both with strong staining intensity, with an MIB-1 of 5%, and no HER-2 amplification, the signals ratio being 1.15 and the number per cell 1.95.  The patient's subsequent history is as detailed below  INTERVAL HISTORY: Christina [name] returns today for follow-up of her estrogen receptor positive breast cancers. She continues on anastrozole, with good tolerance. Hot flashes and vaginal dryness are not a  major issue. She never developed the arthralgias or myalgias that many patients can experience on this medication. She obtains it at a good price.  REVIEW OF SYSTEMS: She has had no intercurrent infection recently and particularly no viral symptoms. A detailed review of systems today was otherwise noncontributory  PAST MEDICAL HISTORY: Past Medical History:  Diagnosis Date  . Anemia   . Breast cancer ([place])   . Breast cancer of upper-outer quadrant of right female breast ([place]) 06/23/2015  . High cholesterol   . Hx of radiation therapy 11/05/15- 12/16/15   Right Breast and Left Breast  . Hypertension     PAST SURGICAL HISTORY: Past Surgical History:  Procedure Laterality Date  . BREAST LUMPECTOMY WITH NEEDLE LOCALIZATION AND AXILLARY SENTINEL LYMPH NODE BX Bilateral 08/28/2015   Procedure: BILATERAL BREAST LUMPECTOMY WITH BILATERAL  NEEDLE LOCALIZATION AND BILATERAL AXILLARY SENTINEL LYMPH NODE BX;  Surgeon: Erroll Luna, MD;  Location: [facility];  Service: General;  Laterality: Bilateral;    FAMILY HISTORY No family history on file. The patient's parents died when she was very young. She thinks her father may have died from "high blood pressure", and her mother from cancer, possibly of the uterus. He is alive 3 brothers, 2 sisters. One brother died with lung cancer. He had a history of tobacco abuse. One other brother and one sister have died from complications of diabetes  GYNECOLOGIC HISTORY:  No LMP recorded. Patient has had a hysterectomy. Menarche age 13, first live birth age 88, the patient is Christina [name] P2. She does not recall exactly when she stopped having periods. She never took hormone replacement. She never used oral contraceptives.  SOCIAL HISTORY:  Christina [name] works in [employer] of  the [employer] on record. At home she lives with her daughter Christina [name] myeloma who works in Youth worker, and son Christina [name], who is unemployed and has a history of  schizophrenia.    ADVANCED DIRECTIVES: Not in place. At the initial clinic visit the patient was given the appropriate forms to complete and notarize at her discretion.  HEALTH MAINTENANCE: Social History  Substance Use Topics  . Smoking status: Never Smoker  . Smokeless tobacco: Not on file  . Alcohol use No     Colonoscopy: never  PAP:  Bone density: remote  Lipid panel:  Allergies  Allergen Reactions  . Aspirin Nausea Only  . Onion Nausea And Vomiting    White onion  . Hydrocodone Rash    With itching    Current Outpatient Prescriptions  Medication Sig Dispense Refill  . acetaminophen (TYLENOL) 325 MG tablet Take 650 mg by mouth every 6 (six) hours as needed.    [name] anastrozole (ARIMIDEX) 1 MG tablet Take 1 tablet (1 mg total) by mouth daily. 90 tablet 4  . ibuprofen (ADVIL,MOTRIN) 800 MG tablet Take 1 tablet (800 mg total) by mouth 3 (three) times daily. (Patient taking differently: Take 800 mg by mouth every 8 (eight) hours as needed for mild pain. ) 21 tablet 0  . losartan-hydrochlorothiazide (HYZAAR) 100-12.5 MG tablet Take 1 tablet by mouth daily.    . traMADol (ULTRAM) 50 MG tablet Take 1 tablet (50 mg total) by mouth every 6 (six) hours as needed for severe pain. 15 tablet 0   No current facility-administered medications for this visit.     OBJECTIVE: Middle-aged African-American woman in no acute distress Vitals:   09/23/16 1440  BP: 136/69  Pulse: 68  Resp: 18  Temp: 98.2 F (36.8 C)     Body mass index is 28.55 kg/m.    ECOG FS:0 - Asymptomatic  Sclerae unicteric, pupils round and equal, etc. because previously noted Oropharynx clear and moist-- no thrush or other lesions No cervical or supraclavicular adenopathy Lungs no rales or rhonchi Heart regular rate and rhythm Abd soft, nontender, positive bowel sounds MSK no focal spinal tenderness, no upper extremity lymphedema Neuro: nonfocal, well oriented, appropriate affect Breasts: She has had  bilateral lumpectomies and has undergone bilateral radiation. There is no evidence of chest wall recurrence. Both axillae are benign.   LAB RESULTS:  CMP     Component Value Date/Time   NA 139 08/26/2015 1603   NA 141 07/02/2015 1331   K 4.8 08/26/2015 1603   K 5.3 (H) 07/02/2015 1331   CL 106 08/26/2015 1603   CO2 25 08/26/2015 1603   CO2 24 07/02/2015 1331   GLUCOSE 105 (H) 08/26/2015 1603   GLUCOSE 111 07/02/2015 1331   BUN 32 (H) 08/26/2015 1603   BUN 26.0 07/02/2015 1331   CREATININE 1.58 (H) 08/26/2015 1603   CREATININE 1.4 (H) 07/02/2015 1331   CALCIUM 9.8 08/26/2015 1603   CALCIUM 10.3 07/02/2015 1331   PROT 7.6 08/26/2015 1603   PROT 7.6 07/02/2015 1331   ALBUMIN 4.1 08/26/2015 1603   ALBUMIN 4.0 07/02/2015 1331   AST 23 08/26/2015 1603   AST 18 07/02/2015 1331   ALT 21 08/26/2015 1603   ALT 13 07/02/2015 1331   ALKPHOS 102 08/26/2015 1603   ALKPHOS 108 07/02/2015 1331   BILITOT 0.5 08/26/2015 1603   BILITOT 0.48 07/02/2015 1331   GFRNONAA 33 (L) 08/26/2015 1603   GFRAA 38 (L) 08/26/2015 1603  INo results found for: SPEP, UPEP  Lab Results  Component Value Date   WBC 2.7 (L) 09/23/2016   NEUTROABS 1.4 (L) 09/23/2016   HGB 10.5 (L) 09/23/2016   HCT 32.7 (L) 09/23/2016   MCV 81.5 09/23/2016   PLT 219 09/23/2016      Chemistry      Component Value Date/Time   NA 139 08/26/2015 1603   NA 141 07/02/2015 1331   K 4.8 08/26/2015 1603   K 5.3 (H) 07/02/2015 1331   CL 106 08/26/2015 1603   CO2 25 08/26/2015 1603   CO2 24 07/02/2015 1331   BUN 32 (H) 08/26/2015 1603   BUN 26.0 07/02/2015 1331   CREATININE 1.58 (H) 08/26/2015 1603   CREATININE 1.4 (H) 07/02/2015 1331      Component Value Date/Time   CALCIUM 9.8 08/26/2015 1603   CALCIUM 10.3 07/02/2015 1331   ALKPHOS 102 08/26/2015 1603   ALKPHOS 108 07/02/2015 1331   AST 23 08/26/2015 1603   AST 18 07/02/2015 1331   ALT 21 08/26/2015 1603   ALT 13 07/02/2015 1331   BILITOT 0.5 08/26/2015  1603   BILITOT 0.48 07/02/2015 1331       No results found for: LABCA2  No components found for: LABCA125  No results for input(s): INR in the last 168 hours.  Urinalysis    Component Value Date/Time   COLORURINE yellow 04/01/2010 0000   APPEARANCEUR Clear 04/01/2010 0000   LABSPEC 1.010 04/01/2010 0000   PHURINE 7.5 04/01/2010 0000   HGBUR negative 04/01/2010 0000   BILIRUBINUR negative 04/01/2010 0000   UROBILINOGEN 0.2 04/01/2010 0000   NITRITE negative 04/01/2010 0000    STUDIES: CLINICAL DATA: Bilateral lumpectomies. Annual mammography.  EXAM: 2D DIGITAL DIAGNOSTIC BILATERAL MAMMOGRAM WITH CAD AND ADJUNCT TOMO  COMPARISON: Previous exam(s).  ACR Breast Density Category c: The breast tissue is heterogeneously dense, which may obscure small masses.  FINDINGS: No suspicious masses, calcifications, or distortion. Postoperative changes seen on the right. The postoperative changes on the left are very posteriorly located.  Mammographic images were processed with CAD.  IMPRESSION: No mammographic evidence of malignancy.  RECOMMENDATION: Annual diagnostic mammography.  I have discussed the findings and recommendations with the patient. Results were also provided in writing at the conclusion of the visit. If applicable, a reminder letter will be sent to the patient regarding the next appointment.  BI-RADS CATEGORY 2: Benign.   Electronically Signed By: Dorise Bullion III M.D   ASSESSMENT: 71 y.o. [place] woman status post right breastUpper outer quadrant biopsy 2 on 06/13/2015 for morphologically identical invasive ductal carcinomas measuring 1.9 and 1.2 cm, grade 2, estrogen and progesterone receptor strongly positive, HER-2 not amplified, with an MIB-1 of 5%  (1) bilateral lumpectomies 08/28/2015 showed  (a) on the right, apT2 pN0,stage IIA invasive ductal carcinoma, grade 1, with repeat HER-2 again negative  (b) on the left, apT1c pN0, stage  IA invasive ductal carcinoma, grade 1, with repeat HER-2 again negative  (2) Oncotype score of 5 predicts a risk of outside the breast recurrence within 10 years of 5% if the patient's only systemic therapy is tamoxifen for 5 years. It also predicts no significant benefit from chemotherapy   (3) adjuvant radiation completed 12/15/2015  (4) anastrozole started 12/29/2015  (a) Bone density 05/12/2016 shows a T score of -1.7  PLAN: Mandisa is Now a year out from definitive surgery for her breast cancers with no evidence of disease recurrence. This is very favorable.  She is tolerating  anastrozole well and the plan will be to continue that for a total of 5 years.  She will need a repeat bone density in September 2019.  I do not have a simple explanation for her slightly low white cell count today. The other counts are normal. This really only needs to be repeated. Getting your however is very difficult for her because of transportation issues.  After much discussion I wrote a prescription for her to take to her primary care physician and at their next visit I have requested that he draw CBC with differential and fax me those results. If the white cell count remains low at that point I will bring her back and for fuller hematologic evaluation  She knows to call for any other issues that may develop before her next visit.  Chauncey Cruel, MD   09/23/2016 3:07 PM Medical Oncology and Hematology [facility] [address] [place], [place] [address] Tel. [phone]    Fax. [phone]

## 2016-09-24 ENCOUNTER — Encounter: Payer: Self-pay | Admitting: *Deleted

## 2016-09-24 NOTE — Progress Notes (Signed)
[place] Work  Clinical Social Work was referred by patient and daughter for assistance with [employer] in [place] application.  Clinical Social Worker had attempted to assist them with this resource for the first time in February of 2017. Pt and daughter were unable to bring needed documents to apply for this resource. CSW contacted patient several times via phone in attempt to assist them with this resource. CSW met with them briefly and reviewed paperwork. After further review, pt appears to have medicaid and medicare so would not benefit from this resource. CSW contacted pt via phone to explain that she may need to give medicaid number to providers in order to assist with medical bills. CSW left message for pt on daughter and her numbers stating this would make [employer] [place] application not necessary.     Clinical Social Work interventions:  Resource assistance  Loren Racer, [place] Worker [place]  [place] Phone: [phone] Fax: [phone]

## 2016-10-07 MED FILL — LOSARTAN-HCTZ 50-12.5 MG TA: 50-12.5 | 30 days supply | Qty: 30 | Fill #1

## 2016-10-07 MED FILL — ANASTROZOLE 1 MG TABLET: 1 | 90 days supply | Qty: 90 | Fill #3

## 2016-10-10 ENCOUNTER — Ambulatory Visit (HOSPITAL_COMMUNITY)
Admission: EM | Admit: 2016-10-10 | Discharge: 2016-10-10 | Disposition: A | Discharge status: Home / Self Care | Payer: Medicare Other | Attending: Family Medicine | Admitting: Family Medicine

## 2016-10-10 ENCOUNTER — Encounter (HOSPITAL_COMMUNITY): Payer: Self-pay | Admitting: *Deleted

## 2016-10-10 DIAGNOSIS — R21 Rash and other nonspecific skin eruption: Secondary | ICD-10-CM

## 2016-10-10 DIAGNOSIS — W57XXXS Bitten or stung by nonvenomous insect and other nonvenomous arthropods, sequela: Secondary | ICD-10-CM

## 2016-10-10 MED ORDER — HYDROXYZINE HCL 25 MG PO TABS: 25.0000 mg | ORAL_TABLET | Freq: Four times a day (QID) | ORAL | 0 refills | Status: DC

## 2016-10-10 MED ORDER — DOXYCYCLINE HYCLATE 100 MG PO CAPS: 100.0000 mg | ORAL_CAPSULE | Freq: Two times a day (BID) | ORAL | 0 refills | Status: DC

## 2016-10-10 NOTE — ED Triage Notes (Signed)
Rash  On  l   Arm   Arm   And  Chest  That  Started  yest

## 2016-10-11 MED FILL — hydrOXYzine HCL 25 MG TABS: 25 | 3 days supply | Qty: 12 | Fill #0

## 2016-10-11 MED FILL — DOXYCYCLINE HYCLATE 100 MG: 100 | 10 days supply | Qty: 20 | Fill #0

## 2016-10-11 NOTE — ED Provider Notes (Signed)
CSN: [id number]     Arrival date & time 10/10/16  1929 History   First MD Initiated Contact with Patient 10/10/16 2100     Chief Complaint  Patient presents with  . Rash   (Consider location/radiation/quality/duration/timing/severity/associated sxs/prior Treatment) Patient c/o rash on arms and chest that started yesterday.  The rash itches.     The history is provided by the patient.  Rash  Location:  Shoulder/arm Quality: itchiness and redness   Severity:  Moderate Onset quality:  Sudden Duration:  1 day Timing:  Constant Chronicity:  New   Past Medical History:  Diagnosis Date  . Anemia   . Breast cancer ([facility])   . Breast cancer of upper-outer quadrant of right female breast ([facility]) 06/23/2015  . High cholesterol   . Hx of radiation therapy 11/05/15- 12/16/15   Right Breast and Left Breast  . Hypertension    Past Surgical History:  Procedure Laterality Date  . BREAST LUMPECTOMY WITH NEEDLE LOCALIZATION AND AXILLARY SENTINEL LYMPH NODE BX Bilateral 08/28/2015   Procedure: BILATERAL BREAST LUMPECTOMY WITH BILATERAL  NEEDLE LOCALIZATION AND BILATERAL AXILLARY SENTINEL LYMPH NODE BX;  Surgeon: Erroll Luna, MD;  Location: [facility];  Service: General;  Laterality: Bilateral;   History reviewed. No pertinent family history. Social History  Substance Use Topics  . Smoking status: Never Smoker  . Smokeless tobacco: Not on file  . Alcohol use No   OB History    No data available     Review of Systems  Constitutional: Negative.   HENT: Negative.   Eyes: Negative.   Respiratory: Negative.   Cardiovascular: Negative.   Endocrine: Negative.   Genitourinary: Negative.   Musculoskeletal: Negative.   Skin: Positive for rash.  Allergic/Immunologic: Negative.   Neurological: Negative.   Hematological: Negative.   Psychiatric/Behavioral: Negative.     Allergies  Aspirin; Onion; and Hydrocodone  Home Medications   Prior to Admission medications   Medication Sig Start Date  End Date Taking? Authorizing Provider  acetaminophen (TYLENOL) 325 MG tablet Take 650 mg by mouth every 6 (six) hours as needed.    Historical Provider, MD  anastrozole (ARIMIDEX) 1 MG tablet Take 1 tablet (1 mg total) by mouth daily. 01/08/16   Chauncey Cruel, MD  doxycycline (VIBRAMYCIN) 100 MG capsule Take 1 capsule (100 mg total) by mouth 2 (two) times daily. 10/10/16   Lysbeth Penner, FNP  hydrOXYzine (ATARAX/VISTARIL) 25 MG tablet Take 1 tablet (25 mg total) by mouth every 6 (six) hours. 10/10/16   Lysbeth Penner, FNP  ibuprofen (ADVIL,MOTRIN) 800 MG tablet Take 1 tablet (800 mg total) by mouth 3 (three) times daily. Patient taking differently: Take 800 mg by mouth every 8 (eight) hours as needed for mild pain.  08/27/13   Lucila Maine, PA-C  losartan-hydrochlorothiazide (HYZAAR) 100-12.5 MG tablet Take 1 tablet by mouth daily.    Historical Provider, MD  traMADol (ULTRAM) 50 MG tablet Take 1 tablet (50 mg total) by mouth every 6 (six) hours as needed for severe pain. 01/18/16   Rhetta Mura Schorr, NP   Meds Ordered and Administered this Visit  Medications - No data to display  BP 168/70 (BP Location: Right Arm)   Pulse 78   Temp 98.6 F (37 C) (Oral)   Resp 18   SpO2 100%  No data found.   Physical Exam  Constitutional: She appears well-developed and well-nourished.  HENT:  Head: Normocephalic and atraumatic.  Eyes: Conjunctivae and EOM are normal. Pupils are  equal, round, and reactive to light.  Neck: Normal range of motion. Neck supple.  Cardiovascular: Normal rate, regular rhythm and normal heart sounds.   Pulmonary/Chest: Effort normal and breath sounds normal.  Skin: Rash noted.  Left arm with 2 areas of erythema approx 5 cm with central puncture and right chest with similar rash/ insect bite/ cellulitis.  Nursing note and vitals reviewed.   Urgent Care Course     Procedures (including critical care time)  Labs Review Labs Reviewed - No data to  display  Imaging Review No results found.   Visual Acuity Review  Right Eye Distance:   Left Eye Distance:   Bilateral Distance:    Right Eye Near:   Left Eye Near:    Bilateral Near:         MDM   1. Rash   2. Insect bite, sequela    Doxycycline 100mg  po bid x 10 days #20 Hydroxyzine 25mg  one po qid prn #12      Lysbeth Penner, FNP 10/11/16 1111

## 2016-11-04 MED FILL — LOSARTAN-HCTZ 50-12.5 MG TA: 50-12.5 | 30 days supply | Qty: 30 | Fill #0

## 2016-11-25 ENCOUNTER — Encounter: Payer: Self-pay | Admitting: Specialist

## 2016-12-08 MED FILL — LOSARTAN-HCTZ 50-12.5 MG TA: 50-12.5 | 30 days supply | Qty: 30 | Fill #1

## 2017-01-07 MED FILL — ANASTROZOLE 1 MG TABLET: 1 | 90 days supply | Qty: 90 | Fill #4

## 2017-01-07 MED FILL — LOSARTAN POTASSIUM-HCTZ 50-: 50-12.5 | 30 days supply | Qty: 30 | Fill #2

## 2017-02-04 MED FILL — LOSARTAN POTASSIUM-HCTZ 50-: 50-12.5 | 30 days supply | Qty: 30 | Fill #3

## 2017-02-27 ENCOUNTER — Ambulatory Visit (HOSPITAL_COMMUNITY)
Admission: EM | Admit: 2017-02-27 | Discharge: 2017-02-27 | Disposition: A | Discharge status: Home / Self Care | Payer: Medicare Other | Attending: Internal Medicine | Admitting: Internal Medicine

## 2017-02-27 ENCOUNTER — Encounter (HOSPITAL_COMMUNITY): Payer: Self-pay | Admitting: Emergency Medicine

## 2017-02-27 DIAGNOSIS — M778 Other enthesopathies, not elsewhere classified: Secondary | ICD-10-CM | POA: Diagnosis not present

## 2017-02-27 NOTE — ED Triage Notes (Signed)
The patient presented to the [facility] with a complaint of right hand pain secondary to lifting at work x 1 month.

## 2017-02-27 NOTE — ED Provider Notes (Signed)
CSN: [id number]     Arrival date & time 02/27/17  1959 History   None    Chief Complaint  Patient presents with  . Hand Pain   (Consider location/radiation/quality/duration/timing/severity/associated sxs/prior Treatment) 71 year old female who describes a repetitive job with her hands for several hours during the day is complaining of pain to the ulnar aspect of the right wrist. Denies any known injury. No blunt trauma. No pain today.      Past Medical History:  Diagnosis Date  . Anemia   . Breast cancer ([place])   . Breast cancer of upper-outer quadrant of right female breast ([place]) 06/23/2015  . High cholesterol   . Hx of radiation therapy 11/05/15- 12/16/15   Right Breast and Left Breast  . Hypertension    Past Surgical History:  Procedure Laterality Date  . BREAST LUMPECTOMY WITH NEEDLE LOCALIZATION AND AXILLARY SENTINEL LYMPH NODE BX Bilateral 08/28/2015   Procedure: BILATERAL BREAST LUMPECTOMY WITH BILATERAL  NEEDLE LOCALIZATION AND BILATERAL AXILLARY SENTINEL LYMPH NODE BX;  Surgeon: Erroll Luna, MD;  Location: [facility];  Service: General;  Laterality: Bilateral;   History reviewed. No pertinent family history. Social History  Substance Use Topics  . Smoking status: Never Smoker  . Smokeless tobacco: Not on file  . Alcohol use No   OB History    No data available     Review of Systems  Constitutional: Negative.   Musculoskeletal: Positive for arthralgias.  Skin: Negative.   All other systems reviewed and are negative.   Allergies  Aspirin; Onion; and Hydrocodone  Home Medications   Prior to Admission medications   Medication Sig Start Date End Date Taking? Authorizing Provider  acetaminophen (TYLENOL) 325 MG tablet Take 650 mg by mouth every 6 (six) hours as needed.    [provider]  anastrozole (ARIMIDEX) 1 MG tablet Take 1 tablet (1 mg total) by mouth daily. 01/08/16   Magrinat, Virgie Dad, MD  hydrOXYzine (ATARAX/VISTARIL) 25 MG tablet Take 1 tablet  (25 mg total) by mouth every 6 (six) hours. 10/10/16   Lysbeth Penner, FNP  ibuprofen (ADVIL,MOTRIN) 800 MG tablet Take 1 tablet (800 mg total) by mouth 3 (three) times daily. Patient taking differently: Take 800 mg by mouth every 8 (eight) hours as needed for mild pain.  08/27/13   Palmer, Carlos American, PA-C  losartan-hydrochlorothiazide (HYZAAR) 100-12.5 MG tablet Take 1 tablet by mouth daily.    [provider]  traMADol (ULTRAM) 50 MG tablet Take 1 tablet (50 mg total) by mouth every 6 (six) hours as needed for severe pain. 01/18/16   Schorr, Rhetta Mura, NP   Meds Ordered and Administered this Visit  Medications - No data to display  BP (!) 147/73 (BP Location: Left Arm)   Pulse 70   Temp 99.3 F (37.4 C) (Oral)   Resp 18   SpO2 100%  No data found.   Physical Exam  Constitutional: She is oriented to person, place, and time. She appears well-developed and well-nourished. No distress.  Eyes: EOM are normal.  Neck: Normal range of motion. Neck supple.  Cardiovascular: Normal rate.   Pulmonary/Chest: Effort normal. No respiratory distress.  Musculoskeletal: She exhibits no edema.  Examination the right wrist reveals no swelling, deformity, discoloration. No tenderness to the wrist or hand. Patient exhibits rapid and full range of motion of the wrist and hand. Distal neurovascular motor sensory is intact. Radial pulse 2+. Unable to elicit pain or discomfort from the wrist.  Neurological: She is  alert and oriented to person, place, and time. She exhibits normal muscle tone.  Skin: Skin is warm and dry.  Psychiatric: She has a normal mood and affect.  Nursing note and vitals reviewed.   Urgent Care Course     Procedures (including critical care time)  Labs Review Labs Reviewed - No data to display  Imaging Review No results found.   Visual Acuity Review  Right Eye Distance:   Left Eye Distance:   Bilateral Distance:    Right Eye Near:   Left Eye Near:     Bilateral Near:         MDM   1. Right wrist tendinitis    The repetitive work that you are doing is causing excessive use of the tendon of the right wrist. This causes inflammation of the tendon and is called tendinitis. Apply cold compresses to the tendon periodically. Especially after work or during pain. Wear the wrist splint at all times while working and follow-up with your primary care doctor in 2 or 3 weeks.     Janne Napoleon, NP 02/27/17 2128

## 2017-02-27 NOTE — ED Notes (Signed)
[facility]  r   Wrist   Splint  Applied

## 2017-02-27 NOTE — Discharge Instructions (Signed)
The repetitive work that you are doing is causing excessive use of the tendon of the right wrist. This causes inflammation of the tendon and is called tendinitis. Apply cold compresses to the tendon periodically. Especially after work or during pain. Wear the wrist splint at all times while working and follow-up with your primary care doctor in 2 or 3 weeks.

## 2017-03-08 ENCOUNTER — Telehealth: Payer: Self-pay | Admitting: *Deleted

## 2017-03-08 DIAGNOSIS — C50312 Malignant neoplasm of lower-inner quadrant of left female breast: Secondary | ICD-10-CM

## 2017-03-08 DIAGNOSIS — C50411 Malignant neoplasm of upper-outer quadrant of right female breast: Secondary | ICD-10-CM

## 2017-03-08 MED ORDER — ANASTROZOLE 1 MG PO TABS: 1.0000 mg | ORAL_TABLET | Freq: Every day | ORAL | 4 refills | Status: DC

## 2017-03-08 NOTE — Telephone Encounter (Signed)
Nurse received walk-in form for refill request for Anastrozole from registration representative in POD 1 area.  Further research reveals Anastrozole was started 12-29-2015, last refill one year ago with no future refills, next scheduled F/U 09-29-2017 and current with appointments.  Refill sent to [facility] as requested.   Apologized for wait time.  Refills should be called to retail pharmacy a week before pill supply runs out.  Pharmacy will contact office with request.

## 2017-03-10 ENCOUNTER — Other Ambulatory Visit: Payer: Self-pay | Admitting: Oncology

## 2017-03-10 DIAGNOSIS — Z9889 Other specified postprocedural states: Secondary | ICD-10-CM

## 2017-03-10 MED FILL — LOSARTAN POTASSIUM-HCTZ 50-: 50-12.5 | 30 days supply | Qty: 30 | Fill #0

## 2017-03-11 ENCOUNTER — Other Ambulatory Visit: Payer: Self-pay | Admitting: Oncology

## 2017-03-11 DIAGNOSIS — C50312 Malignant neoplasm of lower-inner quadrant of left female breast: Secondary | ICD-10-CM

## 2017-03-11 DIAGNOSIS — C50411 Malignant neoplasm of upper-outer quadrant of right female breast: Secondary | ICD-10-CM

## 2017-03-12 IMAGING — MG MM DIGITAL SCREENING BILAT
4 series · 4 of 4 positions shown · non-contrast
Comparison: Previous exam(s).

CLINICAL DATA: Screening.

EXAM:
DIGITAL SCREENING BILATERAL MAMMOGRAM WITH CAD

[L CC]
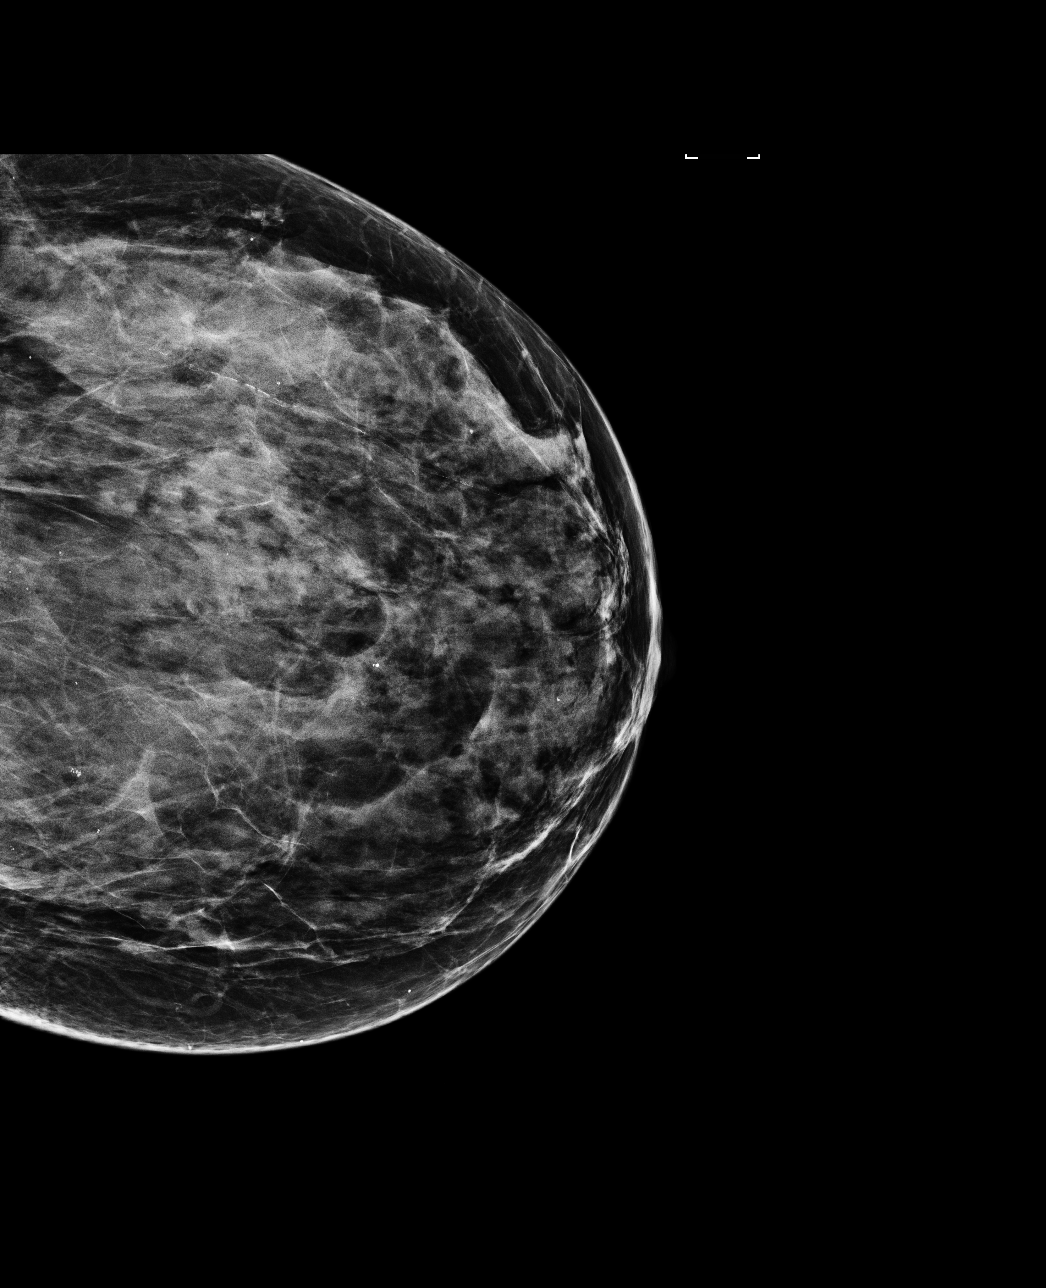

[R MLO]
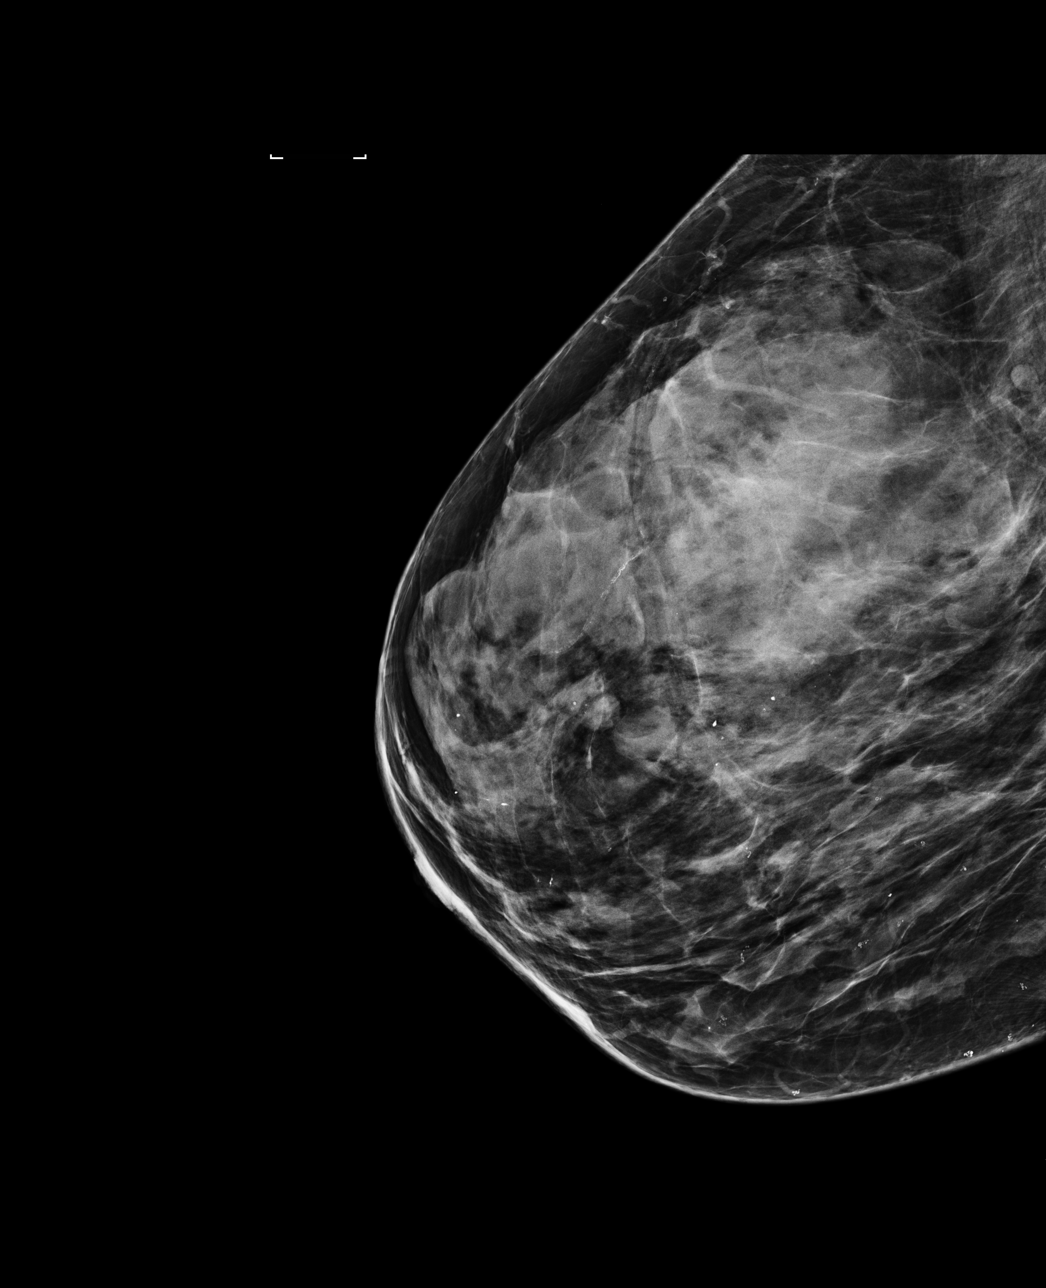

[L MLO]
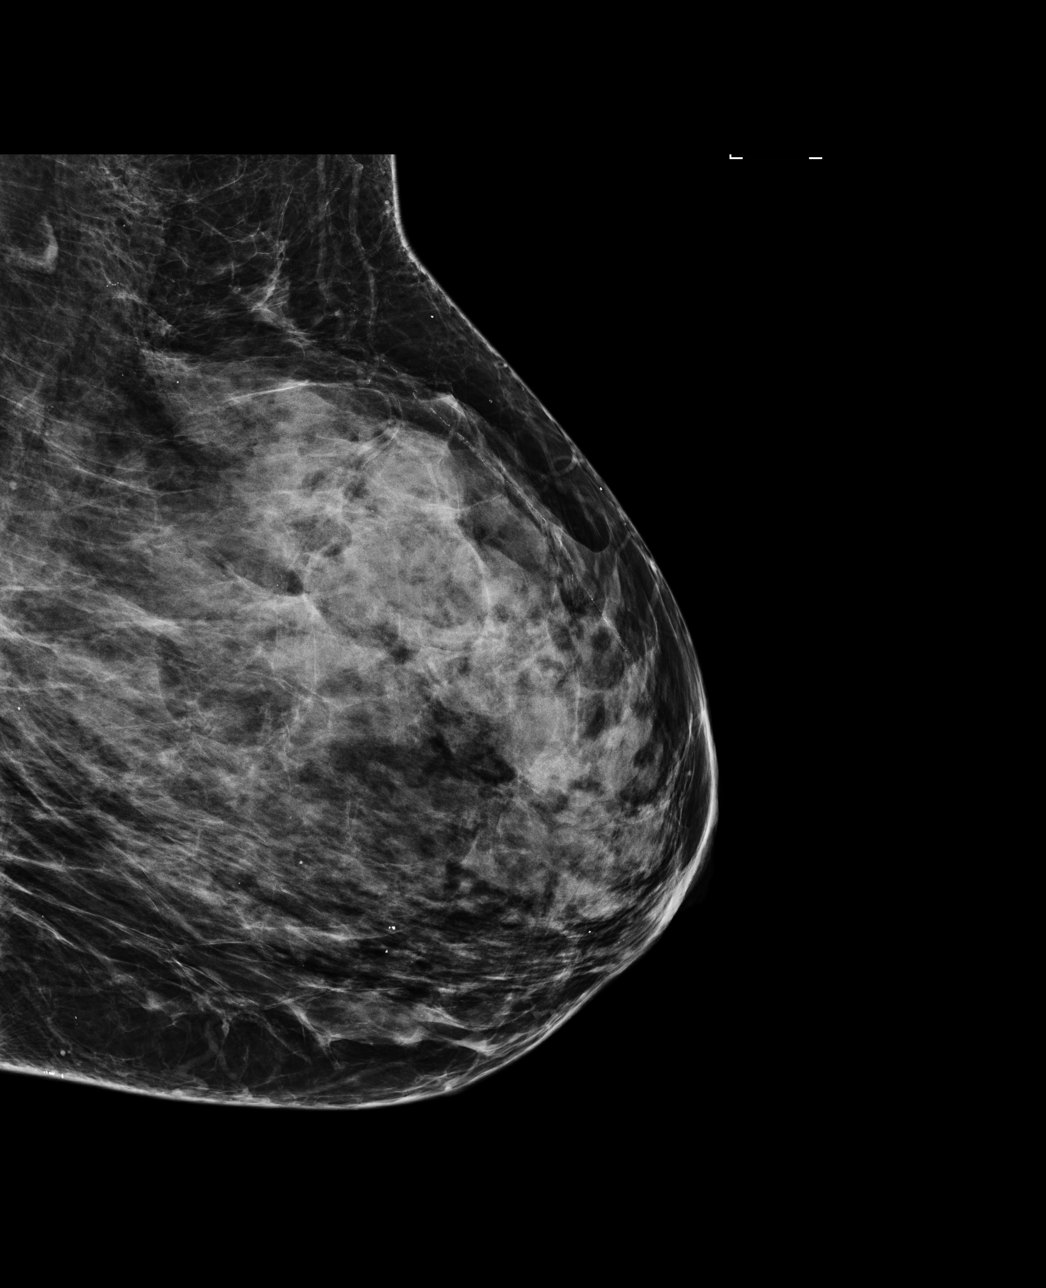

[R CC]
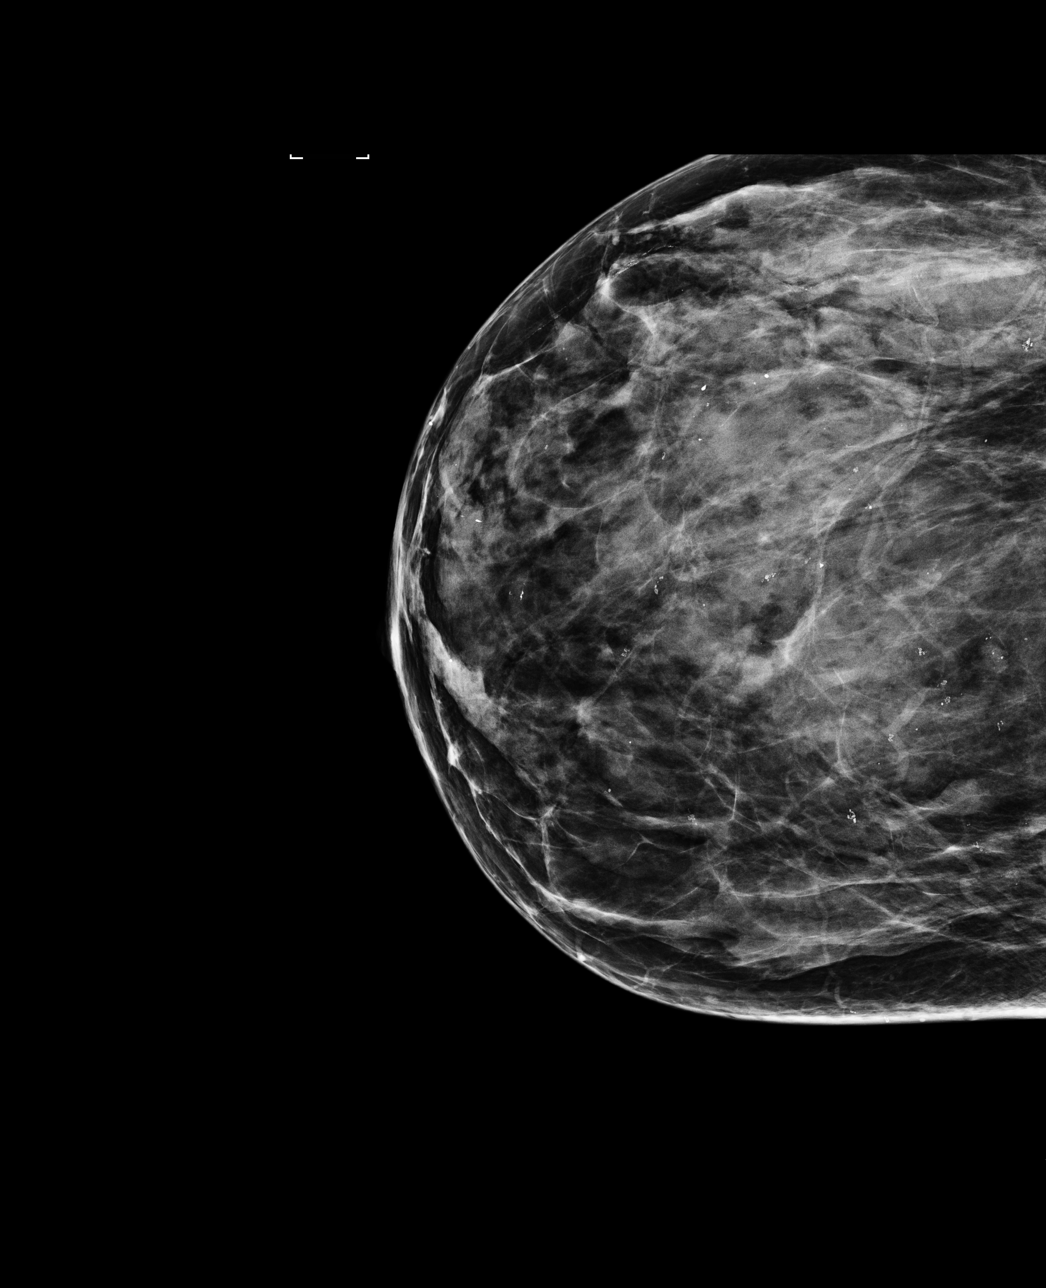

[4 of 4 positions shown; findings below may reference images not displayed]

ACR Breast Density Category c: The breast tissue is heterogeneously
dense, which may obscure small masses.
FINDINGS: In the right breast, a possible asymmetry warrants further
evaluation. In the left breast, no findings suspicious for
malignancy. Images were processed with CAD.
IMPRESSION: Further evaluation is suggested for possible asymmetry in the right
breast.

RECOMMENDATION:
Diagnostic mammogram and possibly ultrasound of the right breast.
(Code:S9-I-77H)

The patient will be contacted regarding the findings, and additional
imaging will be scheduled.

BI-RADS CATEGORY  0: Incomplete. Need additional imaging evaluation
and/or prior mammograms for comparison.

## 2017-04-05 ENCOUNTER — Telehealth: Payer: Self-pay | Admitting: *Deleted

## 2017-04-05 DIAGNOSIS — C50411 Malignant neoplasm of upper-outer quadrant of right female breast: Secondary | ICD-10-CM

## 2017-04-05 DIAGNOSIS — C50312 Malignant neoplasm of lower-inner quadrant of left female breast: Secondary | ICD-10-CM

## 2017-04-05 MED ORDER — ANASTROZOLE 1 MG PO TABS: 1.0000 mg | ORAL_TABLET | Freq: Every day | ORAL | 4 refills | Status: DC

## 2017-04-05 MED FILL — ANASTROZOLE 1 MG TABLET: 1 | 90 days supply | Qty: 90 | Fill #0

## 2017-04-05 NOTE — Telephone Encounter (Signed)
Walk in requesting "anastrozole refill be sent to [facility].  They've been trying to call but no one picks up."   Order called as requested.  Pharmacist notified order [facility] with [facility].  "[facility] to be with [facility] but now is with [place]."

## 2017-04-08 MED FILL — LOSARTAN-HCTZ 50-12.5 MG TA: 50-12.5 | 30 days supply | Qty: 30 | Fill #1

## 2017-05-02 MED FILL — LOSARTAN-HCTZ 50-12.5 MG TA: 50-12.5 | 90 days supply | Qty: 90 | Fill #0

## 2017-05-26 ENCOUNTER — Ambulatory Visit
Admission: RE | Admit: 2017-05-26 | Discharge: 2017-05-26 | Disposition: A | Discharge status: Home / Self Care | Payer: Medicare Other | Source: Ambulatory Visit | Attending: Oncology | Admitting: Oncology

## 2017-05-26 DIAGNOSIS — Z9889 Other specified postprocedural states: Secondary | ICD-10-CM

## 2017-05-26 HISTORY — DX: Personal history of irradiation: Z92.3

## 2017-07-01 MED FILL — ANASTROZOLE 1 MG TABLET: 1 | 90 days supply | Qty: 90 | Fill #1

## 2017-08-08 MED FILL — LOSARTAN-HCTZ 50-12.5 MG TA: 50-12.5 | 30 days supply | Qty: 30 | Fill #0

## 2017-08-08 MED FILL — traZODone HCL 50 MG TABS: 50 | 30 days supply | Qty: 30 | Fill #0

## 2017-09-02 MED FILL — LOSARTAN-HCTZ 50-12.5 MG TA: 50-12.5 | 30 days supply | Qty: 30 | Fill #1

## 2017-09-27 ENCOUNTER — Ambulatory Visit: Payer: Self-pay | Admitting: Oncology

## 2017-09-27 ENCOUNTER — Other Ambulatory Visit: Payer: Medicare Other

## 2017-09-29 ENCOUNTER — Other Ambulatory Visit: Payer: Medicare Other

## 2017-09-29 ENCOUNTER — Ambulatory Visit: Payer: Medicare Other | Admitting: Oncology

## 2017-09-29 MED FILL — LOSARTAN-HCTZ 50-12.5 MG TA: 50-12.5 | 30 days supply | Qty: 30 | Fill #0

## 2017-09-29 NOTE — Progress Notes (Signed)
Christina [name]  Telephone:(336) [phone] Fax:(336) [phone]     ID: Christina [name] DOB: 1946-03-03  MR#: [id number]  ONG#:[id number]  Patient Care Team: Javier Docker, MD as PCP - General (Internal Medicine) Magrinat, Virgie Dad, MD as Consulting Physician (Oncology) Eppie Gibson, MD as Attending Physician (Radiation Oncology) Erroll Luna, MD as Consulting Physician (General Surgery) Pavelock, Ralene Bathe, MD as Consulting Physician (Specialist) PCP: Javier Docker, MD GYN: OTHER MD:  CHIEF COMPLAINT: Estrogen receptor positive breast cancer  CURRENT TREATMENT: anastrozole   BREAST CANCER HISTORY: From the original intake note:    Remington had screening mammography (not available for review today) showing a possible mass in the right breast. On 06/09/2015 she underwent unilateral right mammography with tomosynthesis at the breast Center. This found the breast density to be category D. The area of distortion seen on the 2-D screening did not persist, but there was a different area of distortion in the outer right breast above the nipple. This was palpable at the 9:00 position 3 cm from the nipple. Ultrasound showed 2 adjacent irregular hypoechoic masses, the larger containing internal echogenic foci. This measured 1.9 cm. 5 mm lateral to this the second mass measured 1.2 cm maximally. Taken together the 2 masses measured 3.3 cm maximally.  Biopsy of the 2 masses in question 06/13/2015 showed ([id number]) identical invasive ductal carcinomas, estrogen receptor 100% positive, progesterone receptor 100% positive, both with strong staining intensity, with an MIB-1 of 5%, and no HER-2 amplification, the signals ratio being 1.15 and the number per cell 1.95.  The patient's subsequent history is as detailed below  INTERVAL HISTORY: Kenzlee returns today for follow-up of her estrogen receptor positive breast cancers. She is doing well today.  She is taking Anastrozole daily  and is tolerating it well.  She continues to undergo her annual mammograms.  She sees her PCP and her cancer screenings are up to date.    REVIEW OF SYSTEMS: Kelseigh is doing well today.  She has no new headaches, shortness of breath, chest pain, palpitations, or any other concerns.  A detailed ROS is non contributory.    PAST MEDICAL HISTORY: Past Medical History:  Diagnosis Date  . Anemia   . Breast cancer ([place]) 08/28/2015   Bilateral  . Breast cancer of upper-outer quadrant of right female breast ([place]) 06/23/2015  . High cholesterol   . Hx of radiation therapy 11/05/15- 12/16/15   Right Breast and Left Breast  . Hypertension   . Personal history of radiation therapy    Right Breast Cancer    PAST SURGICAL HISTORY: Past Surgical History:  Procedure Laterality Date  . BREAST LUMPECTOMY Left 08/28/2015  . BREAST LUMPECTOMY Right 08/28/2015  . BREAST LUMPECTOMY WITH NEEDLE LOCALIZATION AND AXILLARY SENTINEL LYMPH NODE BX Bilateral 08/28/2015   Procedure: BILATERAL BREAST LUMPECTOMY WITH BILATERAL  NEEDLE LOCALIZATION AND BILATERAL AXILLARY SENTINEL LYMPH NODE BX;  Surgeon: Erroll Luna, MD;  Location: [place];  Service: General;  Laterality: Bilateral;    FAMILY HISTORY No family history on file. The patient's parents died when she was very young. She thinks her father may have died from "high blood pressure", and her mother from cancer, possibly of the uterus. He is alive 3 brothers, 2 sisters. One brother died with lung cancer. He had a history of tobacco abuse. One other brother and one sister have died from complications of diabetes  GYNECOLOGIC HISTORY:  No LMP recorded. Patient has had a hysterectomy. Menarche age 25, first  live birth age 27, the patient is GX P2. She does not recall exactly when she stopped having periods. She never took hormone replacement. She never used oral contraceptives.  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 At  home she lives with her daughter Shannon Schiffman myeloma who works in financial aid, and son Jasper Melena, who is unemployed and has a history of schizophrenia.   ADVANCED DIRECTIVES: Not in place. At the initial clinic visit the patient was given the appropriate forms to complete and notarize at her discretion.  HEALTH MAINTENANCE: Social History   Tobacco Use  . Smoking status: Never Smoker  Substance Use Topics  . Alcohol use: No  . Drug use: No     Colonoscopy: never  PAP:  Bone density: remote  Lipid panel:  Allergies  Allergen Reactions  . Aspirin Nausea Only  . Onion Nausea And Vomiting    White onion  . Hydrocodone Rash    With itching    Current Outpatient Medications  Medication Sig Dispense Refill  . anastrozole (ARIMIDEX) 1 MG tablet Take 1 tablet (1 mg total) by mouth daily. 90 tablet 4  . Ferrous Sulfate (IRON) 325 (65 Fe) MG TABS Take by mouth daily.    . losartan-hydrochlorothiazide (HYZAAR) 100-12.5 MG tablet Take 1 tablet by mouth daily.    . traZODone (DESYREL) 50 MG tablet      No current facility-administered medications for this visit.     OBJECTIVE:  Vitals:   09/30/17 1041  BP: (!) 141/77  Pulse: 68  Resp: 19  Temp: 98.7 F (37.1 C)  SpO2: 100%     Body mass index is 27.94 kg/m.    ECOG FS:0 - Asymptomatic GENERAL: Patient is a well appearing female in no acute distress HEENT:  Sclerae anicteric.  Oropharynx clear and moist. No ulcerations or evidence of oropharyngeal candidiasis. Neck is supple.  NODES:  No cervical, supraclavicular, or axillary lymphadenopathy palpated.  BREAST EXAM:  S/p bilateral lumpectomies, no nodularity, no nodules, no masses, no other skin changes. LUNGS:  Clear to auscultation bilaterally.  No wheezes or rhonchi. HEART:  Regular rate and rhythm. No murmur appreciated. ABDOMEN:  Soft, nontender.  Positive, normoactive bowel sounds. No organomegaly palpated. MSK:  No focal spinal tenderness to palpation. Full  range of motion bilaterally in the upper extremities. EXTREMITIES:  No peripheral edema.   SKIN:  Clear with no obvious rashes or skin changes. No nail dyscrasia. NEURO:  Nonfocal. Well oriented.  Appropriate affect.     LAB RESULTS:  CMP     Component Value Date/Time   NA 142 09/30/2017 0938   NA 143 09/23/2016 1426   K 4.0 09/30/2017 0938   K 4.5 09/23/2016 1426   CL 106 09/30/2017 0938   CO2 28 09/30/2017 0938   CO2 29 09/23/2016 1426   GLUCOSE 98 09/30/2017 0938   GLUCOSE 95 09/23/2016 1426   BUN 16 09/30/2017 0938   BUN 21.8 09/23/2016 1426   CREATININE 1.01 09/30/2017 0938   CREATININE 1.1 09/23/2016 1426   CALCIUM 9.7 09/30/2017 0938   CALCIUM 10.0 09/23/2016 1426   PROT 7.5 09/30/2017 0938   PROT 7.4 09/23/2016 1426   ALBUMIN 4.0 09/30/2017 0938   ALBUMIN 4.0 09/23/2016 1426   AST 20 09/30/2017 0938   AST 23 09/23/2016 1426   ALT 16 09/30/2017 0938   ALT 19 09/23/2016 1426   ALKPHOS 128 09/30/2017 0938   ALKPHOS 151 (H) 09/23/2016   1426   BILITOT 0.6 09/30/2017 0938   BILITOT 0.58 09/23/2016 1426   GFRNONAA 55 (L) 09/30/2017 0938   GFRAA >60 09/30/2017 0938    INo results found for: SPEP, UPEP  Lab Results  Component Value Date   WBC 3.1 (L) 09/30/2017   NEUTROABS 1.7 09/30/2017   HGB 10.5 (L) 09/23/2016   HCT 33.3 (L) 09/30/2017   MCV 83.1 09/30/2017   PLT 216 09/30/2017      Chemistry      Component Value Date/Time   NA 142 09/30/2017 0938   NA 143 09/23/2016 1426   K 4.0 09/30/2017 0938   K 4.5 09/23/2016 1426   CL 106 09/30/2017 0938   CO2 28 09/30/2017 0938   CO2 29 09/23/2016 1426   BUN 16 09/30/2017 0938   BUN 21.8 09/23/2016 1426   CREATININE 1.01 09/30/2017 0938   CREATININE 1.1 09/23/2016 1426      Component Value Date/Time   CALCIUM 9.7 09/30/2017 0938   CALCIUM 10.0 09/23/2016 1426   ALKPHOS 128 09/30/2017 0938   ALKPHOS 151 (H) 09/23/2016 1426   AST 20 09/30/2017 0938   AST 23 09/23/2016 1426   ALT 16 09/30/2017 0938    ALT 19 09/23/2016 1426   BILITOT 0.6 09/30/2017 0938   BILITOT 0.58 09/23/2016 1426       No results found for: LABCA2  No components found for: LABCA125  No results for input(s): INR in the last 168 hours.  Urinalysis    Component Value Date/Time   COLORURINE yellow 04/01/2010 0000   APPEARANCEUR Clear 04/01/2010 0000   LABSPEC 1.010 04/01/2010 0000   PHURINE 7.5 04/01/2010 0000   HGBUR negative 04/01/2010 0000   BILIRUBINUR negative 04/01/2010 0000   UROBILINOGEN 0.2 04/01/2010 0000   NITRITE negative 04/01/2010 0000    STUDIES:    ASSESSMENT: 71 y.o. [place] woman status post right breastUpper outer quadrant biopsy 2 on 06/13/2015 for morphologically identical invasive ductal carcinomas measuring 1.9 and 1.2 cm, grade 2, estrogen and progesterone receptor strongly positive, HER-2 not amplified, with an MIB-1 of 5%  (1) bilateral lumpectomies 08/28/2015 showed  (a) on the right, apT2 pN0,stage IIA invasive ductal carcinoma, grade 1, with repeat HER-2 again negative  (b) on the left, apT1c pN0, stage IA invasive ductal carcinoma, grade 1, with repeat HER-2 again negative  (2) Oncotype score of 5 predicts a risk of outside the breast recurrence within 10 years of 5% if the patient's only systemic therapy is tamoxifen for 5 years. It also predicts no significant benefit from chemotherapy   (3) adjuvant radiation completed 12/15/2015  (4) anastrozole started 12/29/2015  (a) Bone density 05/12/2016 shows a T score of -1.7  PLAN:  Dallie is doing well today.  She is taking Anastrozole daily and is tolerating it well.  She will continue this.  She will be due for repeat mammogram in 08/2018 and repeat bone density in 04/2018.  I have placed orders for both of these to occur.  I counseled her in detail about healthy diet and exercise.    She will return in one year for labs, follow up with Dr. Magrinat.    She knows to call for any other issues that may develop before her  next visit.  Lindsey C Causey, NP   09/30/2017 4:58 PM Medical Oncology and Hematology  [facility] [address] [place], [place] [address] Tel. [phone]    Fax. [phone] 

## 2017-09-30 ENCOUNTER — Encounter: Payer: Self-pay | Admitting: Adult Health

## 2017-09-30 ENCOUNTER — Telehealth: Payer: Self-pay | Admitting: Adult Health

## 2017-09-30 ENCOUNTER — Other Ambulatory Visit: Payer: Self-pay | Admitting: Adult Health

## 2017-09-30 ENCOUNTER — Inpatient Hospital Stay: Payer: Medicare Other | Attending: Oncology | Admitting: Adult Health

## 2017-09-30 ENCOUNTER — Inpatient Hospital Stay: Payer: Medicare Other

## 2017-09-30 VITALS — BP 141/77 | HR 68 | Temp 98.7°F | Resp 19 | Ht 66.0 in | Wt 173.1 lb

## 2017-09-30 DIAGNOSIS — Z79899 Other long term (current) drug therapy: Secondary | ICD-10-CM | POA: Diagnosis not present

## 2017-09-30 DIAGNOSIS — Z170 Estrogen receptor positive status [ER+]: Secondary | ICD-10-CM | POA: Insufficient documentation

## 2017-09-30 DIAGNOSIS — Z833 Family history of diabetes mellitus: Secondary | ICD-10-CM | POA: Insufficient documentation

## 2017-09-30 DIAGNOSIS — C50411 Malignant neoplasm of upper-outer quadrant of right female breast: Secondary | ICD-10-CM

## 2017-09-30 DIAGNOSIS — Z79811 Long term (current) use of aromatase inhibitors: Secondary | ICD-10-CM | POA: Diagnosis not present

## 2017-09-30 DIAGNOSIS — E2839 Other primary ovarian failure: Secondary | ICD-10-CM

## 2017-09-30 DIAGNOSIS — Z90710 Acquired absence of both cervix and uterus: Secondary | ICD-10-CM | POA: Insufficient documentation

## 2017-09-30 LAB — CMP (CANCER CENTER ONLY)
ALK PHOS: 128 U/L (ref 40–150)
ALT: 16 U/L (ref 0–55)
AST: 20 U/L (ref 5–34)
Albumin: 4 g/dL (ref 3.5–5.0)
Anion gap: 8 (ref 3–11)
BILIRUBIN TOTAL: 0.6 mg/dL (ref 0.2–1.2)
BUN: 16 mg/dL (ref 7–26)
CALCIUM: 9.7 mg/dL (ref 8.4–10.4)
CO2: 28 mmol/L (ref 22–29)
CREATININE: 1.01 mg/dL (ref 0.60–1.10)
Chloride: 106 mmol/L (ref 98–109)
GFR, Est AFR Am: >60 mL/min (ref >60)
GFR, Estimated: 55 mL/min — ABNORMAL LOW (ref >60)
Glucose, Bld: 98 mg/dL (ref 70–140)
Potassium: 4 mmol/L (ref 3.5–5.1)
SODIUM: 142 mmol/L (ref 136–145)
TOTAL PROTEIN: 7.5 g/dL (ref 6.4–8.3)

## 2017-09-30 LAB — CBC WITH DIFFERENTIAL (CANCER CENTER ONLY)
BASOS ABS: 0 10*3/uL (ref 0.0–0.1)
BASOS PCT: 1 %
EOS PCT: 3 %
Eosinophils Absolute: 0.1 10*3/uL (ref 0.0–0.5)
HCT: 33.3 % — ABNORMAL LOW (ref 34.8–46.6)
Hemoglobin: 10.9 g/dL — ABNORMAL LOW (ref 11.6–15.9)
Lymphocytes Relative: 30 %
Lymphs Abs: 1 10*3/uL (ref 0.9–3.3)
MCH: 27.3 pg (ref 25.1–34.0)
MCHC: 32.8 g/dL (ref 31.5–36.0)
MCV: 83.1 fL (ref 79.5–101.0)
MONO ABS: 0.3 10*3/uL (ref 0.1–0.9)
Monocytes Relative: 10 %
Neutro Abs: 1.7 10*3/uL (ref 1.5–6.5)
Neutrophils Relative %: 56 %
PLATELETS: 216 10*3/uL (ref 145–400)
RBC: 4.01 MIL/uL (ref 3.70–5.45)
RDW: 13 % (ref 11.2–14.5)
WBC Count: 3.1 10*3/uL — ABNORMAL LOW (ref 3.9–10.3)

## 2017-09-30 NOTE — Telephone Encounter (Signed)
Gave patient AVs and calendar of upcoming February 2020 appointments. °

## 2017-10-04 MED FILL — ANASTROZOLE 1 MG TABLET: 1 | 30 days supply | Qty: 30 | Fill #2

## 2017-10-25 ENCOUNTER — Ambulatory Visit (HOSPITAL_COMMUNITY)
Admission: EM | Admit: 2017-10-25 | Discharge: 2017-10-25 | Disposition: A | Discharge status: Home / Self Care | Payer: Medicare Other | Attending: Family Medicine | Admitting: Family Medicine

## 2017-10-25 ENCOUNTER — Encounter (HOSPITAL_COMMUNITY): Payer: Self-pay | Admitting: Emergency Medicine

## 2017-10-25 DIAGNOSIS — R55 Syncope and collapse: Secondary | ICD-10-CM | POA: Diagnosis not present

## 2017-10-25 DIAGNOSIS — N390 Urinary tract infection, site not specified: Secondary | ICD-10-CM | POA: Diagnosis not present

## 2017-10-25 DIAGNOSIS — I10 Essential (primary) hypertension: Secondary | ICD-10-CM | POA: Insufficient documentation

## 2017-10-25 DIAGNOSIS — R42 Dizziness and giddiness: Secondary | ICD-10-CM | POA: Diagnosis not present

## 2017-10-25 DIAGNOSIS — Z885 Allergy status to narcotic agent status: Secondary | ICD-10-CM | POA: Diagnosis not present

## 2017-10-25 DIAGNOSIS — Z853 Personal history of malignant neoplasm of breast: Secondary | ICD-10-CM | POA: Insufficient documentation

## 2017-10-25 DIAGNOSIS — Z886 Allergy status to analgesic agent status: Secondary | ICD-10-CM | POA: Insufficient documentation

## 2017-10-25 LAB — POCT I-STAT, CHEM 8
BUN: 21 mg/dL — ABNORMAL HIGH (ref 6–20)
CALCIUM ION: 1.2 mmol/L (ref 1.15–1.40)
Chloride: 102 mmol/L (ref 101–111)
Creatinine, Ser: 1 mg/dL (ref 0.44–1.00)
GLUCOSE: 96 mg/dL (ref 65–99)
HCT: 34 % — ABNORMAL LOW (ref 36.0–46.0)
Hemoglobin: 11.6 g/dL — ABNORMAL LOW (ref 12.0–15.0)
Potassium: 3.8 mmol/L (ref 3.5–5.1)
SODIUM: 140 mmol/L (ref 135–145)
TCO2: 29 mmol/L (ref 22–32)

## 2017-10-25 LAB — POCT URINALYSIS DIP (DEVICE)
Bilirubin Urine: NEGATIVE
GLUCOSE, UA: NEGATIVE mg/dL
Ketones, ur: NEGATIVE mg/dL
NITRITE: NEGATIVE
PROTEIN: NEGATIVE mg/dL
Specific Gravity, Urine: 1.01 (ref 1.005–1.030)
UROBILINOGEN UA: 0.2 mg/dL (ref 0.0–1.0)
pH: 6.5 (ref 5.0–8.0)

## 2017-10-25 MED ORDER — CEPHALEXIN 500 MG PO CAPS: 500.0000 mg | ORAL_CAPSULE | Freq: Four times a day (QID) | ORAL | 0 refills | Status: AC

## 2017-10-25 NOTE — ED Provider Notes (Signed)
[facility]    CSN: [id number] Arrival date & time: 10/25/17  1923     History   Chief Complaint Chief Complaint  Patient presents with  . Dizziness    HPI Christina [name] is a 72 y.o. female.   Christina [name] presents with family with complaints of two falls today due to loss of balance. She states this morning she was bent over and stood up and and felt light headed so she leaned back onto her hands and knees. She did not pass out or lose consciousness. She did not hit her head. She states she then stood up independently and felt normal throughout the day. Tonight during her walk outside she felt herself lose her balance causing her to fall onto the side walk, catching herself on a pole. She did not lose consciousness or strike her head. Small abrasion to left knee. She stood herself up and ambulated back to her work place and called her daughter. This occurred at approximately 1830. She has since been ambulatory. Denies any dizziness, lightheadedness, headache, shortness of breath , palpitations, chest pain, nausea, vomiting or diarrhea. She states her bp medication does cause this for her at times. She has not drank any water today, did drink coffee earlier today and that has been it. History of htn, breast cancer, anemia.    ROS per HPI.       Past Medical History:  Diagnosis Date  . Anemia   . Breast cancer ([place]) 08/28/2015   Bilateral  . Breast cancer of upper-outer quadrant of right female breast ([place]) 06/23/2015  . High cholesterol   . Hx of radiation therapy 11/05/15- 12/16/15   Right Breast and Left Breast  . Hypertension   . Personal history of radiation therapy    Right Breast Cancer    Patient Active Problem List   Diagnosis Date Noted  . Malignant neoplasm of lower-inner quadrant of left breast in female, estrogen receptor positive ([place]) 10/20/2015  . Malignant neoplasm of upper-outer quadrant of right breast in female, estrogen receptor positive ([place])  06/23/2015  . CONSTIPATION 04/01/2010  . PALPITATIONS 04/01/2010  . URINALYSIS, ABNORMAL 04/01/2010  . ANEMIA 05/22/2009  . DENTAL CARIES 04/21/2009  . HYPERLIPIDEMIA 03/18/2008  . HYPERTENSION 08/23/2006    Past Surgical History:  Procedure Laterality Date  . BREAST LUMPECTOMY Left 08/28/2015  . BREAST LUMPECTOMY Right 08/28/2015  . BREAST LUMPECTOMY WITH NEEDLE LOCALIZATION AND AXILLARY SENTINEL LYMPH NODE BX Bilateral 08/28/2015   Procedure: BILATERAL BREAST LUMPECTOMY WITH BILATERAL  NEEDLE LOCALIZATION AND BILATERAL AXILLARY SENTINEL LYMPH NODE BX;  Surgeon: Erroll Luna, MD;  Location: [facility];  Service: General;  Laterality: Bilateral;    OB History    No data available       Home Medications    Prior to Admission medications   Medication Sig Start Date End Date Taking? Authorizing Provider  anastrozole (ARIMIDEX) 1 MG tablet Take 1 tablet (1 mg total) by mouth daily. 04/05/17  Yes Magrinat, Virgie Dad, MD  Ferrous Sulfate (IRON) 325 (65 Fe) MG TABS Take by mouth daily.   Yes [provider]  losartan-hydrochlorothiazide (HYZAAR) 50-12.5 MG tablet Take 1 tablet by mouth daily.   Yes [provider]  traZODone (DESYREL) 50 MG tablet  09/29/17  Yes [provider]  cephALEXin (KEFLEX) 500 MG capsule Take 1 capsule (500 mg total) by mouth 4 (four) times daily for 7 days. 10/25/17 11/01/17  Zigmund Gottron, NP    Family History No family  history on file.  Social History Social History   Tobacco Use  . Smoking status: Never Smoker  Substance Use Topics  . Alcohol use: No  . Drug use: No     Allergies   Aspirin; Onion; and Hydrocodone   Review of Systems Review of Systems   Physical Exam Triage Vital Signs ED Triage Vitals [10/25/17 1957]  Enc Vitals Group     BP (!) 151/90     Pulse Rate 71     Resp 16     Temp 98.2 F (36.8 C)     Temp Source Oral     SpO2 97 %     Weight 173 lb (78.5 kg)     Height      Head Circumference       Peak Flow      Pain Score 0     Pain Loc      Pain Edu?      Excl. in [place]?    No data found.  Updated Vital Signs BP (!) 151/90   Pulse 71   Temp 98.2 F (36.8 C) (Oral)   Resp 16   Wt 173 lb (78.5 kg)   SpO2 97%   BMI 27.92 kg/m   Visual Acuity Right Eye Distance:   Left Eye Distance:   Bilateral Distance:    Right Eye Near:   Left Eye Near:    Bilateral Near:     Physical Exam  Constitutional: She is oriented to person, place, and time. She appears well-developed and well-nourished. No distress.  HENT:  Head: Normocephalic and atraumatic.  Eyes: EOM are normal. Pupils are equal, round, and reactive to light.  Neck: Normal range of motion.  Cardiovascular: Normal rate, regular rhythm and normal heart sounds.  Pulmonary/Chest: Effort normal and breath sounds normal.  Musculoskeletal: Normal range of motion. She exhibits no tenderness.  Neurological: She is alert and oriented to person, place, and time. She has normal strength. No cranial nerve deficit or sensory deficit. She displays a negative Romberg sign. GCS eye subscore is 4. GCS verbal subscore is 5. GCS motor subscore is 6.  Ambulatory in hallway with steady gait  Skin: Skin is warm and dry.   EKG NSR with rate of 76, no acute changes.   UC Treatments / Results  Labs (all labs ordered are listed, but only abnormal results are displayed) Labs Reviewed  POCT I-STAT, CHEM 8 - Abnormal; Notable for the following components:      Result Value   BUN 21 (*)    Hemoglobin 11.6 (*)    HCT 34.0 (*)    All other components within normal limits  POCT URINALYSIS DIP (DEVICE) - Abnormal; Notable for the following components:   Hgb urine dipstick TRACE (*)    Leukocytes, UA SMALL (*)    All other components within normal limits  URINE CULTURE    EKG  EKG Interpretation None       Radiology No results found.  Procedures Procedures (including critical care time)  Medications Ordered in UC Medications -  No data to display   Initial Impression / Assessment and Plan / UC Course  I have reviewed the triage vital signs and the nursing notes.  Pertinent labs & imaging results that were available during my care of the patient were reviewed by me and considered in my medical decision making (see chart for details).     Patient alert, oriented, without current complaints. Without neurological findings on exam.  Ambulatory without difficulty. Chem 8 without acute findings. H&h improved from 2/8, history of anemia and takes iron.  Urine with leukocytes and blood, concerning for UTI. Culture pending. Will start keflex at this time. Increase water intake. Return precautions provided. Patient verbalized understanding and agreeable to plan.  Ambulatory out of clinic without difficulty.    Final Clinical Impressions(s) / UC Diagnoses   Final diagnoses:  Near syncope  Urinary tract infection without hematuria, site unspecified    ED Discharge Orders        Ordered    cephALEXin (KEFLEX) 500 MG capsule  4 times daily     10/25/17 2028       Controlled Substance Prescriptions [place] Controlled Substance Registry consulted? Not Applicable   Zigmund Gottron, NP 10/25/17 2031

## 2017-10-25 NOTE — ED Triage Notes (Signed)
PT reports she was straightening up after being bent down and had an episode of lightheadedness. PT fell to her knees because of it. PT did not hit head, no LOC. PT reports her BP/ water pill often makes her feel this way when she's walkign.

## 2017-10-25 NOTE — Discharge Instructions (Signed)
Your urine is concerning for infection which can cause light headedness. I have sent your urine to be cultured to ensure infection is present. Please start antibiotics. Please make an appointment with your primary care provider for a recheck of symptoms in the next week. If you develop worsening of dizziness, lightheadedness, chest pain, weakness, confusion, disorientation, facial droop or otherwise worsening please go to the Er.  Please increase your water intake.

## 2017-10-27 LAB — URINE CULTURE: Culture: <10000 — AB

## 2017-11-04 MED FILL — LOSARTAN-HCTZ 50-12.5 MG TA: 50-12.5 | 30 days supply | Qty: 30 | Fill #1

## 2017-11-04 MED FILL — ANASTROZOLE 1 MG TABLET: 1 | 30 days supply | Qty: 30 | Fill #3

## 2017-11-10 MED FILL — LOSARTAN POTASSIUM 50 MG TA: 50 | 30 days supply | Qty: 30 | Fill #0

## 2017-11-11 IMAGING — DX DG KNEE 1-2V*L*
2 series · 2 of 2 positions shown · non-contrast
Comparison: None.

CLINICAL DATA: Knee pain, no known injury, initial encounter

EXAM:
LEFT KNEE - 1-2 VIEW

[knee ap]
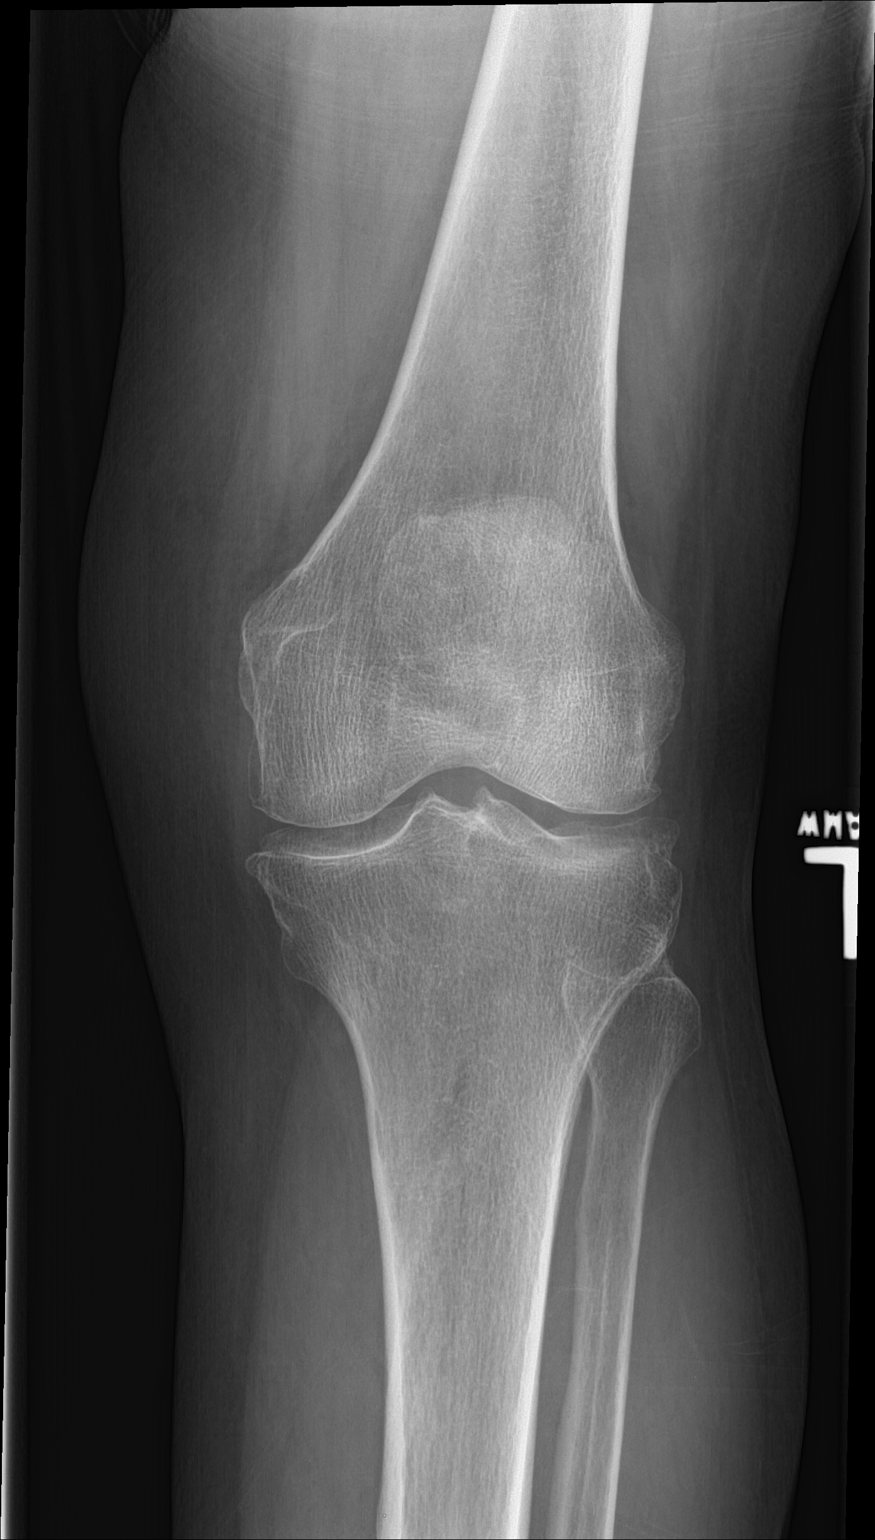

[knee lat]
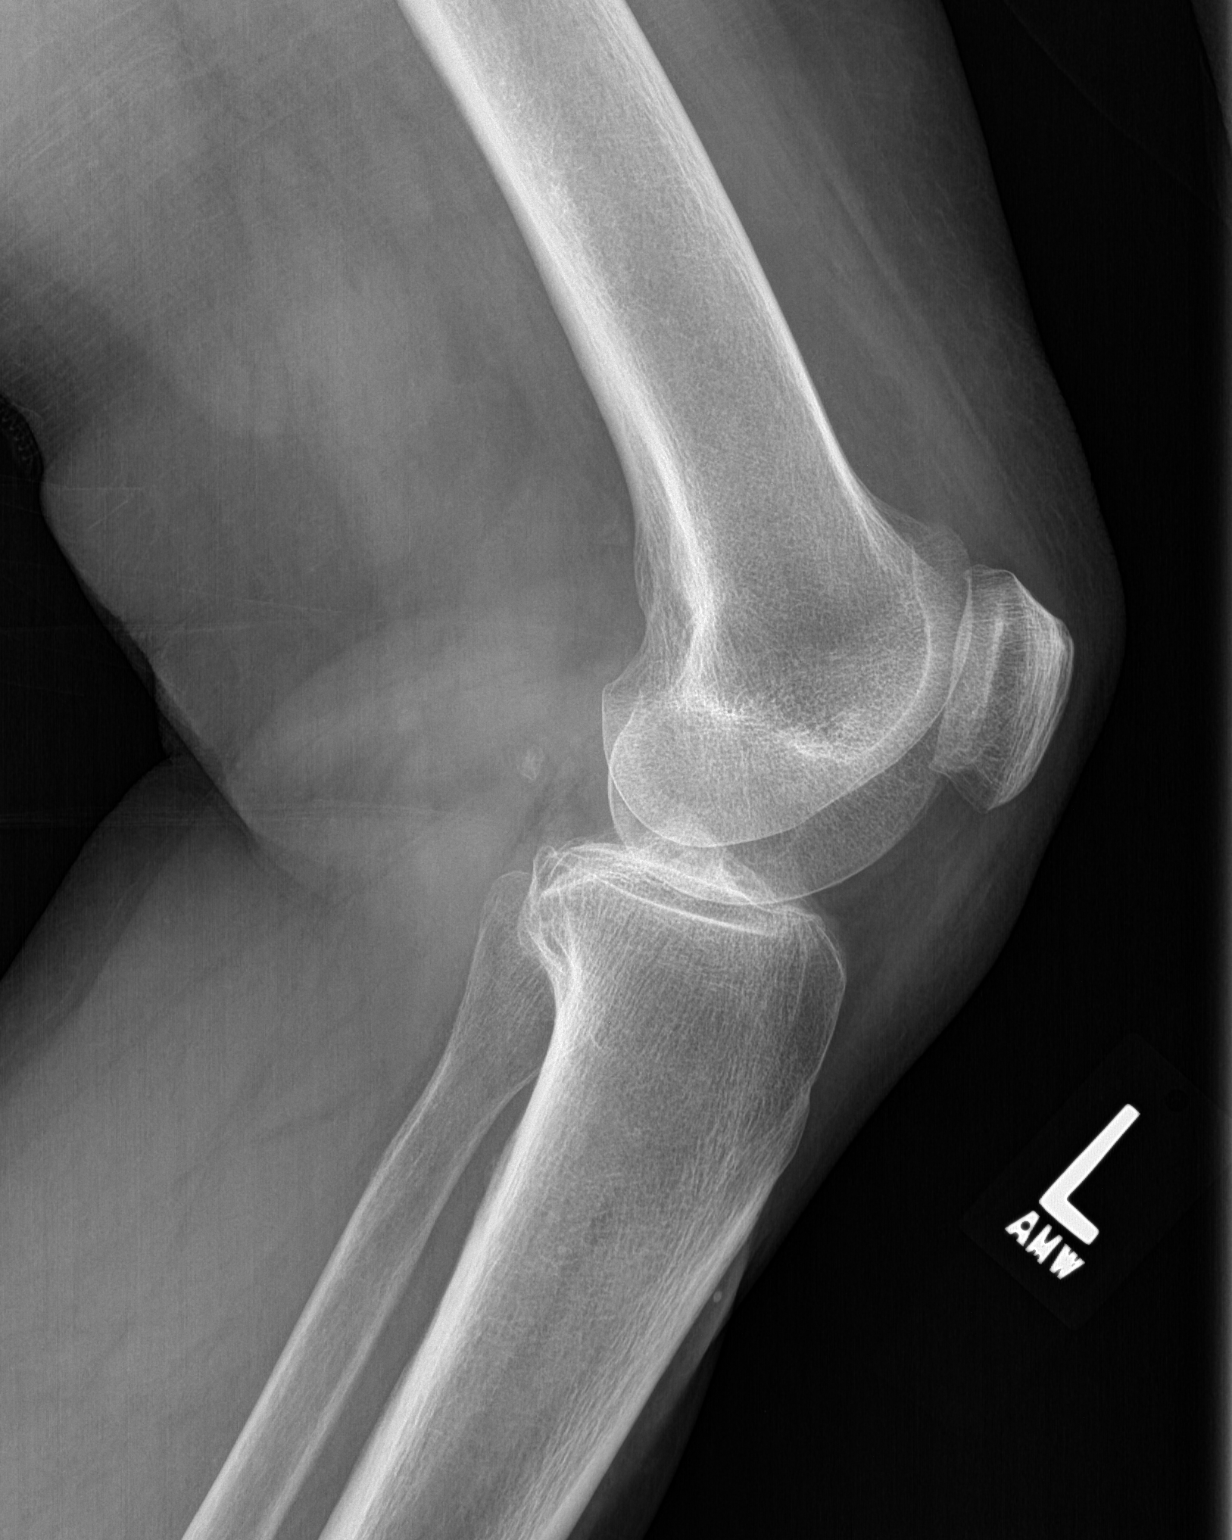

[2 of 2 positions shown; findings below may reference images not displayed]

FINDINGS: Mild degenerative changes are noted. No acute fracture dislocation
is seen. No soft tissue abnormality is noted.
IMPRESSION: Mild degenerative change without acute abnormality.

## 2017-12-02 MED FILL — ANASTROZOLE 1 MG TABLET: 1 | 30 days supply | Qty: 30 | Fill #4

## 2017-12-04 MED FILL — LOSARTAN POTASSIUM 50 MG TA: 50 | 30 days supply | Qty: 30 | Fill #1

## 2017-12-11 ENCOUNTER — Encounter (HOSPITAL_COMMUNITY): Payer: Self-pay | Admitting: Emergency Medicine

## 2017-12-11 ENCOUNTER — Ambulatory Visit (HOSPITAL_COMMUNITY)
Admission: EM | Admit: 2017-12-11 | Discharge: 2017-12-11 | Disposition: A | Discharge status: Home / Self Care | Payer: Medicare Other | Attending: Physician Assistant | Admitting: Physician Assistant

## 2017-12-11 DIAGNOSIS — R05 Cough: Secondary | ICD-10-CM | POA: Diagnosis not present

## 2017-12-11 DIAGNOSIS — R058 Other specified cough: Secondary | ICD-10-CM

## 2017-12-11 MED ORDER — FEXOFENADINE HCL 60 MG PO TABS: 60.0000 mg | ORAL_TABLET | Freq: Two times a day (BID) | ORAL | 0 refills | Status: DC

## 2017-12-11 NOTE — Discharge Instructions (Signed)
Lets try some allergy medication for your symptoms.  You have a great exam today.

## 2017-12-11 NOTE — ED Provider Notes (Signed)
12/11/2017 8:11 PM   DOB: August 27, 1945 / MRN: [id number]  SUBJECTIVE:  Christina [name] is a 72 y.o. female presenting for waxing and waning productive cough for the last month.  Associates sneezing and eye watering.  Has tried mucinex with no relief.   She is allergic to aspirin; onion; and hydrocodone.   She  has a past medical history of Anemia, Breast cancer ([place]) (08/28/2015), Breast cancer of upper-outer quadrant of right female breast ([place]) (06/23/2015), High cholesterol, radiation therapy (11/05/15- 12/16/15), Hypertension, and Personal history of radiation therapy.    She  reports that she has never smoked. She does not have any smokeless tobacco history on file. She reports that she does not drink alcohol or use drugs. She  reports that she does not engage in sexual activity. The patient  has a past surgical history that includes Breast lumpectomy with needle localization and axillary sentinel lymph node bx (Bilateral, 08/28/2015); Breast lumpectomy (Left, 08/28/2015); and Breast lumpectomy (Right, 08/28/2015).  Her family history is not on file.  Review of Systems  Respiratory: Positive for cough and sputum production. Negative for hemoptysis and shortness of breath.   Cardiovascular: Negative for chest pain and leg swelling.  Gastrointestinal: Negative for abdominal pain, nausea and vomiting.    OBJECTIVE:  BP (!) 152/81 (BP Location: Right Arm)   Pulse 81   Temp 98.7 F (37.1 C) (Oral)   Resp 16   SpO2 98%   Physical Exam  Constitutional: She is oriented to person, place, and time.  HENT:  Right Ear: External ear normal.  Left Ear: External ear normal.  Nose: Mucosal edema present. Right sinus exhibits no maxillary sinus tenderness and no frontal sinus tenderness. Left sinus exhibits no maxillary sinus tenderness and no frontal sinus tenderness.  Mouth/Throat: Oropharynx is clear and moist. No oropharyngeal exudate.  Eyes: Pupils are equal, round, and reactive to light.  Conjunctivae are normal.  Cardiovascular: Regular rhythm, S1 normal, S2 normal, normal heart sounds and intact distal pulses. Exam reveals no gallop, no friction rub and no decreased pulses.  No murmur heard. Pulmonary/Chest: Effort normal and breath sounds normal. No stridor. No respiratory distress. She has no wheezes. She has no rales.  Abdominal: She exhibits no distension.  Musculoskeletal: She exhibits no edema.  Neurological: She is alert and oriented to person, place, and time.  Skin: Skin is warm and dry. No rash noted. She is not diaphoretic. No erythema.  Psychiatric: Her behavior is normal.    No results found for this or any previous visit (from the past 72 hour(s)).  No results found.  ASSESSMENT AND PLAN:  No orders of the defined types were placed in this encounter.    Allergic cough: Perfect exam aside from some nasal turbinate hypertrophy.  Advised allegra.       The patient is advised to call or return to clinic if she does not see an improvement in symptoms, or to seek the care of the closest emergency department if she worsens with the above plan.   Philis Fendt, MHS, PA-C 12/11/2017 8:11 PM    Tereasa Coop, PA-C 12/11/17 2011

## 2017-12-11 NOTE — ED Triage Notes (Signed)
Pt c/o cough x 1 month

## 2018-01-05 MED FILL — ANASTROZOLE 1 MG TABLET: 1 | 30 days supply | Qty: 30 | Fill #5

## 2018-01-05 MED FILL — LOSARTAN POTASSIUM 50 MG TA: 50 | 30 days supply | Qty: 30 | Fill #2

## 2018-01-09 ENCOUNTER — Other Ambulatory Visit: Payer: Self-pay | Admitting: Physician Assistant

## 2018-01-30 MED FILL — LOSARTAN-HCTZ 50-12.5 MG TA: 50-12.5 | 30 days supply | Qty: 30 | Fill #0

## 2018-02-02 MED FILL — ANASTROZOLE 1 MG TABLET: 1 | 30 days supply | Qty: 30 | Fill #6

## 2018-02-24 MED FILL — LOSARTAN-HCTZ 50-12.5 MG TA: 50-12.5 | 30 days supply | Qty: 30 | Fill #1

## 2018-02-26 MED FILL — ANASTROZOLE 1 MG TABLET: 1 | 30 days supply | Qty: 30 | Fill #7

## 2018-03-23 ENCOUNTER — Ambulatory Visit (INDEPENDENT_AMBULATORY_CARE_PROVIDER_SITE_OTHER): Payer: Medicare Other | Admitting: Family Medicine

## 2018-03-23 ENCOUNTER — Encounter: Payer: Self-pay | Admitting: Family Medicine

## 2018-03-23 VITALS — BP 124/80 | HR 74 | Ht 65.5 in | Wt 171.8 lb

## 2018-03-23 DIAGNOSIS — M79672 Pain in left foot: Secondary | ICD-10-CM | POA: Diagnosis not present

## 2018-03-23 DIAGNOSIS — I10 Essential (primary) hypertension: Secondary | ICD-10-CM

## 2018-03-23 DIAGNOSIS — Z7689 Persons encountering health services in other specified circumstances: Secondary | ICD-10-CM | POA: Diagnosis not present

## 2018-03-23 NOTE — Progress Notes (Signed)
Subjective:    Patient ID: Christina [name], female    DOB: [birth year], 72 y.o.   MRN: [id number]  HPI Chief Complaint  Patient presents with  . new pt    new pt, blood pressure issues.    She is new to the practice and here to establish care.  Her daughter is with her today. Previous medical care: [facility] for the past 5 years. Last seen there May or June of this year.   Reports having issues with her BP in the past.   Other providers: [facility] for annual check ups for breast cancer history.  Cardiologist in the past and had a negative work up.   Anemia- is on iron   Hyperlipidemia- is not on a statin but has been in the past.   Complains of left foot pain for the past month after her foot was "squeezed" by a lift on a bus. States she is walking on it fine but it is sore at times.  Taking ibuprofen as needed.   Trazodone in the past for sleep. Does not take this regularly.   Denies fever, chills, dizziness, chest pain, palpitations, shortness of breath, abdominal pain, N/V/D, urinary symptoms, LE edema.    Past Medical History:  Diagnosis Date  . Anemia   . Breast cancer ([place]) 08/28/2015   Bilateral  . Breast cancer of upper-outer quadrant of right female breast ([place]) 06/23/2015  . High cholesterol   . Hx of radiation therapy 11/05/15- 12/16/15   Right Breast and Left Breast  . Hypertension   . Personal history of radiation therapy    Right Breast Cancer   Past Surgical History:  Procedure Laterality Date  . BREAST LUMPECTOMY Left 08/28/2015  . BREAST LUMPECTOMY Right 08/28/2015  . BREAST LUMPECTOMY WITH NEEDLE LOCALIZATION AND AXILLARY SENTINEL LYMPH NODE BX Bilateral 08/28/2015   Procedure: BILATERAL BREAST LUMPECTOMY WITH BILATERAL  NEEDLE LOCALIZATION AND BILATERAL AXILLARY SENTINEL LYMPH NODE BX;  Surgeon: Erroll Luna, MD;  Location: [place] [facility];  Service: General;  Laterality: Bilateral;   Current Outpatient Medications on File Prior to Visit    Medication Sig Dispense Refill  . anastrozole (ARIMIDEX) 1 MG tablet Take 1 tablet (1 mg total) by mouth daily. 90 tablet 4  . Ferrous Sulfate (FEROSUL PO) Take by mouth.    . fexofenadine (ALLEGRA) 60 MG tablet Take 1 tablet (60 mg total) by mouth 2 (two) times daily. 60 tablet 0  . losartan-hydrochlorothiazide (HYZAAR) 50-12.5 MG tablet Take 1 tablet by mouth daily.    . traZODone (DESYREL) 50 MG tablet     . triamcinolone cream (KENALOG) 0.1 % Apply 1 application topically 2 (two) times daily.     No current facility-administered medications on file prior to visit.       Review of Systems Pertinent positives and negatives in the history of present illness.     Objective:   Physical Exam BP 124/80   Pulse 74   Ht 5' 5.5" (1.664 m)   Wt 171 lb 12.8 oz (77.9 kg)   BMI 28.15 kg/m   Alert and oriented and in no acute distress.  Cardiac exam shows regular rate and rhythm, no murmurs, rubs or gallops.  Lungs are clear to auscultation.  Extremities without edema, pulses intact.  Left ankle and foot with normal sensation and motor function.  Skin is warm and dry, no pallor or rash.       Assessment & Plan:  Essential hypertension  Foot  pain, left  Encounter to establish care  Discussed that her blood pressure is in goal range today.  Recommend she continue on her current medications.  She appears to be doing well on these. Discussed diet and exercise to help control blood pressure. Discussed that left foot exam is unremarkable and recommend conservative treatment with ice or heat if needed.  Topical analgesic and ibuprofen if needed.  She will follow-up if this is not continuing to improve but I see no reason to send her for an x-ray today. I will have her fill out a medical release so that I may get records from her previous PCP at [facility].  She reports being up-to-date with her physical this year.  She also reports having recent labs.

## 2018-03-31 MED FILL — LOSARTAN-HCTZ 50-12.5 MG TA: 50-12.5 | 30 days supply | Qty: 30 | Fill #2

## 2018-03-31 MED FILL — ANASTROZOLE 1 MG TABLET: 1 | 30 days supply | Qty: 30 | Fill #0

## 2018-04-05 ENCOUNTER — Telehealth: Payer: Self-pay

## 2018-04-05 ENCOUNTER — Telehealth: Payer: Self-pay | Admitting: Family Medicine

## 2018-04-05 MED ORDER — LOSARTAN POTASSIUM-HCTZ 50-12.5 MG PO TABS: 1.0000 | ORAL_TABLET | Freq: Every day | ORAL | 2 refills | Status: DC

## 2018-04-05 NOTE — Telephone Encounter (Signed)
refilled 

## 2018-04-05 NOTE — Telephone Encounter (Signed)
SENT REFERRAL TO SCHEDULING, FILED NOTES

## 2018-04-05 NOTE — Telephone Encounter (Signed)
Please take care of this. Ok to give 3 months and follow up

## 2018-04-05 NOTE — Telephone Encounter (Signed)
Pt's daughter called and stated mom needs refills on bp meds. Please send to [facility]. Daughter can be reached at [phone].

## 2018-04-21 ENCOUNTER — Telehealth: Payer: Self-pay | Admitting: Family Medicine

## 2018-04-21 NOTE — Telephone Encounter (Signed)
Received requested records from [facility]. Sending back for review.

## 2018-04-28 ENCOUNTER — Other Ambulatory Visit: Payer: Self-pay | Admitting: *Deleted

## 2018-04-28 ENCOUNTER — Other Ambulatory Visit: Payer: Self-pay | Admitting: Oncology

## 2018-04-28 DIAGNOSIS — C50312 Malignant neoplasm of lower-inner quadrant of left female breast: Secondary | ICD-10-CM

## 2018-04-28 DIAGNOSIS — C50411 Malignant neoplasm of upper-outer quadrant of right female breast: Secondary | ICD-10-CM

## 2018-04-28 MED FILL — LOSARTAN-HCTZ 50-12.5 MG TA: 50-12.5 | 30 days supply | Qty: 30 | Fill #3

## 2018-04-28 MED FILL — ANASTROZOLE 1 MG TABLET: 1 | 30 days supply | Qty: 30 | Fill #0

## 2018-05-29 MED FILL — ANASTROZOLE 1 MG TABLET: 1 | 30 days supply | Qty: 30 | Fill #0

## 2018-05-29 MED FILL — LOSARTAN-HCTZ 50-12.5 MG TA: 50-12.5 | 30 days supply | Qty: 30 | Fill #0

## 2018-06-16 ENCOUNTER — Ambulatory Visit
Admission: RE | Admit: 2018-06-16 | Discharge: 2018-06-16 | Disposition: A | Discharge status: Home / Self Care | Payer: Medicare Other | Source: Ambulatory Visit | Attending: Adult Health | Admitting: Adult Health

## 2018-06-16 DIAGNOSIS — Z170 Estrogen receptor positive status [ER+]: Principal | ICD-10-CM

## 2018-06-16 DIAGNOSIS — C50411 Malignant neoplasm of upper-outer quadrant of right female breast: Secondary | ICD-10-CM

## 2018-06-16 DIAGNOSIS — E2839 Other primary ovarian failure: Secondary | ICD-10-CM

## 2018-06-19 ENCOUNTER — Encounter: Payer: Self-pay | Admitting: Family Medicine

## 2018-06-20 ENCOUNTER — Encounter: Payer: Self-pay | Admitting: Family Medicine

## 2018-06-23 ENCOUNTER — Encounter: Payer: Self-pay | Admitting: Internal Medicine

## 2018-06-28 ENCOUNTER — Ambulatory Visit (INDEPENDENT_AMBULATORY_CARE_PROVIDER_SITE_OTHER): Payer: Medicare Other | Admitting: Family Medicine

## 2018-06-28 ENCOUNTER — Encounter: Payer: Self-pay | Admitting: Family Medicine

## 2018-06-28 VITALS — BP 130/80 | HR 71 | Temp 98.5°F | Resp 16 | Wt 176.8 lb

## 2018-06-28 DIAGNOSIS — Z8639 Personal history of other endocrine, nutritional and metabolic disease: Secondary | ICD-10-CM

## 2018-06-28 DIAGNOSIS — M8580 Other specified disorders of bone density and structure, unspecified site: Secondary | ICD-10-CM

## 2018-06-28 DIAGNOSIS — E7800 Pure hypercholesterolemia, unspecified: Secondary | ICD-10-CM | POA: Diagnosis not present

## 2018-06-28 DIAGNOSIS — I10 Essential (primary) hypertension: Secondary | ICD-10-CM | POA: Diagnosis not present

## 2018-06-28 DIAGNOSIS — Z87898 Personal history of other specified conditions: Secondary | ICD-10-CM | POA: Diagnosis not present

## 2018-06-28 DIAGNOSIS — D649 Anemia, unspecified: Secondary | ICD-10-CM

## 2018-06-28 NOTE — Patient Instructions (Addendum)
Your recent bone density test shows osteopenia and not osteoporosis. It is recommended that you get at least 1200 mg of calcium in your diet and take a vitamin D supplement of 800 or 1000 international units daily.  Continue walking and getting weightbearing exercises as you are.  Your blood pressure today is 130/80 and is at goal range.  Continue on your current medications.  We will call you with your lab results.

## 2018-06-28 NOTE — Progress Notes (Signed)
Subjective:    Patient ID: Christina [name], female    DOB: Nov 29, 1945, 72 y.o.   MRN: [id number]  HPI Chief Complaint  Patient presents with  . heart issues    had some felling like she is going to pass out back in august/september when walking. hasn't had any issues since then. referred to cardiol back in august but never got an appt  . lab work    would like cholesterol checked. has been fasting today   She is fairly new to me and here requesting a referral to cardiology. Previous PCP was at [facility].  Apparently she had palpitations and a near syncopal episode back in the summer and a referral was entered by her previous PCP for her to see cardiology at that time.  States she did not end up getting an appointment with them for some unknown reason. Denies unexplained fatigue, chest pain, palpitations, DOE, PND, orthopnea, abdominal pain, N/V/D, LE edema.   She is fasting and requests labs to check her cholesterol.  History of elevated LDL and is not currently on a statin. Has taken one in the past and unknown as to why she stopped.   History of anemia. Is not currently on iron.   Would like to discuss her recent bone density.  She feels like she gets calcium in her diet. Is not on a vitamin D supplement and she recalls having low vitamin D in the past.  She walks for exercise at least 3 days per week.   Unknown if heart disease in her family.   Does not drink alcohol or smoke.   Reviewed allergies, medications, past medical, surgical, family, and social history.    Review of Systems Pertinent positives and negatives in the history of present illness.     Objective:   Physical Exam BP 130/80   Pulse 71   Temp 98.5 F (36.9 C) (Oral)   Resp 16   Wt 176 lb 12.8 oz (80.2 kg)   SpO2 99%   BMI 28.97 kg/m   Alert and oriented and in no distress.  Pharyngeal area is normal. Neck is supple without adenopathy or thyromegaly. Cardiac exam shows a regular sinus  rhythm without murmurs or gallops. Lungs are clear to auscultation. Extremities without edema, pulses intact.  Skin is warm and dry, no pallor.        Assessment & Plan:  Essential hypertension - Plan: CBC with Differential/Platelet, Comprehensive metabolic panel, TSH  History of palpitations - Plan: TSH  Elevated LDL cholesterol level - Plan: Lipid panel  Osteopenia, unspecified location - Plan: VITAMIN D 25 Hydroxy (Vit-D Deficiency, Fractures)  History of vitamin D deficiency - Plan: VITAMIN D 25 Hydroxy (Vit-D Deficiency, Fractures)  Anemia, unspecified type - Plan: CBC with Differential/Platelet, Iron, TIBC and Ferritin Panel  Discussed that her blood pressure is in goal range today.  She will continue on current medications for now. History of hyperlipidemia and has been on a statin in the past.  Unclear as to why she stopped this.  Recheck fasting lipids today and start her back on a statin as appropriate. History of palpitations in the summer but none since.  She is requesting referral to cardiology however we discussed that she is currently asymptomatic and we also discussed cardiac risk factors.  We will check labs and hold off for now on referral.  She will let me know if she has another episode of palpitations or if she develops chest pain, DOE  or any other symptoms.  She is pleased with this plan. Reviewed recent bone density result with her.  She has osteopenia.  Discussed getting adequate calcium in her diet, taking a vitamin D supplement and getting weightbearing exercises.  History of vitamin D deficiency and is not currently on a supplement.  Recheck vitamin D level today History of anemia-not currently on iron supplement.  Check CBC and iron studies. Follow-up pending labs.

## 2018-06-29 ENCOUNTER — Other Ambulatory Visit: Payer: Self-pay | Admitting: Family Medicine

## 2018-06-29 ENCOUNTER — Encounter: Payer: Self-pay | Admitting: Family Medicine

## 2018-06-29 DIAGNOSIS — E559 Vitamin D deficiency, unspecified: Secondary | ICD-10-CM

## 2018-06-29 HISTORY — DX: Vitamin D deficiency, unspecified: E55.9

## 2018-06-29 LAB — COMPREHENSIVE METABOLIC PANEL
ALBUMIN: 4.3 g/dL (ref 3.5–4.8)
ALT: 20 IU/L (ref 0–32)
AST: 22 IU/L (ref 0–40)
Albumin/Globulin Ratio: 1.7 (ref 1.2–2.2)
Alkaline Phosphatase: 122 IU/L — ABNORMAL HIGH (ref 39–117)
BUN/Creatinine Ratio: 23 (ref 12–28)
BUN: 21 mg/dL (ref 8–27)
Bilirubin Total: 0.5 mg/dL (ref 0.0–1.2)
CO2: 22 mmol/L (ref 20–29)
Calcium: 9.7 mg/dL (ref 8.7–10.3)
Chloride: 105 mmol/L (ref 96–106)
Creatinine, Ser: 0.92 mg/dL (ref 0.57–1.00)
GFR calc Af Amer: 73 mL/min/{1.73_m2} (ref >59)
GFR calc non Af Amer: 63 mL/min/{1.73_m2} (ref >59)
GLOBULIN, TOTAL: 2.5 g/dL (ref 1.5–4.5)
Glucose: 89 mg/dL (ref 65–99)
Potassium: 4 mmol/L (ref 3.5–5.2)
SODIUM: 143 mmol/L (ref 134–144)
Total Protein: 6.8 g/dL (ref 6.0–8.5)

## 2018-06-29 LAB — VITAMIN D 25 HYDROXY (VIT D DEFICIENCY, FRACTURES): VIT D 25 HYDROXY: 14.5 ng/mL — AB (ref 30.0–100.0)

## 2018-06-29 LAB — CBC WITH DIFFERENTIAL/PLATELET
Basophils Absolute: 0 10*3/uL (ref 0.0–0.2)
Basos: 1 %
EOS (ABSOLUTE): 0.1 10*3/uL (ref 0.0–0.4)
EOS: 3 %
HEMATOCRIT: 32.3 % — AB (ref 34.0–46.6)
Hemoglobin: 10.5 g/dL — ABNORMAL LOW (ref 11.1–15.9)
IMMATURE GRANS (ABS): 0 10*3/uL (ref 0.0–0.1)
IMMATURE GRANULOCYTES: 0 %
LYMPHS: 39 %
Lymphocytes Absolute: 1.3 10*3/uL (ref 0.7–3.1)
MCH: 27.3 pg (ref 26.6–33.0)
MCHC: 32.5 g/dL (ref 31.5–35.7)
MCV: 84 fL (ref 79–97)
MONOCYTES: 10 %
Monocytes Absolute: 0.3 10*3/uL (ref 0.1–0.9)
Neutrophils Absolute: 1.6 10*3/uL (ref 1.4–7.0)
Neutrophils: 47 %
Platelets: 227 10*3/uL (ref 150–450)
RBC: 3.85 x10E6/uL (ref 3.77–5.28)
RDW: 11.8 % — AB (ref 12.3–15.4)
WBC: 3.4 10*3/uL (ref 3.4–10.8)

## 2018-06-29 LAB — LIPID PANEL
Chol/HDL Ratio: 3 ratio (ref 0.0–4.4)
Cholesterol, Total: 191 mg/dL (ref 100–199)
HDL: 64 mg/dL (ref >39)
LDL CALC: 115 mg/dL — AB (ref 0–99)
Triglycerides: 58 mg/dL (ref 0–149)
VLDL Cholesterol Cal: 12 mg/dL (ref 5–40)

## 2018-06-29 LAB — IRON,TIBC AND FERRITIN PANEL
Ferritin: 320 ng/mL — ABNORMAL HIGH (ref 15–150)
IRON SATURATION: 37 % (ref 15–55)
Iron: 110 ug/dL (ref 27–139)
TIBC: 300 ug/dL (ref 250–450)
UIBC: 190 ug/dL (ref 118–369)

## 2018-06-29 LAB — TSH: TSH: 0.718 u[IU]/mL (ref 0.450–4.500)

## 2018-06-29 MED ORDER — SIMVASTATIN 10 MG PO TABS: 10.0000 mg | ORAL_TABLET | Freq: Every day | ORAL | 2 refills | Status: DC

## 2018-06-29 MED ORDER — VITAMIN D (ERGOCALCIFEROL) 1.25 MG (50000 UNIT) PO CAPS
50000.0000 [IU] | ORAL_CAPSULE | ORAL | 0 refills | Status: DC
Start: 2018-06-29 — End: 2021-01-12

## 2018-06-29 MED FILL — SIMVASTATIN 10 MG TABS: 10 | 30 days supply | Qty: 30 | Fill #0

## 2018-06-30 MED FILL — ANASTROZOLE 1 MG TABLET: 1 | 30 days supply | Qty: 30 | Fill #1

## 2018-06-30 MED FILL — LOSARTAN-HCTZ 50-12.5 MG TA: 50-12.5 | 30 days supply | Qty: 30 | Fill #1

## 2018-07-04 ENCOUNTER — Encounter: Payer: Self-pay | Admitting: Internal Medicine

## 2018-07-27 MED FILL — LOSARTAN-HCTZ 100-25 MG TAB: 100-25 | 30 days supply | Qty: 15 | Fill #0

## 2018-07-27 MED FILL — ANASTROZOLE 1 MG TABLET: 1 | 30 days supply | Qty: 30 | Fill #2

## 2018-07-27 MED FILL — SIMVASTATIN 10 MG TABS: 10 | 30 days supply | Qty: 30 | Fill #1

## 2018-08-25 MED FILL — ANASTROZOLE 1 MG TABLET: 1 | 30 days supply | Qty: 30 | Fill #3

## 2018-08-25 MED FILL — SIMVASTATIN 10 MG TABS: 10 | 30 days supply | Qty: 30 | Fill #2

## 2018-08-25 MED FILL — LOSARTAN-HCTZ 100-25 MG TAB: 100-25 | 30 days supply | Qty: 15 | Fill #1

## 2018-09-22 ENCOUNTER — Telehealth: Payer: Self-pay | Admitting: Oncology

## 2018-09-22 NOTE — Telephone Encounter (Signed)
Patient came in to reschedule her lab and f/u. Per MD it was okay to schedule her for 02/07 @3 :62.  Printed calendar for patient.

## 2018-09-28 ENCOUNTER — Other Ambulatory Visit: Payer: Medicare Other

## 2018-09-28 ENCOUNTER — Other Ambulatory Visit: Payer: Self-pay

## 2018-09-28 ENCOUNTER — Ambulatory Visit: Payer: Medicare Other | Admitting: Oncology

## 2018-09-28 DIAGNOSIS — C50411 Malignant neoplasm of upper-outer quadrant of right female breast: Secondary | ICD-10-CM

## 2018-09-28 DIAGNOSIS — Z170 Estrogen receptor positive status [ER+]: Principal | ICD-10-CM

## 2018-09-28 NOTE — Progress Notes (Signed)
[facility]  Telephone:([phone]) [phone] Fax:(336) [phone]     ID: Christina [name] DOB: 07/17/1946  MR#: [id number]  OYD#:[id number]  Patient Care Team: Girtha Rm, NP-C as PCP - General (Family Medicine) Magrinat, Virgie Dad, MD as Consulting Physician (Oncology) Eppie Gibson, MD as Attending Physician (Radiation Oncology) Erroll Luna, MD as Consulting Physician (General Surgery) Pavelock, Ralene Bathe, MD as Consulting Physician (Specialist) PCP: Girtha Rm, NP-C GYN: OTHER MD:  CHIEF COMPLAINT: Estrogen receptor positive breast cancer  CURRENT TREATMENT: anastrozole   BREAST CANCER HISTORY: From the original intake note:    Christina [name] had screening mammography (not available for review today) showing a possible mass in the right breast. On 06/09/2015 she underwent unilateral right mammography with tomosynthesis at the breast Center. This found the breast density to be category D. The area of distortion seen on the 2-D screening did not persist, but there was a different area of distortion in the outer right breast above the nipple. This was palpable at the 9:00 position 3 cm from the nipple. Ultrasound showed 2 adjacent irregular hypoechoic masses, the larger containing internal echogenic foci. This measured 1.9 cm. 5 mm lateral to this the second mass measured 1.2 cm maximally. Taken together the 2 masses measured 3.3 cm maximally.  Biopsy of the 2 masses in question 06/13/2015 showed ([id number]) identical invasive ductal carcinomas, estrogen receptor 100% positive, progesterone receptor 100% positive, both with strong staining intensity, with an MIB-1 of 5%, and no HER-2 amplification, the signals ratio being 1.15 and the number per cell 1.95.  The patient's subsequent history is as detailed below  INTERVAL HISTORY: Christina [name] returns today for follow-up and treatment of her estrogen receptor positive breast cancers.   She continues on anastrozole. She has hot  flashes, but she had them before she began this medicine and they are unchanged.   Since her last visit here, she underwent a bone density screening on 06/16/2018 showing a T-score of -1.8, which is considered osteopenic. She has been taking Vitamin D to treat bone density.   She also underwent a digital diagnostic bilateral mammogram on 06/16/2018 showing: Breast Density Category C. There is no mammographic evidence for malignancy.    REVIEW OF SYSTEMS: Christina [name] says she has been working. She has been walking; she notices that her strength is better than in past years. She occasionally has some shooting pains in her breast, mostly when she works. The patient denies unusual headaches, visual changes, nausea, vomiting, or dizziness. There has been no unusual cough, phlegm production, or pleurisy. This been no change in bowel or bladder habits. The patient denies unexplained fatigue or unexplained weight loss, bleeding, rash, or fever. A detailed review of systems was otherwise noncontributory.     PAST MEDICAL HISTORY: Past Medical History:  Diagnosis Date  . Anemia   . Breast cancer ([place]) 08/28/2015   Bilateral  . Breast cancer of upper-outer quadrant of right female breast ([place]) 06/23/2015  . High cholesterol   . Hx of radiation therapy 11/05/15- 12/16/15   Right Breast and Left Breast  . Hypertension   . Personal history of radiation therapy    Right Breast Cancer  . Vitamin D deficiency 06/29/2018    PAST SURGICAL HISTORY: Past Surgical History:  Procedure Laterality Date  . BREAST LUMPECTOMY Left 08/28/2015  . BREAST LUMPECTOMY Right 08/28/2015  . BREAST LUMPECTOMY WITH NEEDLE LOCALIZATION AND AXILLARY SENTINEL LYMPH NODE BX Bilateral 08/28/2015   Procedure: BILATERAL BREAST LUMPECTOMY WITH BILATERAL  NEEDLE  LOCALIZATION AND BILATERAL AXILLARY SENTINEL LYMPH NODE BX;  Surgeon: Erroll Luna, MD;  Location: [place];  Service: General;  Laterality: Bilateral;    FAMILY HISTORY No family  history on file. The patient's parents died when she was very young. She thinks her father may have died from "high blood pressure", and her mother from cancer, possibly of the uterus. He is alive 3 brothers, 2 sisters. One brother died with lung cancer. He had a history of tobacco abuse. One other brother and one sister have died from complications of diabetes  GYNECOLOGIC HISTORY:  No LMP recorded. Patient has had a hysterectomy. Menarche age 78, first live birth age 42, the patient is Christina [name] P2. She does not recall exactly when she stopped having periods. She never took hormone replacement. She never used oral contraceptives.  SOCIAL HISTORY:  Christina [name] works in the [name]. At home she lives with her daughter Christina [name] myeloma who works in [employer], and son Christina [name], who is unemployed and has a history of schizophrenia.    ADVANCED DIRECTIVES: Not in place. At the initial clinic visit the patient was given the appropriate forms to complete and notarize at her discretion.  HEALTH MAINTENANCE: Social History   Tobacco Use  . Smoking status: Never Smoker  . Smokeless tobacco: Never Used  Substance Use Topics  . Alcohol use: No  . Drug use: No     Colonoscopy: never  PAP:  Bone density: remote  Lipid panel:  Allergies  Allergen Reactions  . Aspirin Nausea Only  . Onion Nausea And Vomiting    White onion  . Hydrocodone Rash    With itching    Current Outpatient Medications  Medication Sig Dispense Refill  . anastrozole (ARIMIDEX) 1 MG tablet Take 1 tablet (1 mg total) by mouth daily. 90 tablet 4  . fexofenadine (ALLEGRA) 60 MG tablet Take 1 tablet (60 mg total) by mouth 2 (two) times daily. (Patient not taking: Reported on 06/28/2018) 60 tablet 0  . losartan-hydrochlorothiazide (HYZAAR) 50-12.5 MG tablet Take 1 tablet by mouth daily. 90 tablet 4  . simvastatin (ZOCOR) 10 MG tablet Take 1 tablet (10 mg total) by mouth at bedtime. 30  tablet 2  . traZODone (DESYREL) 50 MG tablet     . Vitamin D, Ergocalciferol, (DRISDOL) 1.25 MG (50000 UT) CAPS capsule Take 1 capsule (50,000 Units total) by mouth every 7 (seven) days. 8 capsule 0   No current facility-administered medications for this visit.     OBJECTIVE: Middle-aged African-American woman who appears well  Vitals:   09/29/18 1555  BP: 119/66  Pulse: 76  Resp: 18  Temp: 99 F (37.2 C)  SpO2: 100%     Body mass index is 28.76 kg/m.    ECOG FS:0 - Asymptomatic  Sclerae unicteric, right exotropia previously noted No cervical or supraclavicular adenopathy Lungs no rales or rhonchi Heart regular rate and rhythm Abd soft, nontender, positive bowel sounds MSK no focal spinal tenderness, no upper extremity lymphedema Neuro: nonfocal, well oriented, appropriate affect Breasts: Status post bilateral lumpectomies and bilateral radiation.  There is no evidence of recurrent disease.  Both axillae are benign.  LAB RESULTS:  CMP     Component Value Date/Time   NA 142 09/29/2018 1509   NA 143 06/28/2018 1550   NA 143 09/23/2016 1426   K 3.8 09/29/2018 1509   K 4.5 09/23/2016 1426   CL 105 09/29/2018 1509   CO2 28 09/29/2018  1509   CO2 29 09/23/2016 1426   GLUCOSE 88 09/29/2018 1509   GLUCOSE 95 09/23/2016 1426   BUN 20 09/29/2018 1509   BUN 21 06/28/2018 1550   BUN 21.8 09/23/2016 1426   CREATININE 1.05 (H) 09/29/2018 1509   CREATININE 1.1 09/23/2016 1426   CALCIUM 9.6 09/29/2018 1509   CALCIUM 10.0 09/23/2016 1426   PROT 7.4 09/29/2018 1509   PROT 6.8 06/28/2018 1550   PROT 7.4 09/23/2016 1426   ALBUMIN 4.0 09/29/2018 1509   ALBUMIN 4.3 06/28/2018 1550   ALBUMIN 4.0 09/23/2016 1426   AST 22 09/29/2018 1509   AST 23 09/23/2016 1426   ALT 19 09/29/2018 1509   ALT 19 09/23/2016 1426   ALKPHOS 125 09/29/2018 1509   ALKPHOS 151 (H) 09/23/2016 1426   BILITOT 0.6 09/29/2018 1509   BILITOT 0.58 09/23/2016 1426   GFRNONAA 54 (L) 09/29/2018 1509   GFRAA  >60 09/29/2018 1509    INo results found for: SPEP, UPEP  Lab Results  Component Value Date   WBC 3.9 (L) 09/29/2018   NEUTROABS 1.9 09/29/2018   HGB 10.4 (L) 09/29/2018   HCT 33.5 (L) 09/29/2018   MCV 86.8 09/29/2018   PLT 226 09/29/2018      Chemistry      Component Value Date/Time   NA 142 09/29/2018 1509   NA 143 06/28/2018 1550   NA 143 09/23/2016 1426   K 3.8 09/29/2018 1509   K 4.5 09/23/2016 1426   CL 105 09/29/2018 1509   CO2 28 09/29/2018 1509   CO2 29 09/23/2016 1426   BUN 20 09/29/2018 1509   BUN 21 06/28/2018 1550   BUN 21.8 09/23/2016 1426   CREATININE 1.05 (H) 09/29/2018 1509   CREATININE 1.1 09/23/2016 1426      Component Value Date/Time   CALCIUM 9.6 09/29/2018 1509   CALCIUM 10.0 09/23/2016 1426   ALKPHOS 125 09/29/2018 1509   ALKPHOS 151 (H) 09/23/2016 1426   AST 22 09/29/2018 1509   AST 23 09/23/2016 1426   ALT 19 09/29/2018 1509   ALT 19 09/23/2016 1426   BILITOT 0.6 09/29/2018 1509   BILITOT 0.58 09/23/2016 1426       No results found for: LABCA2  No components found for: LABCA125  No results for input(s): INR in the last 168 hours.  Urinalysis    Component Value Date/Time   COLORURINE yellow 04/01/2010 0000   APPEARANCEUR Clear 04/01/2010 0000   LABSPEC 1.010 10/25/2017 2022   PHURINE 6.5 10/25/2017 2022   GLUCOSEU NEGATIVE 10/25/2017 2022   HGBUR TRACE (A) 10/25/2017 2022   HGBUR negative 04/01/2010 0000   BILIRUBINUR NEGATIVE 10/25/2017 2022   [place] NEGATIVE 10/25/2017 2022   PROTEINUR NEGATIVE 10/25/2017 2022   UROBILINOGEN 0.2 10/25/2017 2022   NITRITE NEGATIVE 10/25/2017 2022   LEUKOCYTESUR SMALL (A) 10/25/2017 2022    STUDIES: Mammography reviewed viewed with the patient  ASSESSMENT: 73 y.o. [place] woman status post right breastUpper outer quadrant biopsy 2 on 06/13/2015 for morphologically identical invasive ductal carcinomas measuring 1.9 and 1.2 cm, grade 2, estrogen and progesterone receptor strongly  positive, HER-2 not amplified, with an MIB-1 of 5%  (1) bilateral lumpectomies 08/28/2015 showed  (a) on the right, apT2 pN0,stage IIA invasive ductal carcinoma, grade 1, with repeat HER-2 again negative  (b) on the left, apT1c pN0, stage IA invasive ductal carcinoma, grade 1, with repeat HER-2 again negative  (2) Oncotype score of 5 predicts a risk of outside the breast recurrence within  10 years of 5% if the patient's only systemic therapy is tamoxifen for 5 years. It also predicts no significant benefit from chemotherapy   (3) adjuvant radiation completed 11/05/2015 - 12/16/2015  Site/dose:    - Right Breast / 46 Gy in 23 fractions  - Right Breast Boost / 14 Gy in 7 fractions  - Left Breast / 46 Gy in 23 fractions  - Left Breast Boost / 14 Gy in 7 fractions  (4) anastrozole started 12/29/2015  (a) Bone density 05/12/2016 shows a T score of -1.7  (b) Bone density 06/16/2018 shows a T-score of -1.8   PLAN: Christina [name] is now just over 3 years out from definitive surgery for breast cancers with no evidence of disease recurrence.  This is very favorable.  She is tolerating anastrozole well, with no significant worsening of her osteopenia.  The plan will be to continue that to a total of 5 years  She will return to see me in 1 year.  She knows to call for any other issues that may develop before then.  Magrinat, Virgie Dad, MD  09/29/18 4:40 PM Medical Oncology and Hematology [facility] [address] [place], [place] [address] Tel. [phone]    Fax. [phone]  I, Jacqualyn Posey am acting as a Education administrator for Chauncey Cruel, MD.   I, Lurline Del MD, have reviewed the above documentation for accuracy and completeness, and I agree with the above.

## 2018-09-29 ENCOUNTER — Other Ambulatory Visit: Payer: Self-pay | Admitting: Family Medicine

## 2018-09-29 ENCOUNTER — Inpatient Hospital Stay: Payer: Medicare Other | Attending: Oncology

## 2018-09-29 ENCOUNTER — Inpatient Hospital Stay (HOSPITAL_BASED_OUTPATIENT_CLINIC_OR_DEPARTMENT_OTHER): Payer: Medicare Other | Admitting: Oncology

## 2018-09-29 VITALS — BP 119/66 | HR 76 | Temp 99.0°F | Resp 18 | Wt 175.5 lb

## 2018-09-29 DIAGNOSIS — Z79899 Other long term (current) drug therapy: Secondary | ICD-10-CM

## 2018-09-29 DIAGNOSIS — C50312 Malignant neoplasm of lower-inner quadrant of left female breast: Secondary | ICD-10-CM

## 2018-09-29 DIAGNOSIS — C50411 Malignant neoplasm of upper-outer quadrant of right female breast: Secondary | ICD-10-CM

## 2018-09-29 DIAGNOSIS — Z923 Personal history of irradiation: Secondary | ICD-10-CM | POA: Insufficient documentation

## 2018-09-29 DIAGNOSIS — Z170 Estrogen receptor positive status [ER+]: Secondary | ICD-10-CM | POA: Diagnosis not present

## 2018-09-29 DIAGNOSIS — Z79811 Long term (current) use of aromatase inhibitors: Secondary | ICD-10-CM | POA: Insufficient documentation

## 2018-09-29 DIAGNOSIS — M8580 Other specified disorders of bone density and structure, unspecified site: Secondary | ICD-10-CM | POA: Diagnosis not present

## 2018-09-29 DIAGNOSIS — I10 Essential (primary) hypertension: Secondary | ICD-10-CM | POA: Diagnosis not present

## 2018-09-29 LAB — CBC WITH DIFFERENTIAL (CANCER CENTER ONLY)
Abs Immature Granulocytes: 0.01 10*3/uL (ref 0.00–0.07)
BASOS ABS: 0 10*3/uL (ref 0.0–0.1)
Basophils Relative: 1 %
EOS PCT: 4 %
Eosinophils Absolute: 0.1 10*3/uL (ref 0.0–0.5)
HCT: 33.5 % — ABNORMAL LOW (ref 36.0–46.0)
Hemoglobin: 10.4 g/dL — ABNORMAL LOW (ref 12.0–15.0)
Immature Granulocytes: 0 %
LYMPHS PCT: 37 %
Lymphs Abs: 1.5 10*3/uL (ref 0.7–4.0)
MCH: 26.9 pg (ref 26.0–34.0)
MCHC: 31 g/dL (ref 30.0–36.0)
MCV: 86.8 fL (ref 80.0–100.0)
Monocytes Absolute: 0.4 10*3/uL (ref 0.1–1.0)
Monocytes Relative: 11 %
NRBC: 0 % (ref 0.0–0.2)
Neutro Abs: 1.9 10*3/uL (ref 1.7–7.7)
Neutrophils Relative %: 47 %
Platelet Count: 226 10*3/uL (ref 150–400)
RBC: 3.86 MIL/uL — AB (ref 3.87–5.11)
RDW: 12.7 % (ref 11.5–15.5)
WBC: 3.9 10*3/uL — AB (ref 4.0–10.5)

## 2018-09-29 LAB — CMP (CANCER CENTER ONLY)
ALT: 19 U/L (ref 0–44)
ANION GAP: 9 (ref 5–15)
AST: 22 U/L (ref 15–41)
Albumin: 4 g/dL (ref 3.5–5.0)
Alkaline Phosphatase: 125 U/L (ref 38–126)
BUN: 20 mg/dL (ref 8–23)
CHLORIDE: 105 mmol/L (ref 98–111)
CO2: 28 mmol/L (ref 22–32)
CREATININE: 1.05 mg/dL — AB (ref 0.44–1.00)
Calcium: 9.6 mg/dL (ref 8.9–10.3)
GFR, EST NON AFRICAN AMERICAN: 54 mL/min — AB (ref >60)
Glucose, Bld: 88 mg/dL (ref 70–99)
Potassium: 3.8 mmol/L (ref 3.5–5.1)
Sodium: 142 mmol/L (ref 135–145)
Total Bilirubin: 0.6 mg/dL (ref 0.3–1.2)
Total Protein: 7.4 g/dL (ref 6.5–8.1)

## 2018-09-29 MED ORDER — ANASTROZOLE 1 MG PO TABS: 1.0000 mg | ORAL_TABLET | Freq: Every day | ORAL | 4 refills | Status: DC

## 2018-09-29 MED ORDER — LOSARTAN POTASSIUM-HCTZ 50-12.5 MG PO TABS: 1.0000 | ORAL_TABLET | Freq: Every day | ORAL | 4 refills | Status: DC

## 2018-09-29 MED FILL — LOSARTAN-HCTZ 100-25 MG TAB: 100-25 | 30 days supply | Qty: 15 | Fill #0

## 2018-09-29 MED FILL — ANASTROZOLE 1 MG TABLET: 1 | 30 days supply | Qty: 30 | Fill #0

## 2018-10-09 ENCOUNTER — Ambulatory Visit (HOSPITAL_COMMUNITY)
Admission: EM | Admit: 2018-10-09 | Discharge: 2018-10-09 | Disposition: A | Discharge status: Home / Self Care | Payer: Medicare Other | Attending: Internal Medicine | Admitting: Internal Medicine

## 2018-10-09 ENCOUNTER — Other Ambulatory Visit: Payer: Self-pay

## 2018-10-09 ENCOUNTER — Encounter (HOSPITAL_COMMUNITY): Payer: Self-pay | Admitting: Emergency Medicine

## 2018-10-09 DIAGNOSIS — K529 Noninfective gastroenteritis and colitis, unspecified: Secondary | ICD-10-CM

## 2018-10-09 NOTE — ED Provider Notes (Signed)
[place]    CSN: [id number] Arrival date & time: 10/09/18  1913     History   Chief Complaint Chief Complaint  Patient presents with  . Diarrhea    HPI Christina [name] is a 73 y.o. female.   She presents today with diarrhea.  She had the onset of vomiting and diarrhea on 2/15.  Vomiting subsided after several hours, but still having some difficulty with watery and at times explosive stool.  No blood.  No fever, not coughing.  Her appetite is improving, although not normal yet.  No abdominal pain.  She is a little bit worried about returning to work tomorrow, she works in a [employer], and she is afraid she will have an accident trying to get to the bathroom in time.  Stool frequency is improving, but she would like a note in case.    HPI  Past Medical History:  Diagnosis Date  . Anemia   . Breast cancer ([place]) 08/28/2015   Bilateral  . Breast cancer of upper-outer quadrant of right female breast ([place]) 06/23/2015  . High cholesterol   . Hx of radiation therapy 11/05/15- 12/16/15   Right Breast and Left Breast  . Hypertension   . Personal history of radiation therapy    Right Breast Cancer  . Vitamin D deficiency 06/29/2018    Patient Active Problem List   Diagnosis Date Noted  . Vitamin D deficiency 06/29/2018  . Malignant neoplasm of lower-inner quadrant of left breast in female, estrogen receptor positive ([place]) 10/20/2015  . Malignant neoplasm of upper-outer quadrant of right breast in female, estrogen receptor positive ([place]) 06/23/2015  . CONSTIPATION 04/01/2010  . PALPITATIONS 04/01/2010  . URINALYSIS, ABNORMAL 04/01/2010  . ANEMIA 05/22/2009  . DENTAL CARIES 04/21/2009  . Elevated LDL cholesterol level 03/18/2008  . Essential hypertension 08/23/2006    Past Surgical History:  Procedure Laterality Date  . BREAST LUMPECTOMY Left 08/28/2015  . BREAST LUMPECTOMY Right 08/28/2015  . BREAST LUMPECTOMY WITH NEEDLE LOCALIZATION AND AXILLARY SENTINEL  LYMPH NODE BX Bilateral 08/28/2015   Procedure: BILATERAL BREAST LUMPECTOMY WITH BILATERAL  NEEDLE LOCALIZATION AND BILATERAL AXILLARY SENTINEL LYMPH NODE BX;  Surgeon: Erroll Luna, MD;  Location: [facility];  Service: General;  Laterality: Bilateral;       Home Medications    Prior to Admission medications   Medication Sig Start Date End Date Taking? Authorizing Provider  anastrozole (ARIMIDEX) 1 MG tablet Take 1 tablet (1 mg total) by mouth daily. 09/29/18   Magrinat, Virgie Dad, MD  fexofenadine (ALLEGRA) 60 MG tablet Take 1 tablet (60 mg total) by mouth 2 (two) times daily. Patient not taking: Reported on 06/28/2018 12/11/17   Tereasa Coop, PA-C  losartan-hydrochlorothiazide (HYZAAR) 50-12.5 MG tablet Take 1 tablet by mouth daily. 09/29/18   Magrinat, Virgie Dad, MD  simvastatin (ZOCOR) 10 MG tablet TAKE 1 TABLET BY MOUTH AT BEDTIME. 09/29/18   Henson, Vickie L, NP-C  traZODone (DESYREL) 50 MG tablet  09/29/17   [provider]  Vitamin D, Ergocalciferol, (DRISDOL) 1.25 MG (50000 UT) CAPS capsule Take 1 capsule (50,000 Units total) by mouth every 7 (seven) days. 06/29/18   Girtha Rm, NP-C    Family History History reviewed. No pertinent family history.  Social History Social History   Tobacco Use  . Smoking status: Never Smoker  . Smokeless tobacco: Never Used  Substance Use Topics  . Alcohol use: No  . Drug use: No     Allergies  Aspirin; Onion; and Hydrocodone   Review of Systems Review of Systems  All other systems reviewed and are negative.    Physical Exam Triage Vital Signs ED Triage Vitals  Enc Vitals Group     BP 10/09/18 2025 133/65     Pulse Rate 10/09/18 2025 84     Resp 10/09/18 2025 18     Temp 10/09/18 2025 98.5 F (36.9 C)     Temp Source 10/09/18 2025 Temporal     SpO2 10/09/18 2025 99 %     Weight --      Height --      Pain Score 10/09/18 2023 0     Pain Loc --    Updated Vital Signs BP 133/65 (BP Location: Right Arm)   Pulse 84    Temp 98.5 F (36.9 C) (Temporal)   Resp 18   SpO2 99%  Physical Exam Vitals signs and nursing note reviewed.  Constitutional:      General: She is not in acute distress.    Comments: Alert, nicely groomed  HENT:     Head: Atraumatic.  Eyes:     Comments: Conjugate gaze, no eye redness/drainage  Neck:     Musculoskeletal: Neck supple.  Cardiovascular:     Rate and Rhythm: Normal rate and regular rhythm.  Pulmonary:     Effort: No respiratory distress.     Breath sounds: No wheezing or rales.     Comments: Lungs clear, symmetric breath sounds  Abdominal:     General: There is no distension.  Musculoskeletal: Normal range of motion.     Comments: No leg swelling  Skin:    General: Skin is warm and dry.     Comments: No cyanosis  Neurological:     Mental Status: She is alert and oriented to person, place, and time.       Final Clinical Impressions(s) / UC Diagnoses   Final diagnoses:  Acute gastroenteritis     Discharge Instructions     Push fluids and rest.  Diet as tolerated.  Note for work tomorrow.  Anticipate continued gradual improvement in stool frequency and consistency, as well as in appetite.  Stools may take a couple weeks to return to normal patterns.  Recheck for new fever >100.5, return of vomiting, severe/sustained abdominal pain, or if not improving as expected.      ED Prescriptions    None        Wynona Luna, MD 10/15/18 2027

## 2018-10-09 NOTE — ED Triage Notes (Signed)
Patient initially had vomiting and diarrhea.  Patient vomited for 1 day, then no more.  Diarrhea intermittently.

## 2018-10-09 NOTE — Discharge Instructions (Addendum)
Push fluids and rest.  Diet as tolerated.  Note for work tomorrow.  Anticipate continued gradual improvement in stool frequency and consistency, as well as in appetite.  Stools may take a couple weeks to return to normal patterns.  Recheck for new fever >100.5, return of vomiting, severe/sustained abdominal pain, or if not improving as expected.

## 2018-10-27 MED FILL — LOSARTAN-HCTZ 100-25 MG TAB: 100-25 | 30 days supply | Qty: 15 | Fill #1

## 2018-10-27 MED FILL — ANASTROZOLE 1 MG TABLET: 1 | 30 days supply | Qty: 30 | Fill #1

## 2018-11-24 MED FILL — LOSARTAN-HCTZ 100-25 MG TAB: 100-25 | 30 days supply | Qty: 15 | Fill #2

## 2018-11-24 MED FILL — ANASTROZOLE 1 MG TABLET: 1 | 30 days supply | Qty: 30 | Fill #2

## 2018-12-29 MED FILL — ANASTROZOLE 1 MG TABLET: 1 | 30 days supply | Qty: 30 | Fill #3

## 2018-12-29 MED FILL — LOSARTAN-HCTZ 100-25 MG TAB: 100-25 | 30 days supply | Qty: 15 | Fill #3

## 2019-01-29 MED FILL — LOSARTAN-HCTZ 100-25 MG TAB: 100-25 | 30 days supply | Qty: 15 | Fill #4

## 2019-01-29 MED FILL — ANASTROZOLE 1 MG TABLET: 1 | 30 days supply | Qty: 30 | Fill #4

## 2019-02-23 MED FILL — LOSARTAN-HCTZ 100-25 MG TAB: 100-25 | 30 days supply | Qty: 15 | Fill #5

## 2019-02-23 MED FILL — ANASTROZOLE 1 MG TABLET: 1 | 30 days supply | Qty: 30 | Fill #5

## 2019-03-30 MED FILL — ANASTROZOLE 1 MG TABLET: 1 | 30 days supply | Qty: 30 | Fill #6

## 2019-03-30 MED FILL — LOSARTAN-HCTZ 100-25 MG TAB: 100-25 | 30 days supply | Qty: 15 | Fill #6

## 2019-04-26 MED FILL — LOSARTAN-HCTZ 100-25 MG TAB: 100-25 | 30 days supply | Qty: 15 | Fill #7

## 2019-04-26 MED FILL — ANASTROZOLE 1 MG TABLET: 1 | 30 days supply | Qty: 30 | Fill #7

## 2019-05-24 MED FILL — LOSARTAN-HCTZ 100-25 MG TAB: 100-25 | 30 days supply | Qty: 15 | Fill #8

## 2019-05-24 MED FILL — ANASTROZOLE 1 MG TABLET: 1 | 30 days supply | Qty: 30 | Fill #8

## 2019-05-28 ENCOUNTER — Other Ambulatory Visit: Payer: Self-pay | Admitting: Adult Health

## 2019-05-28 DIAGNOSIS — Z9889 Other specified postprocedural states: Secondary | ICD-10-CM

## 2019-06-22 MED FILL — LOSARTAN-HCTZ 100-25 MG TAB: 100-25 | 30 days supply | Qty: 15 | Fill #9

## 2019-06-22 MED FILL — SIMVASTATIN 10 MG TABLET: 10 | 30 days supply | Qty: 30 | Fill #0

## 2019-06-22 MED FILL — ANASTROZOLE 1 MG TABLET: 1 | 30 days supply | Qty: 30 | Fill #9

## 2019-06-25 ENCOUNTER — Ambulatory Visit
Admission: RE | Admit: 2019-06-25 | Discharge: 2019-06-25 | Disposition: A | Discharge status: Home / Self Care | Payer: Medicare Other | Source: Ambulatory Visit | Attending: Adult Health | Admitting: Adult Health

## 2019-06-25 ENCOUNTER — Other Ambulatory Visit: Payer: Self-pay

## 2019-06-25 DIAGNOSIS — Z9889 Other specified postprocedural states: Secondary | ICD-10-CM

## 2019-07-26 MED FILL — ANASTROZOLE 1 MG TABLET: 1 | 30 days supply | Qty: 30 | Fill #10

## 2019-08-25 MED FILL — ANASTROZOLE 1 MG TABLET: 1 | 30 days supply | Qty: 30 | Fill #11

## 2019-09-21 MED FILL — ANASTROZOLE 1 MG TABLET: 1 | 30 days supply | Qty: 30 | Fill #12

## 2019-09-21 MED FILL — LOSARTAN-HCTZ 100-25 MG TAB: 100-25 | 30 days supply | Qty: 15 | Fill #10

## 2019-09-28 ENCOUNTER — Other Ambulatory Visit: Payer: Self-pay

## 2019-09-28 DIAGNOSIS — Z170 Estrogen receptor positive status [ER+]: Secondary | ICD-10-CM

## 2019-09-28 DIAGNOSIS — C50411 Malignant neoplasm of upper-outer quadrant of right female breast: Secondary | ICD-10-CM

## 2019-09-30 NOTE — Progress Notes (Signed)
No show

## 2019-10-01 ENCOUNTER — Inpatient Hospital Stay: Payer: Medicare Other

## 2019-10-01 ENCOUNTER — Inpatient Hospital Stay: Payer: Medicare Other | Attending: Oncology | Admitting: Oncology

## 2019-10-01 DIAGNOSIS — Z885 Allergy status to narcotic agent status: Secondary | ICD-10-CM | POA: Insufficient documentation

## 2019-10-01 DIAGNOSIS — Z79811 Long term (current) use of aromatase inhibitors: Secondary | ICD-10-CM | POA: Insufficient documentation

## 2019-10-01 DIAGNOSIS — R232 Flushing: Secondary | ICD-10-CM | POA: Insufficient documentation

## 2019-10-01 DIAGNOSIS — M8580 Other specified disorders of bone density and structure, unspecified site: Secondary | ICD-10-CM | POA: Insufficient documentation

## 2019-10-01 DIAGNOSIS — C50411 Malignant neoplasm of upper-outer quadrant of right female breast: Secondary | ICD-10-CM

## 2019-10-01 DIAGNOSIS — C50312 Malignant neoplasm of lower-inner quadrant of left female breast: Secondary | ICD-10-CM

## 2019-10-01 DIAGNOSIS — I10 Essential (primary) hypertension: Secondary | ICD-10-CM | POA: Insufficient documentation

## 2019-10-01 DIAGNOSIS — Z170 Estrogen receptor positive status [ER+]: Secondary | ICD-10-CM

## 2019-10-01 DIAGNOSIS — Z833 Family history of diabetes mellitus: Secondary | ICD-10-CM | POA: Insufficient documentation

## 2019-10-01 DIAGNOSIS — Z79899 Other long term (current) drug therapy: Secondary | ICD-10-CM | POA: Insufficient documentation

## 2019-10-01 DIAGNOSIS — Z801 Family history of malignant neoplasm of trachea, bronchus and lung: Secondary | ICD-10-CM | POA: Insufficient documentation

## 2019-10-01 DIAGNOSIS — Z923 Personal history of irradiation: Secondary | ICD-10-CM | POA: Insufficient documentation

## 2019-10-01 DIAGNOSIS — C50812 Malignant neoplasm of overlapping sites of left female breast: Secondary | ICD-10-CM | POA: Insufficient documentation

## 2019-10-01 DIAGNOSIS — Z886 Allergy status to analgesic agent status: Secondary | ICD-10-CM | POA: Insufficient documentation

## 2019-10-02 ENCOUNTER — Telehealth: Payer: Self-pay | Admitting: Oncology

## 2019-10-02 ENCOUNTER — Encounter: Payer: Self-pay | Admitting: Oncology

## 2019-10-02 NOTE — Telephone Encounter (Signed)
Rescheduled per 2/9 sch msg, pt req. Called and spoke with [name] (daughter), confirmed 2/24 appt

## 2019-10-16 NOTE — Progress Notes (Signed)
[facility]  [phone] [phone]     ID: Christina [name] DOB: 04-Jul-1946  MR#: [id number]  NTI#:[id number]  Patient Care Team: Girtha Rm, NP-C as PCP - General (Family Medicine) Chalet Kerwin, Virgie Dad, MD as Consulting Physician (Oncology) Eppie Gibson, MD as Attending Physician (Radiation Oncology) Erroll Luna, MD as Consulting Physician (General Surgery) Pavelock, Ralene Bathe, MD as Consulting Physician (Specialist) OTHER MD:  CHIEF COMPLAINT: Estrogen receptor positive breast cancer  CURRENT TREATMENT: anastrozole   INTERVAL HISTORY: Christina [name] returns today for follow-up of her estrogen receptor positive breast cancers.   She continues on anastrozole.  Aside from hot flashes she has tolerated this medication well.  Her most recent bone density screening on 06/16/2018 showed a T-score of -1.8, which is considered osteopenic. She has been taking Vitamin D to treat bone density.  She also walks for exercise, usually about 3 times a week.  Since her last visit, she underwent bilateral diagnostic mammography with tomography at [facility] on 06/25/2019 showing: breast density category C; no evidence of malignancy in either breast.   REVIEW OF SYSTEMS: Praise continues to work full-time.  She is taking appropriate pandemic precautions.  She has not yet received or been scheduled for her COVID-19 vaccine.  A detailed review of systems today was otherwise stable   BREAST CANCER HISTORY: From the original intake note:    Christina [name] had screening mammography (not available for review today) showing a possible mass in the right breast. On 06/09/2015 she underwent unilateral right mammography with tomosynthesis at the [facility] Center. This found the breast density to be category D. The area of distortion seen on the 2-D screening did not persist, but there was a different area of distortion in the outer right breast above the nipple. This was palpable  at the 9:00 position 3 cm from the nipple. Ultrasound showed 2 adjacent irregular hypoechoic masses, the larger containing internal echogenic foci. This measured 1.9 cm. 5 mm lateral to this the second mass measured 1.2 cm maximally. Taken together the 2 masses measured 3.3 cm maximally.  Biopsy of the 2 masses in question 06/13/2015 showed ([id number]) identical invasive ductal carcinomas, estrogen receptor 100% positive, progesterone receptor 100% positive, both with strong staining intensity, with an MIB-1 of 5%, and no HER-2 amplification, the signals ratio being 1.15 and the number per cell 1.95.  The patient's subsequent history is as detailed below   PAST MEDICAL HISTORY: Past Medical History:  Diagnosis Date  . Anemia   . Breast cancer ([place]) 08/28/2015   Bilateral  . Breast cancer of upper-outer quadrant of right female breast ([place]) 06/23/2015  . High cholesterol   . Hx of radiation therapy 11/05/15- 12/16/15   Right Breast and Left Breast  . Hypertension   . Personal history of radiation therapy    Right Breast Cancer  . Vitamin D deficiency 06/29/2018    PAST SURGICAL HISTORY: Past Surgical History:  Procedure Laterality Date  . BREAST LUMPECTOMY Left 08/28/2015  . BREAST LUMPECTOMY Right 08/28/2015  . BREAST LUMPECTOMY WITH NEEDLE LOCALIZATION AND AXILLARY SENTINEL LYMPH NODE BX Bilateral 08/28/2015   Procedure: BILATERAL BREAST LUMPECTOMY WITH BILATERAL  NEEDLE LOCALIZATION AND BILATERAL AXILLARY SENTINEL LYMPH NODE BX;  Surgeon: Erroll Luna, MD;  Location: [facility];  Service: General;  Laterality: Bilateral;    FAMILY HISTORY No family history on file. The patient's parents died when she was very young. She thinks her father may have died from "high blood pressure", and  her mother from cancer, possibly of the uterus. He is alive 3 brothers, 2 sisters. One brother died with lung cancer. He had a history of tobacco abuse. One other brother and one sister have died from  complications of diabetes   GYNECOLOGIC HISTORY:  No LMP recorded. Patient has had a hysterectomy. Menarche age 64, first live birth age 63, the patient is Christina [name]. She does not recall exactly when she stopped having periods. She never took hormone replacement. She never used oral contraceptives.   SOCIAL HISTORY:  Christina [name] works in the [employer] on record. At home she lives with her daughter Christina [name] who works in [employer], and son Christina [name], who has a history of schizophrenia.    ADVANCED DIRECTIVES: Not in place. At the initial clinic visit the patient was given the appropriate forms to complete and notarize at her discretion.   HEALTH MAINTENANCE: Social History   Tobacco Use  . Smoking status: Never Smoker  . Smokeless tobacco: Never Used  Substance Use Topics  . Alcohol use: No  . Drug use: No     Colonoscopy: never  PAP:  Bone density: remote  Lipid panel:  Allergies  Allergen Reactions  . Aspirin Nausea Only  . Onion Nausea And Vomiting    White onion  . Hydrocodone Rash    With itching    Current Outpatient Medications  Medication Sig Dispense Refill  . anastrozole (ARIMIDEX) 1 MG tablet Take 1 tablet (1 mg total) by mouth daily. 90 tablet 6  . fexofenadine (ALLEGRA) 60 MG tablet Take 1 tablet (60 mg total) by mouth 2 (two) times daily. (Patient not taking: Reported on 06/28/2018) 60 tablet 0  . losartan-hydrochlorothiazide (HYZAAR) 50-12.5 MG tablet Take 1 tablet by mouth daily. 90 tablet 4  . simvastatin (ZOCOR) 10 MG tablet TAKE 1 TABLET BY MOUTH AT BEDTIME. 30 tablet 2  . traZODone (DESYREL) 50 MG tablet     . Vitamin D, Ergocalciferol, (DRISDOL) 1.25 MG (50000 UT) CAPS capsule Take 1 capsule (50,000 Units total) by mouth every 7 (seven) days. 8 capsule 0   No current facility-administered medications for this visit.    OBJECTIVE: Middle-aged African-American woman in no acute distress  Vitals:   10/17/19 1123  BP:  130/63  Pulse: 71  Resp: 18  Temp: 98.7 F (37.1 C)  SpO2: 100%     Body mass index is 29.69 kg/m.    ECOG FS:0 - Asymptomatic  Sclerae unicteric Wearing a mask No cervical or supraclavicular adenopathy Lungs no rales or rhonchi Heart regular rate and rhythm Abd soft, nontender, positive bowel sounds MSK no focal spinal tenderness, no upper extremity lymphedema Neuro: nonfocal, well oriented, appropriate affect Breasts: She has had bilateral lumpectomies and radiation bilaterally.  There is no evidence of local recurrence.  Both axillae are benign.   LAB RESULTS:  CMP     Component Value Date/Time   NA 142 10/17/2019 1107   NA 143 06/28/2018 1550   NA 143 09/23/2016 1426   K 4.1 10/17/2019 1107   K 4.5 09/23/2016 1426   CL 106 10/17/2019 1107   CO2 26 10/17/2019 1107   CO2 29 09/23/2016 1426   GLUCOSE 98 10/17/2019 1107   GLUCOSE 95 09/23/2016 1426   BUN 24 (H) 10/17/2019 1107   BUN 21 06/28/2018 1550   BUN 21.8 09/23/2016 1426   CREATININE 1.00 10/17/2019 1107   CREATININE 1.1 09/23/2016 1426   CALCIUM 9.3 10/17/2019 1107  CALCIUM 10.0 09/23/2016 1426   PROT 7.3 10/17/2019 1107   PROT 6.8 06/28/2018 1550   PROT 7.4 09/23/2016 1426   ALBUMIN 3.9 10/17/2019 1107   ALBUMIN 4.3 06/28/2018 1550   ALBUMIN 4.0 09/23/2016 1426   AST 19 10/17/2019 1107   AST 23 09/23/2016 1426   ALT 16 10/17/2019 1107   ALT 19 09/23/2016 1426   ALKPHOS 119 10/17/2019 1107   ALKPHOS 151 (H) 09/23/2016 1426   BILITOT 0.6 10/17/2019 1107   BILITOT 0.58 09/23/2016 1426   GFRNONAA 57 (L) 10/17/2019 1107   GFRAA >60 10/17/2019 1107    INo results found for: SPEP, UPEP  Lab Results  Component Value Date   WBC 3.9 (L) 10/17/2019   NEUTROABS 1.9 10/17/2019   HGB 10.4 (L) 10/17/2019   HCT 33.3 (L) 10/17/2019   MCV 86.3 10/17/2019   PLT 212 10/17/2019      Chemistry      Component Value Date/Time   NA 142 10/17/2019 1107   NA 143 06/28/2018 1550   NA 143 09/23/2016 1426   K  4.1 10/17/2019 1107   K 4.5 09/23/2016 1426   CL 106 10/17/2019 1107   CO2 26 10/17/2019 1107   CO2 29 09/23/2016 1426   BUN 24 (H) 10/17/2019 1107   BUN 21 06/28/2018 1550   BUN 21.8 09/23/2016 1426   CREATININE 1.00 10/17/2019 1107   CREATININE 1.1 09/23/2016 1426      Component Value Date/Time   CALCIUM 9.3 10/17/2019 1107   CALCIUM 10.0 09/23/2016 1426   ALKPHOS 119 10/17/2019 1107   ALKPHOS 151 (H) 09/23/2016 1426   AST 19 10/17/2019 1107   AST 23 09/23/2016 1426   ALT 16 10/17/2019 1107   ALT 19 09/23/2016 1426   BILITOT 0.6 10/17/2019 1107   BILITOT 0.58 09/23/2016 1426      No results found for: LABCA2  No components found for: LABCA125  No results for input(s): INR in the last 168 hours.  Urinalysis    Component Value Date/Time   COLORURINE yellow 04/01/2010 0000   APPEARANCEUR Clear 04/01/2010 0000   LABSPEC 1.010 10/25/2017 2022   PHURINE 6.5 10/25/2017 2022   GLUCOSEU NEGATIVE 10/25/2017 2022   HGBUR TRACE (A) 10/25/2017 2022   HGBUR negative 04/01/2010 0000   BILIRUBINUR NEGATIVE 10/25/2017 2022   KETONESUR NEGATIVE 10/25/2017 2022   PROTEINUR NEGATIVE 10/25/2017 2022   UROBILINOGEN 0.2 10/25/2017 2022   NITRITE NEGATIVE 10/25/2017 2022   LEUKOCYTESUR SMALL (A) 10/25/2017 2022    STUDIES: No results found.   ASSESSMENT: 74 y.o. [place] woman status post right breastUpper outer quadrant biopsy 2 on 06/13/2015 for morphologically identical invasive ductal carcinomas measuring 1.9 and 1.2 cm, grade 2, estrogen and progesterone receptor strongly positive, HER-2 not amplified, with an MIB-1 of 5%  (1) bilateral lumpectomies 08/28/2015 showed  (a) on the right, apT2 pN0,stage IIA invasive ductal carcinoma, grade 1, with repeat HER-2 again negative  (b) on the left, apT1c pN0, stage IA invasive ductal carcinoma, grade 1, with repeat HER-2 again negative  (2) Oncotype score of 5 predicts a risk of outside the breast recurrence within 10 years of 5%  if the patient's only systemic therapy is tamoxifen for 5 years. It also predicts no significant benefit from chemotherapy   (3) adjuvant radiation completed 11/05/2015 - 12/16/2015  Site/dose:    - Right Breast / 46 Gy in 23 fractions  - Right Breast Boost / 14 Gy in 7 fractions  - Left Breast /  46 Gy in 23 fractions  - Left Breast Boost / 14 Gy in 7 fractions  (4) anastrozole started 12/29/2015  (a) Bone density 05/12/2016 shows a T score of -1.7  (b) Bone density 06/16/2018 shows a T-score of -1.8   PLAN: Christina [name] is now 4 years out from definitive surgery for her breast cancer with no evidence of disease recurrence.  This is very favorable.  She is tolerating anastrozole well and the plan will be to continue that a total of 5 years which will take [place] through April 2022.  She wanted to know if she was gaining weight.  She was 173 pounds when we started and is currently 181 pounds.  I urged her to sign up to get COVID-19 vaccine as soon as she can.  We discussed the safety aspects involved.  I also encouraged her to continue to walk for exercise.  She will see me again in April 2022 and that will be her "graduation visit".  Total encounter time 25 minutes.*  Christina [name], Virgie Dad, MD  10/17/19 12:55 PM Medical Oncology and Hematology [facility] [place], [place] [address] Tel. [phone]    Fax. [phone]   I, Wilburn Mylar, am acting as scribe for Dr. Virgie Dad. Christina [name].  I, Lurline Del MD, have reviewed the above documentation for accuracy and completeness, and I agree with the above.    *Total Encounter Time as defined by the Centers for Medicare and Medicaid Services includes, in addition to the face-to-face time of a patient visit (documented in the note above) non-face-to-face time: obtaining and reviewing outside history, ordering and reviewing medications, tests or procedures, care coordination (communications with other health care  professionals or caregivers) and documentation in the medical record.

## 2019-10-17 ENCOUNTER — Inpatient Hospital Stay: Payer: Medicare Other

## 2019-10-17 ENCOUNTER — Other Ambulatory Visit: Payer: Self-pay

## 2019-10-17 ENCOUNTER — Inpatient Hospital Stay (HOSPITAL_BASED_OUTPATIENT_CLINIC_OR_DEPARTMENT_OTHER): Payer: Medicare Other | Admitting: Oncology

## 2019-10-17 VITALS — BP 130/63 | HR 71 | Temp 98.7°F | Resp 18 | Ht 65.5 in | Wt 181.2 lb

## 2019-10-17 DIAGNOSIS — Z833 Family history of diabetes mellitus: Secondary | ICD-10-CM | POA: Diagnosis not present

## 2019-10-17 DIAGNOSIS — Z170 Estrogen receptor positive status [ER+]: Secondary | ICD-10-CM

## 2019-10-17 DIAGNOSIS — C50312 Malignant neoplasm of lower-inner quadrant of left female breast: Secondary | ICD-10-CM | POA: Diagnosis not present

## 2019-10-17 DIAGNOSIS — C50411 Malignant neoplasm of upper-outer quadrant of right female breast: Secondary | ICD-10-CM

## 2019-10-17 DIAGNOSIS — Z886 Allergy status to analgesic agent status: Secondary | ICD-10-CM | POA: Diagnosis not present

## 2019-10-17 DIAGNOSIS — C50812 Malignant neoplasm of overlapping sites of left female breast: Secondary | ICD-10-CM | POA: Diagnosis present

## 2019-10-17 DIAGNOSIS — Z923 Personal history of irradiation: Secondary | ICD-10-CM | POA: Diagnosis not present

## 2019-10-17 DIAGNOSIS — R232 Flushing: Secondary | ICD-10-CM | POA: Diagnosis not present

## 2019-10-17 DIAGNOSIS — M8580 Other specified disorders of bone density and structure, unspecified site: Secondary | ICD-10-CM | POA: Diagnosis not present

## 2019-10-17 DIAGNOSIS — I10 Essential (primary) hypertension: Secondary | ICD-10-CM | POA: Diagnosis not present

## 2019-10-17 DIAGNOSIS — Z79899 Other long term (current) drug therapy: Secondary | ICD-10-CM | POA: Diagnosis not present

## 2019-10-17 DIAGNOSIS — Z801 Family history of malignant neoplasm of trachea, bronchus and lung: Secondary | ICD-10-CM | POA: Diagnosis not present

## 2019-10-17 DIAGNOSIS — Z885 Allergy status to narcotic agent status: Secondary | ICD-10-CM | POA: Diagnosis not present

## 2019-10-17 DIAGNOSIS — Z79811 Long term (current) use of aromatase inhibitors: Secondary | ICD-10-CM | POA: Diagnosis not present

## 2019-10-17 LAB — CMP (CANCER CENTER ONLY)
ALT: 16 U/L (ref 0–44)
AST: 19 U/L (ref 15–41)
Albumin: 3.9 g/dL (ref 3.5–5.0)
Alkaline Phosphatase: 119 U/L (ref 38–126)
Anion gap: 10 (ref 5–15)
BUN: 24 mg/dL — ABNORMAL HIGH (ref 8–23)
CO2: 26 mmol/L (ref 22–32)
Calcium: 9.3 mg/dL (ref 8.9–10.3)
Chloride: 106 mmol/L (ref 98–111)
Creatinine: 1 mg/dL (ref 0.44–1.00)
GFR, Est AFR Am: >60 mL/min (ref >60)
GFR, Estimated: 57 mL/min — ABNORMAL LOW (ref >60)
Glucose, Bld: 98 mg/dL (ref 70–99)
Potassium: 4.1 mmol/L (ref 3.5–5.1)
Sodium: 142 mmol/L (ref 135–145)
Total Bilirubin: 0.6 mg/dL (ref 0.3–1.2)
Total Protein: 7.3 g/dL (ref 6.5–8.1)

## 2019-10-17 LAB — CBC WITH DIFFERENTIAL (CANCER CENTER ONLY)
Abs Immature Granulocytes: 0.01 10*3/uL (ref 0.00–0.07)
Basophils Absolute: 0 10*3/uL (ref 0.0–0.1)
Basophils Relative: 1 %
Eosinophils Absolute: 0.1 10*3/uL (ref 0.0–0.5)
Eosinophils Relative: 3 %
HCT: 33.3 % — ABNORMAL LOW (ref 36.0–46.0)
Hemoglobin: 10.4 g/dL — ABNORMAL LOW (ref 12.0–15.0)
Immature Granulocytes: 0 %
Lymphocytes Relative: 37 %
Lymphs Abs: 1.4 10*3/uL (ref 0.7–4.0)
MCH: 26.9 pg (ref 26.0–34.0)
MCHC: 31.2 g/dL (ref 30.0–36.0)
MCV: 86.3 fL (ref 80.0–100.0)
Monocytes Absolute: 0.4 10*3/uL (ref 0.1–1.0)
Monocytes Relative: 11 %
Neutro Abs: 1.9 10*3/uL (ref 1.7–7.7)
Neutrophils Relative %: 48 %
Platelet Count: 212 10*3/uL (ref 150–400)
RBC: 3.86 MIL/uL — ABNORMAL LOW (ref 3.87–5.11)
RDW: 13 % (ref 11.5–15.5)
WBC Count: 3.9 10*3/uL — ABNORMAL LOW (ref 4.0–10.5)
nRBC: 0 % (ref 0.0–0.2)

## 2019-10-17 MED ORDER — ANASTROZOLE 1 MG PO TABS: 1.0000 mg | ORAL_TABLET | Freq: Every day | ORAL | 6 refills | Status: DC

## 2019-10-18 ENCOUNTER — Telehealth: Payer: Self-pay | Admitting: Oncology

## 2019-10-18 NOTE — Telephone Encounter (Signed)
I left a message regarding schedule  

## 2019-10-27 ENCOUNTER — Other Ambulatory Visit: Payer: Self-pay | Admitting: Oncology

## 2019-10-27 DIAGNOSIS — C50411 Malignant neoplasm of upper-outer quadrant of right female breast: Secondary | ICD-10-CM

## 2019-10-27 DIAGNOSIS — C50312 Malignant neoplasm of lower-inner quadrant of left female breast: Secondary | ICD-10-CM

## 2019-10-29 MED FILL — ANASTROZOLE 1 MG TABLET: 1 | 30 days supply | Qty: 30 | Fill #0

## 2019-10-29 MED FILL — LOSARTAN-HCTZ 100-25 MG TAB: 100-25 | 30 days supply | Qty: 15 | Fill #0

## 2019-11-17 ENCOUNTER — Other Ambulatory Visit: Payer: Self-pay | Admitting: Oncology

## 2019-11-22 MED FILL — LOSARTAN-HCTZ 50-12.5 MG TA: 50-12.5 | 30 days supply | Qty: 30 | Fill #0

## 2019-11-24 ENCOUNTER — Ambulatory Visit (HOSPITAL_COMMUNITY)
Admission: EM | Admit: 2019-11-24 | Discharge: 2019-11-24 | Disposition: A | Discharge status: Home / Self Care | Payer: Medicare Other

## 2019-11-24 ENCOUNTER — Other Ambulatory Visit: Payer: Self-pay

## 2019-11-24 ENCOUNTER — Encounter (HOSPITAL_COMMUNITY): Payer: Self-pay

## 2019-11-24 DIAGNOSIS — I10 Essential (primary) hypertension: Secondary | ICD-10-CM | POA: Diagnosis not present

## 2019-11-24 NOTE — ED Provider Notes (Signed)
[place]    CSN: [id number] Arrival date & time: 11/24/19  1424      History   Chief Complaint Chief Complaint  Patient presents with  . Hypertension    HPI Christina [name] is a 74 y.o. female.   Christina [name] presents with concerns about her blood pressure. States she has felt that despite taking her medication it has not improved. She endorses taking her BP medication daily and not missing doses. Denies change in diet. Walks for exercises approximately 3-4 times a week. No headache. No chest pain , no dizziness, no vision change, no leg swelling. No acute complaints today. History of breast cancer which has been treated, follows with oncology regularly. Per chart review was seen by her new PCP yesterday to establish care, with noted bp of 182/100 at that visit. Her BP medication was refilled yesterday, she states she has taken her medication today. She does not check her BP at home.     ROS per HPI, negative if not otherwise mentioned.      Past Medical History:  Diagnosis Date  . Anemia   . Breast cancer ([place]) 08/28/2015   Bilateral  . Breast cancer of upper-outer quadrant of right female breast ([place]) 06/23/2015  . High cholesterol   . Hx of radiation therapy 11/05/15- 12/16/15   Right Breast and Left Breast  . Hypertension   . Personal history of radiation therapy    Right Breast Cancer  . Vitamin D deficiency 06/29/2018    Patient Active Problem List   Diagnosis Date Noted  . Vitamin D deficiency 06/29/2018  . Malignant neoplasm of lower-inner quadrant of left breast in female, estrogen receptor positive ([place]) 10/20/2015  . Malignant neoplasm of upper-outer quadrant of right breast in female, estrogen receptor positive ([place]) 06/23/2015  . CONSTIPATION 04/01/2010  . PALPITATIONS 04/01/2010  . URINALYSIS, ABNORMAL 04/01/2010  . ANEMIA 05/22/2009  . DENTAL CARIES 04/21/2009  . Elevated LDL cholesterol level 03/18/2008  . Essential hypertension  08/23/2006    Past Surgical History:  Procedure Laterality Date  . BREAST LUMPECTOMY Left 08/28/2015  . BREAST LUMPECTOMY Right 08/28/2015  . BREAST LUMPECTOMY WITH NEEDLE LOCALIZATION AND AXILLARY SENTINEL LYMPH NODE BX Bilateral 08/28/2015   Procedure: BILATERAL BREAST LUMPECTOMY WITH BILATERAL  NEEDLE LOCALIZATION AND BILATERAL AXILLARY SENTINEL LYMPH NODE BX;  Surgeon: Erroll Luna, MD;  Location: [facility];  Service: General;  Laterality: Bilateral;    OB History   No obstetric history on file.      Home Medications    Prior to Admission medications   Medication Sig Start Date End Date Taking? Authorizing Provider  anastrozole (ARIMIDEX) 1 MG tablet TAKE 1 TABLET BY MOUTH ONCE A DAY 10/29/19   Magrinat, Virgie Dad, MD  fexofenadine (ALLEGRA) 60 MG tablet Take 1 tablet (60 mg total) by mouth 2 (two) times daily. Patient not taking: Reported on 06/28/2018 12/11/17   Tereasa Coop, PA-C  losartan-hydrochlorothiazide (HYZAAR) 100-25 MG tablet TAKE 1/2 TABLET BY MOUTH ONCE A DAY 11/19/19   Magrinat, Virgie Dad, MD  simvastatin (ZOCOR) 10 MG tablet TAKE 1 TABLET BY MOUTH AT BEDTIME. 09/29/18   Henson, Vickie L, NP-C  traZODone (DESYREL) 50 MG tablet  09/29/17   [provider]  Vitamin D, Ergocalciferol, (DRISDOL) 1.25 MG (50000 UT) CAPS capsule Take 1 capsule (50,000 Units total) by mouth every 7 (seven) days. 06/29/18   Girtha Rm, NP-C    Family History No family history on file.  Social History Social History   Tobacco Use  . Smoking status: Never Smoker  . Smokeless tobacco: Never Used  Substance Use Topics  . Alcohol use: No  . Drug use: No     Allergies   Aspirin, Onion, and Hydrocodone   Review of Systems Review of Systems   Physical Exam Triage Vital Signs ED Triage Vitals  Enc Vitals Group     BP 11/24/19 1501 (!) 153/86     Pulse Rate 11/24/19 1501 68     Resp 11/24/19 1501 18     Temp 11/24/19 1501 98.7 F (37.1 C)     Temp Source 11/24/19 1501  Oral     SpO2 11/24/19 1501 100 %     Weight --      Height --      Head Circumference --      Peak Flow --      Pain Score 11/24/19 1459 0     Pain Loc --      Pain Edu? --      Excl. in [place]? --    No data found.  Updated Vital Signs BP (!) 153/86 (BP Location: Right Arm)   Pulse 68   Temp 98.7 F (37.1 C) (Oral)   Resp 18   SpO2 100%    Physical Exam Constitutional:      General: She is not in acute distress.    Appearance: She is well-developed.  Cardiovascular:     Rate and Rhythm: Normal rate.  Pulmonary:     Effort: Pulmonary effort is normal.  Musculoskeletal:     Right lower leg: No edema.     Left lower leg: No edema.  Skin:    General: Skin is warm and dry.  Neurological:     Mental Status: She is alert and oriented to person, place, and time.      UC Treatments / Results  Labs (all labs ordered are listed, but only abnormal results are displayed) Labs Reviewed - No data to display  EKG   Radiology No results found.  Procedures Procedures (including critical care time)  Medications Ordered in UC Medications - No data to display  Initial Impression / Assessment and Plan / UC Course  I have reviewed the triage vital signs and the nursing notes.  Pertinent labs & imaging results that were available during my care of the patient were reviewed by me and considered in my medical decision making (see chart for details).     Saw her PCP yesterday to establish care and manage medications, her BP medications had been refilled. bp is improved today from yesterday. Per recent visits in chart review this remains slightly higher than baseline for her. No acute complaints. No changes to medications today, encouraged to follow up with pcp, may start with nurse visit for check of bp and then with PCP for recheck to determine if medication adjustments are necessary. BP management discussed. Return precautions provided. Patient verbalized understanding and  agreeable to plan.  Ambulatory out of clinic without difficulty.    Final Clinical Impressions(s) / UC Diagnoses   Final diagnoses:  Hypertension, unspecified type     Discharge Instructions     Your blood pressure is slightly elevated today, but overall not at a scary level of concern.  I would like you to follow up with your primary care provider for a recheck of your blood pressure in the next month. Call on Monday to schedule an appointment. You may also  request an earlier "Nurse Visit" for a blood pressure check, so your primary care provider has a documented trend of your blood pressures.  Please see provided information about managing your blood pressure- regular exercise, stress management, and healthy diet can help.  Please seek evaluation if you develop headache, vision changes, chest pain, or shortness of breath .    ED Prescriptions    None     PDMP not reviewed this encounter.   Zigmund Gottron, NP 11/24/19 [phone]

## 2019-11-24 NOTE — Discharge Instructions (Signed)
Your blood pressure is slightly elevated today, but overall not at a scary level of concern.  I would like you to follow up with your primary care provider for a recheck of your blood pressure in the next month. Call on Monday to schedule an appointment. You may also request an earlier "Nurse Visit" for a blood pressure check, so your primary care provider has a documented trend of your blood pressures.  Please see provided information about managing your blood pressure- regular exercise, stress management, and healthy diet can help.  Please seek evaluation if you develop headache, vision changes, chest pain, or shortness of breath .

## 2019-11-24 NOTE — ED Triage Notes (Signed)
Patient reports she sometimes has headaches and "feels like her blood pressure is high".

## 2019-11-27 ENCOUNTER — Other Ambulatory Visit: Payer: Self-pay | Admitting: *Deleted

## 2019-11-27 DIAGNOSIS — C50411 Malignant neoplasm of upper-outer quadrant of right female breast: Secondary | ICD-10-CM

## 2019-11-27 DIAGNOSIS — C50312 Malignant neoplasm of lower-inner quadrant of left female breast: Secondary | ICD-10-CM

## 2019-11-27 MED ORDER — ANASTROZOLE 1 MG PO TABS: 1.0000 mg | ORAL_TABLET | Freq: Every day | ORAL | 0 refills | Status: DC

## 2019-11-27 MED FILL — ANASTROZOLE 1 MG TABLET: 1 | 90 days supply | Qty: 90 | Fill #0

## 2019-12-22 MED FILL — LOSARTAN-HCTZ 50-12.5 MG TA: 50-12.5 | 90 days supply | Qty: 90 | Fill #0

## 2020-02-08 ENCOUNTER — Other Ambulatory Visit: Payer: Self-pay | Admitting: Oncology

## 2020-02-08 ENCOUNTER — Other Ambulatory Visit: Payer: Self-pay | Admitting: *Deleted

## 2020-02-08 DIAGNOSIS — C50411 Malignant neoplasm of upper-outer quadrant of right female breast: Secondary | ICD-10-CM

## 2020-02-08 DIAGNOSIS — C50312 Malignant neoplasm of lower-inner quadrant of left female breast: Secondary | ICD-10-CM

## 2020-02-08 MED ORDER — ANASTROZOLE 1 MG PO TABS: 1.0000 mg | ORAL_TABLET | Freq: Every day | ORAL | 3 refills | Status: DC

## 2020-02-23 MED FILL — ANASTROZOLE 1 MG TABLET: 1 | 90 days supply | Qty: 90 | Fill #0

## 2020-02-29 ENCOUNTER — Other Ambulatory Visit (HOSPITAL_COMMUNITY): Payer: Self-pay | Admitting: Physician Assistant

## 2020-03-22 MED FILL — LOSARTAN-HCTZ 50-12.5 MG TA: 50-12.5 | 30 days supply | Qty: 30 | Fill #0

## 2020-04-19 MED FILL — LOSARTAN-HCTZ 50-12.5 MG TA: 50-12.5 | 90 days supply | Qty: 90 | Fill #1

## 2020-05-17 MED FILL — ANASTROZOLE 1 MG TABLET: 1 | 90 days supply | Qty: 90 | Fill #1

## 2020-05-23 ENCOUNTER — Other Ambulatory Visit: Payer: Self-pay

## 2020-05-23 ENCOUNTER — Ambulatory Visit (HOSPITAL_COMMUNITY)
Admission: EM | Admit: 2020-05-23 | Discharge: 2020-05-23 | Disposition: A | Discharge status: Home / Self Care | Payer: Medicare Other | Attending: Family Medicine | Admitting: Family Medicine

## 2020-05-23 ENCOUNTER — Other Ambulatory Visit (HOSPITAL_COMMUNITY): Payer: Self-pay | Admitting: Family Medicine

## 2020-05-23 ENCOUNTER — Encounter (HOSPITAL_COMMUNITY): Payer: Self-pay

## 2020-05-23 DIAGNOSIS — M25561 Pain in right knee: Secondary | ICD-10-CM | POA: Diagnosis not present

## 2020-05-23 DIAGNOSIS — G8929 Other chronic pain: Secondary | ICD-10-CM | POA: Diagnosis not present

## 2020-05-23 DIAGNOSIS — R21 Rash and other nonspecific skin eruption: Secondary | ICD-10-CM

## 2020-05-23 DIAGNOSIS — E559 Vitamin D deficiency, unspecified: Secondary | ICD-10-CM

## 2020-05-23 MED ORDER — MELOXICAM 7.5 MG PO TABS: 7.5000 mg | ORAL_TABLET | Freq: Every day | ORAL | 0 refills | Status: DC

## 2020-05-23 MED ORDER — TRIAMCINOLONE ACETONIDE 0.1 % EX OINT: 1.0000 "application " | TOPICAL_OINTMENT | Freq: Two times a day (BID) | CUTANEOUS | 1 refills | Status: DC

## 2020-05-23 NOTE — ED Provider Notes (Signed)
[place]    CSN: [id number] Arrival date & time: 05/23/20  1800      History   Chief Complaint Chief Complaint  Patient presents with  . Knee Pain    right knee  . Rash    HPI Christina [name] is a 74 y.o. female.   HPI Patient is here for knee pain.  She has chronic knee pain.  She states she is been trying to walk more because of her hypertension.  It makes her knee hurt more.  She wonders if there is a medication she can take for her knee.  I did check her laboratory test.  Her kidney function is normal.  She is not diabetic.  I am going to give her a limited number of Mobic to take daily for her knee pain. Patient has a recurring rash.  It is itchy and red.  She states that she would like an ointment for this.  She states that she has had a prescription for some before.  I will give her triamcinolone ointment. I reviewed her chart.  She had a vitamin D deficiency.  She tells me her balance is poor.  She also has a lot of aches and pains.  She is not on vitamin D replacement.  She is 30, breast cancer survivor on Arimidex, has osteopenia on her bone density.  I told her that she needs to take calcium and vitamin D, with vitamin D at least 2000 units a day.    Past Medical History:  Diagnosis Date  . Anemia   . Breast cancer ([place]) 08/28/2015   Bilateral  . Breast cancer of upper-outer quadrant of right female breast ([place]) 06/23/2015  . High cholesterol   . Hx of radiation therapy 11/05/15- 12/16/15   Right Breast and Left Breast  . Hypertension   . Personal history of radiation therapy    Right Breast Cancer  . Vitamin D deficiency 06/29/2018    Patient Active Problem List   Diagnosis Date Noted  . Vitamin D deficiency 06/29/2018  . Malignant neoplasm of lower-inner quadrant of left breast in female, estrogen receptor positive ([place]) 10/20/2015  . Malignant neoplasm of upper-outer quadrant of right breast in female, estrogen receptor positive ([place]) 06/23/2015   . CONSTIPATION 04/01/2010  . PALPITATIONS 04/01/2010  . URINALYSIS, ABNORMAL 04/01/2010  . ANEMIA 05/22/2009  . DENTAL CARIES 04/21/2009  . Elevated LDL cholesterol level 03/18/2008  . Essential hypertension 08/23/2006    Past Surgical History:  Procedure Laterality Date  . BREAST LUMPECTOMY Left 08/28/2015  . BREAST LUMPECTOMY Right 08/28/2015  . BREAST LUMPECTOMY WITH NEEDLE LOCALIZATION AND AXILLARY SENTINEL LYMPH NODE BX Bilateral 08/28/2015   Procedure: BILATERAL BREAST LUMPECTOMY WITH BILATERAL  NEEDLE LOCALIZATION AND BILATERAL AXILLARY SENTINEL LYMPH NODE BX;  Surgeon: Erroll Luna, MD;  Location: [facility];  Service: General;  Laterality: Bilateral;    OB History   No obstetric history on file.      Home Medications    Prior to Admission medications   Medication Sig Start Date End Date Taking? Authorizing Provider  anastrozole (ARIMIDEX) 1 MG tablet Take 1 tablet (1 mg total) by mouth daily. 02/08/20   Magrinat, Virgie Dad, MD  fexofenadine (ALLEGRA) 60 MG tablet Take 1 tablet (60 mg total) by mouth 2 (two) times daily. Patient not taking: Reported on 06/28/2018 12/11/17   Tereasa Coop, PA-C  losartan-hydrochlorothiazide (HYZAAR) 100-25 MG tablet TAKE 1/2 TABLET BY MOUTH ONCE A DAY 11/19/19   Magrinat,  Virgie Dad, MD  meloxicam (MOBIC) 7.5 MG tablet Take 1 tablet (7.5 mg total) by mouth daily. Take with food 05/23/20   Raylene Everts, MD  simvastatin (ZOCOR) 10 MG tablet TAKE 1 TABLET BY MOUTH AT BEDTIME. 09/29/18   Henson, Vickie L, NP-C  traZODone (DESYREL) 50 MG tablet  09/29/17   [provider]  triamcinolone ointment (KENALOG) 0.1 % Apply 1 application topically 2 (two) times daily. 05/23/20   Raylene Everts, MD  Vitamin D, Ergocalciferol, (DRISDOL) 1.25 MG (50000 UT) CAPS capsule Take 1 capsule (50,000 Units total) by mouth every 7 (seven) days. 06/29/18   Girtha Rm, NP-C    Family History History reviewed. No pertinent family history.  Social  History Social History   Tobacco Use  . Smoking status: Never Smoker  . Smokeless tobacco: Never Used  Substance Use Topics  . Alcohol use: No  . Drug use: No     Allergies   Onion, Aspirin, and Hydrocodone   Review of Systems Review of Systems See HPI  Physical Exam Triage Vital Signs ED Triage Vitals  Enc Vitals Group     BP 05/23/20 1906 (!) 155/64     Pulse Rate 05/23/20 1906 71     Resp 05/23/20 1906 18     Temp 05/23/20 1906 98.5 F (36.9 C)     Temp Source 05/23/20 1906 Oral     SpO2 05/23/20 1906 100 %     Weight --      Height --      Head Circumference --      Peak Flow --      Pain Score 05/23/20 1907 6     Pain Loc --      Pain Edu? --      Excl. in [place]? --    No data found.  Updated Vital Signs BP (!) 155/64 (BP Location: Right Arm)   Pulse 71   Temp 98.5 F (36.9 C) (Oral)   Resp 18   SpO2 100%       Physical Exam Constitutional:      General: She is not in acute distress.    Appearance: Normal appearance. She is well-developed.  HENT:     Head: Normocephalic and atraumatic.     Mouth/Throat:     Mouth: Mucous membranes are moist.  Eyes:     Conjunctiva/sclera: Conjunctivae normal.     Pupils: Pupils are equal, round, and reactive to light.  Cardiovascular:     Rate and Rhythm: Normal rate and regular rhythm.     Heart sounds: Normal heart sounds.  Pulmonary:     Effort: Pulmonary effort is normal. No respiratory distress.     Breath sounds: Normal breath sounds.  Abdominal:     Palpations: Abdomen is soft.  Musculoskeletal:        General: Normal range of motion.     Cervical back: Normal range of motion.     Comments: Right knee has some warmth.  No effusion.  Mild patellofemoral crepitus.  No tenderness around the joint line  Skin:    General: Skin is warm and dry.     Comments: Nonspecific rash on the right wrist is noted.  It is in a healing phase with only some mild erythema remaining  Neurological:     General: No  focal deficit present.     Mental Status: She is alert.  Psychiatric:        Behavior: Behavior normal.  UC Treatments / Results  Labs (all labs ordered are listed, but only abnormal results are displayed) Labs Reviewed - No data to display  EKG   Radiology No results found.  Procedures Procedures (including critical care time)  Medications Ordered in UC Medications - No data to display  Initial Impression / Assessment and Plan / UC Course  I have reviewed the triage vital signs and the nursing notes.  Pertinent labs & imaging results that were available during my care of the patient were reviewed by me and considered in my medical decision making (see chart for details).    See HPI.  Mobic for the knee pain.  Continue walking.  Triamcinolone for the nonspecific rash.  Use sparingly.  Adequate calcium and vitamin D is emphasized to patient.  Follow-up with primary care  Final Clinical Impressions(s) / UC Diagnoses   Final diagnoses:  Chronic pain of right knee  Rash and nonspecific skin eruption  Vitamin D deficiency     Discharge Instructions     Take the meloxicam once  day with food This is for knee arthritis I have prescribed an ointment for your skin Your lab tests show a vitamin D deficiency- you need to take vitamin D3 2000 IU a day.  This is an over the counter medication    ED Prescriptions    Medication Sig Dispense Auth. Provider   meloxicam (MOBIC) 7.5 MG tablet Take 1 tablet (7.5 mg total) by mouth daily. Take with food 30 tablet Raylene Everts, MD   triamcinolone ointment (KENALOG) 0.1 % Apply 1 application topically 2 (two) times daily. 15 g Raylene Everts, MD     PDMP not reviewed this encounter.   Raylene Everts, MD 05/23/20 2108

## 2020-05-23 NOTE — Discharge Instructions (Addendum)
Take the meloxicam once  day with food This is for knee arthritis I have prescribed an ointment for your skin Your lab tests show a vitamin D deficiency- you need to take vitamin D3 2000 IU a day.  This is an over the counter medication

## 2020-05-23 NOTE — ED Triage Notes (Signed)
Pt c/o right knee pain, rash and has been using topical cream on rash. Here desiring better cream tx for rash and pain control.

## 2020-05-30 ENCOUNTER — Other Ambulatory Visit: Payer: Self-pay | Admitting: Adult Health

## 2020-05-30 DIAGNOSIS — Z853 Personal history of malignant neoplasm of breast: Secondary | ICD-10-CM

## 2020-06-07 ENCOUNTER — Other Ambulatory Visit: Payer: Self-pay | Admitting: Family Medicine

## 2020-06-07 MED FILL — MELOXICAM 7.5 MG TABLET: 7.5 | 30 days supply | Qty: 30 | Fill #0

## 2020-06-07 MED FILL — TRIAMCINOLONE 0.1% OINTMENT: 0.1 | 7 days supply | Qty: 15 | Fill #0

## 2020-06-27 ENCOUNTER — Other Ambulatory Visit: Payer: Self-pay

## 2020-06-27 ENCOUNTER — Ambulatory Visit
Admission: RE | Admit: 2020-06-27 | Discharge: 2020-06-27 | Disposition: A | Discharge status: Home / Self Care | Payer: Medicare Other | Source: Ambulatory Visit | Attending: Adult Health | Admitting: Adult Health

## 2020-06-27 DIAGNOSIS — Z853 Personal history of malignant neoplasm of breast: Secondary | ICD-10-CM

## 2020-07-22 MED FILL — LOSARTAN-HCTZ 50-12.5 MG TA: 50-12.5 | 30 days supply | Qty: 30 | Fill #2

## 2020-08-01 MED FILL — MELOXICAM 7.5 MG TABLET: 7.5 | 30 days supply | Qty: 30 | Fill #0

## 2020-08-22 MED FILL — ANASTROZOLE 1 MG TABLET: 1 | 90 days supply | Qty: 90 | Fill #2

## 2020-08-22 MED FILL — LOSARTAN-HCTZ 50-12.5 MG TA: 50-12.5 | 30 days supply | Qty: 30 | Fill #3

## 2020-09-01 ENCOUNTER — Ambulatory Visit (HOSPITAL_COMMUNITY)
Admission: EM | Admit: 2020-09-01 | Discharge: 2020-09-01 | Disposition: A | Discharge status: Home / Self Care | Payer: Medicare Other | Attending: Family Medicine | Admitting: Family Medicine

## 2020-09-01 ENCOUNTER — Encounter (HOSPITAL_COMMUNITY): Payer: Self-pay

## 2020-09-01 ENCOUNTER — Other Ambulatory Visit: Payer: Self-pay

## 2020-09-01 DIAGNOSIS — J069 Acute upper respiratory infection, unspecified: Secondary | ICD-10-CM

## 2020-09-01 DIAGNOSIS — U071 COVID-19: Secondary | ICD-10-CM | POA: Insufficient documentation

## 2020-09-01 DIAGNOSIS — R630 Anorexia: Secondary | ICD-10-CM | POA: Diagnosis present

## 2020-09-01 NOTE — ED Triage Notes (Signed)
Pt presents with cough and lowe appetite since yesterday. Denies sob, nasal congestion.

## 2020-09-02 LAB — SARS CORONAVIRUS 2 (TAT 6-24 HRS): SARS Coronavirus 2: POSITIVE — AB

## 2020-09-03 ENCOUNTER — Telehealth: Payer: Self-pay

## 2020-09-03 NOTE — Telephone Encounter (Signed)
Attempted to reach pt. In regard to COVID 19 treatments. Message left with call back number [phone].

## 2020-09-04 ENCOUNTER — Telehealth: Payer: Self-pay | Admitting: Physician Assistant

## 2020-09-04 NOTE — Telephone Encounter (Signed)
Pt returned call regarding a message that was left on her phone saying to call back regarding her positive Covis and treatment plan  CB#  [phone]

## 2020-09-04 NOTE — Telephone Encounter (Signed)
Called to discuss with patient about COVID-19 symptoms and the use of one of the available treatments for those with mild to moderate Covid symptoms and at a high risk of hospitalization.  Pt appears to qualify for outpatient treatment due to co-morbid conditions and/or a member of an at-risk group in accordance with the FDA Emergency Use Authorization.    Symptom onset: unknown, per ER notes, sx may have started 1/9. + 1/10 Vaccinated: unknown Booster? unknown Immunocompromised?  Qualifiers: age, HTN, breast cancer  Unable to reach pt - left message with hotline number,.  Christina [name]

## 2020-09-04 NOTE — ED Provider Notes (Signed)
[facility]    CSN: [id number] Arrival date & time: 09/01/20  1858      History   Chief Complaint Chief Complaint  Patient presents with  . Cough    HPI Christina [name] is a 75 y.o. female.   Here today with cough, anorexia, malaise x 1 day. Denies CP, SOB, wheezing, congestion, N/V/D. So far not trying anything OTC for sxs. No known sick contacts. No hx of pulmonary dz known.      Past Medical History:  Diagnosis Date  . Anemia   . Breast cancer ([facility]) 08/28/2015   Bilateral  . Breast cancer of upper-outer quadrant of right female breast ([facility]) 06/23/2015  . High cholesterol   . Hx of radiation therapy 11/05/15- 12/16/15   Right Breast and Left Breast  . Hypertension   . Personal history of radiation therapy    Right Breast Cancer  . Vitamin D deficiency 06/29/2018    Patient Active Problem List   Diagnosis Date Noted  . Vitamin D deficiency 06/29/2018  . Malignant neoplasm of lower-inner quadrant of left breast in female, estrogen receptor positive ([facility]) 10/20/2015  . Malignant neoplasm of upper-outer quadrant of right breast in female, estrogen receptor positive ([facility]) 06/23/2015  . CONSTIPATION 04/01/2010  . PALPITATIONS 04/01/2010  . URINALYSIS, ABNORMAL 04/01/2010  . ANEMIA 05/22/2009  . DENTAL CARIES 04/21/2009  . Elevated LDL cholesterol level 03/18/2008  . Essential hypertension 08/23/2006    Past Surgical History:  Procedure Laterality Date  . BREAST LUMPECTOMY Left 08/28/2015  . BREAST LUMPECTOMY Right 08/28/2015  . BREAST LUMPECTOMY WITH NEEDLE LOCALIZATION AND AXILLARY SENTINEL LYMPH NODE BX Bilateral 08/28/2015   Procedure: BILATERAL BREAST LUMPECTOMY WITH BILATERAL  NEEDLE LOCALIZATION AND BILATERAL AXILLARY SENTINEL LYMPH NODE BX;  Surgeon: Harriette Bouillon, MD;  Location[facility];  Service: General;  Laterality: Bilateral;    OB History   No obstetric history on file.      Home Medications    Prior to Admission medications    Medication Sig Start Date End Date Taking? Authorizing Provider  anastrozole (ARIMIDEX) 1 MG tablet Take 1 tablet (1 mg total) by mouth daily. 02/08/20   Magrinat, Valentino Hue, MD  fexofenadine (ALLEGRA) 60 MG tablet Take 1 tablet (60 mg total) by mouth 2 (two) times daily. Patient not taking: No sig reported 12/11/17   Ofilia Neas, PA-C  losartan-hydrochlorothiazide (HYZAAR) 100-25 MG tablet TAKE 1/2 TABLET BY MOUTH ONCE A DAY 11/19/19   Magrinat, Valentino Hue, MD  meloxicam (MOBIC) 7.5 MG tablet Take 1 tablet (7.5 mg total) by mouth daily. Take with food 05/23/20   Eustace Moore, MD  simvastatin (ZOCOR) 10 MG tablet TAKE 1 TABLET BY MOUTH AT BEDTIME. 09/29/18   Henson, Vickie L, NP-C  traZODone (DESYREL) 50 MG tablet  09/29/17   [provider]  triamcinolone ointment (KENALOG) 0.1 % Apply 1 application topically 2 (two) times daily. 05/23/20   Eustace Moore, MD  Vitamin D, Ergocalciferol, (DRISDOL) 1.25 MG (50000 UT) CAPS capsule Take 1 capsule (50,000 Units total) by mouth every 7 (seven) days. 06/29/18   Avanell Shackleton, NP-C    Family History History reviewed. No pertinent family history.  Social History Social History   Tobacco Use  . Smoking status: Never Smoker  . Smokeless tobacco: Never Used  Substance Use Topics  . Alcohol use: No  . Drug use: No     Allergies   Onion, Aspirin, and Hydrocodone   Review of Systems  Review of Systems PER HPI    Physical Exam Triage Vital Signs ED Triage Vitals  Enc Vitals Group     BP 09/01/20 2011 (!) 155/91     Pulse Rate 09/01/20 2011 92     Resp 09/01/20 2011 20     Temp 09/01/20 2011 99.7 F (37.6 C)     Temp Source 09/01/20 2011 Oral     SpO2 09/01/20 2011 97 %     Weight --      Height --      Head Circumference --      Peak Flow --      Pain Score 09/01/20 2009 0     Pain Loc --      Pain Edu? --      Excl. in [place]? --    No data found.  Updated Vital Signs BP (!) 155/91 (BP Location: Right Arm)    Pulse 92   Temp 99.7 F (37.6 C) (Oral)   Resp 20   SpO2 97%   Visual Acuity Right Eye Distance:   Left Eye Distance:   Bilateral Distance:    Right Eye Near:   Left Eye Near:    Bilateral Near:     Physical Exam Vitals and nursing note reviewed.  Constitutional:      Appearance: Normal appearance. She is not ill-appearing.  HENT:     Head: Atraumatic.     Right Ear: Tympanic membrane normal.     Left Ear: Tympanic membrane normal.     Nose: Nose normal.     Mouth/Throat:     Mouth: Mucous membranes are moist.     Pharynx: No posterior oropharyngeal erythema.  Eyes:     Extraocular Movements: Extraocular movements intact.     Conjunctiva/sclera: Conjunctivae normal.  Cardiovascular:     Rate and Rhythm: Normal rate and regular rhythm.     Heart sounds: Normal heart sounds.  Pulmonary:     Effort: Pulmonary effort is normal. No respiratory distress.     Breath sounds: Normal breath sounds. No wheezing or rales.  Abdominal:     General: Bowel sounds are normal. There is no distension.     Palpations: Abdomen is soft.     Tenderness: There is no abdominal tenderness. There is no right CVA tenderness, left CVA tenderness or guarding.  Musculoskeletal:        General: Normal range of motion.     Cervical back: Normal range of motion and neck supple.  Skin:    General: Skin is warm and dry.  Neurological:     Mental Status: She is alert and oriented to person, place, and time.  Psychiatric:        Mood and Affect: Mood normal.        Thought Content: Thought content normal.        Judgment: Judgment normal.      UC Treatments / Results  Labs (all labs ordered are listed, but only abnormal results are displayed) Labs Reviewed  SARS CORONAVIRUS 2 (TAT 6-24 HRS) - Abnormal; Notable for the following components:      Result Value   SARS Coronavirus 2 POSITIVE (*)    All other components within normal limits    EKG   Radiology No results  found.  Procedures Procedures (including critical care time)  Medications Ordered in UC Medications - No data to display  Initial Impression / Assessment and Plan / UC Course  I have reviewed the triage vital  signs and the nursing notes.  Pertinent labs & imaging results that were available during my care of the patient were reviewed by me and considered in my medical decision making (see chart for details).     Well appearing today with reassuring vital signs. COVID pcr pending, discussed supportive care and OTC remedies. Return for acutely worsening sxs, isolate until results return.   Final Clinical Impressions(s) / UC Diagnoses   Final diagnoses:  Viral URI with cough  Decreased appetite   Discharge Instructions   None    ED Prescriptions    None     PDMP not reviewed this encounter.   Volney American, [place] 09/04/20 1806

## 2020-09-10 ENCOUNTER — Telehealth: Payer: Self-pay

## 2020-09-10 ENCOUNTER — Telehealth: Payer: Self-pay | Admitting: Family

## 2020-09-10 NOTE — Telephone Encounter (Signed)
Daughter [name] called for appointment for COVID treatment. Informed our providers help manage symptoms & do not offer infusion or treatment onsite at this time.  Offered morning appointment next week as it is the first available & offered evening appt with [facility] tonight or Friday. She will call back to secure appointment.

## 2020-09-10 NOTE — Telephone Encounter (Signed)
Was discussing COIVD19 treatment options with patient's daughter who asked me to review her mother as well for possible consideration. Okay per DPR. Christina [name] tested positive for COVID 19 on 09/01/20. As such, she is outside of the day window for MAB or Remdesivir therapy. Provided with contact information for Post-Covid care clinic. Encouraged to schedule appointment with primary care provider.   Loel Dubonnet, NP

## 2020-09-11 ENCOUNTER — Telehealth: Payer: Medicare Other | Admitting: Family Medicine

## 2020-09-11 DIAGNOSIS — U071 COVID-19: Secondary | ICD-10-CM | POA: Diagnosis not present

## 2020-09-11 NOTE — Progress Notes (Signed)
[name], nigg are scheduled for a virtual visit with your provider today.    Just as we do with appointments in the office, we must obtain your consent to participate.  Your consent will be active for this visit and any virtual visit you may have with one of our providers in the next 365 days.    If you have a MyChart account, I can also send a copy of this consent to you electronically.  All virtual visits are billed to your insurance company just like a traditional visit in the office.  As this is a virtual visit, video technology does not allow for your provider to perform a traditional examination.  This may limit your provider's ability to fully assess your condition.  If your provider identifies any concerns that need to be evaluated in person or the need to arrange testing such as labs, EKG, etc, we will make arrangements to do so.    Although advances in technology are sophisticated, we cannot ensure that it will always work on either your end or our end.  If the connection with a video visit is poor, we may have to switch to a telephone visit.  With either a video or telephone visit, we are not always able to ensure that we have a secure connection.   I need to obtain your verbal consent now.   Are you willing to proceed with your visit today?   Christina [name] has provided verbal consent on 09/11/2020 for a virtual visit (video or telephone).   [name], MD 09/11/2020  7:01 PM   Choose 1 Note Type (Video or Telephone):    Virtual Visit via Video Note   This visit type was conducted due to national recommendations for restrictions regarding the COVID-19 Pandemic (e.g. social distancing) in an effort to limit this patient's exposure and mitigate transmission in our community.  Due to her co-morbid illnesses, this patient is at least at moderate risk for complications without adequate follow up.  This format is felt to be most appropriate for this patient at th is time.  All issues  noted in this document were discussed and addressed.  A limited physical exam was performed with this format.  Please refer to the patient's chart for her consent to telehealth for [facility].    Date:  09/11/2020   ID:  Christina [name], DOB [birth year], MRN [id number]  Patient Location: Home Provider Location: Other:  Home   Participants: Patient and Provider for Visit and Wrap up  Method of visit: Video Location of Patient: Home Location of Provider: Office Consent was obtain for visit over the telephone. Services rendered by provider: visit was performed via video  A video enabled telemedicine application was used and I verified that I am speaking with the correct person using two identifiers.  PCP:  Girtha Rm, NP-C   Chief Complaint:  Patient began experiencing symptoms on 09/03/20. She tested positive for COVID 19.   History of Present Illness:    Christina [name] is a 75 y.o. female with history as stated below. Presents video telehealth for an acute care visit  Onset of symptoms was on 09/03/20 and symptoms have been persistent and include: anorexia and loss of appetite, hypertension, diarrhea, rhinorrhea.  They are currently improving and patient feels as though she is getting better.  Denies having fevers, chills, shortness of breath, cough, chest pain, ear pain, sore throat or other sick contacts.  No other aggravating or  relieving factors.  No other c/o.  The patient does not currently have symptoms concerning for COVID-19 infection (fever, chills, cough, or new shortness of breath).   Past Medical, Surgical, Social History, Allergies, and Medications have been Reviewed.  Past Medical History:  Diagnosis Date  . Anemia   . Breast cancer ([place]) 08/28/2015   Bilateral  . Breast cancer of upper-outer quadrant of right female breast ([place]) 06/23/2015  . High cholesterol   . Hx of radiation therapy 11/05/15- 12/16/15   Right Breast and Left Breast  .  Hypertension   . Personal history of radiation therapy    Right Breast Cancer  . Vitamin D deficiency 06/29/2018    No outpatient medications have been marked as taking for the 09/11/20 encounter (Video Visit) with Laqueta Linden, MD.     Allergies:   Onion, Aspirin, and Hydrocodone   ROS See HPI for history of present illness.  Physical Exam  female speaking comfortably in full sentences in no acute distress. Appears stated age         There are no diagnoses linked to this encounter.  Covid 19 - referral for infusion clinic sent - given length of time from positive test and symptoms improving unlikely even with higher risk comorbidities for patient to qualify for infusion - counseled patient and her daughter to follow up with urgent care that they sought care at yesterday for further evaluation and treatment if symptoms reappear or new symptoms appear or patient's status worsens - counseled patient and her daughter of the limited supply of infusions and antivirals    Time:   Today, I have spent 20 minutes with the patient with telehealth technology discussing the above problems, reviewing the chart, previous notes, medications and orders.    Tests Ordered: Orders Placed This Encounter  Procedures  . Ambulatory referral for Covid Treatment    Medication Changes: No orders of the defined types were placed in this encounter.    Disposition:  Follow up with PCP as needed if symptoms worsen or new symptoms Signed, [name], MD  09/11/2020 7:01 PM

## 2020-09-15 ENCOUNTER — Other Ambulatory Visit: Payer: Self-pay

## 2020-09-15 ENCOUNTER — Encounter: Payer: Self-pay | Admitting: Family Medicine

## 2020-09-15 ENCOUNTER — Encounter: Payer: Self-pay | Admitting: Internal Medicine

## 2020-09-15 ENCOUNTER — Ambulatory Visit: Payer: Medicare Other

## 2020-09-15 ENCOUNTER — Telehealth (INDEPENDENT_AMBULATORY_CARE_PROVIDER_SITE_OTHER): Payer: Medicare Other | Admitting: Family Medicine

## 2020-09-15 DIAGNOSIS — R5383 Other fatigue: Secondary | ICD-10-CM | POA: Diagnosis not present

## 2020-09-15 DIAGNOSIS — R058 Other specified cough: Secondary | ICD-10-CM | POA: Diagnosis not present

## 2020-09-15 DIAGNOSIS — U071 COVID-19: Secondary | ICD-10-CM | POA: Diagnosis not present

## 2020-09-15 NOTE — Progress Notes (Signed)
   Subjective:  Documentation for virtual audio and video telecommunications through [place] encounter:  The patient was located at home. 2 patient identifiers used.  The provider was located in the office. The patient did consent to this visit and is aware of possible charges through their insurance for this visit.  The other persons participating in this telemedicine service were her daughter. Time spent on call was 16 minutes and in review of previous records 20 minutes total.  This virtual service is not related to other E/M service within previous 7 days.   Patient ID: Christina [name], female    DOB: 1946-01-22, 75 y.o.   MRN: [id number]  HPI Chief Complaint  Patient presents with  . Covid Positive    January 12th positive covid. Symptoms- cough, sometimes headache,congestion.    States she had a positive Covid test on 09/03/2020 and symptom onset was 09/01/2020. She is 14 days from onset of symptoms.  She was seen virtually on 09/11/2020 for Covid symptoms which were improving. She was advised that she was outside the window to receive antibody infusion or oral medication.   States she is feeling tired at times and has a mild dry cough. Occasionally she spits out phlegm.  States she got her appetite back. Sleeping ok.   Taking Tylenol as needed.   Denies fever, chills, dizziness, chest pain, palpitations, shortness of breath, abdominal pain, N/V/D, urinary symptoms, LE edema.   She lives with her daughter and sons.   States she did receive 2 Pfizer Covid vaccines.   States she did not get a flu shot or the Covid booster.    Reviewed allergies, medications, past medical, surgical, family, and social history.   Review of Systems Pertinent positives and negatives in the history of present illness.     Objective:   Physical Exam There were no vitals taken for this visit.  Alert and oriented and in no acute distress. Respirations unlabored. Normal speech.        Assessment & Plan:  COVID-19 virus infection  Fatigue, unspecified type  Dry cough  States she needs a return to work note. She works on an [employer], television/film set.  Discussed that she is 14 days from onset of symptoms and appears to be close to her normal state of health. Continue to treat cough and wear her mask outside of her home.  She will follow up as needed.

## 2020-09-18 ENCOUNTER — Ambulatory Visit: Payer: Medicare Other

## 2020-09-19 MED FILL — LOSARTAN-HCTZ 50-12.5 MG TA: 50-12.5 | 30 days supply | Qty: 30 | Fill #4

## 2020-09-22 ENCOUNTER — Encounter: Payer: Self-pay | Admitting: Physician Assistant

## 2020-09-22 ENCOUNTER — Telehealth: Payer: Medicare Other | Admitting: Physician Assistant

## 2020-09-22 DIAGNOSIS — Z8616 Personal history of COVID-19: Secondary | ICD-10-CM

## 2020-09-22 NOTE — Patient Instructions (Signed)
I am thankful that you are feeling better, I have sent a note to your MyChart account for you to return to work.  Please let us know if there is anything else we can do for you  Kennieth Rad, PA-C [email or web address]

## 2020-09-22 NOTE — Progress Notes (Signed)
I connected with  Christina [name] on 09/22/20 by a video enabled telemedicine application and verified that I am speaking with the correct person using two identifiers.   I discussed the limitations of evaluation and management by telemedicine. The patient expressed understanding and agreed to proceed.    Established Patient Office Visit  Subjective:  Patient ID: Christina [name], female    DOB: [birth year]  Age: 75 y.o. MRN: [id number]  CC:  Chief Complaint  Patient presents with  . Covid Positive  Virtual Visit via Video Note  I connected with Christina [name] on 09/22/20 at  6:45 PM EST by a video enabled telemedicine application and verified that I am speaking with the correct person using two identifiers.  Location: Patient: Home Provider: Working remotely from home   I discussed the limitations of evaluation and management by telemedicine and the availability of in person appointments. The patient expressed understanding and agreed to proceed.  History of Present Illness:  Christina [name] reports that she had a positive Covid test on September 03, 2020 with symptom onset September 01, 2020.  Reports that she was seen virtually on September 15, 2020 requesting a return to work letter.  Reported at that time that she was still feeling tired at times and had a mild dry cough.  States that she was cleared to go back to work, but decided that she wanted to wait 1 more week given her fatigue and cough.  Reports that she is feeling much better, will have an occasional headache that is relieved with Tylenol, very rare cough.    Reports that she received her [place] booster 2 days ago.  Observations/Objective: Medical history and current medications reviewed, no physical exam completed     Past Medical History:  Diagnosis Date  . Anemia   . Breast cancer ([place]) 08/28/2015   Bilateral  . Breast cancer of upper-outer quadrant of right female breast ([place]) 06/23/2015  . High cholesterol    . Hx of radiation therapy 11/05/15- 12/16/15   Right Breast and Left Breast  . Hypertension   . Personal history of radiation therapy    Right Breast Cancer  . Vitamin D deficiency 06/29/2018    Past Surgical History:  Procedure Laterality Date  . BREAST LUMPECTOMY Left 08/28/2015  . BREAST LUMPECTOMY Right 08/28/2015  . BREAST LUMPECTOMY WITH NEEDLE LOCALIZATION AND AXILLARY SENTINEL LYMPH NODE BX Bilateral 08/28/2015   Procedure: BILATERAL BREAST LUMPECTOMY WITH BILATERAL  NEEDLE LOCALIZATION AND BILATERAL AXILLARY SENTINEL LYMPH NODE BX;  Surgeon: Erroll Luna, MD;  Location: [facility];  Service: General;  Laterality: Bilateral;    No family history on file.  Social History   Socioeconomic History  . Marital status: Single    Spouse name: Not on file  . Number of children: Not on file  . Years of education: Not on file  . Highest education level: Not on file  Occupational History  . Not on file  Tobacco Use  . Smoking status: Never Smoker  . Smokeless tobacco: Never Used  Substance and Sexual Activity  . Alcohol use: No  . Drug use: No  . Sexual activity: Never  Other Topics Concern  . Not on file  Social History Narrative   2 children   Social Determinants of Health   Financial Resource Strain: Not on file  Food Insecurity: Not on file  Transportation Needs: Not on file  Physical Activity: Not on file  Stress: Not on file  Social  Connections: Not on file  Intimate Partner Violence: Not on file    Outpatient Medications Prior to Visit  Medication Sig Dispense Refill  . anastrozole (ARIMIDEX) 1 MG tablet Take 1 tablet (1 mg total) by mouth daily. 90 tablet 3  . fexofenadine (ALLEGRA) 60 MG tablet Take 1 tablet (60 mg total) by mouth 2 (two) times daily. 60 tablet 0  . losartan-hydrochlorothiazide (HYZAAR) 100-25 MG tablet TAKE 1/2 TABLET BY MOUTH ONCE A DAY 15 tablet 0  . meloxicam (MOBIC) 7.5 MG tablet Take 1 tablet (7.5 mg total) by mouth daily. Take with food  30 tablet 0  . simvastatin (ZOCOR) 10 MG tablet TAKE 1 TABLET BY MOUTH AT BEDTIME. 30 tablet 2  . traZODone (DESYREL) 50 MG tablet     . triamcinolone ointment (KENALOG) 0.1 % Apply 1 application topically 2 (two) times daily. 15 g 1  . Vitamin D, Ergocalciferol, (DRISDOL) 1.25 MG (50000 UT) CAPS capsule Take 1 capsule (50,000 Units total) by mouth every 7 (seven) days. 8 capsule 0   No facility-administered medications prior to visit.    Allergies  Allergen Reactions  . Onion Nausea And Vomiting    White onion  . Aspirin Nausea Only and Nausea And Vomiting  . Hydrocodone Rash    With itching    ROS Review of Systems  Constitutional: Negative for chills, fatigue and fever.  HENT: Negative for congestion.   Eyes: Negative.   Respiratory: Positive for cough. Negative for shortness of breath and wheezing.   Cardiovascular: Negative.   Gastrointestinal: Negative for nausea and vomiting.  Endocrine: Negative.   Genitourinary: Negative.   Musculoskeletal: Negative for myalgias.  Skin: Negative.   Allergic/Immunologic: Negative.   Neurological: Positive for headaches. Negative for dizziness.  Hematological: Negative.   Psychiatric/Behavioral: Negative.       Objective:     There were no vitals taken for this visit. Wt Readings from Last 3 Encounters:  10/17/19 181 lb 3.2 oz (82.2 kg)  09/29/18 175 lb 8 oz (79.6 kg)  06/28/18 176 lb 12.8 oz (80.2 kg)     Health Maintenance Due  Topic Date Due  . Hepatitis C Screening  Never done  . COLONOSCOPY (Pts 45-69yr Insurance coverage will need to be confirmed)  Never done  . PNA vac Low Risk Adult (1 of 2 - PCV13) 02/14/2013  . TETANUS/TDAP  11/22/2015  . INFLUENZA VACCINE  03/23/2020    There are no preventive care reminders to display for this patient.  Lab Results  Component Value Date   TSH 0.718 06/28/2018   Lab Results  Component Value Date   WBC 3.9 (L) 10/17/2019   HGB 10.4 (L) 10/17/2019   HCT 33.3 (L)  10/17/2019   MCV 86.3 10/17/2019   PLT 212 10/17/2019   Lab Results  Component Value Date   NA 142 10/17/2019   K 4.1 10/17/2019   CHLORIDE 106 09/23/2016   CO2 26 10/17/2019   GLUCOSE 98 10/17/2019   BUN 24 (H) 10/17/2019   CREATININE 1.00 10/17/2019   BILITOT 0.6 10/17/2019   ALKPHOS 119 10/17/2019   AST 19 10/17/2019   ALT 16 10/17/2019   PROT 7.3 10/17/2019   ALBUMIN 3.9 10/17/2019   CALCIUM 9.3 10/17/2019   ANIONGAP 10 10/17/2019   EGFR 60 (L) 09/23/2016   Lab Results  Component Value Date   CHOL 191 06/28/2018   Lab Results  Component Value Date   HDL 64 06/28/2018   Lab Results  Component Value  Date   LDLCALC 115 (H) 06/28/2018   Lab Results  Component Value Date   TRIG 58 06/28/2018   Lab Results  Component Value Date   CHOLHDL 3.0 06/28/2018   No results found for: HGBA1C    Assessment & Plan:   Problem List Items Addressed This Visit   None   Visit Diagnoses    History of COVID-19    -  Primary     Assessment and Plan: 1. History of COVID-19 Request a return to work note, one sent to her MyChart account.    Follow Up Instructions:    I discussed the assessment and treatment plan with the patient. The patient was provided an opportunity to ask questions and all were answered. The patient agreed with the plan and demonstrated an understanding of the instructions.   The patient was advised to call back or seek an in-person evaluation if the symptoms worsen or if the condition fails to improve as anticipated.   No orders of the defined types were placed in this encounter.   Follow-up: Return if symptoms worsen or fail to improve.    Loraine Grip Garlen Reinig, PA-C

## 2020-10-18 MED FILL — LOSARTAN POTASSIUM-HCTZ 50-: 50-12.5 | 30 days supply | Qty: 30 | Fill #5

## 2020-10-20 ENCOUNTER — Other Ambulatory Visit (HOSPITAL_COMMUNITY): Payer: Self-pay | Admitting: Physician Assistant

## 2020-10-20 MED FILL — SIMVASTATIN 10 MG TABLET: 10 | 30 days supply | Qty: 30 | Fill #0

## 2020-11-07 ENCOUNTER — Other Ambulatory Visit (HOSPITAL_COMMUNITY): Payer: Self-pay | Admitting: Physician Assistant

## 2020-11-15 MED FILL — ANASTROZOLE 1 MG TABLET: 1 | 90 days supply | Qty: 90 | Fill #3

## 2020-11-15 MED FILL — SIMVASTATIN 10 MG TABLET: 10 | 30 days supply | Qty: 30 | Fill #0

## 2020-11-15 MED FILL — LOSARTAN POTASSIUM-HCTZ 50-: 50-12.5 | 30 days supply | Qty: 30 | Fill #6

## 2020-11-19 ENCOUNTER — Telehealth: Payer: Self-pay

## 2020-11-19 NOTE — Telephone Encounter (Signed)
Provider Zelda will be working virtual 4/5. Called pt left vm that appt is virtual. In person preferred call [phone] to reschedule.

## 2020-11-25 ENCOUNTER — Ambulatory Visit: Payer: Medicare Other | Attending: Nurse Practitioner | Admitting: Nurse Practitioner

## 2020-11-25 ENCOUNTER — Telehealth: Payer: Self-pay | Admitting: Nurse Practitioner

## 2020-11-25 ENCOUNTER — Other Ambulatory Visit: Payer: Self-pay

## 2020-11-25 NOTE — Telephone Encounter (Signed)
Attempted to contact patient. LVM to return call to office. It also appears she already has a PCP with [facility]

## 2020-12-12 ENCOUNTER — Other Ambulatory Visit: Payer: Self-pay | Admitting: *Deleted

## 2020-12-12 ENCOUNTER — Other Ambulatory Visit (HOSPITAL_COMMUNITY): Payer: Self-pay

## 2020-12-12 DIAGNOSIS — C50411 Malignant neoplasm of upper-outer quadrant of right female breast: Secondary | ICD-10-CM

## 2020-12-12 DIAGNOSIS — Z170 Estrogen receptor positive status [ER+]: Secondary | ICD-10-CM

## 2020-12-12 MED FILL — Losartan Potassium & Hydrochlorothiazide Tab 50-12.5 MG: ORAL | 30 days supply | Qty: 30 | Fill #0 | Status: AC

## 2020-12-14 ENCOUNTER — Encounter: Payer: Self-pay | Admitting: Oncology

## 2020-12-14 NOTE — Progress Notes (Incomplete)
[place]  Telephone:([phone]) [phone] Fax:(336) [phone]     ID: Christina [name] DOB: 03-Mar-1946  MR#: [id number]  VZC#:[id number]  Patient Care Team: Gerald Leitz as PCP - General (Physician Assistant) Magrinat, Virgie Dad, MD as Consulting Physician (Oncology) Eppie Gibson, MD as Attending Physician (Radiation Oncology) Erroll Luna, MD as Consulting Physician (General Surgery) Pavelock, Ralene Bathe, MD as Consulting Physician (Specialist) OTHER MD:  CHIEF COMPLAINT: Estrogen receptor positive breast cancer  CURRENT TREATMENT: anastrozole   INTERVAL HISTORY: [name] returns today for follow-up of her estrogen receptor positive breast cancers.   She continues on anastrozole.  Aside from hot flashes she has tolerated this medication well.  Her most recent bone density screening on 06/16/2018 showed a T-score of -1.8, which is considered osteopenic. She has been taking Vitamin D to treat bone density.  She also walks for exercise, usually about 3 times a week.  Since her last visit, she underwent bilateral diagnostic mammography with tomography at [facility] on 06/27/2020 showing: breast density category C; no evidence of malignancy in either breast.   REVIEW OF SYSTEMS: [place]: fully vaccinated [employer]), infection 09/01/2020, booster 09/19/2020   BREAST CANCER HISTORY: From the original intake note:    [name] had screening mammography (not available for review today) showing a possible mass in the right breast. On 06/09/2015 she underwent unilateral right mammography with [facility]osynthesis [facility]. This found the breast density to be category D. The area of distortion seen on the 2-D screening did not persist, but there was a different area of distortion in the outer right breast above the nipple. This was palpable at the 9:00 position 3 cm from the nipple. Ultrasound showed 2 adjacent irregular hypoechoic  masses, the larger containing internal echogenic foci. This measured 1.9 cm. 5 mm lateral to this the second mass measured 1.2 cm maximally. Taken together the 2 masses measured 3.3 cm maximally.  Biopsy of the 2 masses in question 06/13/2015 showed ([id number]) identical invasive ductal carcinomas, estrogen receptor 100% positive, progesterone receptor 100% positive, both with strong staining intensity, with an MIB-1 of 5%, and no HER-2 amplification, the signals ratio being 1.15 and the number per cell 1.95.  The patient's subsequent history is as detailed below   PAST MEDICAL HISTORY: Past Medical History:  Diagnosis Date  . Anemia   . Breast cancer ([place]) 08/28/2015   Bilateral  . Breast cancer of upper-outer quadrant of right female breast ([place] [facility]) 06/23/2015  . High cholesterol   . Hx of radiation therapy 11/05/15- 12/16/15   Right Breast and Left Breast  . Hypertension   . Personal history of radiation therapy    Right Breast Cancer  . Vitamin D deficiency 06/29/2018    PAST SURGICAL HISTORY: Past Surgical History:  Procedure Laterality Date  . BREAST LUMPECTOMY Left 08/28/2015  . BREAST LUMPECTOMY Right 08/28/2015  . BREAST LUMPECTOMY WITH NEEDLE LOCALIZATION AND AXILLARY SENTINEL LYMPH NODE BX Bilateral 08/28/2015   Procedure: BILATERAL BREAST LUMPECTOMY WITH BILATERAL  NEEDLE LOCALIZATION AND BILATERAL AXILLARY SENTINEL LYMPH NODE BX;  Surgeon: Erroll Luna, MD;  Location: [facility];  Service: General;  Laterality: Bilateral;    FAMILY HISTORY Family History  Problem Relation Age of Onset  . Cancer Mother   . Diabetes Sister   . Lung cancer Brother   . Diabetes Brother   The patient's parents died when she was very young. She thinks her father may have died  from "high blood pressure", and her mother from cancer, possibly of the uterus. [name] had 3 brothers, 2 sisters. One brother died with lung cancer. He had a history of tobacco abuse. One other brother and one sister have  died from complications of diabetes   GYNECOLOGIC HISTORY:  No LMP recorded. Patient has had a hysterectomy. Menarche age 61, first live birth age 10, the patient is [place] P2. She does not recall exactly when she stopped having periods. She never took hormone replacement. She never used oral contraceptives.   SOCIAL HISTORY:  Jovonda works in the [employer] on [employer]. At home she lives with her daughter [name] who works in [employer], and son [name], who has a history of schizophrenia.    ADVANCED DIRECTIVES: Not in place. At the initial clinic visit the patient was given the appropriate forms to complete and notarize at her discretion.   HEALTH MAINTENANCE: Social History   Tobacco Use  . Smoking status: Never Smoker  . Smokeless tobacco: Never Used  Substance Use Topics  . Alcohol use: No  . Drug use: No     Colonoscopy: never  PAP:  Bone density: remote  Lipid panel:  Allergies  Allergen Reactions  . Onion Nausea And Vomiting    White onion  . Aspirin Nausea Only and Nausea And Vomiting  . Hydrocodone Rash    With itching    Current Outpatient Medications  Medication Sig Dispense Refill  . anastrozole (ARIMIDEX) 1 MG tablet TAKE 1 TABLET BY MOUTH ONCE DAILY 90 tablet 3  . fexofenadine (ALLEGRA) 60 MG tablet Take 1 tablet (60 mg total) by mouth 2 (two) times daily. 60 tablet 0  . losartan-hydrochlorothiazide (HYZAAR) 100-25 MG tablet TAKE 1/2 TABLET BY MOUTH ONCE A DAY 15 tablet 0  . losartan-hydrochlorothiazide (HYZAAR) 50-12.5 MG tablet TAKE 1 TABLET BY MOUTH DAILY. 90 tablet 0  . losartan-hydrochlorothiazide (HYZAAR) 50-12.5 MG tablet TAKE 1 TABLET BY MOUTH ONCE DAILY 90 tablet 3  . meloxicam (MOBIC) 7.5 MG tablet TAKE 1 TABLET BY MOUTH ONCE DAILY WITH FOOD. 30 tablet 0  . simvastatin (ZOCOR) 10 MG tablet TAKE 1 TABLET BY MOUTH AT BEDTIME. 30 tablet 2  . simvastatin (ZOCOR) 10 MG tablet TAKE 1 TABLET BY MOUTH AT BEDTIME 30  tablet 2  . traZODone (DESYREL) 50 MG tablet     . triamcinolone ointment (KENALOG) 0.1 % APPLY TO AFFECTED AREA TWICE DAILY. 15 g 0  . Vitamin D, Ergocalciferol, (DRISDOL) 1.25 MG (50000 UT) CAPS capsule Take 1 capsule (50,000 Units total) by mouth every 7 (seven) days. 8 capsule 0   No current facility-administered medications for this visit.    OBJECTIVE: Middle-aged African-American woman in no acute distress  There were no vitals filed for this visit.   There is no height or weight on file to calculate BMI.    ECOG FS:0 - Asymptomatic  Sclerae unicteric, EOMs intact Wearing a mask No cervical or supraclavicular adenopathy Lungs no rales or rhonchi Heart regular rate and rhythm Abd soft, nontender, positive bowel sounds MSK no focal spinal tenderness, no upper extremity lymphedema Neuro: nonfocal, well oriented, appropriate affect Breasts:    {Sclerae unicteric Wearing a mask No cervical or supraclavicular adenopathy Lungs no rales or rhonchi Heart regular rate and rhythm Abd soft, nontender, positive bowel sounds MSK no focal spinal tenderness, no upper extremity lymphedema Neuro: nonfocal, well oriented, appropriate affect Breasts: She has had bilateral lumpectomies and radiation bilaterally.  There  is no evidence of local recurrence.  Both axillae are benign.}   LAB RESULTS:  CMP     Component Value Date/Time   NA 142 10/17/2019 1107   NA 143 06/28/2018 1550   NA 143 09/23/2016 1426   K 4.1 10/17/2019 1107   K 4.5 09/23/2016 1426   CL 106 10/17/2019 1107   CO2 26 10/17/2019 1107   CO2 29 09/23/2016 1426   GLUCOSE 98 10/17/2019 1107   GLUCOSE 95 09/23/2016 1426   BUN 24 (H) 10/17/2019 1107   BUN 21 06/28/2018 1550   BUN 21.8 09/23/2016 1426   CREATININE 1.00 10/17/2019 1107   CREATININE 1.1 09/23/2016 1426   CALCIUM 9.3 10/17/2019 1107   CALCIUM 10.0 09/23/2016 1426   PROT 7.3 10/17/2019 1107   PROT 6.8 06/28/2018 1550   PROT 7.4 09/23/2016 1426    ALBUMIN 3.9 10/17/2019 1107   ALBUMIN 4.3 06/28/2018 1550   ALBUMIN 4.0 09/23/2016 1426   AST 19 10/17/2019 1107   AST 23 09/23/2016 1426   ALT 16 10/17/2019 1107   ALT 19 09/23/2016 1426   ALKPHOS 119 10/17/2019 1107   ALKPHOS 151 (H) 09/23/2016 1426   BILITOT 0.6 10/17/2019 1107   BILITOT 0.58 09/23/2016 1426   GFRNONAA 57 (L) 10/17/2019 1107   GFRAA >60 10/17/2019 1107    INo results found for: SPEP, UPEP  Lab Results  Component Value Date   WBC 3.9 (L) 10/17/2019   NEUTROABS 1.9 10/17/2019   HGB 10.4 (L) 10/17/2019   HCT 33.3 (L) 10/17/2019   MCV 86.3 10/17/2019   PLT 212 10/17/2019      Chemistry      Component Value Date/Time   NA 142 10/17/2019 1107   NA 143 06/28/2018 1550   NA 143 09/23/2016 1426   K 4.1 10/17/2019 1107   K 4.5 09/23/2016 1426   CL 106 10/17/2019 1107   CO2 26 10/17/2019 1107   CO2 29 09/23/2016 1426   BUN 24 (H) 10/17/2019 1107   BUN 21 06/28/2018 1550   BUN 21.8 09/23/2016 1426   CREATININE 1.00 10/17/2019 1107   CREATININE 1.1 09/23/2016 1426      Component Value Date/Time   CALCIUM 9.3 10/17/2019 1107   CALCIUM 10.0 09/23/2016 1426   ALKPHOS 119 10/17/2019 1107   ALKPHOS 151 (H) 09/23/2016 1426   AST 19 10/17/2019 1107   AST 23 09/23/2016 1426   ALT 16 10/17/2019 1107   ALT 19 09/23/2016 1426   BILITOT 0.6 10/17/2019 1107   BILITOT 0.58 09/23/2016 1426      No results found for: LABCA2  No components found for: LABCA125  No results for input(s): INR in the last 168 hours.  Urinalysis    Component Value Date/Time   COLORURINE yellow 04/01/2010 0000   APPEARANCEUR Clear 04/01/2010 0000   LABSPEC 1.010 10/25/2017 2022   PHURINE 6.5 10/25/2017 2022   GLUCOSEU NEGATIVE 10/25/2017 2022   HGBUR TRACE (A) 10/25/2017 2022   HGBUR negative 04/01/2010 0000   BILIRUBINUR NEGATIVE 10/25/2017 2022   KETONESUR NEGATIVE 10/25/2017 2022   PROTEINUR NEGATIVE 10/25/2017 2022   UROBILINOGEN 0.2 10/25/2017 2022   NITRITE NEGATIVE  10/25/2017 2022   LEUKOCYTESUR SMALL (A) 10/25/2017 2022    STUDIES: No results found.   ASSESSMENT: 75 y.o. [place] woman status post right breastUpper outer quadrant biopsy 2 on 06/13/2015 for morphologically identical invasive ductal carcinomas measuring 1.9 and 1.2 cm, grade 2, estrogen and progesterone receptor strongly positive, HER-2 not amplified, with  an MIB-1 of 5%  (1) bilateral lumpectomies 08/28/2015 showed  (a) on the right, apT2 pN0,stage IIA invasive ductal carcinoma, grade 1, with repeat HER-2 again negative  (b) on the left, apT1c pN0, stage IA invasive ductal carcinoma, grade 1, with repeat HER-2 again negative  (2) Oncotype score of 5 predicts a risk of outside the breast recurrence within 10 years of 5% if the patient's only systemic therapy is tamoxifen for 5 years. It also predicts no significant benefit from chemotherapy   (3) adjuvant radiation completed 11/05/2015 - 12/16/2015  Site/dose:    - Right Breast / 46 Gy in 23 fractions  - Right Breast Boost / 14 Gy in 7 fractions  - Left Breast / 46 Gy in 23 fractions  - Left Breast Boost / 14 Gy in 7 fractions  (4) anastrozole started 12/29/2015  (a) Bone density 05/12/2016 shows a T score of -1.7  (b) Bone density 06/16/2018 shows a T-score of -1.8   PLAN: Elpidia is now 4 years out from definitive surgery for her breast cancer with no evidence of disease recurrence.  This is very favorable.  She is tolerating anastrozole well and the plan will be to continue that a total of 5 years which will take [place] through April 2022.  She wanted to know if she was gaining weight.  She was 173 pounds when we started and is currently 181 pounds.  I urged her to sign up to get COVID-19 vaccine as soon as she can.  We discussed the safety aspects involved.  I also encouraged her to continue to walk for exercise.  She will see me again in April 2022 and that will be her "graduation visit".  Total encounter time 25  minutes.*   Magrinat, Virgie Dad, MD  12/14/20 2:44 PM Medical Oncology and Hematology [facility] [place], [place] [address] Tel. [phone]    Fax. [phone]   I, Wilburn Mylar, am acting as scribe for Dr. Virgie Dad. Magrinat.  I, Lurline Del MD, have reviewed the above documentation for accuracy and completeness, and I agree with the above.   *Total Encounter Time as defined by the Centers for Medicare and Medicaid Services includes, in addition to the face-to-face time of a patient visit (documented in the note above) non-face-to-face time: obtaining and reviewing outside history, ordering and reviewing medications, tests or procedures, care coordination (communications with other health care professionals or caregivers) and documentation in the medical record.

## 2020-12-15 ENCOUNTER — Inpatient Hospital Stay: Payer: Medicare Other | Admitting: Oncology

## 2020-12-15 ENCOUNTER — Other Ambulatory Visit: Payer: Medicare Other

## 2020-12-15 ENCOUNTER — Ambulatory Visit: Payer: Medicare Other | Admitting: Oncology

## 2020-12-15 ENCOUNTER — Telehealth: Payer: Self-pay | Admitting: Oncology

## 2020-12-15 ENCOUNTER — Inpatient Hospital Stay: Payer: Medicare Other | Attending: Oncology

## 2020-12-15 NOTE — Telephone Encounter (Signed)
Scheduled appt per 4/25 sch msg. Called pt, no answer. Left msg with appt date and time.

## 2020-12-31 ENCOUNTER — Other Ambulatory Visit (HOSPITAL_COMMUNITY): Payer: Self-pay

## 2020-12-31 MED ORDER — LOSARTAN POTASSIUM-HCTZ 100-25 MG PO TABS
1.0000 | ORAL_TABLET | Freq: Every day | ORAL | 3 refills | Status: DC
  Filled 2020-12-31: qty 90, 90d supply, fill #0
  Filled 2021-01-03: qty 30, 30d supply, fill #0
  Filled 2021-02-06: qty 30, 30d supply, fill #1
  Filled 2021-03-07: qty 30, 30d supply, fill #2
  Filled 2021-04-04: qty 30, 30d supply, fill #3
  Filled 2021-05-06: qty 30, 30d supply, fill #4

## 2021-01-03 ENCOUNTER — Other Ambulatory Visit (HOSPITAL_COMMUNITY): Payer: Self-pay

## 2021-01-05 ENCOUNTER — Other Ambulatory Visit (HOSPITAL_COMMUNITY): Payer: Self-pay

## 2021-01-10 NOTE — Progress Notes (Signed)
Christina [name]  Telephone:(336) [phone] Fax:(336) [phone]     ID: Christina [name] Divito DOB: [birth year]  MR#: [id number]  DPO#:[id number]  Patient Care Team: Gerald Leitz as PCP - General (Physician Assistant) Verma Grothaus, Virgie Dad, MD as Consulting Physician (Oncology) Eppie Gibson, MD as Attending Physician (Radiation Oncology) Erroll Luna, MD as Consulting Physician (General Surgery) Pavelock, Ralene Bathe, MD as Consulting Physician (Specialist) OTHER MD:  CHIEF COMPLAINT: Estrogen receptor positive breast cancer  CURRENT TREATMENT: anastrozole   INTERVAL HISTORY: [name] returns today for follow-up of her estrogen receptor positive breast cancers.   She continues on anastrozole.  Aside from hot flashes she has tolerated this medication well.  Her most recent bone density screening on 06/16/2018 showed a T-score of -1.8, which is considered osteopenic. She has been taking Vitamin D to treat bone density.  She also walks for exercise, usually about 3 times a week.  Since her last visit, she underwent bilateral diagnostic mammography with tomography at [facility] on 06/27/2020 showing: breast density category C; no evidence of malignancy in either breast.   REVIEW OF SYSTEMS: Christina [name] continues to work.  She tells me family is doing fine.  She is already scheduled herself for her second booster shot for the SARS-CoV-2 virus.  Overall a detailed review of systems today was stable   COVID 19 VACCINATION STATUS: fully vaccinated [employer]), with booster 08/2020   BREAST CANCER HISTORY: From the original intake note:    Christina [name] had screening mammography (not available for review today) showing a possible mass in the right breast. On 06/09/2015 she underwent unilateral right mammography with tomosynthesis at the [facility]. This found the breast density to be category D. The area of distortion seen on the 2-D screening did not persist, but there was a different  area of distortion in the outer right breast above the nipple. This was palpable at the 9:00 position 3 cm from the nipple. Ultrasound showed 2 adjacent irregular hypoechoic masses, the larger containing internal echogenic foci. This measured 1.9 cm. 5 mm lateral to this the second mass measured 1.2 cm maximally. Taken together the 2 masses measured 3.3 cm maximally.  Biopsy of the 2 masses in question 06/13/2015 showed ([id number]) identical invasive ductal carcinomas, estrogen receptor 100% positive, progesterone receptor 100% positive, both with strong staining intensity, with an MIB-1 of 5%, and no HER-2 amplification, the signals ratio being 1.15 and the number per cell 1.95.  The patient's subsequent history is as detailed below   PAST MEDICAL HISTORY: Past Medical History:  Diagnosis Date  . Anemia   . Breast cancer ([place]) 08/28/2015   Bilateral  . Breast cancer of upper-outer quadrant of right female breast ([place]) 06/23/2015  . High cholesterol   . Hx of radiation therapy 11/05/15- 12/16/15   Right Breast and Left Breast  . Hypertension   . Personal history of radiation therapy    Right Breast Cancer  . Vitamin D deficiency 06/29/2018    PAST SURGICAL HISTORY: Past Surgical History:  Procedure Laterality Date  . BREAST LUMPECTOMY Left 08/28/2015  . BREAST LUMPECTOMY Right 08/28/2015  . BREAST LUMPECTOMY WITH NEEDLE LOCALIZATION AND AXILLARY SENTINEL LYMPH NODE BX Bilateral 08/28/2015   Procedure: BILATERAL BREAST LUMPECTOMY WITH BILATERAL  NEEDLE LOCALIZATION AND BILATERAL AXILLARY SENTINEL LYMPH NODE BX;  Surgeon: Erroll Luna, MD;  Location: [facility];  Service: General;  Laterality: Bilateral;    FAMILY HISTORY Family History  Problem Relation Age of Onset  . Cancer Mother   .  Diabetes Sister   . Lung cancer Brother   . Diabetes Brother   The patient's parents died when she was very young. She thinks her father may have died from "high blood pressure", and her mother from  cancer, possibly of the uterus. He is alive 3 brothers, 2 sisters. One brother died with lung cancer. He had a history of tobacco abuse. One other brother and one sister have died from complications of diabetes   GYNECOLOGIC HISTORY:  No LMP recorded. Patient has had a hysterectomy. Menarche age 71, first live birth age 9, the patient is [place] P2. She does not recall exactly when she stopped having periods. She never took hormone replacement. She never used oral contraceptives.   SOCIAL HISTORY:  Janah works in the [employer] on record. At home she lives with her daughter Christina [name] who works in [employer], and son Christina [name], who has a history of schizophrenia.    ADVANCED DIRECTIVES: Not in place. At the initial clinic visit the patient was given the appropriate forms to complete and notarize at her discretion.   HEALTH MAINTENANCE: Social History   Tobacco Use  . Smoking status: Never Smoker  . Smokeless tobacco: Never Used  Substance Use Topics  . Alcohol use: No  . Drug use: No     Colonoscopy: never  PAP:  Bone density: remote  Lipid panel:  Allergies  Allergen Reactions  . Onion Nausea And Vomiting    White onion  . Aspirin Nausea Only and Nausea And Vomiting  . Hydrocodone Rash    With itching    Current Outpatient Medications  Medication Sig Dispense Refill  . anastrozole (ARIMIDEX) 1 MG tablet TAKE 1 TABLET BY MOUTH ONCE DAILY 90 tablet 3  . fexofenadine (ALLEGRA) 60 MG tablet Take 1 tablet (60 mg total) by mouth 2 (two) times daily. 60 tablet 0  . losartan-hydrochlorothiazide (HYZAAR) 100-25 MG tablet TAKE 1/2 TABLET BY MOUTH ONCE A DAY 15 tablet 0  . losartan-hydrochlorothiazide (HYZAAR) 100-25 MG tablet Take 1 tablet by mouth daily. 90 tablet 3  . losartan-hydrochlorothiazide (HYZAAR) 50-12.5 MG tablet TAKE 1 TABLET BY MOUTH DAILY. 90 tablet 0  . losartan-hydrochlorothiazide (HYZAAR) 50-12.5 MG tablet TAKE 1 TABLET BY MOUTH  ONCE DAILY 90 tablet 3  . meloxicam (MOBIC) 7.5 MG tablet TAKE 1 TABLET BY MOUTH ONCE DAILY WITH FOOD. 30 tablet 0  . simvastatin (ZOCOR) 10 MG tablet TAKE 1 TABLET BY MOUTH AT BEDTIME. 30 tablet 2  . simvastatin (ZOCOR) 10 MG tablet TAKE 1 TABLET BY MOUTH AT BEDTIME 30 tablet 2  . traZODone (DESYREL) 50 MG tablet     . triamcinolone ointment (KENALOG) 0.1 % APPLY TO AFFECTED AREA TWICE DAILY. 15 g 0  . Vitamin D, Ergocalciferol, (DRISDOL) 1.25 MG (50000 UT) CAPS capsule Take 1 capsule (50,000 Units total) by mouth every 7 (seven) days. 8 capsule 0   No current facility-administered medications for this visit.    OBJECTIVE: African-American woman who appears stated age  75:   01/12/21 1358 01/12/21 1359  BP: (!) 160/75 (!) 160/75  Pulse: 77 77  Resp: 17 17  Temp:  97.7 F (36.5 C)     Body mass index is 29.12 kg/m.    ECOG FS:0 - Asymptomatic  Sclerae unicteric, left eye esotropia Wearing a mask No cervical or supraclavicular adenopathy Lungs no rales or rhonchi Heart regular rate and rhythm Abd soft, nontender, positive bowel sounds MSK no focal spinal tenderness, no  upper extremity lymphedema Neuro: nonfocal, well oriented, appropriate affect Breasts: Status postlumpectomy and status post radiation bilaterally.  There is no evidence of local recurrence.  Both axillae are benign.   LAB RESULTS:  CMP     Component Value Date/Time   NA 142 10/17/2019 1107   NA 143 06/28/2018 1550   NA 143 09/23/2016 1426   K 4.1 10/17/2019 1107   K 4.5 09/23/2016 1426   CL 106 10/17/2019 1107   CO2 26 10/17/2019 1107   CO2 29 09/23/2016 1426   GLUCOSE 98 10/17/2019 1107   GLUCOSE 95 09/23/2016 1426   BUN 24 (H) 10/17/2019 1107   BUN 21 06/28/2018 1550   BUN 21.8 09/23/2016 1426   CREATININE 1.00 10/17/2019 1107   CREATININE 1.1 09/23/2016 1426   CALCIUM 9.3 10/17/2019 1107   CALCIUM 10.0 09/23/2016 1426   PROT 7.3 10/17/2019 1107   PROT 6.8 06/28/2018 1550   PROT 7.4  09/23/2016 1426   ALBUMIN 3.9 10/17/2019 1107   ALBUMIN 4.3 06/28/2018 1550   ALBUMIN 4.0 09/23/2016 1426   AST 19 10/17/2019 1107   AST 23 09/23/2016 1426   ALT 16 10/17/2019 1107   ALT 19 09/23/2016 1426   ALKPHOS 119 10/17/2019 1107   ALKPHOS 151 (H) 09/23/2016 1426   BILITOT 0.6 10/17/2019 1107   BILITOT 0.58 09/23/2016 1426   GFRNONAA 57 (L) 10/17/2019 1107   GFRAA >60 10/17/2019 1107    INo results found for: SPEP, UPEP  Lab Results  Component Value Date   WBC 3.9 (L) 10/17/2019   NEUTROABS 1.9 10/17/2019   HGB 10.4 (L) 10/17/2019   HCT 33.3 (L) 10/17/2019   MCV 86.3 10/17/2019   PLT 212 10/17/2019      Chemistry      Component Value Date/Time   NA 142 10/17/2019 1107   NA 143 06/28/2018 1550   NA 143 09/23/2016 1426   K 4.1 10/17/2019 1107   K 4.5 09/23/2016 1426   CL 106 10/17/2019 1107   CO2 26 10/17/2019 1107   CO2 29 09/23/2016 1426   BUN 24 (H) 10/17/2019 1107   BUN 21 06/28/2018 1550   BUN 21.8 09/23/2016 1426   CREATININE 1.00 10/17/2019 1107   CREATININE 1.1 09/23/2016 1426      Component Value Date/Time   CALCIUM 9.3 10/17/2019 1107   CALCIUM 10.0 09/23/2016 1426   ALKPHOS 119 10/17/2019 1107   ALKPHOS 151 (H) 09/23/2016 1426   AST 19 10/17/2019 1107   AST 23 09/23/2016 1426   ALT 16 10/17/2019 1107   ALT 19 09/23/2016 1426   BILITOT 0.6 10/17/2019 1107   BILITOT 0.58 09/23/2016 1426      No results found for: LABCA2  No components found for: LABCA125  No results for input(s): INR in the last 168 hours.  Urinalysis    Component Value Date/Time   COLORURINE yellow 04/01/2010 0000   APPEARANCEUR Clear 04/01/2010 0000   LABSPEC 1.010 10/25/2017 2022   PHURINE 6.5 10/25/2017 2022   GLUCOSEU NEGATIVE 10/25/2017 2022   HGBUR TRACE (A) 10/25/2017 2022   HGBUR negative 04/01/2010 0000   BILIRUBINUR NEGATIVE 10/25/2017 2022   KETONESUR NEGATIVE 10/25/2017 2022   PROTEINUR NEGATIVE 10/25/2017 2022   UROBILINOGEN 0.2 10/25/2017 2022    NITRITE NEGATIVE 10/25/2017 2022   LEUKOCYTESUR SMALL (A) 10/25/2017 2022    STUDIES: No results found.   ASSESSMENT: 75 y.o. [place] woman status post right breast upper outer quadrant biopsy 2 on 06/13/2015 for morphologically identical  invasive ductal carcinomas measuring 1.9 and 1.2 cm, grade 2, estrogen and progesterone receptor strongly positive, HER-2 not amplified, with an MIB-1 of 5%  (1) bilateral lumpectomies 08/28/2015 showed  (a) on the right, apT2 pN0,stage IIA invasive ductal carcinoma, grade 1, with repeat HER-2 again negative  (b) on the left, apT1c pN0, stage IA invasive ductal carcinoma, grade 1, with repeat HER-2 again negative  (2) Oncotype score of 5 predicts a risk of outside the breast recurrence within 10 years of 5% if the patient's only systemic therapy is tamoxifen for 5 years. It also predicts no significant benefit from chemotherapy   (3) adjuvant radiation completed 11/05/2015 - 12/16/2015  Site/dose:    - Right Breast / 46 Gy in 23 fractions  - Right Breast Boost / 14 Gy in 7 fractions  - Left Breast / 46 Gy in 23 fractions  - Left Breast Boost / 14 Gy in 7 fractions  (4) anastrozole started 12/29/2015, completing 5 years May 2022  (a) Bone density 05/12/2016 shows a T score of -1.7  (b) Bone density 06/16/2018 shows a T-score of -1.8   PLAN: Apurva is now a little over 5 years out from definitive surgery for her breast cancer with no evidence of disease recurrence.  This is very favorable.  She has tolerated anastrozole well and she is completing 5 years this month.  A I do not think that she will need to continue beyond 5 years since her disease was node-negative and so when she runs out of her current supply she will not renew her medication.  At this point I am comfortable releasing her to her primary care physicians for further follow-up.  All she will need in terms of breast cancer follow-up is her yearly mammography and a yearly physician  breast exam  He still is interested in participating in our survivorship program I have made her an appointment with that in mind here in December after her November mammography  I will be glad to see her again at any point in the future if and when the need arises but as of now are making her no further routine appointments with me here.  Total encounter time 25 minutes.*   Allexus Ovens, Virgie Dad, MD  01/12/21 2:21 PM Medical Oncology and Hematology [facility] [place], [place] [address] Tel. [phone]    Fax. [phone]   I, Wilburn Mylar, am acting as scribe for Dr. Virgie Dad. Yahya Boldman.  I, Lurline Del MD, have reviewed the above documentation for accuracy and completeness, and I agree with the above.   *Total Encounter Time as defined by the Centers for Medicare and Medicaid Services includes, in addition to the face-to-face time of a patient visit (documented in the note above) non-face-to-face time: obtaining and reviewing outside history, ordering and reviewing medications, tests or procedures, care coordination (communications with other health care professionals or caregivers) and documentation in the medical record.

## 2021-01-12 ENCOUNTER — Other Ambulatory Visit: Payer: Self-pay

## 2021-01-12 ENCOUNTER — Inpatient Hospital Stay: Payer: Medicare Other | Attending: Oncology | Admitting: Oncology

## 2021-01-12 VITALS — BP 160/75 | HR 77 | Temp 97.7°F | Resp 17 | Wt 177.7 lb

## 2021-01-12 DIAGNOSIS — R232 Flushing: Secondary | ICD-10-CM | POA: Diagnosis not present

## 2021-01-12 DIAGNOSIS — Z809 Family history of malignant neoplasm, unspecified: Secondary | ICD-10-CM | POA: Diagnosis not present

## 2021-01-12 DIAGNOSIS — Z79811 Long term (current) use of aromatase inhibitors: Secondary | ICD-10-CM | POA: Insufficient documentation

## 2021-01-12 DIAGNOSIS — Z79899 Other long term (current) drug therapy: Secondary | ICD-10-CM | POA: Diagnosis not present

## 2021-01-12 DIAGNOSIS — Z801 Family history of malignant neoplasm of trachea, bronchus and lung: Secondary | ICD-10-CM | POA: Insufficient documentation

## 2021-01-12 DIAGNOSIS — Z885 Allergy status to narcotic agent status: Secondary | ICD-10-CM | POA: Diagnosis not present

## 2021-01-12 DIAGNOSIS — C50411 Malignant neoplasm of upper-outer quadrant of right female breast: Secondary | ICD-10-CM | POA: Diagnosis not present

## 2021-01-12 DIAGNOSIS — Z923 Personal history of irradiation: Secondary | ICD-10-CM | POA: Diagnosis not present

## 2021-01-12 DIAGNOSIS — Z170 Estrogen receptor positive status [ER+]: Secondary | ICD-10-CM | POA: Insufficient documentation

## 2021-01-12 DIAGNOSIS — Z833 Family history of diabetes mellitus: Secondary | ICD-10-CM | POA: Diagnosis not present

## 2021-01-12 DIAGNOSIS — C50312 Malignant neoplasm of lower-inner quadrant of left female breast: Secondary | ICD-10-CM

## 2021-01-12 DIAGNOSIS — Z886 Allergy status to analgesic agent status: Secondary | ICD-10-CM | POA: Insufficient documentation

## 2021-01-12 MED ORDER — VITAMIN D 25 MCG (1000 UNIT) PO TABS: 1000.0000 [IU] | ORAL_TABLET | Freq: Every day | ORAL | 4 refills | Status: DC

## 2021-01-13 ENCOUNTER — Telehealth: Payer: Self-pay | Admitting: Oncology

## 2021-01-13 NOTE — Telephone Encounter (Signed)
Scheduled appointment per 05/23 los. Left detailed message.

## 2021-02-06 ENCOUNTER — Other Ambulatory Visit (HOSPITAL_COMMUNITY): Payer: Self-pay

## 2021-03-07 ENCOUNTER — Other Ambulatory Visit (HOSPITAL_COMMUNITY): Payer: Self-pay

## 2021-03-13 ENCOUNTER — Other Ambulatory Visit (HOSPITAL_COMMUNITY): Payer: Self-pay

## 2021-03-13 MED ORDER — PREDNISONE 10 MG PO TABS
ORAL_TABLET | ORAL | 0 refills | Status: DC
  Filled 2021-03-13: qty 21, 6d supply, fill #0

## 2021-03-21 ENCOUNTER — Other Ambulatory Visit (HOSPITAL_COMMUNITY): Payer: Self-pay

## 2021-03-23 ENCOUNTER — Emergency Department (HOSPITAL_COMMUNITY): Payer: Medicare Other

## 2021-03-23 ENCOUNTER — Emergency Department (HOSPITAL_COMMUNITY)
Admission: EM | Admit: 2021-03-23 | Discharge: 2021-03-23 | Disposition: A | Discharge status: Home / Self Care | Payer: Medicare Other | Attending: Emergency Medicine | Admitting: Emergency Medicine

## 2021-03-23 ENCOUNTER — Other Ambulatory Visit: Payer: Self-pay

## 2021-03-23 ENCOUNTER — Encounter (HOSPITAL_COMMUNITY): Payer: Self-pay

## 2021-03-23 DIAGNOSIS — Z853 Personal history of malignant neoplasm of breast: Secondary | ICD-10-CM | POA: Insufficient documentation

## 2021-03-23 DIAGNOSIS — Z8616 Personal history of COVID-19: Secondary | ICD-10-CM | POA: Insufficient documentation

## 2021-03-23 DIAGNOSIS — I10 Essential (primary) hypertension: Secondary | ICD-10-CM | POA: Diagnosis not present

## 2021-03-23 DIAGNOSIS — R791 Abnormal coagulation profile: Secondary | ICD-10-CM | POA: Insufficient documentation

## 2021-03-23 DIAGNOSIS — Z79899 Other long term (current) drug therapy: Secondary | ICD-10-CM | POA: Insufficient documentation

## 2021-03-23 DIAGNOSIS — R42 Dizziness and giddiness: Secondary | ICD-10-CM | POA: Insufficient documentation

## 2021-03-23 LAB — CBC WITH DIFFERENTIAL/PLATELET
Abs Immature Granulocytes: 0.02 10*3/uL (ref 0.00–0.07)
Basophils Absolute: 0 10*3/uL (ref 0.0–0.1)
Basophils Relative: 1 %
Eosinophils Absolute: 0.1 10*3/uL (ref 0.0–0.5)
Eosinophils Relative: 2 %
HCT: 35.2 % — ABNORMAL LOW (ref 36.0–46.0)
Hemoglobin: 11.3 g/dL — ABNORMAL LOW (ref 12.0–15.0)
Immature Granulocytes: 0 %
Lymphocytes Relative: 31 %
Lymphs Abs: 1.9 10*3/uL (ref 0.7–4.0)
MCH: 27.4 pg (ref 26.0–34.0)
MCHC: 32.1 g/dL (ref 30.0–36.0)
MCV: 85.4 fL (ref 80.0–100.0)
Monocytes Absolute: 0.5 10*3/uL (ref 0.1–1.0)
Monocytes Relative: 9 %
Neutro Abs: 3.5 10*3/uL (ref 1.7–7.7)
Neutrophils Relative %: 57 %
Platelets: 260 10*3/uL (ref 150–400)
RBC: 4.12 MIL/uL (ref 3.87–5.11)
RDW: 12.8 % (ref 11.5–15.5)
WBC: 6.1 10*3/uL (ref 4.0–10.5)
nRBC: 0 % (ref 0.0–0.2)

## 2021-03-23 LAB — URINALYSIS, ROUTINE W REFLEX MICROSCOPIC
Bilirubin Urine: NEGATIVE
Glucose, UA: NEGATIVE mg/dL
Hgb urine dipstick: NEGATIVE
Ketones, ur: NEGATIVE mg/dL
Leukocytes,Ua: NEGATIVE
Nitrite: NEGATIVE
Protein, ur: NEGATIVE mg/dL
Specific Gravity, Urine: 1.025 (ref 1.005–1.030)
pH: 5.5 (ref 5.0–8.0)

## 2021-03-23 LAB — COMPREHENSIVE METABOLIC PANEL
ALT: 17 U/L (ref 0–44)
AST: 19 U/L (ref 15–41)
Albumin: 4 g/dL (ref 3.5–5.0)
Alkaline Phosphatase: 100 U/L (ref 38–126)
Anion gap: 8 (ref 5–15)
BUN: 28 mg/dL — ABNORMAL HIGH (ref 8–23)
CO2: 27 mmol/L (ref 22–32)
Calcium: 10.1 mg/dL (ref 8.9–10.3)
Chloride: 103 mmol/L (ref 98–111)
Creatinine, Ser: 1.04 mg/dL — ABNORMAL HIGH (ref 0.44–1.00)
GFR, Estimated: 57 mL/min — ABNORMAL LOW (ref >60)
Glucose, Bld: 106 mg/dL — ABNORMAL HIGH (ref 70–99)
Potassium: 3.8 mmol/L (ref 3.5–5.1)
Sodium: 138 mmol/L (ref 135–145)
Total Bilirubin: 0.7 mg/dL (ref 0.3–1.2)
Total Protein: 7.5 g/dL (ref 6.5–8.1)

## 2021-03-23 LAB — PROTIME-INR
INR: 1 (ref 0.8–1.2)
Prothrombin Time: 13.3 seconds (ref 11.4–15.2)

## 2021-03-23 NOTE — ED Provider Notes (Signed)
[facility] EMERGENCY DEPARTMENT Provider Note   CSN: [id number] Arrival date & time: 03/23/21  1651     History Chief Complaint  Patient presents with   Dizziness    Christina [name] is a 75 y.o. female.  HPI Patient presents with concern for months of intermittent disequilibrium.  She notes that this began sometime ago, "months."  When asked why she is here today she states that she did not have to go to work today, and sought evaluation.  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  She also walks, typically daily, and has been able to do so.  When asked about her disequilibrium or dizziness, she states that sometimes she feels unsteady, but has not had a fall, has no focal weakness, no confusion, disorientation, focal pain.    Past Medical History:  Diagnosis Date   Anemia    Breast cancer ([place]) 08/28/2015   Bilateral   Breast cancer of upper-outer quadrant of right female breast ([facility]) 06/23/2015   High cholesterol    Hx of radiation therapy 11/05/15- 12/16/15   Right Breast and Left Breast   Hypertension    Personal history of radiation therapy    Right Breast Cancer   Vitamin D deficiency 06/29/2018    Patient Active Problem List   Diagnosis Date Noted   History of COVID-19 09/22/2020   Vitamin D deficiency 06/29/2018   Malignant neoplasm of lower-inner quadrant of left breast in female, estrogen receptor positive ([facility]) 10/20/2015   Malignant neoplasm of upper-outer quadrant of right breast in female, estrogen receptor positive ([facility]) 06/23/2015   CONSTIPATION 04/01/2010   PALPITATIONS 04/01/2010   URINALYSIS, ABNORMAL 04/01/2010   ANEMIA 05/22/2009   DENTAL CARIES 04/21/2009   Elevated LDL cholesterol level 03/18/2008   Essential hypertension 08/23/2006    Past Surgical History:  Procedure Laterality Date   BREAST LUMPECTOMY Left 08/28/2015   BREAST LUMPECTOMY Right 08/28/2015   BREAST LUMPECTOMY WITH NEEDLE LOCALIZATION AND  AXILLARY SENTINEL LYMPH NODE BX Bilateral 08/28/2015   Procedure: BILATERAL BREAST LUMPECTOMY WITH BILATERAL  NEEDLE LOCALIZATION AND BILATERAL AXILLARY SENTINEL LYMPH NODE BX;  Surgeon: Erroll Luna, MD;  Location: [facility];  Service: General;  Laterality: Bilateral;     OB History   No obstetric history on file.     Family History  Problem Relation Age of Onset   Cancer Mother    Diabetes Sister    Lung cancer Brother    Diabetes Brother     Social History   Tobacco Use   Smoking status: Never   Smokeless tobacco: Never  Substance Use Topics   Alcohol use: No   Drug use: No    Home Medications Prior to Admission medications   Medication Sig Start Date End Date Taking? Authorizing Provider  cholecalciferol (VITAMIN D3) 25 MCG (1000 UNIT) tablet Take 1 tablet (1,000 Units total) by mouth daily. 01/12/21   Magrinat, Virgie Dad, MD  fexofenadine (ALLEGRA) 60 MG tablet Take 1 tablet (60 mg total) by mouth 2 (two) times daily. 12/11/17   Tereasa Coop, PA-C  losartan-hydrochlorothiazide (HYZAAR) 100-25 MG tablet Take 1 tablet by mouth daily. 12/31/20     predniSONE (DELTASONE) 10 MG tablet Take 6 tablets on day 1 then decrease by 1 tablet each day until finished (6-5-4-3-2-1) 03/12/21     simvastatin (ZOCOR) 10 MG tablet TAKE 1 TABLET BY MOUTH AT BEDTIME 10/20/20 10/20/21  Park Meo T, PA-C  triamcinolone ointment (  KENALOG) 0.1 % APPLY TO AFFECTED AREA TWICE DAILY. 05/23/20 05/23/21  Raylene Everts, MD    Allergies    Onion, Aspirin, and Hydrocodone  Review of Systems   Review of Systems  Constitutional:        Per HPI, otherwise negative  HENT:         Per HPI, otherwise negative  Respiratory:         Per HPI, otherwise negative  Cardiovascular:        Per HPI, otherwise negative  Gastrointestinal:  Negative for nausea.  Endocrine:       Negative aside from HPI  Genitourinary:        Neg aside from HPI   Musculoskeletal:        Per HPI, otherwise negative   Skin: Negative.   Neurological:  Positive for dizziness. Negative for syncope, weakness and headaches.   Physical Exam Updated Vital Signs BP 126/70   Pulse 82   Temp 98.1 F (36.7 C)   Resp 16   Ht '5\' 6"'$  (1.676 m)   Wt 81.6 kg   SpO2 98%   BMI 29.05 kg/m   Physical Exam Vitals and nursing note reviewed.  Constitutional:      General: She is not in acute distress.    Appearance: She is well-developed.  HENT:     Head: Normocephalic and atraumatic.  Eyes:     Conjunctiva/sclera: Conjunctivae normal.  Cardiovascular:     Rate and Rhythm: Normal rate and regular rhythm.  Pulmonary:     Effort: Pulmonary effort is normal. No respiratory distress.     Breath sounds: Normal breath sounds. No stridor.  Abdominal:     General: There is no distension.  Skin:    General: Skin is warm and dry.  Neurological:     Mental Status: She is alert and oriented to person, place, and time.     Cranial Nerves: No cranial nerve deficit.     Motor: No weakness, tremor, atrophy, abnormal muscle tone, seizure activity or pronator drift.    ED Results / Procedures / Treatments   Labs (all labs ordered are listed, but only abnormal results are displayed) Labs Reviewed  COMPREHENSIVE METABOLIC PANEL - Abnormal; Notable for the following components:      Result Value   Glucose, Bld 106 (*)    BUN 28 (*)    Creatinine, Ser 1.04 (*)    GFR, Estimated 57 (*)    All other components within normal limits  CBC WITH DIFFERENTIAL/PLATELET - Abnormal; Notable for the following components:   Hemoglobin 11.3 (*)    HCT 35.2 (*)    All other components within normal limits  URINALYSIS, ROUTINE W REFLEX MICROSCOPIC  PROTIME-INR    EKG None  Radiology CT HEAD WO CONTRAST (5MM)  Result Date: 03/23/2021 CLINICAL DATA:  Dizziness for 2 weeks. EXAM: CT HEAD WITHOUT CONTRAST TECHNIQUE: Contiguous axial images were obtained from the base of the skull through the vertex without intravenous contrast.  COMPARISON:  02/10/2005 FINDINGS: Brain: There is no evidence of an acute infarct, intracranial hemorrhage, mass, midline shift, or extra-axial fluid collection. The ventricles and sulci are normal. Vascular: Calcified atherosclerosis at the skull base. No hyperdense vessel. Skull: No fracture or suspicious osseous lesion. Hyperostosis in the frontal and temporal regions. Sinuses/Orbits: Visualized paranasal sinuses and mastoid air cells are clear. Unremarkable orbits. Other: None. IMPRESSION: Unremarkable CT appearance of the brain. Electronically Signed   By: Seymour Bars.D.  On: 03/23/2021 19:37    Procedures Procedures   Medications Ordered in ED Medications - No data to display  ED Course  I have reviewed the triage vital signs and the nursing notes.  Pertinent labs & imaging results that were available during my care of the patient were reviewed by me and considered in my medical decision making (see chart for details).  Generally well-appearing female presents with concern for ongoing disequilibrium for months.  She has no focal neurologic deficits, is awake, alert, sitting upright, speaking clearly, and has unremarkable vital signs.  Patient CT reviewed, labs reviewed, reassuring, no evidence for mass, evidence for acute infarct, given the chronicity of her disequilibrium she is appropriate for outpatient follow-up to which she is comfortable.  Final Clinical Impression(s) / ED Diagnoses Final diagnoses:  Dizziness     Carmin Muskrat, MD 03/23/21 [phone]

## 2021-03-23 NOTE — ED Notes (Signed)
Patient verbalizes understanding of discharge instructions. Opportunity for questioning and answers were provided. Armband removed by staff, pt discharged from ED via wheelchair.  

## 2021-03-23 NOTE — ED Notes (Signed)
[name], Daughter, called and updated that pt was being discharged. States she would be here in about 50mns. Pt waiting on family member in the lobby.

## 2021-03-23 NOTE — Discharge Instructions (Addendum)
As discussed, your evaluation today has been largely reassuring.  But, it is important that you monitor your condition carefully, and do not hesitate to return to the ED if you develop new, or concerning changes in your condition. ? ?Otherwise, please follow-up with your physician for appropriate ongoing care. ? ?

## 2021-03-23 NOTE — ED Triage Notes (Signed)
Pt reports ongoing dizziness for two weeks. Reports symptoms are interfering with daily activities. Denies any syncopal episodes, CP, SOB. Also c/o intermittent knee pain.

## 2021-03-23 NOTE — ED Provider Notes (Signed)
Emergency Medicine Provider Triage Evaluation Note  Christina [name] , a 75 y.o. female  was evaluated in triage.  Pt complains of disequilibrium for the last 2 weeks, feeling she cannot walk properly without feeling that that she may fall though she has never fallen.  Does not feel that the room spinning just that she is not able to walk a straight line..  Review of Systems  Positive: Disequilibrium Negative: Weakness, blurring, double vision, syncope, chest pain, shortness of breath  Physical Exam  BP (!) 163/90 (BP Location: Left Arm)   Pulse 81   Temp 99 F (37.2 C) (Oral)   Resp 18   Ht '5\' 6"'$  (1.676 m)   Wt 81.6 kg   SpO2 100%   BMI 29.05 kg/m  Gen:   Awake, no distress   Resp:  Normal effort  MSK:   Moves extremities without difficulty  Other:  Notable deficit on neurologic exam, PERRL, EOMI.  Medical Decision Making  Medically screening exam initiated at 5:45 PM.  Appropriate orders placed.  Christina [name] was informed that the remainder of the evaluation will be completed by another provider, this initial triage assessment does not replace that evaluation, and the importance of remaining in the ED until their evaluation is complete.  This chart was dictated using voice recognition software, Dragon. Despite the best efforts of this provider to proofread and correct errors, errors may still occur which can change documentation meaning.    Emeline Darling, PA-C 03/23/21 [place], DO 03/23/21 2320

## 2021-03-23 NOTE — ED Notes (Signed)
Patient remains on hall stretcher awaiting re-evaluation and dispositions. No complaints are offered. VS monitored and recorded and are WNL.

## 2021-03-23 NOTE — ED Notes (Signed)
Patient denies dizziness at this time. Patient ambulated from bathroom in blue zone to hall bed 20 with steady independent gait and without incident. Awaiting re-evaluation and disposition by ED provider.

## 2021-04-04 ENCOUNTER — Other Ambulatory Visit (HOSPITAL_COMMUNITY): Payer: Self-pay

## 2021-05-06 ENCOUNTER — Other Ambulatory Visit (HOSPITAL_COMMUNITY): Payer: Self-pay

## 2021-05-06 MED FILL — Simvastatin Tab 10 MG: ORAL | 30 days supply | Qty: 30 | Fill #0 | Status: CN

## 2021-05-14 ENCOUNTER — Other Ambulatory Visit (HOSPITAL_COMMUNITY): Payer: Self-pay

## 2021-05-29 ENCOUNTER — Other Ambulatory Visit: Payer: Self-pay | Admitting: Physician Assistant

## 2021-05-29 DIAGNOSIS — Z9889 Other specified postprocedural states: Secondary | ICD-10-CM

## 2021-06-03 ENCOUNTER — Other Ambulatory Visit (HOSPITAL_COMMUNITY): Payer: Self-pay

## 2021-06-03 MED ORDER — AMLODIPINE BESYLATE 5 MG PO TABS
5.0000 mg | ORAL_TABLET | Freq: Every day | ORAL | 0 refills | Status: DC
  Filled 2021-06-03: qty 90, 90d supply, fill #0

## 2021-06-05 ENCOUNTER — Other Ambulatory Visit (HOSPITAL_COMMUNITY): Payer: Self-pay

## 2021-06-19 ENCOUNTER — Other Ambulatory Visit (HOSPITAL_COMMUNITY): Payer: Self-pay

## 2021-06-19 MED ORDER — CELECOXIB 200 MG PO CAPS
ORAL_CAPSULE | ORAL | 0 refills | Status: DC
  Filled 2021-06-19: qty 30, 15d supply, fill #0

## 2021-06-26 ENCOUNTER — Other Ambulatory Visit: Payer: Self-pay | Admitting: Adult Health

## 2021-06-26 DIAGNOSIS — Z9889 Other specified postprocedural states: Secondary | ICD-10-CM

## 2021-06-29 ENCOUNTER — Other Ambulatory Visit (HOSPITAL_COMMUNITY): Payer: Self-pay

## 2021-07-11 ENCOUNTER — Other Ambulatory Visit: Payer: Self-pay | Admitting: Adult Health

## 2021-07-11 ENCOUNTER — Ambulatory Visit
Admission: RE | Admit: 2021-07-11 | Discharge: 2021-07-11 | Disposition: A | Discharge status: Home / Self Care | Payer: Medicare Other | Source: Ambulatory Visit | Attending: Adult Health | Admitting: Adult Health

## 2021-07-11 ENCOUNTER — Other Ambulatory Visit: Payer: Self-pay

## 2021-07-11 DIAGNOSIS — Z9889 Other specified postprocedural states: Secondary | ICD-10-CM

## 2021-07-23 ENCOUNTER — Inpatient Hospital Stay: Payer: Medicare Other | Admitting: Adult Health

## 2021-07-25 ENCOUNTER — Other Ambulatory Visit (HOSPITAL_COMMUNITY): Payer: Self-pay

## 2021-07-28 ENCOUNTER — Other Ambulatory Visit (HOSPITAL_COMMUNITY): Payer: Self-pay

## 2021-07-28 MED ORDER — AMLODIPINE BESYLATE 10 MG PO TABS
ORAL_TABLET | ORAL | 1 refills | Status: DC
  Filled 2021-07-28 – 2021-10-22 (×2): qty 90, 90d supply, fill #0
  Filled 2022-01-22: qty 30, 30d supply, fill #1
  Filled 2022-02-20: qty 30, 30d supply, fill #2
  Filled 2022-03-26: qty 30, 30d supply, fill #3
  Filled 2022-06-04: qty 30, 30d supply, fill #4

## 2021-07-28 MED ORDER — AMLODIPINE BESYLATE 10 MG PO TABS
ORAL_TABLET | ORAL | 1 refills | Status: DC
  Filled 2021-07-28: qty 90, 90d supply, fill #0

## 2021-07-29 ENCOUNTER — Other Ambulatory Visit (HOSPITAL_COMMUNITY): Payer: Self-pay

## 2021-08-21 ENCOUNTER — Other Ambulatory Visit (HOSPITAL_COMMUNITY): Payer: Self-pay

## 2021-08-21 MED ORDER — OLMESARTAN MEDOXOMIL 20 MG PO TABS
20.0000 mg | ORAL_TABLET | Freq: Every day | ORAL | 3 refills | Status: DC
  Filled 2021-08-21 – 2021-08-25 (×2): qty 30, 30d supply, fill #0
  Filled 2021-09-24: qty 30, 30d supply, fill #1
  Filled 2021-10-22: qty 30, 30d supply, fill #2
  Filled 2021-11-21: qty 30, 30d supply, fill #3
  Filled 2021-12-23: qty 30, 30d supply, fill #4
  Filled 2022-01-22: qty 30, 30d supply, fill #5
  Filled 2022-02-20: qty 30, 30d supply, fill #6
  Filled 2022-03-26: qty 30, 30d supply, fill #7
  Filled 2022-04-22: qty 30, 30d supply, fill #8
  Filled 2022-05-27: qty 30, 30d supply, fill #9
  Filled 2022-07-02: qty 30, 30d supply, fill #10
  Filled 2022-08-03: qty 30, 30d supply, fill #11

## 2021-08-25 ENCOUNTER — Other Ambulatory Visit (HOSPITAL_COMMUNITY): Payer: Self-pay

## 2021-08-28 ENCOUNTER — Inpatient Hospital Stay: Payer: Medicare Other | Attending: Adult Health | Admitting: Adult Health

## 2021-08-28 DIAGNOSIS — Z170 Estrogen receptor positive status [ER+]: Secondary | ICD-10-CM

## 2021-08-28 DIAGNOSIS — C50411 Malignant neoplasm of upper-outer quadrant of right female breast: Secondary | ICD-10-CM

## 2021-08-28 NOTE — Progress Notes (Signed)
Patient did not show for their January 6 appointment.  A no-show letter has been sent.

## 2021-09-24 ENCOUNTER — Other Ambulatory Visit (HOSPITAL_COMMUNITY): Payer: Self-pay

## 2021-10-22 ENCOUNTER — Other Ambulatory Visit (HOSPITAL_COMMUNITY): Payer: Self-pay

## 2021-11-21 ENCOUNTER — Other Ambulatory Visit (HOSPITAL_COMMUNITY): Payer: Self-pay

## 2021-12-02 ENCOUNTER — Encounter (HOSPITAL_COMMUNITY): Payer: Self-pay

## 2021-12-02 ENCOUNTER — Ambulatory Visit (HOSPITAL_COMMUNITY)
Admission: EM | Admit: 2021-12-02 | Discharge: 2021-12-02 | Disposition: A | Discharge status: Home / Self Care | Payer: Medicare Other | Attending: Physician Assistant | Admitting: Physician Assistant

## 2021-12-02 DIAGNOSIS — R030 Elevated blood-pressure reading, without diagnosis of hypertension: Secondary | ICD-10-CM

## 2021-12-02 DIAGNOSIS — J302 Other seasonal allergic rhinitis: Secondary | ICD-10-CM | POA: Diagnosis not present

## 2021-12-02 DIAGNOSIS — H1013 Acute atopic conjunctivitis, bilateral: Secondary | ICD-10-CM | POA: Diagnosis not present

## 2021-12-02 MED ORDER — OLOPATADINE HCL 0.1 % OP SOLN
1.0000 [drp] | Freq: Two times a day (BID) | OPHTHALMIC | 0 refills | Status: DC
  Filled 2021-12-02: qty 5, 50d supply, fill #0

## 2021-12-02 MED ORDER — FEXOFENADINE HCL 60 MG PO TABS
60.0000 mg | ORAL_TABLET | Freq: Two times a day (BID) | ORAL | 0 refills | Status: DC
  Filled 2021-12-02: qty 60, 30d supply, fill #0

## 2021-12-02 NOTE — Discharge Instructions (Signed)
I believe that allergies are contributing to your symptoms.  Start fexofenadine (Allegra) as prescribed.  I have also called in an allergy eyedrop for symptom relief.  Use warm compress to keep area clean.  You can use lubricating eyedrops for additional symptom relief but should avoid anything that says redness relief or has vasoconstrictors.  If your symptoms or not improving you need to be reevaluated.  If at any point anything worsens and you have pain, visual disturbance, drainage from the eye, light sensitivity, headache, dizziness you need to be seen immediately. ? ?Your heart rate normalized in clinic.  Your blood pressure remains somewhat elevated.  Please monitor this at home.  Avoid NSAIDs including aspirin, ibuprofen/Advil, naproxen/Aleve.  Avoid caffeine, sodium, decongestants.  Follow-up with your primary care provider.  If you develop any chest pain, shortness of breath, headache, visual change in the setting of high blood pressure you need to go to the emergency room. ?

## 2021-12-02 NOTE — ED Provider Notes (Signed)
?[facility] ? ? ? ?CSN: [id number] ?Arrival date & time: 12/02/21  1935 ? ? ?  ? ?History   ?Chief Complaint ?Chief Complaint  ?Patient presents with  ? Eye Problem  ? ? ?HPI ?Christina [name] is a 76 y.o. female.  ? ?Patient presents today with a several day history of eye irritation and redness.  She reports this is bilateral and irritation is described as an itching sensation.  She is exposed to a lot of pollen and outdoor allergens.  She has not tried any over-the-counter medication for symptom management.  She denies formal diagnosis of seasonal allergies but is taking Allegra daily.  She does not wear glasses or contacts.  She denies any visual change.  She does report some mild nasal congestion.  She denies any headache, dizziness, nausea, vomiting.  Denies any exposure to chemicals or fine particulate matter.  Denies any foreign body sensation in the eye. ? ?Patient's blood pressure and heart rate were elevated in clinic today.  She attributed this to anxiety related to going to the doctor.  She denies any chest pain, shortness of breath, headache, vision change, lightheadedness. ? ? ?Past Medical History:  ?Diagnosis Date  ? Anemia   ? Breast cancer ([place]) 08/28/2015  ? Bilateral  ? Breast cancer of upper-outer quadrant of right female breast ([place]) 06/23/2015  ? High cholesterol   ? Hx of radiation therapy 11/05/15- 12/16/15  ? Right Breast and Left Breast  ? Hypertension   ? Personal history of radiation therapy   ? Right Breast Cancer  ? Vitamin D deficiency 06/29/2018  ? ? ?Patient Active Problem List  ? Diagnosis Date Noted  ? History of COVID-19 09/22/2020  ? Vitamin D deficiency 06/29/2018  ? Malignant neoplasm of lower-inner quadrant of left breast in female, estrogen receptor positive ([place]) 10/20/2015  ? Malignant neoplasm of upper-outer quadrant of right breast in female, estrogen receptor positive ([place]) 06/23/2015  ? CONSTIPATION 04/01/2010  ? PALPITATIONS 04/01/2010  ? URINALYSIS, ABNORMAL  04/01/2010  ? ANEMIA 05/22/2009  ? DENTAL CARIES 04/21/2009  ? Elevated LDL cholesterol level 03/18/2008  ? Essential hypertension 08/23/2006  ? ? ?Past Surgical History:  ?Procedure Laterality Date  ? BREAST LUMPECTOMY Left 08/28/2015  ? BREAST LUMPECTOMY Right 08/28/2015  ? BREAST LUMPECTOMY WITH NEEDLE LOCALIZATION AND AXILLARY SENTINEL LYMPH NODE BX Bilateral 08/28/2015  ? Procedure: BILATERAL BREAST LUMPECTOMY WITH BILATERAL  NEEDLE LOCALIZATION AND BILATERAL AXILLARY SENTINEL LYMPH NODE BX;  Surgeon: Erroll Luna, MD;  Location: [facility];  Service: General;  Laterality: Bilateral;  ? ? ?OB History   ?No obstetric history on file. ?  ? ? ? ?Home Medications   ? ?Prior to Admission medications   ?Medication Sig Start Date End Date Taking? Authorizing Provider  ?olopatadine (PATADAY) 0.1 % ophthalmic solution Place 1 drop into both eyes 2 (two) times daily. 12/02/21  Yes Issaac Shipper K, PA-C  ?amLODipine (NORVASC) 10 MG tablet Take 1 tablet by mouth daily 07/28/21     ?amLODipine (NORVASC) 10 MG tablet Take 1 tablet by mouth daily (dose change). 07/28/21   Park Meo T, PA-C  ?amLODipine (NORVASC) 5 MG tablet Take 1 tablet (5 mg total) by mouth daily. 06/03/21     ?celecoxib (CELEBREX) 200 MG capsule Take 1 capsule (200 mg total) by mouth 2 times daily as needed for pain. 06/19/21     ?cholecalciferol (VITAMIN D3) 25 MCG (1000 UNIT) tablet Take 1 tablet (1,000 Units total) by mouth daily. 01/12/21  Magrinat, Virgie Dad, MD  ?fexofenadine (ALLEGRA) 60 MG tablet Take 1 tablet (60 mg total) by mouth 2 (two) times daily. 12/02/21   Markela Wee, Derry Skill, PA-C  ?olmesartan (BENICAR) 20 MG tablet Take 1 tablet (20 mg total) by mouth daily. 08/21/21     ?predniSONE (DELTASONE) 10 MG tablet Take 6 tablets on day 1 then decrease by 1 tablet each day until finished (6-5-4-3-2-1) 03/12/21     ?simvastatin (ZOCOR) 10 MG tablet TAKE 1 TABLET BY MOUTH AT BEDTIME 10/20/20 10/20/21  Park Meo T, PA-C  ?losartan-hydrochlorothiazide  (HYZAAR) 100-25 MG tablet Take 1 tablet by mouth daily. 12/31/20 06/03/21    ? ? ?Family History ?Family History  ?Problem Relation Age of Onset  ? Cancer Mother   ? Diabetes Sister   ? Lung cancer Brother   ? Diabetes Brother   ? ? ?Social History ?Social History  ? ?Tobacco Use  ? Smoking status: Never  ? Smokeless tobacco: Never  ?Substance Use Topics  ? Alcohol use: No  ? Drug use: No  ? ? ? ?Allergies   ?Onion, Aspirin, and Hydrocodone ? ? ?Review of Systems ?Review of Systems  ?Constitutional:  Negative for activity change, appetite change, fatigue and fever.  ?Eyes:  Positive for redness and itching. Negative for photophobia, pain, discharge and visual disturbance.  ?Respiratory:  Negative for cough and shortness of breath.   ?Cardiovascular:  Negative for chest pain and palpitations.  ?Gastrointestinal:  Negative for abdominal pain, diarrhea, nausea and vomiting.  ?Neurological:  Negative for dizziness, light-headedness and headaches.  ? ? ?Physical Exam ?Triage Vital Signs ?ED Triage Vitals  ?Enc Vitals Group  ?   BP 12/02/21 2022 (!) 150/77  ?   Pulse Rate 12/02/21 2022 (!) 112  ?   Resp 12/02/21 2022 16  ?   Temp 12/02/21 2023 98.4 ?F (36.9 ?C)  ?   Temp Source 12/02/21 2023 Oral  ?   SpO2 12/02/21 2022 100 %  ?   Weight --   ?   Height --   ?   Head Circumference --   ?   Peak Flow --   ?   Pain Score 12/02/21 2021 0  ?   Pain Loc --   ?   Pain Edu? --   ?   Excl. in [place]? --   ? ?No data found. ? ?Updated Vital Signs ?BP (!) 163/89   Pulse 88   Temp 98.5 ?F (36.9 ?C)   Resp 18   SpO2 98%  ? ?Visual Acuity ?Right Eye Distance:   ?Left Eye Distance:   ?Bilateral Distance:   ? ?Right Eye Near:   ?Left Eye Near:    ?Bilateral Near:    ? ?Physical Exam ?Vitals reviewed.  ?Constitutional:   ?   General: She is awake. She is not in acute distress. ?   Appearance: Normal appearance. She is well-developed. She is not ill-appearing.  ?   Comments: Very pleasant female appears stated age in no acute distress  sitting comfortably in exam room  ?HENT:  ?   Head: Normocephalic and atraumatic.  ?   Right Ear: Tympanic membrane, ear canal and external ear normal. Tympanic membrane is not erythematous or bulging.  ?   Left Ear: Tympanic membrane, ear canal and external ear normal. Tympanic membrane is not erythematous or bulging.  ?   Nose:  ?   Right Sinus: No maxillary sinus tenderness or frontal sinus tenderness.  ?   Left  Sinus: No maxillary sinus tenderness or frontal sinus tenderness.  ?   Mouth/Throat:  ?   Pharynx: Uvula midline. No oropharyngeal exudate or posterior oropharyngeal erythema.  ?Eyes:  ?   Extraocular Movements: Extraocular movements intact.  ?   Conjunctiva/sclera:  ?   Right eye: Right conjunctiva is injected.  ?   Left eye: Left conjunctiva is injected.  ?   Pupils: Pupils are equal, round, and reactive to light.  ?   Comments: Exotropia of left eye noted on exam.  ?Cardiovascular:  ?   Rate and Rhythm: Normal rate and regular rhythm.  ?   Pulses:     ?     Radial pulses are 2+ on the right side and 2+ on the left side.  ?   Heart sounds: Normal heart sounds, S1 normal and S2 normal. No murmur heard. ?Pulmonary:  ?   Effort: Pulmonary effort is normal.  ?   Breath sounds: Normal breath sounds. No wheezing, rhonchi or rales.  ?   Comments: Clear to auscultation bilaterally ?Psychiatric:     ?   Behavior: Behavior is cooperative.  ? ? ? ?UC Treatments / Results  ?Labs ?(all labs ordered are listed, but only abnormal results are displayed) ?Labs Reviewed - No data to display ? ?EKG ? ? ?Radiology ?No results found. ? ?Procedures ?Procedures (including critical care time) ? ?Medications Ordered in UC ?Medications - No data to display ? ?Initial Impression / Assessment and Plan / UC Course  ?I have reviewed the triage vital signs and the nursing notes. ? ?Pertinent labs & imaging results that were available during my care of the patient were reviewed by me and considered in my medical decision making (see  chart for details). ? ?  ? ?Patient denies any foreign body sensation or known ocular injury so fluorescein staining was deferred.  Suspect arthritis given clinical presentation.  She was started on Pataday drops

## 2021-12-02 NOTE — ED Triage Notes (Signed)
Pt presents with c/o bilateral eye redness. Pt states she think she has allergies. Stats she [place] around [place] lot [place].  ?

## 2021-12-03 ENCOUNTER — Other Ambulatory Visit (HOSPITAL_COMMUNITY): Payer: Self-pay

## 2021-12-23 ENCOUNTER — Other Ambulatory Visit (HOSPITAL_COMMUNITY): Payer: Self-pay

## 2022-01-22 ENCOUNTER — Other Ambulatory Visit (HOSPITAL_COMMUNITY): Payer: Self-pay

## 2022-02-12 ENCOUNTER — Other Ambulatory Visit (HOSPITAL_COMMUNITY): Payer: Self-pay

## 2022-02-20 ENCOUNTER — Other Ambulatory Visit (HOSPITAL_COMMUNITY): Payer: Self-pay

## 2022-03-02 ENCOUNTER — Emergency Department (HOSPITAL_COMMUNITY): Payer: Medicare Other

## 2022-03-02 ENCOUNTER — Encounter (HOSPITAL_COMMUNITY): Payer: Self-pay | Admitting: Emergency Medicine

## 2022-03-02 ENCOUNTER — Inpatient Hospital Stay (HOSPITAL_COMMUNITY)
Admission: EM | Admit: 2022-03-02 | Discharge: 2022-03-06 | DRG: 853 | Disposition: A | Discharge status: Home / Self Care | Payer: Medicare Other | Source: Ambulatory Visit | Attending: Internal Medicine | Admitting: Internal Medicine

## 2022-03-02 ENCOUNTER — Other Ambulatory Visit: Payer: Self-pay

## 2022-03-02 ENCOUNTER — Ambulatory Visit (HOSPITAL_COMMUNITY)
Admission: EM | Admit: 2022-03-02 | Discharge: 2022-03-02 | Disposition: A | Discharge status: Home / Self Care | Payer: Medicare Other

## 2022-03-02 ENCOUNTER — Encounter (HOSPITAL_COMMUNITY): Payer: Self-pay

## 2022-03-02 DIAGNOSIS — K5900 Constipation, unspecified: Secondary | ICD-10-CM | POA: Diagnosis present

## 2022-03-02 DIAGNOSIS — E7800 Pure hypercholesterolemia, unspecified: Secondary | ICD-10-CM | POA: Diagnosis present

## 2022-03-02 DIAGNOSIS — Z885 Allergy status to narcotic agent status: Secondary | ICD-10-CM

## 2022-03-02 DIAGNOSIS — Z888 Allergy status to other drugs, medicaments and biological substances status: Secondary | ICD-10-CM

## 2022-03-02 DIAGNOSIS — K3580 Unspecified acute appendicitis: Secondary | ICD-10-CM | POA: Diagnosis present

## 2022-03-02 DIAGNOSIS — R6510 Systemic inflammatory response syndrome (SIRS) of non-infectious origin without acute organ dysfunction: Secondary | ICD-10-CM | POA: Diagnosis not present

## 2022-03-02 DIAGNOSIS — R638 Other symptoms and signs concerning food and fluid intake: Secondary | ICD-10-CM | POA: Diagnosis not present

## 2022-03-02 DIAGNOSIS — A419 Sepsis, unspecified organism: Principal | ICD-10-CM | POA: Diagnosis present

## 2022-03-02 DIAGNOSIS — R9431 Abnormal electrocardiogram [ECG] [EKG]: Secondary | ICD-10-CM | POA: Diagnosis present

## 2022-03-02 DIAGNOSIS — Z853 Personal history of malignant neoplasm of breast: Secondary | ICD-10-CM

## 2022-03-02 DIAGNOSIS — I10 Essential (primary) hypertension: Secondary | ICD-10-CM | POA: Diagnosis present

## 2022-03-02 DIAGNOSIS — K353 Acute appendicitis with localized peritonitis, without perforation or gangrene: Principal | ICD-10-CM

## 2022-03-02 DIAGNOSIS — R1030 Lower abdominal pain, unspecified: Secondary | ICD-10-CM

## 2022-03-02 DIAGNOSIS — N179 Acute kidney failure, unspecified: Secondary | ICD-10-CM | POA: Diagnosis present

## 2022-03-02 DIAGNOSIS — R6520 Severe sepsis without septic shock: Secondary | ICD-10-CM

## 2022-03-02 DIAGNOSIS — C181 Malignant neoplasm of appendix: Secondary | ICD-10-CM | POA: Diagnosis present

## 2022-03-02 DIAGNOSIS — Z79899 Other long term (current) drug therapy: Secondary | ICD-10-CM

## 2022-03-02 DIAGNOSIS — Z91018 Allergy to other foods: Secondary | ICD-10-CM

## 2022-03-02 DIAGNOSIS — K429 Umbilical hernia without obstruction or gangrene: Secondary | ICD-10-CM | POA: Diagnosis present

## 2022-03-02 DIAGNOSIS — K3532 Acute appendicitis with perforation and localized peritonitis, without abscess: Secondary | ICD-10-CM | POA: Diagnosis present

## 2022-03-02 DIAGNOSIS — Z23 Encounter for immunization: Secondary | ICD-10-CM

## 2022-03-02 DIAGNOSIS — Z20822 Contact with and (suspected) exposure to covid-19: Secondary | ICD-10-CM | POA: Diagnosis present

## 2022-03-02 DIAGNOSIS — K529 Noninfective gastroenteritis and colitis, unspecified: Secondary | ICD-10-CM | POA: Diagnosis present

## 2022-03-02 DIAGNOSIS — Z923 Personal history of irradiation: Secondary | ICD-10-CM

## 2022-03-02 LAB — CBC
HCT: 37 % (ref 36.0–46.0)
Hemoglobin: 12.1 g/dL (ref 12.0–15.0)
MCH: 27 pg (ref 26.0–34.0)
MCHC: 32.7 g/dL (ref 30.0–36.0)
MCV: 82.6 fL (ref 80.0–100.0)
Platelets: 279 10*3/uL (ref 150–400)
RBC: 4.48 MIL/uL (ref 3.87–5.11)
RDW: 12.9 % (ref 11.5–15.5)
WBC: 15.8 10*3/uL — ABNORMAL HIGH (ref 4.0–10.5)
nRBC: 0 % (ref 0.0–0.2)

## 2022-03-02 LAB — COMPREHENSIVE METABOLIC PANEL
ALT: 19 U/L (ref 0–44)
AST: 27 U/L (ref 15–41)
Albumin: 4.1 g/dL (ref 3.5–5.0)
Alkaline Phosphatase: 120 U/L (ref 38–126)
Anion gap: 11 (ref 5–15)
BUN: 19 mg/dL (ref 8–23)
CO2: 22 mmol/L (ref 22–32)
Calcium: 9.2 mg/dL (ref 8.9–10.3)
Chloride: 105 mmol/L (ref 98–111)
Creatinine, Ser: 1.44 mg/dL — ABNORMAL HIGH (ref 0.44–1.00)
GFR, Estimated: 38 mL/min — ABNORMAL LOW (ref >60)
Glucose, Bld: 117 mg/dL — ABNORMAL HIGH (ref 70–99)
Potassium: 3.8 mmol/L (ref 3.5–5.1)
Sodium: 138 mmol/L (ref 135–145)
Total Bilirubin: 1.4 mg/dL — ABNORMAL HIGH (ref 0.3–1.2)
Total Protein: 8.1 g/dL (ref 6.5–8.1)

## 2022-03-02 LAB — URINALYSIS, ROUTINE W REFLEX MICROSCOPIC
Bilirubin Urine: NEGATIVE
Glucose, UA: NEGATIVE mg/dL
Ketones, ur: NEGATIVE mg/dL
Leukocytes,Ua: NEGATIVE
Nitrite: NEGATIVE
Protein, ur: NEGATIVE mg/dL
Specific Gravity, Urine: 1.004 — ABNORMAL LOW (ref 1.005–1.030)
pH: 6 (ref 5.0–8.0)

## 2022-03-02 LAB — LIPASE, BLOOD: Lipase: 24 U/L (ref 11–51)

## 2022-03-02 LAB — LACTIC ACID, PLASMA: Lactic Acid, Venous: 1.7 mmol/L (ref 0.5–1.9)

## 2022-03-02 LAB — RESP PANEL BY RT-PCR (FLU A&B, COVID) ARPGX2
Influenza A by PCR: NEGATIVE
Influenza B by PCR: NEGATIVE
SARS Coronavirus 2 by RT PCR: NEGATIVE

## 2022-03-02 LAB — APTT: aPTT: 29 seconds (ref 24–36)

## 2022-03-02 LAB — PROTIME-INR
INR: 1.1 (ref 0.8–1.2)
Prothrombin Time: 14.3 seconds (ref 11.4–15.2)

## 2022-03-02 MED ORDER — METRONIDAZOLE 500 MG/100ML IV SOLN
500.0000 mg | Freq: Once | INTRAVENOUS | Status: AC
  Administered 2022-03-02: 500 mg via INTRAVENOUS
  Filled 2022-03-02: qty 100

## 2022-03-02 MED ORDER — SODIUM CHLORIDE 0.9 % IV SOLN
2.0000 g | Freq: Once | INTRAVENOUS | Status: AC
  Administered 2022-03-02: 2 g via INTRAVENOUS
  Filled 2022-03-02: qty 20

## 2022-03-02 MED ORDER — LACTATED RINGERS IV BOLUS (SEPSIS)
1000.0000 mL | Freq: Once | INTRAVENOUS | Status: AC
  Administered 2022-03-02: 1000 mL via INTRAVENOUS

## 2022-03-02 MED ORDER — ACETAMINOPHEN 325 MG PO TABS
650.0000 mg | ORAL_TABLET | Freq: Once | ORAL | Status: AC | PRN
  Administered 2022-03-02: 650 mg via ORAL
  Filled 2022-03-02: qty 2

## 2022-03-02 MED ORDER — IOHEXOL 300 MG/ML  SOLN
100.0000 mL | Freq: Once | INTRAMUSCULAR | Status: AC | PRN
Start: 2022-03-02 — End: 2022-03-02
  Administered 2022-03-02: 100 mL via INTRAVENOUS

## 2022-03-02 MED ORDER — LACTATED RINGERS IV SOLN: INTRAVENOUS | Status: DC

## 2022-03-02 NOTE — Discharge Instructions (Signed)
I am concerned since you have a fever, high heart rate, severe abdominal pain that we need to do more testing than what we have available in urgent care.  Please go immediately to the emergency room for further evaluation and management.

## 2022-03-02 NOTE — ED Notes (Signed)
Patient is being discharged from the Urgent Care and sent to the Emergency Department via POV . Per Verna Czech PA , patient is in need of higher level of care due to abdominal pain, fever, and vomiting . Patient is aware and verbalizes understanding of plan of care.  Vitals:   03/02/22 1742  BP: 102/68  Pulse: (!) 114  Resp: 18  Temp: (!) 100.5 F (38.1 C)  SpO2: 94%

## 2022-03-02 NOTE — ED Triage Notes (Signed)
Patient sent from urgent care. Has had lower right abdominal pain for 5 days. Said she is constipated. Began vomiting after drinking tea. Total of 6x of vomiting. Still has her appendix.

## 2022-03-02 NOTE — ED Provider Triage Note (Signed)
Emergency Medicine Provider Triage Evaluation Note  Christina [name] , a 76 y.o. female  was evaluated in triage.  Pt complains of abd pain. Lower abd pain ongoing for several days, having constipation and now fever and nausea.  Seen at [facility] and sent here for further eval. No cp, sob, cough, dysuria  Review of Systems  Positive: As above Negative: As above  Physical Exam  BP 130/71 (BP Location: Left Arm)   Pulse (!) 118   Temp (!) 101.5 F (38.6 C) (Oral)   Resp 16   Ht '5\' 6"'$  (1.676 m)   Wt 83.9 kg   SpO2 98%   BMI 29.86 kg/m  Gen:   Awake, no distress   Resp:  Normal effort  MSK:   Moves extremities without difficulty  Other:    Medical Decision Making  Medically screening exam initiated at 8:40 PM.  Appropriate orders placed.  Christina [name] was informed that the remainder of the evaluation will be completed by another provider, this initial triage assessment does not replace that evaluation, and the importance of remaining in the ED until their evaluation is complete.  Code sepsis initiated.  Intraabdominal source likely   Domenic Moras, Hershal Coria 03/02/22 2045

## 2022-03-02 NOTE — Sepsis Progress Note (Signed)
Monitoring for the code sepsis protocol. °

## 2022-03-02 NOTE — ED Provider Notes (Signed)
[facility]    CSN: [id number] Arrival date & time: 03/02/22  1653      History   Chief Complaint Chief Complaint  Patient presents with   Abdominal Pain   Emesis   Constipation    HPI Christina [name] is a 76 y.o. female.   Patient presents today with a 5-day history of lower abdominal pain that is worsened prompting evaluation.  She reports pain is rated 10 on a 0-10 pain scale, localized to lower abdomen, described as sharp, no relieving factors identified.  She has had some constipation but did have a bowel movement while in our clinic which she reports was normal.  She reports nausea and vomiting is a decreased oral intake.  She reports little solid food intake in the past 24 hours but has been able to drink some tea without recurrent emesis.  Denies any known sick contacts.  Denies any recent medication changes, antibiotic use, recent travel, suspicious food intake.  Denies history of gastrointestinal disorder including diverticulitis, Crohn's disease, ulcerative colitis.  Denies previous abdominal surgery and still has gallbladder and appendix.    Past Medical History:  Diagnosis Date   Anemia    Breast cancer ([place]) 08/28/2015   Bilateral   Breast cancer of upper-outer quadrant of right female breast ([place]) 06/23/2015   High cholesterol    Hx of radiation therapy 11/05/15- 12/16/15   Right Breast and Left Breast   Hypertension    Personal history of radiation therapy    Right Breast Cancer   Vitamin D deficiency 06/29/2018    Patient Active Problem List   Diagnosis Date Noted   History of COVID-19 09/22/2020   Vitamin D deficiency 06/29/2018   Malignant neoplasm of lower-inner quadrant of left breast in female, estrogen receptor positive ([place]) 10/20/2015   Malignant neoplasm of upper-outer quadrant of right breast in female, estrogen receptor positive ([place]) 06/23/2015   CONSTIPATION 04/01/2010   PALPITATIONS 04/01/2010   URINALYSIS, ABNORMAL 04/01/2010    ANEMIA 05/22/2009   DENTAL CARIES 04/21/2009   Elevated LDL cholesterol level 03/18/2008   Essential hypertension 08/23/2006    Past Surgical History:  Procedure Laterality Date   BREAST LUMPECTOMY Left 08/28/2015   BREAST LUMPECTOMY Right 08/28/2015   BREAST LUMPECTOMY WITH NEEDLE LOCALIZATION AND AXILLARY SENTINEL LYMPH NODE BX Bilateral 08/28/2015   Procedure: BILATERAL BREAST LUMPECTOMY WITH BILATERAL  NEEDLE LOCALIZATION AND BILATERAL AXILLARY SENTINEL LYMPH NODE BX;  Surgeon: Erroll Luna, MD;  Location: [facility];  Service: General;  Laterality: Bilateral;    OB History   No obstetric history on file.      Home Medications    Prior to Admission medications   Medication Sig Start Date End Date Taking? Authorizing Provider  amLODipine (NORVASC) 10 MG tablet Take 1 tablet by mouth daily 07/28/21     amLODipine (NORVASC) 10 MG tablet Take 1 tablet by mouth daily (dose change). 07/28/21   Park Meo T, PA-C  amLODipine (NORVASC) 5 MG tablet Take 1 tablet (5 mg total) by mouth daily. 06/03/21     celecoxib (CELEBREX) 200 MG capsule Take 1 capsule (200 mg total) by mouth 2 times daily as needed for pain. 06/19/21     cholecalciferol (VITAMIN D3) 25 MCG (1000 UNIT) tablet Take 1 tablet (1,000 Units total) by mouth daily. 01/12/21   Magrinat, Virgie Dad, MD  fexofenadine (ALLEGRA) 60 MG tablet Take 1 tablet by mouth 2 times daily. 12/02/21   Regino Fournet, Derry Skill, PA-C  olmesartan (BENICAR) 20  MG tablet Take 1 tablet (20 mg total) by mouth daily. 08/21/21     olopatadine (PATADAY) 0.1 % ophthalmic solution Place 1 drop into both eyes 2 times daily. 12/02/21   Tangee Marszalek, Derry Skill, PA-C  predniSONE (DELTASONE) 10 MG tablet Take 6 tablets on day 1 then decrease by 1 tablet each day until finished (6-5-4-3-2-1) 03/12/21     simvastatin (ZOCOR) 10 MG tablet TAKE 1 TABLET BY MOUTH AT BEDTIME 10/20/20 10/20/21  Park Meo T, PA-C  losartan-hydrochlorothiazide (HYZAAR) 100-25 MG tablet Take 1 tablet by  mouth daily. 12/31/20 06/03/21      Family History Family History  Problem Relation Age of Onset   Cancer Mother    Diabetes Sister    Lung cancer Brother    Diabetes Brother     Social History Social History   Tobacco Use   Smoking status: Never   Smokeless tobacco: Never  Substance Use Topics   Alcohol use: No   Drug use: No     Allergies   Onion, Aspirin, and Hydrocodone   Review of Systems Review of Systems  Constitutional:  Positive for activity change, appetite change, fatigue and fever.  Respiratory:  Negative for cough and shortness of breath.   Cardiovascular:  Negative for chest pain.  Gastrointestinal:  Positive for abdominal pain, constipation, nausea and vomiting. Negative for diarrhea.  Neurological:  Negative for dizziness, light-headedness and headaches.     Physical Exam Triage Vital Signs ED Triage Vitals  Enc Vitals Group     BP 03/02/22 1742 102/68     Pulse Rate 03/02/22 1742 (!) 114     Resp 03/02/22 1742 18     Temp 03/02/22 1742 (!) 100.5 F (38.1 C)     Temp src --      SpO2 03/02/22 1742 94 %     Weight --      Height --      Head Circumference --      Peak Flow --      Pain Score 03/02/22 1739 10     Pain Loc --      Pain Edu? --      Excl. in [place]? --    No data found.  Updated Vital Signs BP 102/68   Pulse (!) 114   Temp (!) 100.5 F (38.1 C)   Resp 18   SpO2 94%   Visual Acuity Right Eye Distance:   Left Eye Distance:   Bilateral Distance:    Right Eye Near:   Left Eye Near:    Bilateral Near:     Physical Exam Vitals reviewed.  Constitutional:      General: She is awake. She is not in acute distress.    Appearance: Normal appearance. She is well-developed. She is not ill-appearing.     Comments: Very pleasant female appears stated age in no acute distress sitting in wheelchair in exam room holding emesis bag  HENT:     Head: Normocephalic and atraumatic.     Mouth/Throat:     Mouth: Mucous membranes are  moist.  Cardiovascular:     Rate and Rhythm: Regular rhythm. Tachycardia present.     Heart sounds: Normal heart sounds, S1 normal and S2 normal. No murmur heard. Pulmonary:     Effort: Pulmonary effort is normal.     Breath sounds: Normal breath sounds. No wheezing, rhonchi or rales.     Comments: Clear to auscultation bilaterally Abdominal:     General: Bowel sounds are  normal.     Palpations: Abdomen is soft.     Tenderness: There is abdominal tenderness in the right lower quadrant, suprapubic area and left lower quadrant. There is no right CVA tenderness, left CVA tenderness, guarding or rebound.     Comments: Tenderness palpation throughout lower abdomen.  Exam limited as patient is unable to get onto exam table.  Psychiatric:        Behavior: Behavior is cooperative.      UC Treatments / Results  Labs (all labs ordered are listed, but only abnormal results are displayed) Labs Reviewed  POCT URINALYSIS DIPSTICK, ED / UC    EKG   Radiology No results found.  Procedures Procedures (including critical care time)  Medications Ordered in UC Medications - No data to display  Initial Impression / Assessment and Plan / UC Course  I have reviewed the triage vital signs and the nursing notes.  Pertinent labs & imaging results that were available during my care of the patient were reviewed by me and considered in my medical decision making (see chart for details).     Patient is febrile and tachycardic in clinic today.  She meets SIRS criteria.  Given 10 out of 10 abdominal pain with significant tenderness on exam discussed that she needs further evaluation as she likely needs imaging which we do not have available in urgent care.  Recommended that she go to the emergency room for further evaluation and management.  Patient was agreeable and will go directly to [facility], ER.  She is accompanied by family members who will take her by private vehicle.  Her vital signs are  stable at the time of discharge.  Final Clinical Impressions(s) / UC Diagnoses   Final diagnoses:  Lower abdominal pain  Decreased oral intake  SIRS (systemic inflammatory response syndrome) ([place])     Discharge Instructions      I am concerned since you have a fever, high heart rate, severe abdominal pain that we need to do more testing than what we have available in urgent care.  Please go immediately to the emergency room for further evaluation and management.     ED Prescriptions   None    PDMP not reviewed this encounter.   Terrilee Croak, PA-C 03/02/22 1829

## 2022-03-02 NOTE — ED Triage Notes (Signed)
Pt is present today with lower abdominal pain, vomiting, and constipation. Pt sx started today

## 2022-03-03 ENCOUNTER — Inpatient Hospital Stay (HOSPITAL_COMMUNITY): Payer: Medicare Other | Admitting: Anesthesiology

## 2022-03-03 ENCOUNTER — Encounter (HOSPITAL_COMMUNITY): Payer: Self-pay | Admitting: Family Medicine

## 2022-03-03 ENCOUNTER — Other Ambulatory Visit: Payer: Self-pay

## 2022-03-03 ENCOUNTER — Encounter (HOSPITAL_COMMUNITY)
Admission: EM | Disposition: A | Discharge status: Home / Self Care | Payer: Self-pay | Source: Ambulatory Visit | Attending: Internal Medicine

## 2022-03-03 DIAGNOSIS — A419 Sepsis, unspecified organism: Secondary | ICD-10-CM

## 2022-03-03 DIAGNOSIS — Z923 Personal history of irradiation: Secondary | ICD-10-CM | POA: Diagnosis not present

## 2022-03-03 DIAGNOSIS — K529 Noninfective gastroenteritis and colitis, unspecified: Secondary | ICD-10-CM | POA: Diagnosis present

## 2022-03-03 DIAGNOSIS — R9431 Abnormal electrocardiogram [ECG] [EKG]: Secondary | ICD-10-CM | POA: Diagnosis present

## 2022-03-03 DIAGNOSIS — K5900 Constipation, unspecified: Secondary | ICD-10-CM | POA: Diagnosis present

## 2022-03-03 DIAGNOSIS — K3532 Acute appendicitis with perforation and localized peritonitis, without abscess: Secondary | ICD-10-CM | POA: Diagnosis present

## 2022-03-03 DIAGNOSIS — K3580 Unspecified acute appendicitis: Secondary | ICD-10-CM | POA: Diagnosis present

## 2022-03-03 DIAGNOSIS — C181 Malignant neoplasm of appendix: Secondary | ICD-10-CM | POA: Diagnosis present

## 2022-03-03 DIAGNOSIS — N179 Acute kidney failure, unspecified: Secondary | ICD-10-CM | POA: Diagnosis present

## 2022-03-03 DIAGNOSIS — Z885 Allergy status to narcotic agent status: Secondary | ICD-10-CM | POA: Diagnosis not present

## 2022-03-03 DIAGNOSIS — Z888 Allergy status to other drugs, medicaments and biological substances status: Secondary | ICD-10-CM | POA: Diagnosis not present

## 2022-03-03 DIAGNOSIS — Z91018 Allergy to other foods: Secondary | ICD-10-CM | POA: Diagnosis not present

## 2022-03-03 DIAGNOSIS — K429 Umbilical hernia without obstruction or gangrene: Secondary | ICD-10-CM | POA: Diagnosis present

## 2022-03-03 DIAGNOSIS — Z20822 Contact with and (suspected) exposure to covid-19: Secondary | ICD-10-CM | POA: Diagnosis present

## 2022-03-03 DIAGNOSIS — I10 Essential (primary) hypertension: Secondary | ICD-10-CM | POA: Diagnosis present

## 2022-03-03 DIAGNOSIS — R6520 Severe sepsis without septic shock: Secondary | ICD-10-CM

## 2022-03-03 DIAGNOSIS — Z23 Encounter for immunization: Secondary | ICD-10-CM | POA: Diagnosis present

## 2022-03-03 DIAGNOSIS — Z853 Personal history of malignant neoplasm of breast: Secondary | ICD-10-CM | POA: Diagnosis not present

## 2022-03-03 DIAGNOSIS — E7800 Pure hypercholesterolemia, unspecified: Secondary | ICD-10-CM | POA: Diagnosis present

## 2022-03-03 DIAGNOSIS — Z79899 Other long term (current) drug therapy: Secondary | ICD-10-CM | POA: Diagnosis not present

## 2022-03-03 DIAGNOSIS — K353 Acute appendicitis with localized peritonitis, without perforation or gangrene: Secondary | ICD-10-CM

## 2022-03-03 HISTORY — PX: LAPAROSCOPIC APPENDECTOMY: SHX408

## 2022-03-03 LAB — LACTIC ACID, PLASMA
Lactic Acid, Venous: 1.9 mmol/L (ref 0.5–1.9)
Lactic Acid, Venous: 2.3 mmol/L (ref 0.5–1.9)

## 2022-03-03 LAB — BASIC METABOLIC PANEL
Anion gap: 8 (ref 5–15)
BUN: 16 mg/dL (ref 8–23)
CO2: 26 mmol/L (ref 22–32)
Calcium: 8.6 mg/dL — ABNORMAL LOW (ref 8.9–10.3)
Chloride: 107 mmol/L (ref 98–111)
Creatinine, Ser: 0.9 mg/dL (ref 0.44–1.00)
GFR, Estimated: >60 mL/min (ref >60)
Glucose, Bld: 102 mg/dL — ABNORMAL HIGH (ref 70–99)
Potassium: 4 mmol/L (ref 3.5–5.1)
Sodium: 141 mmol/L (ref 135–145)

## 2022-03-03 LAB — URINE CULTURE

## 2022-03-03 LAB — CBC
HCT: 31.4 % — ABNORMAL LOW (ref 36.0–46.0)
Hemoglobin: 10.1 g/dL — ABNORMAL LOW (ref 12.0–15.0)
MCH: 26.9 pg (ref 26.0–34.0)
MCHC: 32.2 g/dL (ref 30.0–36.0)
MCV: 83.7 fL (ref 80.0–100.0)
Platelets: 211 10*3/uL (ref 150–400)
RBC: 3.75 MIL/uL — ABNORMAL LOW (ref 3.87–5.11)
RDW: 12.9 % (ref 11.5–15.5)
WBC: 15.6 10*3/uL — ABNORMAL HIGH (ref 4.0–10.5)
nRBC: 0 % (ref 0.0–0.2)

## 2022-03-03 LAB — MAGNESIUM: Magnesium: 1.9 mg/dL (ref 1.7–2.4)

## 2022-03-03 SURGERY — APPENDECTOMY, LAPAROSCOPIC
Anesthesia: General

## 2022-03-03 MED ORDER — PHENYLEPHRINE 80 MCG/ML (10ML) SYRINGE FOR IV PUSH (FOR BLOOD PRESSURE SUPPORT)
PREFILLED_SYRINGE | INTRAVENOUS | Status: AC
  Filled 2022-03-03: qty 20

## 2022-03-03 MED ORDER — DEXAMETHASONE SODIUM PHOSPHATE 10 MG/ML IJ SOLN
INTRAMUSCULAR | Status: DC | PRN
  Administered 2022-03-03: 4 mg via INTRAVENOUS

## 2022-03-03 MED ORDER — METHOCARBAMOL 500 MG PO TABS: 500.0000 mg | ORAL_TABLET | Freq: Four times a day (QID) | ORAL | Status: DC | PRN

## 2022-03-03 MED ORDER — OXYCODONE HCL 5 MG PO TABS
5.0000 mg | ORAL_TABLET | ORAL | Status: DC | PRN
  Administered 2022-03-03 – 2022-03-06 (×5): 5 mg via ORAL
  Filled 2022-03-03 (×5): qty 1

## 2022-03-03 MED ORDER — METRONIDAZOLE 500 MG/100ML IV SOLN: 500.0000 mg | Freq: Two times a day (BID) | INTRAVENOUS | Status: DC

## 2022-03-03 MED ORDER — SODIUM CHLORIDE 0.9 % IV SOLN: 2.0000 g | INTRAVENOUS | Status: DC

## 2022-03-03 MED ORDER — LIDOCAINE HCL (PF) 2 % IJ SOLN
INTRAMUSCULAR | Status: AC
  Filled 2022-03-03: qty 5

## 2022-03-03 MED ORDER — MIDAZOLAM HCL 5 MG/5ML IJ SOLN
INTRAMUSCULAR | Status: DC | PRN
  Administered 2022-03-03: 1 mg via INTRAVENOUS

## 2022-03-03 MED ORDER — ONDANSETRON HCL 4 MG/2ML IJ SOLN
INTRAMUSCULAR | Status: AC
  Filled 2022-03-03: qty 4

## 2022-03-03 MED ORDER — ACETAMINOPHEN 500 MG PO TABS
ORAL_TABLET | ORAL | Status: AC
  Filled 2022-03-03: qty 2

## 2022-03-03 MED ORDER — FENTANYL CITRATE (PF) 100 MCG/2ML IJ SOLN
INTRAMUSCULAR | Status: DC | PRN
Start: 2022-03-03 — End: 2022-03-03
  Administered 2022-03-03: 100 ug via INTRAVENOUS

## 2022-03-03 MED ORDER — FENTANYL CITRATE (PF) 100 MCG/2ML IJ SOLN
INTRAMUSCULAR | Status: AC
  Filled 2022-03-03: qty 2

## 2022-03-03 MED ORDER — FENTANYL CITRATE PF 50 MCG/ML IJ SOSY
PREFILLED_SYRINGE | INTRAMUSCULAR | Status: AC
  Filled 2022-03-03: qty 3

## 2022-03-03 MED ORDER — LACTATED RINGERS IV SOLN
INTRAVENOUS | Status: AC
  Administered 2022-03-03: 1000 mL via INTRAVENOUS

## 2022-03-03 MED ORDER — SUGAMMADEX SODIUM 200 MG/2ML IV SOLN
INTRAVENOUS | Status: DC | PRN
  Administered 2022-03-03: 195 mg via INTRAVENOUS

## 2022-03-03 MED ORDER — PNEUMOCOCCAL 20-VAL CONJ VACC 0.5 ML IM SUSY
0.5000 mL | PREFILLED_SYRINGE | INTRAMUSCULAR | Status: AC
  Administered 2022-03-04: 0.5 mL via INTRAMUSCULAR
  Filled 2022-03-03: qty 0.5

## 2022-03-03 MED ORDER — MIDAZOLAM HCL 2 MG/2ML IJ SOLN
INTRAMUSCULAR | Status: AC
  Filled 2022-03-03: qty 2

## 2022-03-03 MED ORDER — ACETAMINOPHEN 650 MG RE SUPP
650.0000 mg | Freq: Four times a day (QID) | RECTAL | Status: DC | PRN
Start: 2022-03-03 — End: 2022-03-04

## 2022-03-03 MED ORDER — LABETALOL HCL 5 MG/ML IV SOLN
10.0000 mg | INTRAVENOUS | Status: DC | PRN
Start: 2022-03-03 — End: 2022-03-06

## 2022-03-03 MED ORDER — ROCURONIUM BROMIDE 10 MG/ML (PF) SYRINGE
PREFILLED_SYRINGE | INTRAVENOUS | Status: AC
  Filled 2022-03-03: qty 10

## 2022-03-03 MED ORDER — FENTANYL CITRATE PF 50 MCG/ML IJ SOSY
25.0000 ug | PREFILLED_SYRINGE | INTRAMUSCULAR | Status: DC | PRN
  Administered 2022-03-03: 25 ug via INTRAVENOUS
  Filled 2022-03-03: qty 1

## 2022-03-03 MED ORDER — DEXAMETHASONE SODIUM PHOSPHATE 10 MG/ML IJ SOLN
INTRAMUSCULAR | Status: AC
  Filled 2022-03-03: qty 2

## 2022-03-03 MED ORDER — ACETAMINOPHEN 500 MG PO TABS
1000.0000 mg | ORAL_TABLET | Freq: Once | ORAL | Status: AC
  Administered 2022-03-03: 1000 mg via ORAL

## 2022-03-03 MED ORDER — PHENYLEPHRINE 80 MCG/ML (10ML) SYRINGE FOR IV PUSH (FOR BLOOD PRESSURE SUPPORT)
PREFILLED_SYRINGE | INTRAVENOUS | Status: DC | PRN
  Administered 2022-03-03: 40 ug via INTRAVENOUS
  Administered 2022-03-03: 160 ug via INTRAVENOUS
  Administered 2022-03-03: 80 ug via INTRAVENOUS

## 2022-03-03 MED ORDER — PIPERACILLIN-TAZOBACTAM 3.375 G IVPB
3.3750 g | Freq: Three times a day (TID) | INTRAVENOUS | Status: DC
Start: 2022-03-04 — End: 2022-03-06
  Administered 2022-03-04 – 2022-03-06 (×8): 3.375 g via INTRAVENOUS
  Filled 2022-03-03 (×9): qty 50

## 2022-03-03 MED ORDER — PROPOFOL 10 MG/ML IV BOLUS
INTRAVENOUS | Status: DC | PRN
  Administered 2022-03-03: 130 mg via INTRAVENOUS

## 2022-03-03 MED ORDER — BUPIVACAINE-EPINEPHRINE (PF) 0.25% -1:200000 IJ SOLN
INTRAMUSCULAR | Status: AC
Start: 2022-03-03 — End: ?
  Filled 2022-03-03: qty 30

## 2022-03-03 MED ORDER — FENTANYL CITRATE PF 50 MCG/ML IJ SOSY
25.0000 ug | PREFILLED_SYRINGE | INTRAMUSCULAR | Status: DC | PRN
  Administered 2022-03-03 (×2): 50 ug via INTRAVENOUS

## 2022-03-03 MED ORDER — DEXMEDETOMIDINE (PRECEDEX) IN NS 20 MCG/5ML (4 MCG/ML) IV SYRINGE
PREFILLED_SYRINGE | INTRAVENOUS | Status: DC | PRN
  Administered 2022-03-03 (×4): 4 ug via INTRAVENOUS

## 2022-03-03 MED ORDER — PROPOFOL 10 MG/ML IV BOLUS
INTRAVENOUS | Status: AC
  Filled 2022-03-03: qty 20

## 2022-03-03 MED ORDER — DEXMEDETOMIDINE HCL IN NACL 80 MCG/20ML IV SOLN
INTRAVENOUS | Status: AC
  Filled 2022-03-03: qty 20

## 2022-03-03 MED ORDER — CHLORHEXIDINE GLUCONATE 0.12 % MT SOLN
15.0000 mL | Freq: Once | OROMUCOSAL | Status: AC
Start: 2022-03-03 — End: 2022-03-03
  Administered 2022-03-03: 15 mL via OROMUCOSAL

## 2022-03-03 MED ORDER — BUPIVACAINE-EPINEPHRINE 0.25% -1:200000 IJ SOLN
INTRAMUSCULAR | Status: DC | PRN
  Administered 2022-03-03: 10 mL

## 2022-03-03 MED ORDER — ROCURONIUM BROMIDE 10 MG/ML (PF) SYRINGE
PREFILLED_SYRINGE | INTRAVENOUS | Status: DC | PRN
  Administered 2022-03-03: 60 mg via INTRAVENOUS
  Administered 2022-03-03: 10 mg via INTRAVENOUS

## 2022-03-03 MED ORDER — SODIUM CHLORIDE 0.9 % IR SOLN
Status: DC | PRN
  Administered 2022-03-03: 3000 mL

## 2022-03-03 MED ORDER — LIDOCAINE 2% (20 MG/ML) 5 ML SYRINGE
INTRAMUSCULAR | Status: DC | PRN
  Administered 2022-03-03: 80 mg via INTRAVENOUS

## 2022-03-03 MED ORDER — MORPHINE SULFATE (PF) 2 MG/ML IV SOLN: 2.0000 mg | INTRAVENOUS | Status: DC | PRN

## 2022-03-03 MED ORDER — ONDANSETRON HCL 4 MG/2ML IJ SOLN
INTRAMUSCULAR | Status: DC | PRN
  Administered 2022-03-03: 4 mg via INTRAVENOUS

## 2022-03-03 MED ORDER — SODIUM CHLORIDE 0.9% FLUSH
3.0000 mL | Freq: Two times a day (BID) | INTRAVENOUS | Status: DC
  Administered 2022-03-04 – 2022-03-06 (×6): 3 mL via INTRAVENOUS

## 2022-03-03 MED ORDER — ACETAMINOPHEN 325 MG PO TABS
650.0000 mg | ORAL_TABLET | Freq: Four times a day (QID) | ORAL | Status: DC | PRN
Start: 2022-03-03 — End: 2022-03-04

## 2022-03-03 MED ORDER — ROCURONIUM BROMIDE 10 MG/ML (PF) SYRINGE
PREFILLED_SYRINGE | INTRAVENOUS | Status: AC
  Filled 2022-03-03: qty 20

## 2022-03-03 MED ORDER — 0.9 % SODIUM CHLORIDE (POUR BTL) OPTIME
TOPICAL | Status: DC | PRN
  Administered 2022-03-03: 1000 mL

## 2022-03-03 MED ORDER — PIPERACILLIN-TAZOBACTAM 3.375 G IVPB
3.3750 g | Freq: Three times a day (TID) | INTRAVENOUS | Status: DC
  Administered 2022-03-03: 3.375 g via INTRAVENOUS
  Filled 2022-03-03 (×2): qty 50

## 2022-03-03 MED ORDER — LIDOCAINE HCL (PF) 2 % IJ SOLN
INTRAMUSCULAR | Status: AC
  Filled 2022-03-03: qty 10

## 2022-03-03 MED ORDER — BUPIVACAINE-EPINEPHRINE 0.5% -1:200000 IJ SOLN
INTRAMUSCULAR | Status: AC
  Filled 2022-03-03: qty 1

## 2022-03-03 MED ORDER — FENTANYL CITRATE PF 50 MCG/ML IJ SOSY: 50.0000 ug | PREFILLED_SYRINGE | INTRAMUSCULAR | Status: DC | PRN

## 2022-03-03 SURGICAL SUPPLY — 43 items
ADH SKN CLS APL DERMABOND .7 (GAUZE/BANDAGES/DRESSINGS) ×1
APPLIER CLIP ROT 10 11.4 M/L (STAPLE)
APR CLP MED LRG 11.4X10 (STAPLE)
BAG COUNTER SPONGE SURGICOUNT (BAG) ×1 IMPLANT
BAG SPNG CNTER NS LX DISP (BAG) ×1
CLIP APPLIE ROT 10 11.4 M/L (STAPLE) IMPLANT
CUTTER FLEX LINEAR 45M (STAPLE) ×1 IMPLANT
DERMABOND ADVANCED (GAUZE/BANDAGES/DRESSINGS) ×1
DERMABOND ADVANCED .7 DNX12 (GAUZE/BANDAGES/DRESSINGS) ×2 IMPLANT
DRAPE LAPAROSCOPIC ABDOMINAL (DRAPES) ×3 IMPLANT
DRAPE WARM FLUID 44X44 (DRAPES) ×3 IMPLANT
ELECT REM PT RETURN 15FT ADLT (MISCELLANEOUS) ×3 IMPLANT
ENDOLOOP SUT PDS II  0 18 (SUTURE)
ENDOLOOP SUT PDS II 0 18 (SUTURE) IMPLANT
GLOVE BIOGEL PI IND STRL 7.0 (GLOVE) ×2 IMPLANT
GLOVE BIOGEL PI INDICATOR 7.0 (GLOVE) ×1
GLOVE INDICATOR 8.0 STRL GRN (GLOVE) ×6 IMPLANT
GLOVE SS BIOGEL STRL SZ 8 (GLOVE) ×2 IMPLANT
GLOVE SUPERSENSE BIOGEL SZ 8 (GLOVE) ×1
GOWN STRL REUS W/ TWL XL LVL3 (GOWN DISPOSABLE) ×4 IMPLANT
GOWN STRL REUS W/TWL XL LVL3 (GOWN DISPOSABLE) ×4
IRRIG SUCT STRYKERFLOW 2 WTIP (MISCELLANEOUS) ×2
IRRIGATION SUCT STRKRFLW 2 WTP (MISCELLANEOUS) ×2 IMPLANT
KIT BASIN OR (CUSTOM PROCEDURE TRAY) ×3 IMPLANT
KIT TURNOVER KIT A (KITS) ×1 IMPLANT
PENCIL SMOKE EVACUATOR (MISCELLANEOUS) ×1 IMPLANT
RELOAD 45 VASCULAR/THIN (ENDOMECHANICALS) IMPLANT
RELOAD STAPLE 45 2.5 WHT GRN (ENDOMECHANICALS) IMPLANT
RELOAD STAPLE 45 3.5 BLU ETS (ENDOMECHANICALS) IMPLANT
RELOAD STAPLE TA45 3.5 REG BLU (ENDOMECHANICALS) ×2 IMPLANT
SET TUBE SMOKE EVAC HIGH FLOW (TUBING) ×3 IMPLANT
SHEARS HARMONIC ACE PLUS 36CM (ENDOMECHANICALS) ×1 IMPLANT
SLEEVE Z-THREAD 5X100MM (TROCAR) ×3 IMPLANT
SPIKE FLUID TRANSFER (MISCELLANEOUS) ×3 IMPLANT
SUT MNCRL AB 4-0 PS2 18 (SUTURE) ×3 IMPLANT
SYS BAG RETRIEVAL 10MM (BASKET) ×2
SYSTEM BAG RETRIEVAL 10MM (BASKET) ×2 IMPLANT
TOWEL OR 17X26 10 PK STRL BLUE (TOWEL DISPOSABLE) ×3 IMPLANT
TRAY FOLEY MTR SLVR 16FR STAT (SET/KITS/TRAYS/PACK) ×3 IMPLANT
TRAY LAPAROSCOPIC (CUSTOM PROCEDURE TRAY) ×3 IMPLANT
TROCAR ADV FIXATION 12X100MM (TROCAR) ×3 IMPLANT
TROCAR BALLN 12MMX100 BLUNT (TROCAR) ×3 IMPLANT
TROCAR Z-THREAD OPTICAL 5X100M (TROCAR) ×3 IMPLANT

## 2022-03-03 NOTE — ED Provider Notes (Signed)
[facility] DEPT Provider Note   CSN: [id number] Arrival date & time: 03/02/22  2010     History  Chief Complaint  Patient presents with   Abdominal Pain    Christina [name] is a 76 y.o. female.  HPI     76 year old female comes in with chief complaint of abdominal pain.  Patient indicates that she has been having abdominal pain off and on for the last 5 days.  Over the last 2 or 3 days, the pain is constant and getting worse.  The pain is worse with any can of movement.  She has had some nausea and vomiting.  She also is having subjective fevers.  Patient denies any UTI-like symptoms.  Denies any history of abdominal surgery.  Home Medications Prior to Admission medications   Medication Sig Start Date End Date Taking? Authorizing Provider  amLODipine (NORVASC) 10 MG tablet Take 1 tablet by mouth daily 07/28/21     amLODipine (NORVASC) 10 MG tablet Take 1 tablet by mouth daily (dose change). 07/28/21   Park Meo T, PA-C  amLODipine (NORVASC) 5 MG tablet Take 1 tablet (5 mg total) by mouth daily. 06/03/21     celecoxib (CELEBREX) 200 MG capsule Take 1 capsule (200 mg total) by mouth 2 times daily as needed for pain. 06/19/21     cholecalciferol (VITAMIN D3) 25 MCG (1000 UNIT) tablet Take 1 tablet (1,000 Units total) by mouth daily. 01/12/21   Magrinat, Virgie Dad, MD  fexofenadine (ALLEGRA) 60 MG tablet Take 1 tablet by mouth 2 times daily. 12/02/21   Raspet, Derry Skill, PA-C  olmesartan (BENICAR) 20 MG tablet Take 1 tablet (20 mg total) by mouth daily. 08/21/21     olopatadine (PATADAY) 0.1 % ophthalmic solution Place 1 drop into both eyes 2 times daily. 12/02/21   Raspet, Derry Skill, PA-C  predniSONE (DELTASONE) 10 MG tablet Take 6 tablets on day 1 then decrease by 1 tablet each day until finished (6-5-4-3-2-1) 03/12/21     simvastatin (ZOCOR) 10 MG tablet TAKE 1 TABLET BY MOUTH AT BEDTIME 10/20/20 10/20/21  Park Meo T, PA-C   losartan-hydrochlorothiazide (HYZAAR) 100-25 MG tablet Take 1 tablet by mouth daily. 12/31/20 06/03/21        Allergies    Onion, Aspirin, and Hydrocodone    Review of Systems   Review of Systems  All other systems reviewed and are negative.   Physical Exam Updated Vital Signs BP (!) 147/70   Pulse 92   Temp (!) 101.5 F (38.6 C) (Oral)   Resp 20   Ht '5\' 6"'$  (1.676 m)   Wt 83.9 kg   SpO2 93%   BMI 29.86 kg/m  Physical Exam Vitals and nursing note reviewed.  Constitutional:      Appearance: She is well-developed.  HENT:     Head: Atraumatic.  Cardiovascular:     Rate and Rhythm: Normal rate.  Pulmonary:     Effort: Pulmonary effort is normal.  Abdominal:     Tenderness: There is abdominal tenderness. There is guarding. Positive signs include McBurney's sign.  Musculoskeletal:     Cervical back: Normal range of motion and neck supple.  Skin:    General: Skin is warm and dry.  Neurological:     Mental Status: She is alert and oriented to person, place, and time.     ED Results / Procedures / Treatments   Labs (all labs ordered are listed, but only abnormal results are displayed) Labs Reviewed  COMPREHENSIVE METABOLIC PANEL - Abnormal; Notable for the following components:      Result Value   Glucose, Bld 117 (*)    Creatinine, Ser 1.44 (*)    Total Bilirubin 1.4 (*)    GFR, Estimated 38 (*)    All other components within normal limits  CBC - Abnormal; Notable for the following components:   WBC 15.8 (*)    All other components within normal limits  URINALYSIS, ROUTINE W REFLEX MICROSCOPIC - Abnormal; Notable for the following components:   Specific Gravity, Urine 1.004 (*)    Hgb urine dipstick SMALL (*)    Bacteria, UA RARE (*)    All other components within normal limits  LACTIC ACID, PLASMA - Abnormal; Notable for the following components:   Lactic Acid, Venous 2.3 (*)    All other components within normal limits  RESP PANEL BY RT-PCR (FLU A&B, COVID)  ARPGX2  CULTURE, BLOOD (ROUTINE X 2)  CULTURE, BLOOD (ROUTINE X 2)  URINE CULTURE  LIPASE, BLOOD  LACTIC ACID, PLASMA  PROTIME-INR  APTT    EKG EKG Interpretation  Date/Time:  Tuesday March 02 2022 21:36:47 EDT Ventricular Rate:  107 PR Interval:  128 QRS Duration: 124 QT Interval:  384 QTC Calculation: 513 R Axis:   20 Text Interpretation: Sinus tachycardia Nonspecific intraventricular conduction delay Nonspecific T abnormalities, inferior leads ST elevation, consider inferior injury No acute changes Confirmed by Varney Biles [phone]) on 03/03/2022 12:22:58 AM  Radiology CT ABDOMEN PELVIS W CONTRAST  Result Date: 03/03/2022 CLINICAL DATA:  Left lower quadrant abdominal pain. EXAM: CT ABDOMEN AND PELVIS WITH CONTRAST TECHNIQUE: Multidetector CT imaging of the abdomen and pelvis was performed using the standard protocol following bolus administration of intravenous contrast. RADIATION DOSE REDUCTION: This exam was performed according to the departmental dose-optimization program which includes automated exposure control, adjustment of the mA and/or kV according to patient size and/or use of iterative reconstruction technique. CONTRAST:  170m OMNIPAQUE IOHEXOL 300 MG/ML  SOLN COMPARISON:  None Available. FINDINGS: Lower chest: No acute abnormality. Hepatobiliary: No focal liver abnormality is seen. No gallstones, gallbladder wall thickening, or biliary dilatation. Pancreas: Unremarkable. No pancreatic ductal dilatation or surrounding inflammatory changes. Spleen: Normal in size without focal abnormality. Adrenals/Urinary Tract: There is a 3.6 cm right renal cyst. There is a 1.8 cm left renal cyst. Otherwise, the adrenal glands, kidneys and bladder are within normal limits. Stomach/Bowel: The appendix is dilated and fluid-filled measuring 15 mm in diameter. There is surrounding inflammatory stranding. There is no evidence for perforation or abscess. There is also wall thickening and  inflammation involving the adjacent ileal loops in the pelvis and right lower quadrant. There is no pneumatosis or bowel obstruction. There is colonic diverticulosis. Stomach is within normal limits. Vascular/Lymphatic: No significant vascular findings are present. There is an enlarged right lower quadrant lymph node in the mesentery measuring 15 mm short axis. Reproductive: Status post hysterectomy. No adnexal masses. Other: Small amount of free fluid in the pelvis. Small fat containing umbilical hernia. Musculoskeletal: The bones are osteopenic. Degenerative changes affect the spine. IMPRESSION: 1. Acute appendicitis.  No evidence for perforation or abscess. 2. Adjacent ileitis, likely reactive. 3. Small amount of free fluid in the pelvis. 4. Enlarged right lower quadrant lymph node, likely reactive. Electronically Signed   By: ARonney AstersM.D.   On: 03/03/2022 00:00   DG Chest 2 View  Result Date: 03/02/2022 CLINICAL DATA:  Questionable sepsis. EXAM: CHEST - 2 VIEW COMPARISON:  Chest x-ray  02/10/2005 FINDINGS: The heart size and mediastinal contours are within normal limits. Both lungs are clear. The visualized skeletal structures are unremarkable. IMPRESSION: No active cardiopulmonary disease. Electronically Signed   By: Ronney Asters M.D.   On: 03/02/2022 21:19    Procedures .Critical Care  Performed by: Varney Biles, MD Authorized by: Varney Biles, MD   Critical care provider statement:    Critical care time (minutes):  35   Critical care was necessary to treat or prevent imminent or life-threatening deterioration of the following conditions:  Sepsis   Critical care was time spent personally by me on the following activities:  Development of treatment plan with patient or surrogate, discussions with consultants, evaluation of patient's response to treatment, examination of patient, ordering and review of laboratory studies, ordering and review of radiographic studies, ordering and  performing treatments and interventions, pulse oximetry, re-evaluation of patient's condition, review of old charts and obtaining history from patient or surrogate     Medications Ordered in ED Medications  lactated ringers infusion ( Intravenous New Bag/Given 03/02/22 2349)  fentaNYL (SUBLIMAZE) injection 50 mcg (has no administration in time range)  acetaminophen (TYLENOL) tablet 650 mg (650 mg Oral Given 03/02/22 2039)  lactated ringers bolus 1,000 mL (0 mLs Intravenous Stopped 03/02/22 2302)    And  lactated ringers bolus 1,000 mL (0 mLs Intravenous Stopped 03/02/22 2213)    And  lactated ringers bolus 1,000 mL (0 mLs Intravenous Stopped 03/02/22 2314)  cefTRIAXone (ROCEPHIN) 2 g in sodium chloride 0.9 % 100 mL IVPB (0 g Intravenous Stopped 03/02/22 2210)  metroNIDAZOLE (FLAGYL) IVPB 500 mg (0 mg Intravenous Stopped 03/02/22 2312)  iohexol (OMNIPAQUE) 300 MG/ML solution 100 mL (100 mLs Intravenous Contrast Given 03/02/22 2334)    ED Course/ Medical Decision Making/ A&P                           Medical Decision Making Amount and/or Complexity of Data Reviewed Labs: ordered.  Risk OTC drugs. Prescription drug management.  This patient presents to the ED with chief complaint(s) of right lower quadrant abdominal pain with pertinent past medical history of cancer history which further complicates the presenting complaint.   Patient noted to have right lower quadrant abdominal tenderness with guarding.  She is noted to be febrile, tachycardic.   The differential diagnosis includes acute appendicitis and related complications such as perforation, contained abscess, and even sepsis.  Given her cancer history, tumor also considered in the differential.  Clinically, does not appear to be UTI.    The initial plan is to get CT abdomen pelvis with contrast along with sepsis work-up.  Antibiotics already initiated because she is febrile, and we are quite convinced that she has intra-abdominal  process   Independent labs interpretation:  The following labs were independently interpreted: Elevated white count, elevated lactic acid   Treatment and Reassessment: CT scan confirms acute appendicitis.  No complication. Results of the ER work-up discussed with the patient.  IV pain medication ordered.   Consultation: - Consulted or discussed management/test interpretation w/ external professional: Dr. Georgette Dover, general surgery, they will see the patient in conjunction with hospitalist team.     Final Clinical Impression(s) / ED Diagnoses Final diagnoses:  Acute appendicitis with localized peritonitis, without perforation, abscess, or gangrene  Severe sepsis ([place])    Rx / [place] Orders ED Discharge Orders     None         Varney Biles, MD  03/03/22 0027  

## 2022-03-03 NOTE — Interval H&P Note (Signed)
History and Physical Interval Note:  03/03/2022 10:06 AM  Christina [name]  has presented today for surgery, with the diagnosis of APPENDICITIS.  The various methods of treatment have been discussed with the patient and family. After consideration of risks, benefits and other options for treatment, the patient has consented to  Procedure(s): APPENDECTOMY LAPAROSCOPIC (N/A) as a surgical intervention.  The patient's history has been reviewed, patient examined, no change in status, stable for surgery.  I have reviewed the patient's chart and labs.  Questions were answered to the patient's satisfaction.     [place]

## 2022-03-03 NOTE — Op Note (Signed)
Preoperative diagnosis: Acute appendicitis with peritonitis  Postoperative diagnosis: Acute appendicitis with perforation and peritonitis without abscess  Procedure: Laparoscopic appendectomy  Surgeon: Erroll Luna, MD  Anesthesia: General 0.25% Marcaine plain  EBL: Minimal  Specimen: Appendix sent in fragments to pathology  Drains: None  IV fluids: Per anesthesia record  Indications for procedure: The patient 76 year old female admitted with acute appendicitis for 3 days.  She has pain and peritonitis recommend surgical treatment given the fact that she has peritonitis and progression of pain.  Risks and benefits of surgery reviewed with the patient.The procedure has been discussed with the patient.  Alternative therapies have been discussed with the patient.  Operative risks include bleeding,  Infection,  Organ injury,  Nerve injury,  Blood vessel injury,  DVT,  Pulmonary embolism,  Death,  And possible reoperation.  Medical management risks include worsening of present situation.  The success of the procedure is 50 -90 % at treating patients symptoms.   The patient understands and agrees to proceed.  Medical management discussed as well.  Success rates of both approaches reviewed with the patient today.  She is opted for laparoscopic appendectomy. CASE DATA:  Type of patient?: LDOW CASE (Surgical Hospitalist [facility] Inpatient)  Status of Case? EMERGENT Add On  Infection Present At Time Of Surgery (PATOS)?  INFECTION of the appendix       Description of procedure: The patient was met in the holding area and questions were answered.  She was taken back to the operating room and placed upon upon the OR table.  After induction of general esthesia, the abdomen was prepped and draped in sterile fashion timeout performed.  She was already on antibiotics.  She had an umbilical hernia on exam.  A curvilinear incision was made above this.  Dissection was carried down the hernia defect was clearly  visualized circumferentially.  It measured a centimeter.  A pursestring suture of 0 Vicryl was placed around this and this was used for access into the abdominal cavity.  A 12 mm port was placed and secured with sutures.  Insufflation to 15 mmHg pressure CO2 was done.  Laparoscopy was performed.  She has significant peritonitis noted.  I placed two 5 mm ports 1 in the right upper quadrant and the other left lower quadrant.  The appendix was densely adherent to the pelvis.  I mobilized the cecum to help facilitate getting the appendix in the pelvis.  Once I did this with harmonic scalpel was able to better visualize the appendix and pulled out the pelvis.  The indirect showed signs of perforation rupture.  There is no abscess.  The harmonic scalpel was used to take the mesoappendix down to the base of the appendix.  The entire circumference of the appendiceal cecal junction was visualized.  There is significant thickening of the cecum in terminal ileum secondary to peritonitis and inflammation.  Once the appendix was fully visualized the staple was placed across the base of the appendix at the junction of the cecum.  A blue load was fired with a GIA a 45.  The appendix was placed into an Endo Catch bag in pieces since it fragmented with manipulation.  This was pulled out the umbilicus using a extraction bag.  Was passed off the field and was checked and found to be intact as described above.  The base was examined.  It was hemostatic with no signs of leakage.  There was severe inflammation to the cecum and terminal ileum.  2 L  of saline were used and irrigated.  Hemostasis was achieved.  The fluid was clear that returned.  Four-quadrant laparoscopy was performed which showed no other significant abnormality.  Ports were then removed carefully passed off the field.  The umbilical incision closed with a pursestring suture of 0 Vicryl and 4 Monocryl used to close the skin in a subcu manner.  Dermabond applied.  All  counts found to be correct.  The patient was awoke extubated taken to recovery in satisfactory condition.

## 2022-03-03 NOTE — Discharge Instructions (Signed)
[facility]  Please arrive at least 30 min before your appointment to complete your check in paperwork.  If you are unable to arrive 30 min prior to your appointment time we may have to cancel or reschedule you. LAPAROSCOPIC SURGERY: POST OP INSTRUCTIONS Always review your discharge instruction sheet given to you by the facility where your surgery was performed. IF YOU HAVE DISABILITY OR FAMILY LEAVE FORMS, YOU MUST BRING THEM TO THE OFFICE FOR PROCESSING.   DO NOT GIVE THEM TO YOUR DOCTOR.  PAIN CONTROL  First take acetaminophen (Tylenol) AND/or ibuprofen (Advil) to control your pain after surgery.  Follow directions on package.  Taking acetaminophen (Tylenol) and/or ibuprofen (Advil) regularly after surgery will help to control your pain and lower the amount of prescription pain medication you may need.  You should not take more than 4,000 mg (4 grams) of acetaminophen (Tylenol) in 24 hours.  You should not take ibuprofen (Advil), aleve, motrin, naprosyn or other NSAIDS if you have a history of stomach ulcers or chronic kidney disease.  A prescription for pain medication may be given to you upon discharge.  Take your pain medication as prescribed, if you still have uncontrolled pain after taking acetaminophen (Tylenol) or ibuprofen (Advil). Use ice packs to help control pain. If you need a refill on your pain medication, please contact your pharmacy.  They will contact our office to request authorization. Prescriptions will not be filled after 5pm or on week-ends.  HOME MEDICATIONS Take your usually prescribed medications unless otherwise directed.  DIET You should follow a light diet the first few days after arrival home.  Be sure to include lots of fluids daily. Avoid fatty, fried foods.   CONSTIPATION It is common to experience some constipation after surgery and if you are taking pain medication.  Increasing fluid intake and taking a stool softener (such as Colace)  will usually help or prevent this problem from occurring.  A mild laxative (Milk of Magnesia or Miralax) should be taken according to package instructions if there are no bowel movements after 48 hours.  WOUND/INCISION CARE Most patients will experience some swelling and bruising in the area of the incisions.  Ice packs will help.  Swelling and bruising can take several days to resolve.  Unless discharge instructions indicate otherwise, follow guidelines below  STERI-STRIPS - you may remove your outer bandages 48 hours after surgery, and you may shower at that time.  You have steri-strips (small skin tapes) in place directly over the incision.  These strips should be left on the skin for 7-10 days.   DERMABOND/SKIN GLUE - you may shower in 24 hours.  The glue will flake off over the next 2-3 weeks. Any sutures or staples will be removed at the office during your follow-up visit.  ACTIVITIES You may resume regular (light) daily activities beginning the next day--such as daily self-care, walking, climbing stairs--gradually increasing activities as tolerated.  You may have sexual intercourse when it is comfortable.  Refrain from any heavy lifting or straining until approved by your doctor. You may drive when you are no longer taking prescription pain medication, you can comfortably wear a seatbelt, and you can safely maneuver your car and apply brakes.  FOLLOW-UP You should see your doctor in the office for a follow-up appointment approximately 2-3 weeks after your surgery.  You should have been given your post-op/follow-up appointment when your surgery was scheduled.  If you did not receive a post-op/follow-up appointment, make sure   that you call for this appointment within a day or two after you arrive home to insure a convenient appointment time.  OTHER INSTRUCTIONS  WHEN TO CALL YOUR DOCTOR: Fever over 101.0 Inability to urinate Continued bleeding from incision. Increased pain, redness, or  drainage from the incision. Increasing abdominal pain  The clinic staff is available to answer your questions during regular business hours.  Please don't hesitate to call and ask to speak to one of the nurses for clinical concerns.  If you have a medical emergency, go to the nearest emergency room or call 911.  A surgeon from [facility] is always on call at the hospital. [address], [address], [place], [place]  [address] ? [address] [address], , [place]   [address] [phone] ? [phone] ? FAX [phone]   

## 2022-03-03 NOTE — Progress Notes (Signed)
  Progress Note   Patient: Christina [name] JKD:[id number] DOB: [birth year] DOA: 03/02/2022     0 DOS: the patient was seen and examined on 03/03/2022   Brief hospital course: 76 y.o. female with medical history significant for hypertension and history of breast cancer, now presenting to the emergency department with abdominal pain, nausea, and vomiting.  Patient reports that she was feeling constipated for several days, eating raisins to try to relieve this, may have had some mild nausea and abdominal pain as early as 02/27/2022, but pain and nausea became severe early a.m. of 03/02/2022.  Pain is primarily in the RLQ. Pt was found to have evidence of acute appendicitis on CT  Assessment and Plan: 1. Acute appendicitis  - Presents with RLQ pain and N/V    - Blood cultures were collected in ED, 30 mL/kg LR bolus was given, and she was started on Rocephin and Flagyl  - General Surgery consulted and pt is now s/p surgery 7/12   2. AKI  - SCr is 1.49 on admission, up from 0.9 in December 2022  - No hydronephrosis on CT in ED  - Likely acute prerenal azotemia in setting of sepsis and N/V  - Continue IVF hydration - Cr has normalized to 0.9   3. Prolonged QT interval  - QTc is 513 ms in ED  - Electrolytes corrected -Recheck bmet in AM   4. HTN  - BP stable and controlled at this time       Subjective: Pt seen post-op. Without complaints. Thus far tolerating clears  Physical Exam: Vitals:   03/03/22 1245 03/03/22 1300 03/03/22 1328 03/03/22 1440  BP: (!) 95/56 (!) 98/59 102/61 108/72  Pulse: 78 74 76 87  Resp:  '12 16 17  '$ Temp:  98.2 F (36.8 C) 98.8 F (37.1 C) 99 F (37.2 C)  TempSrc:   Oral Oral  SpO2: 95% 96% 95% 93%  Weight:      Height:       General exam: Awake, laying in bed, in nad Respiratory system: Normal respiratory effort, no wheezing Cardiovascular system: regular rate, s1, s2 Gastrointestinal system: Soft, nondistended, positive BS Central nervous system: CN2-12  grossly intact, strength intact Extremities: Perfused, no clubbing Skin: Normal skin turgor, no notable skin lesions seen Psychiatry: Mood normal // no visual hallucinations   Data Reviewed:  Labs reviewed: Na 141, Cr 0.90, WBC 15.6   Family Communication: Pt in room  Disposition: Status is: Inpatient Remains inpatient appropriate because: Severity of illness  Planned Discharge Destination: Home    Author: Marylu Lund, MD 03/03/2022 4:14 PM  For on call review [email or web address].

## 2022-03-03 NOTE — Consult Note (Signed)
[facility] Surgery Consult Note  Christina [name] Grade [birth year]  [id number].    Requesting MD: Marylu Lund Chief Complaint/Reason for Consult: appendicitis  HPI:  Christina [name] is a 76 y.o. female PMH HTN and h/o breast cancer who presented to [facility] last night complaining of abdominal pain. States that the pain started about 3 days ago and has waxed and waned. Pain is mostly in her lower abdomen, worst on the right. Associated with nausea, vomiting, fever, and constipation. She was able to have a BM yesterday but this did no alleviate her pain. In the ED patient was found to be febrile, tachycardic, and hypertensive. Labwork pertinent for elevated WBC 15.8, Cr 1.44. Blood cultures pending. CT abdomen/pelvis reports acute appendicitis without evidence for perforation or abscess; adjacent ileitis likely reactive; small amount of free fluid in the pelvis. Patient was started on IV antibiotics and admitted to the medical service. General surgery asked to see.   Abdominal surgical history: none Anticoagulants: none Lives at home with daughter Ambulates without assistive device   Family History  Problem Relation Age of Onset   Cancer Mother    Diabetes Sister    Lung cancer Brother    Diabetes Brother     Past Medical History:  Diagnosis Date   Anemia    Breast cancer ([place]) 08/28/2015   Bilateral   Breast cancer of upper-outer quadrant of right female breast ([place]) 06/23/2015   High cholesterol    Hx of radiation therapy 11/05/15- 12/16/15   Right Breast and Left Breast   Hypertension    Personal history of radiation therapy    Right Breast Cancer   Vitamin D deficiency 06/29/2018    Past Surgical History:  Procedure Laterality Date   BREAST LUMPECTOMY Left 08/28/2015   BREAST LUMPECTOMY Right 08/28/2015   BREAST LUMPECTOMY WITH NEEDLE LOCALIZATION AND AXILLARY SENTINEL LYMPH NODE BX Bilateral 08/28/2015   Procedure: BILATERAL BREAST LUMPECTOMY WITH BILATERAL  NEEDLE  LOCALIZATION AND BILATERAL AXILLARY SENTINEL LYMPH NODE BX;  Surgeon: Erroll Luna, MD;  Location: [place];  Service: General;  Laterality: Bilateral;    Social History:  reports that she has never smoked. She has never used smokeless tobacco. She reports that she does not drink alcohol and does not use drugs.  Allergies:  Allergies  Allergen Reactions   Onion Nausea And Vomiting    White onion   Simvastatin Other (See Comments)    myalgia   Aspirin Nausea Only and Nausea And Vomiting   Hydrocodone Rash    With itching    Medications Prior to Admission  Medication Sig Dispense Refill   amLODipine (NORVASC) 10 MG tablet Take 1 tablet by mouth daily (dose change). (Patient taking differently: Take 10 mg by mouth at bedtime.) 90 tablet 1   fexofenadine (ALLEGRA) 60 MG tablet Take 1 tablet by mouth 2 times daily. (Patient taking differently: Take 60 mg by mouth daily as needed for allergies.) 60 tablet 0   olmesartan (BENICAR) 20 MG tablet Take 1 tablet (20 mg total) by mouth daily. (Patient taking differently: Take 20 mg by mouth at bedtime.) 90 tablet 3   olopatadine (PATADAY) 0.1 % ophthalmic solution Place 1 drop into both eyes 2 times daily. (Patient taking differently: Place 1 drop into both eyes daily as needed for allergies.) 5 mL 0    Prior to Admission medications   Medication Sig Start Date End Date Taking? Authorizing Provider  amLODipine (NORVASC) 10 MG tablet Take 1 tablet by mouth daily (dose  change). Patient taking differently: Take 10 mg by mouth at bedtime. 07/28/21  Yes Park Meo T, PA-C  fexofenadine (ALLEGRA) 60 MG tablet Take 1 tablet by mouth 2 times daily. Patient taking differently: Take 60 mg by mouth daily as needed for allergies. 12/02/21  Yes Raspet, Erin K, PA-C  olmesartan (BENICAR) 20 MG tablet Take 1 tablet (20 mg total) by mouth daily. Patient taking differently: Take 20 mg by mouth at bedtime. 08/21/21  Yes   olopatadine (PATADAY) 0.1 % ophthalmic  solution Place 1 drop into both eyes 2 times daily. Patient taking differently: Place 1 drop into both eyes daily as needed for allergies. 12/02/21  Yes Raspet, Erin K, PA-C  losartan-hydrochlorothiazide (HYZAAR) 100-25 MG tablet Take 1 tablet by mouth daily. 12/31/20 06/03/21      Blood pressure 124/62, pulse 95, temperature (!) 100.9 F (38.3 C), temperature source Oral, resp. rate 18, height '5\' 6"'$  (1.676 m), weight 83.9 kg, SpO2 94 %. Physical Exam: General: pleasant, WD/WN female who is laying in bed in NAD HEENT: head is normocephalic, atraumatic.  Sclera are noninjected.  Pupils equal and round.  Ears and nose without any masses or lesions.  Mouth is dry Heart: tachycardia HR low 100s Lungs: CTAB, no wheezes, rhonchi, or rales noted.  Respiratory effort nonlabored on room air Abd: soft, mild distension, no masses, hernias, or organomegaly. Diffuse tenderness with guarding upon palpation of the periumbilical region and RLQ MS: no BUE/BLE edema, calves soft and nontender Skin: warm and dry with no masses, lesions, or rashes Psych: A&Ox4 with an appropriate affect Neuro: MAEs, no gross motor or sensory deficits BUE/BLE  Results for orders placed or performed during the hospital encounter of 03/02/22 (from the past 48 hour(s))  Urinalysis, Routine w reflex microscopic     Status: Abnormal   Collection Time: 03/02/22  8:27 PM  Result Value Ref Range   Color, Urine YELLOW YELLOW   APPearance CLEAR CLEAR   Specific Gravity, Urine 1.004 (L) 1.005 - 1.030   pH 6.0 5.0 - 8.0   Glucose, UA NEGATIVE NEGATIVE mg/dL   Hgb urine dipstick SMALL (A) NEGATIVE   Bilirubin Urine NEGATIVE NEGATIVE   Ketones, ur NEGATIVE NEGATIVE mg/dL   Protein, ur NEGATIVE NEGATIVE mg/dL   Nitrite NEGATIVE NEGATIVE   Leukocytes,Ua NEGATIVE NEGATIVE   RBC / HPF 0-5 0 - 5 RBC/hpf   WBC, UA 0-5 0 - 5 WBC/hpf   Bacteria, UA RARE (A) NONE SEEN   Squamous Epithelial / LPF 0-5 0 - 5    Comment: Performed at [facility], [place] [address] [place], [place] [address]  Resp Panel by RT-PCR (Flu A&B, Covid) Anterior Nasal Swab     Status: None   Collection Time: 03/02/22  8:44 PM   Specimen: Anterior Nasal Swab  Result Value Ref Range   SARS Coronavirus 2 by RT PCR NEGATIVE NEGATIVE    Comment: (NOTE) SARS-CoV-2 target nucleic acids are NOT DETECTED.  The SARS-CoV-2 RNA is generally detectable in upper respiratory specimens during the acute phase of infection. The lowest concentration of SARS-CoV-2 viral copies this assay can detect is 138 copies/mL. A negative result does not preclude SARS-Cov-2 infection and should not be used as the sole basis for treatment or other patient management decisions. A negative result may occur with  improper specimen collection/handling, submission of specimen other than nasopharyngeal swab, presence of viral mutation(s) within the areas targeted by this assay, and inadequate number of viral copies(<138 copies/mL). A  negative result must be combined with clinical observations, patient history, and epidemiological information. The expected result is Negative.  Fact Sheet for Patients:  EntrepreneurPulse.com.au  Fact Sheet for Healthcare Providers:  IncredibleEmployment.be  This test is no t yet approved or cleared by the [place] FDA and  has been authorized for detection and/or diagnosis of SARS-CoV-2 by FDA under an Emergency Use Authorization (EUA). This EUA will remain  in effect (meaning this test can be used) for the duration of the COVID-19 declaration under Section 564(b)(1) of the Act, 21 U.S.C.section 360bbb-3(b)(1), unless the authorization is terminated  or revoked sooner.       Influenza A by PCR NEGATIVE NEGATIVE   Influenza B by PCR NEGATIVE NEGATIVE    Comment: (NOTE) The Xpert Xpress SARS-CoV-2/FLU/RSV plus assay is intended as an aid in the diagnosis of influenza from Nasopharyngeal swab  specimens and should not be used as a sole basis for treatment. Nasal washings and aspirates are unacceptable for Xpert Xpress SARS-CoV-2/FLU/RSV testing.  Fact Sheet for Patients: EntrepreneurPulse.com.au  Fact Sheet for Healthcare Providers: IncredibleEmployment.be  This test is not yet approved or cleared by the [place] FDA and has been authorized for detection and/or diagnosis of SARS-CoV-2 by FDA under an Emergency Use Authorization (EUA). This EUA will remain in effect (meaning this test can be used) for the duration of the COVID-19 declaration under Section 564(b)(1) of the Act, 21 U.S.C. section 360bbb-3(b)(1), unless the authorization is terminated or revoked.  Performed at [facility], [place] [address] [place], [place] [address]   Lipase, blood     Status: None   Collection Time: 03/02/22  9:19 PM  Result Value Ref Range   Lipase 24 11 - 51 U/L    Comment: Performed at [facility], [place] [address] [place], [place] [address]  Comprehensive metabolic panel     Status: Abnormal   Collection Time: 03/02/22  9:19 PM  Result Value Ref Range   Sodium 138 135 - 145 mmol/L   Potassium 3.8 3.5 - 5.1 mmol/L   Chloride 105 98 - 111 mmol/L   CO2 22 22 - 32 mmol/L   Glucose, Bld 117 (H) 70 - 99 mg/dL    Comment: Glucose reference range applies only to samples taken after fasting for at least 8 hours.   BUN 19 8 - 23 mg/dL   Creatinine, Ser 1.44 (H) 0.44 - 1.00 mg/dL   Calcium 9.2 8.9 - 10.3 mg/dL   Total Protein 8.1 6.5 - 8.1 g/dL   Albumin 4.1 3.5 - 5.0 g/dL   AST 27 15 - 41 U/L   ALT 19 0 - 44 U/L   Alkaline Phosphatase 120 38 - 126 U/L   Total Bilirubin 1.4 (H) 0.3 - 1.2 mg/dL   GFR, Estimated 38 (L) >60 mL/min    Comment: (NOTE) Calculated using the CKD-EPI Creatinine Equation (2021)    Anion gap 11 5 - 15    Comment: Performed at [facility], [place] [address]  [place], [place] [address]  CBC     Status: Abnormal   Collection Time: 03/02/22  9:19 PM  Result Value Ref Range   WBC 15.8 (H) 4.0 - 10.5 K/uL   RBC 4.48 3.87 - 5.11 MIL/uL   Hemoglobin 12.1 12.0 - 15.0 g/dL   HCT 37.0 36.0 - 46.0 %   MCV 82.6 80.0 - 100.0 fL   MCH 27.0 26.0 - 34.0 pg   MCHC 32.7 30.0 -  36.0 g/dL   RDW 12.9 11.5 - 15.5 %   Platelets 279 150 - 400 K/uL   nRBC 0.0 0.0 - 0.2 %    Comment: Performed at [facility], [place] [address] [place], [place] [address]  Lactic acid, plasma     Status: None   Collection Time: 03/02/22  9:19 PM  Result Value Ref Range   Lactic Acid, Venous 1.7 0.5 - 1.9 mmol/L    Comment: Performed at [facility], [place] [address], [place], [place] [address]  Protime-INR     Status: None   Collection Time: 03/02/22  9:19 PM  Result Value Ref Range   Prothrombin Time 14.3 11.4 - 15.2 seconds   INR 1.1 0.8 - 1.2    Comment: (NOTE) INR goal varies based on device and disease states. Performed at [facility], [place] [address], [place], [place] [address]   APTT     Status: None   Collection Time: 03/02/22  9:19 PM  Result Value Ref Range   aPTT 29 24 - 36 seconds    Comment: Performed at [facility], [place] [address] [place], [place] [address]  Blood Culture (routine x 2)     Status: None (Preliminary result)   Collection Time: 03/02/22  9:21 PM   Specimen: BLOOD  Result Value Ref Range   Specimen Description      BLOOD RIGHT ANTECUBITAL Performed at [facility], [place] [address], [place], [place] [address]    Special Requests      BOTTLES DRAWN AEROBIC AND ANAEROBIC Blood Culture results may not be optimal due to an excessive volume of blood received in culture bottles Performed at [place] [address], [place], [place] [address]    Culture      NO GROWTH < 12 HOURS Performed at [place] [address],  [place], [place] [address]    Report Status PENDING   Blood Culture (routine x 2)     Status: None (Preliminary result)   Collection Time: 03/02/22  9:29 PM   Specimen: BLOOD  Result Value Ref Range   Specimen Description      BLOOD LEFT ANTECUBITAL Performed at [place] [address], [place], [place] [address]    Special Requests      BOTTLES DRAWN AEROBIC AND ANAEROBIC Blood Culture results may not be optimal due to an excessive volume of blood received in culture bottles Performed at [place] [address] [place], [place] [address]    Culture      NO GROWTH < 12 HOURS Performed at [place] [address] [place], [place] [address]    Report Status PENDING   Lactic acid, plasma     Status: Abnormal   Collection Time: 03/02/22 10:44 PM  Result Value Ref Range   Lactic Acid, Venous 2.3 (HH) 0.5 - 1.9 mmol/L    Comment: CRITICAL RESULT CALLED TO, READ BACK BY AND VERIFIED WITH SMITH,A AT 0001 ON 03/03/22 BY VAZQUEZ,J Performed at [facility], [place] [address], [place], [place] [address]   Lactic acid, plasma     Status: None   Collection Time: 03/03/22 12:48 AM  Result Value Ref Range   Lactic Acid, Venous 1.9 0.5 - 1.9 mmol/L    Comment: RESULTS VERIFIED BY REPEAT TESTING Performed at [place] [address] [place], [place] [address]  Basic metabolic panel     Status: Abnormal   Collection Time: 03/03/22  6:27 AM  Result Value Ref Range   Sodium 141 135 - 145 mmol/L   Potassium 4.0 3.5 - 5.1 mmol/L   Chloride 107 98 - 111 mmol/L   CO2 26 22 - 32 mmol/L   Glucose, Bld 102 (H) 70 - 99 mg/dL    Comment: Glucose reference range applies only to samples taken after fasting for at least 8 hours.   BUN 16 8 - 23 mg/dL   Creatinine, Ser 0.90 0.44 - 1.00 mg/dL   Calcium 8.6 (L) 8.9 - 10.3 mg/dL   GFR, Estimated >60 >60 mL/min    Comment: (NOTE) Calculated using the CKD-EPI  Creatinine Equation (2021)    Anion gap 8 5 - 15    Comment: Performed at [facility], [place] [address], [place], [place] [address]  CBC     Status: Abnormal   Collection Time: 03/03/22  6:27 AM  Result Value Ref Range   WBC 15.6 (H) 4.0 - 10.5 K/uL   RBC 3.75 (L) 3.87 - 5.11 MIL/uL   Hemoglobin 10.1 (L) 12.0 - 15.0 g/dL   HCT 31.4 (L) 36.0 - 46.0 %   MCV 83.7 80.0 - 100.0 fL   MCH 26.9 26.0 - 34.0 pg   MCHC 32.2 30.0 - 36.0 g/dL   RDW 12.9 11.5 - 15.5 %   Platelets 211 150 - 400 K/uL   nRBC 0.0 0.0 - 0.2 %    Comment: Performed at [facility], [place] [address], [place], [place] [address]  Magnesium     Status: None   Collection Time: 03/03/22  6:27 AM  Result Value Ref Range   Magnesium 1.9 1.7 - 2.4 mg/dL    Comment: Performed at [facility], [place] [address], [place], [place] [address]   CT ABDOMEN PELVIS W CONTRAST  Result Date: 03/03/2022 CLINICAL DATA:  Left lower quadrant abdominal pain. EXAM: CT ABDOMEN AND PELVIS WITH CONTRAST TECHNIQUE: Multidetector CT imaging of the abdomen and pelvis was performed using the standard protocol following bolus administration of intravenous contrast. RADIATION DOSE REDUCTION: This exam was performed according to the departmental dose-optimization program which includes automated exposure control, adjustment of the mA and/or kV according to patient size and/or use of iterative reconstruction technique. CONTRAST:  123m OMNIPAQUE IOHEXOL 300 MG/ML  SOLN COMPARISON:  None Available. FINDINGS: Lower chest: No acute abnormality. Hepatobiliary: No focal liver abnormality is seen. No gallstones, gallbladder wall thickening, or biliary dilatation. Pancreas: Unremarkable. No pancreatic ductal dilatation or surrounding inflammatory changes. Spleen: Normal in size without focal abnormality. Adrenals/Urinary Tract: There is a 3.6 cm right renal cyst. There is a 1.8 cm left renal cyst. Otherwise, the adrenal  glands, kidneys and bladder are within normal limits. Stomach/Bowel: The appendix is dilated and fluid-filled measuring 15 mm in diameter. There is surrounding inflammatory stranding. There is no evidence for perforation or abscess. There is also wall thickening and inflammation involving the adjacent ileal loops in the pelvis and right lower quadrant. There is no pneumatosis or bowel obstruction. There is colonic diverticulosis. Stomach is within normal limits. Vascular/Lymphatic: No significant vascular findings are present. There is an enlarged right lower quadrant lymph node in the mesentery measuring 15 mm short axis. Reproductive: Status post hysterectomy. No adnexal masses. Other: Small amount of free fluid in the pelvis. Small fat containing umbilical hernia. Musculoskeletal: The bones are osteopenic. Degenerative changes affect the spine.  IMPRESSION: 1. Acute appendicitis.  No evidence for perforation or abscess. 2. Adjacent ileitis, likely reactive. 3. Small amount of free fluid in the pelvis. 4. Enlarged right lower quadrant lymph node, likely reactive. Electronically Signed   By: Ronney Asters M.D.   On: 03/03/2022 00:00   DG Chest 2 View  Result Date: 03/02/2022 CLINICAL DATA:  Questionable sepsis. EXAM: CHEST - 2 VIEW COMPARISON:  Chest x-ray 02/10/2005 FINDINGS: The heart size and mediastinal contours are within normal limits. Both lungs are clear. The visualized skeletal structures are unremarkable. IMPRESSION: No active cardiopulmonary disease. Electronically Signed   By: Ronney Asters M.D.   On: 03/02/2022 21:19    Anti-infectives (From admission, onward)    Start     Dose/Rate Route Frequency Ordered Stop   03/03/22 2045  cefTRIAXone (ROCEPHIN) 2 g in sodium chloride 0.9 % 100 mL IVPB  Status:  Discontinued        2 g 200 mL/hr over 30 Minutes Intravenous Every 24 hours 03/03/22 0049 03/03/22 0749   03/03/22 1400  [MAR Hold]  piperacillin-tazobactam (ZOSYN) IVPB 3.375 g        (MAR  Hold since Wed 03/03/2022 at 0850.Hold Reason: Transfer to a Procedural area)   3.375 g 12.5 mL/hr over 240 Minutes Intravenous Every 8 hours 03/03/22 0749     03/03/22 0845  metroNIDAZOLE (FLAGYL) IVPB 500 mg  Status:  Discontinued        500 mg 100 mL/hr over 60 Minutes Intravenous Every 12 hours 03/03/22 0049 03/03/22 0749   03/02/22 2045  cefTRIAXone (ROCEPHIN) 2 g in sodium chloride 0.9 % 100 mL IVPB        2 g 200 mL/hr over 30 Minutes Intravenous  Once 03/02/22 2044 03/02/22 2210   03/02/22 2045  metroNIDAZOLE (FLAGYL) IVPB 500 mg        500 mg 100 mL/hr over 60 Minutes Intravenous  Once 03/02/22 2044 03/02/22 2312        Assessment/Plan Acute appendicitis - Patient with clinical and radiographic findings consistent with acute appendicitis. CT shows a dilated and fluid filled appendix with significant surrounding inflammatory changes, but no evidence of perforation or abscess. She is tachycardic and febrile. Recommend laparoscopic appendectomy. I have discussed the procedure and risks of appendectomy. The risks include but are not limited to bleeding, infection, wound problems, anesthesia, injury to intra-abdominal organs, possibility of open procedure or ileocecectomy, and possibility of postoperative ileus. She seems to understand and agrees with the plan. Will change antibiotics to zosyn. Keep NPO. Plan for OR later this morning.   ID - rocephin/flagyl x1, zosyn 7/12>> VTE - SCDs only for now for procedure FEN - IVF, NPO Foley - none  AKI HTN Hx Breast cancer  I reviewed ED provider notes, hospitalist notes, last 24 h vitals and pain scores, last 48 h intake and output, last 24 h labs and trends, and last 24 h imaging results   Wellington Hampshire, [facility] Surgery 03/03/2022, 8:47 AM Please see Amion for pager number during day hours 7:00am-4:30pm

## 2022-03-03 NOTE — Hospital Course (Signed)
76 y.o. female with medical history significant for hypertension and history of breast cancer, now presenting to the emergency department with abdominal pain, nausea, and vomiting.  Patient reports that she was feeling constipated for several days, eating raisins to try to relieve this, may have had some mild nausea and abdominal pain as early as 02/27/2022, but pain and nausea became severe early a.m. of 03/02/2022.  Pain is primarily in the RLQ. Pt was found to have evidence of acute appendicitis on CT

## 2022-03-03 NOTE — Transfer of Care (Signed)
Immediate Anesthesia Transfer of Care Note  Patient: Christina [name]  Procedure(s) Performed: Procedure(s): APPENDECTOMY LAPAROSCOPIC (N/A)  Patient Location: PACU  Anesthesia Type:General  Level of Consciousness:  sedated, patient cooperative and responds to stimulation  Airway & Oxygen Therapy:Patient Spontanous Breathing and Patient connected to face mask oxgen  Post-op Assessment:  Report given to PACU RN and Post -op Vital signs reviewed and stable  Post vital signs:  Reviewed and stable  Last Vitals:  Vitals:   03/03/22 0702 03/03/22 0851  BP: 124/62 131/64  Pulse: 95 (!) 103  Resp: 18 18  Temp: (!) 38.3 C (!) 38.3 C  SpO2: 49% 32%    Complications: No apparent anesthesia complications

## 2022-03-03 NOTE — Sepsis Progress Note (Signed)
Notified provider of need to order repeat lactic acid. ° °

## 2022-03-03 NOTE — H&P (Signed)
History and Physical    Christina [name] GTX:[id number] DOB: June 03, 1946 DOA: 03/02/2022  PCP: Andria Frames, PA-C   Patient coming from: Home   Chief Complaint: Abdominal pain, N/V   HPI: Christina [name] is a pleasant 76 y.o. female with medical history significant for hypertension and history of breast cancer, now presenting to the emergency department with abdominal pain, nausea, and vomiting.  Patient reports that she was feeling constipated for several days, eating raisins to try to relieve this, may have had some mild nausea and abdominal pain as early as 02/27/2022, but pain and nausea became severe early a.m. of 03/02/2022.  Pain is primarily in the RLQ.  She reports having a large bowel movement yesterday morning without blood.  ED Course: Upon arrival to the ED, patient is found to be febrile to 38.6 and tachycardic with stable blood pressure.  ED work-up notable for WBC 15,800, lactic acid 2.3, creatinine 1.44, prolonged QT interval on EKG, and CT findings concerning for acute appendicitis without perforation or abscess.  Blood cultures were collected in the ED, 3 L of LR were administered, Rocephin and Flagyl were started, and surgery was consulted by the ED physician.  Review of Systems:  All other systems reviewed and apart from HPI, are negative.  Past Medical History:  Diagnosis Date   Anemia    Breast cancer ([place]) 08/28/2015   Bilateral   Breast cancer of upper-outer quadrant of right female breast ([place]) 06/23/2015   High cholesterol    Hx of radiation therapy 11/05/15- 12/16/15   Right Breast and Left Breast   Hypertension    Personal history of radiation therapy    Right Breast Cancer   Vitamin D deficiency 06/29/2018    Past Surgical History:  Procedure Laterality Date   BREAST LUMPECTOMY Left 08/28/2015   BREAST LUMPECTOMY Right 08/28/2015   BREAST LUMPECTOMY WITH NEEDLE LOCALIZATION AND AXILLARY SENTINEL LYMPH NODE BX Bilateral 08/28/2015   Procedure: BILATERAL  BREAST LUMPECTOMY WITH BILATERAL  NEEDLE LOCALIZATION AND BILATERAL AXILLARY SENTINEL LYMPH NODE BX;  Surgeon: Erroll Luna, MD;  Location: [facility];  Service: General;  Laterality: Bilateral;    Social History:   reports that she has never smoked. She has never used smokeless tobacco. She reports that she does not drink alcohol and does not use drugs.  Allergies  Allergen Reactions   Onion Nausea And Vomiting    White onion   Simvastatin Other (See Comments)    myalgia   Aspirin Nausea Only and Nausea And Vomiting   Hydrocodone Rash    With itching    Family History  Problem Relation Age of Onset   Cancer Mother    Diabetes Sister    Lung cancer Brother    Diabetes Brother      Prior to Admission medications   Medication Sig Start Date End Date Taking? Authorizing Provider  amLODipine (NORVASC) 10 MG tablet Take 1 tablet by mouth daily (dose change). Patient taking differently: Take 10 mg by mouth at bedtime. 07/28/21  Yes Park Meo T, PA-C  fexofenadine (ALLEGRA) 60 MG tablet Take 1 tablet by mouth 2 times daily. Patient taking differently: Take 60 mg by mouth daily as needed for allergies. 12/02/21  Yes Raspet, Erin K, PA-C  olmesartan (BENICAR) 20 MG tablet Take 1 tablet (20 mg total) by mouth daily. Patient taking differently: Take 20 mg by mouth at bedtime. 08/21/21  Yes   olopatadine (PATADAY) 0.1 % ophthalmic solution Place 1 drop into both  eyes 2 times daily. Patient taking differently: Place 1 drop into both eyes daily as needed for allergies. 12/02/21  Yes Raspet, Erin K, PA-C  losartan-hydrochlorothiazide (HYZAAR) 100-25 MG tablet Take 1 tablet by mouth daily. 12/31/20 06/03/21      Physical Exam: Vitals:   03/03/22 0030 03/03/22 0050 03/03/22 0100 03/03/22 0227  BP: (!) 165/78  (!) 143/81 135/67  Pulse: 93  88 87  Resp:    20  Temp:  99.6 F (37.6 C)  99.3 F (37.4 C)  TempSrc:    Oral  SpO2: 95%  97% 98%  Weight:      Height:         Constitutional: NAD, calm  Eyes: PERTLA, lids and conjunctivae normal ENMT: Mucous membranes are moist. Posterior pharynx clear of any exudate or lesions.   Neck: supple, no masses  Respiratory: no wheezing, no crackles. No accessory muscle use.  Cardiovascular: S1 & S2 heard, regular rate and rhythm. No significant JVD. Abdomen: soft, tender in RLQ, no guarding. Bowel sounds hypoactive.  Musculoskeletal: no clubbing / cyanosis. No joint deformity upper and lower extremities.   Skin: no significant rashes, lesions, ulcers. Warm, dry, well-perfused. Neurologic: CN 2-12 grossly intact. Moving all extremities. Alert and oriented.  Psychiatric: Pleasant. Cooperative.    Labs and Imaging on Admission: I have personally reviewed following labs and imaging studies  CBC: Recent Labs  Lab 03/02/22 2119  WBC 15.8*  HGB 12.1  HCT 37.0  MCV 82.6  PLT 485   Basic Metabolic Panel: Recent Labs  Lab 03/02/22 2119  NA 138  K 3.8  CL 105  CO2 22  GLUCOSE 117*  BUN 19  CREATININE 1.44*  CALCIUM 9.2   GFR: Estimated Creatinine Clearance: 37.4 mL/min (A) (by C-G formula based on SCr of 1.44 mg/dL (H)). Liver Function Tests: Recent Labs  Lab 03/02/22 2119  AST 27  ALT 19  ALKPHOS 120  BILITOT 1.4*  PROT 8.1  ALBUMIN 4.1   Recent Labs  Lab 03/02/22 2119  LIPASE 24   No results for input(s): "AMMONIA" in the last 168 hours. Coagulation Profile: Recent Labs  Lab 03/02/22 2119  INR 1.1   Cardiac Enzymes: No results for input(s): "CKTOTAL", "CKMB", "CKMBINDEX", "TROPONINI" in the last 168 hours. BNP (last 3 results) No results for input(s): "PROBNP" in the last 8760 hours. HbA1C: No results for input(s): "HGBA1C" in the last 72 hours. CBG: No results for input(s): "GLUCAP" in the last 168 hours. Lipid Profile: No results for input(s): "CHOL", "HDL", "LDLCALC", "TRIG", "CHOLHDL", "LDLDIRECT" in the last 72 hours. Thyroid Function Tests: No results for input(s):  "TSH", "T4TOTAL", "FREET4", "T3FREE", "THYROIDAB" in the last 72 hours. Anemia Panel: No results for input(s): "VITAMINB12", "FOLATE", "FERRITIN", "TIBC", "IRON", "RETICCTPCT" in the last 72 hours. Urine analysis:    Component Value Date/Time   COLORURINE YELLOW 03/02/2022 2027   APPEARANCEUR CLEAR 03/02/2022 2027   LABSPEC 1.004 (L) 03/02/2022 2027   PHURINE 6.0 03/02/2022 2027   GLUCOSEU NEGATIVE 03/02/2022 2027   HGBUR SMALL (A) 03/02/2022 2027   HGBUR negative 04/01/2010 0000   BILIRUBINUR NEGATIVE 03/02/2022 2027   KETONESUR NEGATIVE 03/02/2022 2027   PROTEINUR NEGATIVE 03/02/2022 2027   UROBILINOGEN 0.2 10/25/2017 2022   NITRITE NEGATIVE 03/02/2022 2027   LEUKOCYTESUR NEGATIVE 03/02/2022 2027   Sepsis Labs: '@LABRCNTIP'$ (procalcitonin:4,lacticidven:4) ) Recent Results (from the past 240 hour(s))  Resp Panel by RT-PCR (Flu A&B, Covid) Anterior Nasal Swab     Status: None   Collection Time:  03/02/22  8:44 PM   Specimen: Anterior Nasal Swab  Result Value Ref Range Status   SARS Coronavirus 2 by RT PCR NEGATIVE NEGATIVE Final    Comment: (NOTE) SARS-CoV-2 target nucleic acids are NOT DETECTED.  The SARS-CoV-2 RNA is generally detectable in upper respiratory specimens during the acute phase of infection. The lowest concentration of SARS-CoV-2 viral copies this assay can detect is 138 copies/mL. A negative result does not preclude SARS-Cov-2 infection and should not be used as the sole basis for treatment or other patient management decisions. A negative result may occur with  improper specimen collection/handling, submission of specimen other than nasopharyngeal swab, presence of viral mutation(s) within the areas targeted by this assay, and inadequate number of viral copies(<138 copies/mL). A negative result must be combined with clinical observations, patient history, and epidemiological information. The expected result is Negative.  Fact Sheet for Patients:   EntrepreneurPulse.com.au  Fact Sheet for Healthcare Providers:  IncredibleEmployment.be  This test is no t yet approved or cleared by the [place] FDA and  has been authorized for detection and/or diagnosis of SARS-CoV-2 by FDA under an Emergency Use Authorization (EUA). This EUA will remain  in effect (meaning this test can be used) for the duration of the COVID-19 declaration under Section 564(b)(1) of the Act, 21 U.S.C.section 360bbb-3(b)(1), unless the authorization is terminated  or revoked sooner.       Influenza A by PCR NEGATIVE NEGATIVE Final   Influenza B by PCR NEGATIVE NEGATIVE Final    Comment: (NOTE) The Xpert Xpress SARS-CoV-2/FLU/RSV plus assay is intended as an aid in the diagnosis of influenza from Nasopharyngeal swab specimens and should not be used as a sole basis for treatment. Nasal washings and aspirates are unacceptable for Xpert Xpress SARS-CoV-2/FLU/RSV testing.  Fact Sheet for Patients: EntrepreneurPulse.com.au  Fact Sheet for Healthcare Providers: IncredibleEmployment.be  This test is not yet approved or cleared by the [place] FDA and has been authorized for detection and/or diagnosis of SARS-CoV-2 by FDA under an Emergency Use Authorization (EUA). This EUA will remain in effect (meaning this test can be used) for the duration of the COVID-19 declaration under Section 564(b)(1) of the Act, 21 U.S.C. section 360bbb-3(b)(1), unless the authorization is terminated or revoked.  Performed at [facility], [place] [address], [place], [place] [address]      Radiological Exams on Admission: CT ABDOMEN PELVIS W CONTRAST  Result Date: 03/03/2022 CLINICAL DATA:  Left lower quadrant abdominal pain. EXAM: CT ABDOMEN AND PELVIS WITH CONTRAST TECHNIQUE: Multidetector CT imaging of the abdomen and pelvis was performed using the standard protocol following bolus  administration of intravenous contrast. RADIATION DOSE REDUCTION: This exam was performed according to the departmental dose-optimization program which includes automated exposure control, adjustment of the mA and/or kV according to patient size and/or use of iterative reconstruction technique. CONTRAST:  168m OMNIPAQUE IOHEXOL 300 MG/ML  SOLN COMPARISON:  None Available. FINDINGS: Lower chest: No acute abnormality. Hepatobiliary: No focal liver abnormality is seen. No gallstones, gallbladder wall thickening, or biliary dilatation. Pancreas: Unremarkable. No pancreatic ductal dilatation or surrounding inflammatory changes. Spleen: Normal in size without focal abnormality. Adrenals/Urinary Tract: There is a 3.6 cm right renal cyst. There is a 1.8 cm left renal cyst. Otherwise, the adrenal glands, kidneys and bladder are within normal limits. Stomach/Bowel: The appendix is dilated and fluid-filled measuring 15 mm in diameter. There is surrounding inflammatory stranding. There is no evidence for perforation or abscess. There is also wall thickening and  inflammation involving the adjacent ileal loops in the pelvis and right lower quadrant. There is no pneumatosis or bowel obstruction. There is colonic diverticulosis. Stomach is within normal limits. Vascular/Lymphatic: No significant vascular findings are present. There is an enlarged right lower quadrant lymph node in the mesentery measuring 15 mm short axis. Reproductive: Status post hysterectomy. No adnexal masses. Other: Small amount of free fluid in the pelvis. Small fat containing umbilical hernia. Musculoskeletal: The bones are osteopenic. Degenerative changes affect the spine. IMPRESSION: 1. Acute appendicitis.  No evidence for perforation or abscess. 2. Adjacent ileitis, likely reactive. 3. Small amount of free fluid in the pelvis. 4. Enlarged right lower quadrant lymph node, likely reactive. Electronically Signed   By: Ronney Asters M.D.   On: 03/03/2022  00:00   DG Chest 2 View  Result Date: 03/02/2022 CLINICAL DATA:  Questionable sepsis. EXAM: CHEST - 2 VIEW COMPARISON:  Chest x-ray 02/10/2005 FINDINGS: The heart size and mediastinal contours are within normal limits. Both lungs are clear. The visualized skeletal structures are unremarkable. IMPRESSION: No active cardiopulmonary disease. Electronically Signed   By: Ronney Asters M.D.   On: 03/02/2022 21:19    EKG: Independently reviewed. Sinus tachycardia, artifact, QTc 513 ms.   Assessment/Plan  1. Acute appendicitis  - Presents with RLQ pain and N/V   - Does not appear toxic but has multiple SIRS criteria and AKI concerning for severe sepsis  - Blood cultures were collected in ED, 30 mL/kg LR bolus was given, and she was started on Rocephin and Flagyl  - Appreciate surgery consultation, will follow-up on recommendations and continue bowel rest, IVF, pain-control, and Rocephin and Flagyl for now    2. AKI  - SCr is 1.49 on admission, up from 0.9 in December 2022  - No hydronephrosis on CT in ED  - Likely acute prerenal azotemia in setting of sepsis and N/V  - Continue IVF hydration, renally-dose medications, repeat chem panel    3. Prolonged QT interval  - QTc is 513 ms in ED  - Replace potassium to 4, check magnesium, avoid QT-prolonging medications   4. HTN  - Treat as-needed only for now    DVT prophylaxis: SCDs  Code Status: Full  Level of Care: Level of care: Telemetry Family Communication: none present  Disposition Plan:  Patient is from: home  Anticipated d/c is to: home Anticipated d/c date is: 03/06/22  Patient currently: pending surgical consultation, surgery or improvement with IV antibiotics and transition to oral abx Consults called: surgery  Admission status: Inpatient     Vianne Bulls, MD [facility]  03/03/2022, 2:55 AM

## 2022-03-03 NOTE — ED Notes (Signed)
Pt assisted with getting on the bedside commode. Pt now comfortable and resting in bed.

## 2022-03-03 NOTE — Anesthesia Preprocedure Evaluation (Signed)
Anesthesia Evaluation  Patient identified by MRN, date of birth, ID band Patient awake    Reviewed: Allergy & Precautions, NPO status , Patient's Chart, lab work & pertinent test results  Airway Mallampati: II  TM Distance: >3 FB Neck ROM: Full    Dental  (+) Teeth Intact, Dental Advisory Given   Pulmonary neg pulmonary ROS,    Pulmonary exam normal breath sounds clear to auscultation       Cardiovascular hypertension, Pt. on medications Normal cardiovascular exam Rhythm:Regular Rate:Normal     Neuro/Psych negative neurological ROS     GI/Hepatic Neg liver ROS, Appendicitis    Endo/Other  negative endocrine ROS  Renal/GU negative Renal ROS     Musculoskeletal negative musculoskeletal ROS (+)   Abdominal   Peds  Hematology  (+) Blood dyscrasia, anemia ,   Anesthesia Other Findings H/o breast cancer   Reproductive/Obstetrics                            Anesthesia Physical Anesthesia Plan  ASA: 2  Anesthesia Plan: General   Post-op Pain Management: Tylenol PO (pre-op)*   Induction: Intravenous  PONV Risk Score and Plan: 4 or greater and Dexamethasone, Ondansetron and Treatment may vary due to age or medical condition  Airway Management Planned: Oral ETT  Additional Equipment:   Intra-op Plan:   Post-operative Plan: Extubation in OR  Informed Consent: I have reviewed the patients History and Physical, chart, labs and discussed the procedure including the risks, benefits and alternatives for the proposed anesthesia with the patient or authorized representative who has indicated his/her understanding and acceptance.     Dental advisory given  Plan Discussed with: CRNA  Anesthesia Plan Comments:        Anesthesia Quick Evaluation

## 2022-03-03 NOTE — H&P (View-Only) (Signed)
[facility] Surgery Consult Note  Christina [name] August 07, 1946  [id number].    Requesting MD: Marylu Lund Chief Complaint/Reason for Consult: appendicitis  HPI:  Christina [name] is a 76 y.o. female PMH HTN and h/o breast cancer who presented to [facility] last night complaining of abdominal pain. States that the pain started about 3 days ago and has waxed and waned. Pain is mostly in her lower abdomen, worst on the right. Associated with nausea, vomiting, fever, and constipation. She was able to have a BM yesterday but this did no alleviate her pain. In the ED patient was found to be febrile, tachycardic, and hypertensive. Labwork pertinent for elevated WBC 15.8, Cr 1.44. Blood cultures pending. CT abdomen/pelvis reports acute appendicitis without evidence for perforation or abscess; adjacent ileitis likely reactive; small amount of free fluid in the pelvis. Patient was started on IV antibiotics and admitted to the medical service. General surgery asked to see.   Abdominal surgical history: none Anticoagulants: none Lives at home with daughter Ambulates without assistive device   Family History  Problem Relation Age of Onset   Cancer Mother    Diabetes Sister    Lung cancer Brother    Diabetes Brother     Past Medical History:  Diagnosis Date   Anemia    Breast cancer ([place]) 08/28/2015   Bilateral   Breast cancer of upper-outer quadrant of right female breast ([place]) 06/23/2015   High cholesterol    Hx of radiation therapy 11/05/15- 12/16/15   Right Breast and Left Breast   Hypertension    Personal history of radiation therapy    Right Breast Cancer   Vitamin D deficiency 06/29/2018    Past Surgical History:  Procedure Laterality Date   BREAST LUMPECTOMY Left 08/28/2015   BREAST LUMPECTOMY Right 08/28/2015   BREAST LUMPECTOMY WITH NEEDLE LOCALIZATION AND AXILLARY SENTINEL LYMPH NODE BX Bilateral 08/28/2015   Procedure: BILATERAL BREAST LUMPECTOMY WITH BILATERAL  NEEDLE  LOCALIZATION AND BILATERAL AXILLARY SENTINEL LYMPH NODE BX;  Surgeon: Erroll Luna, MD;  Location: [place];  Service: General;  Laterality: Bilateral;    Social History:  reports that she has never smoked. She has never used smokeless tobacco. She reports that she does not drink alcohol and does not use drugs.  Allergies:  Allergies  Allergen Reactions   Onion Nausea And Vomiting    White onion   Simvastatin Other (See Comments)    myalgia   Aspirin Nausea Only and Nausea And Vomiting   Hydrocodone Rash    With itching    Medications Prior to Admission  Medication Sig Dispense Refill   amLODipine (NORVASC) 10 MG tablet Take 1 tablet by mouth daily (dose change). (Patient taking differently: Take 10 mg by mouth at bedtime.) 90 tablet 1   fexofenadine (ALLEGRA) 60 MG tablet Take 1 tablet by mouth 2 times daily. (Patient taking differently: Take 60 mg by mouth daily as needed for allergies.) 60 tablet 0   olmesartan (BENICAR) 20 MG tablet Take 1 tablet (20 mg total) by mouth daily. (Patient taking differently: Take 20 mg by mouth at bedtime.) 90 tablet 3   olopatadine (PATADAY) 0.1 % ophthalmic solution Place 1 drop into both eyes 2 times daily. (Patient taking differently: Place 1 drop into both eyes daily as needed for allergies.) 5 mL 0    Prior to Admission medications   Medication Sig Start Date End Date Taking? Authorizing Provider  amLODipine (NORVASC) 10 MG tablet Take 1 tablet by mouth daily (dose  change). Patient taking differently: Take 10 mg by mouth at bedtime. 07/28/21  Yes Park Meo T, PA-C  fexofenadine (ALLEGRA) 60 MG tablet Take 1 tablet by mouth 2 times daily. Patient taking differently: Take 60 mg by mouth daily as needed for allergies. 12/02/21  Yes Raspet, Erin K, PA-C  olmesartan (BENICAR) 20 MG tablet Take 1 tablet (20 mg total) by mouth daily. Patient taking differently: Take 20 mg by mouth at bedtime. 08/21/21  Yes   olopatadine (PATADAY) 0.1 % ophthalmic  solution Place 1 drop into both eyes 2 times daily. Patient taking differently: Place 1 drop into both eyes daily as needed for allergies. 12/02/21  Yes Raspet, Erin K, PA-C  losartan-hydrochlorothiazide (HYZAAR) 100-25 MG tablet Take 1 tablet by mouth daily. 12/31/20 06/03/21      Blood pressure 124/62, pulse 95, temperature (!) 100.9 F (38.3 C), temperature source Oral, resp. rate 18, height '5\' 6"'$  (1.676 m), weight 83.9 kg, SpO2 94 %. Physical Exam: General: pleasant, WD/WN female who is laying in bed in NAD HEENT: head is normocephalic, atraumatic.  Sclera are noninjected.  Pupils equal and round.  Ears and nose without any masses or lesions.  Mouth is dry Heart: tachycardia HR low 100s Lungs: CTAB, no wheezes, rhonchi, or rales noted.  Respiratory effort nonlabored on room air Abd: soft, mild distension, no masses, hernias, or organomegaly. Diffuse tenderness with guarding upon palpation of the periumbilical region and RLQ MS: no BUE/BLE edema, calves soft and nontender Skin: warm and dry with no masses, lesions, or rashes Psych: A&Ox4 with an appropriate affect Neuro: MAEs, no gross motor or sensory deficits BUE/BLE  Results for orders placed or performed during the hospital encounter of 03/02/22 (from the past 48 hour(s))  Urinalysis, Routine w reflex microscopic     Status: Abnormal   Collection Time: 03/02/22  8:27 PM  Result Value Ref Range   Color, Urine YELLOW YELLOW   APPearance CLEAR CLEAR   Specific Gravity, Urine 1.004 (L) 1.005 - 1.030   pH 6.0 5.0 - 8.0   Glucose, UA NEGATIVE NEGATIVE mg/dL   Hgb urine dipstick SMALL (A) NEGATIVE   Bilirubin Urine NEGATIVE NEGATIVE   Ketones, ur NEGATIVE NEGATIVE mg/dL   Protein, ur NEGATIVE NEGATIVE mg/dL   Nitrite NEGATIVE NEGATIVE   Leukocytes,Ua NEGATIVE NEGATIVE   RBC / HPF 0-5 0 - 5 RBC/hpf   WBC, UA 0-5 0 - 5 WBC/hpf   Bacteria, UA RARE (A) NONE SEEN   Squamous Epithelial / LPF 0-5 0 - 5    Comment: Performed at [facility], [place] [address] [place], [place] [address]  Resp Panel by RT-PCR (Flu A&B, Covid) Anterior Nasal Swab     Status: None   Collection Time: 03/02/22  8:44 PM   Specimen: Anterior Nasal Swab  Result Value Ref Range   SARS Coronavirus 2 by RT PCR NEGATIVE NEGATIVE    Comment: (NOTE) SARS-CoV-2 target nucleic acids are NOT DETECTED.  The SARS-CoV-2 RNA is generally detectable in upper respiratory specimens during the acute phase of infection. The lowest concentration of SARS-CoV-2 viral copies this assay can detect is 138 copies/mL. A negative result does not preclude SARS-Cov-2 infection and should not be used as the sole basis for treatment or other patient management decisions. A negative result may occur with  improper specimen collection/handling, submission of specimen other than nasopharyngeal swab, presence of viral mutation(s) within the areas targeted by this assay, and inadequate number of viral copies(<138 copies/mL). A  negative result must be combined with clinical observations, patient history, and epidemiological information. The expected result is Negative.  Fact Sheet for Patients:  EntrepreneurPulse.com.au  Fact Sheet for Healthcare Providers:  IncredibleEmployment.be  This test is no t yet approved or cleared by the [place] FDA and  has been authorized for detection and/or diagnosis of SARS-CoV-2 by FDA under an Emergency Use Authorization (EUA). This EUA will remain  in effect (meaning this test can be used) for the duration of the COVID-19 declaration under Section 564(b)(1) of the Act, 21 U.S.C.section 360bbb-3(b)(1), unless the authorization is terminated  or revoked sooner.       Influenza A by PCR NEGATIVE NEGATIVE   Influenza B by PCR NEGATIVE NEGATIVE    Comment: (NOTE) The Xpert Xpress SARS-CoV-2/FLU/RSV plus assay is intended as an aid in the diagnosis of influenza from Nasopharyngeal swab  specimens and should not be used as a sole basis for treatment. Nasal washings and aspirates are unacceptable for Xpert Xpress SARS-CoV-2/FLU/RSV testing.  Fact Sheet for Patients: EntrepreneurPulse.com.au  Fact Sheet for Healthcare Providers: IncredibleEmployment.be  This test is not yet approved or cleared by the [place] FDA and has been authorized for detection and/or diagnosis of SARS-CoV-2 by FDA under an Emergency Use Authorization (EUA). This EUA will remain in effect (meaning this test can be used) for the duration of the COVID-19 declaration under Section 564(b)(1) of the Act, 21 U.S.C. section 360bbb-3(b)(1), unless the authorization is terminated or revoked.  Performed at [facility], [place] [address], [place], [place] [address]   Lipase, blood     Status: None   Collection Time: 03/02/22  9:19 PM  Result Value Ref Range   Lipase 24 11 - 51 U/L    Comment: Performed at [facility], [place] [address], [place], [place] [address]  Comprehensive metabolic panel     Status: Abnormal   Collection Time: 03/02/22  9:19 PM  Result Value Ref Range   Sodium 138 135 - 145 mmol/L   Potassium 3.8 3.5 - 5.1 mmol/L   Chloride 105 98 - 111 mmol/L   CO2 22 22 - 32 mmol/L   Glucose, Bld 117 (H) 70 - 99 mg/dL    Comment: Glucose reference range applies only to samples taken after fasting for at least 8 hours.   BUN 19 8 - 23 mg/dL   Creatinine, Ser 1.44 (H) 0.44 - 1.00 mg/dL   Calcium 9.2 8.9 - 10.3 mg/dL   Total Protein 8.1 6.5 - 8.1 g/dL   Albumin 4.1 3.5 - 5.0 g/dL   AST 27 15 - 41 U/L   ALT 19 0 - 44 U/L   Alkaline Phosphatase 120 38 - 126 U/L   Total Bilirubin 1.4 (H) 0.3 - 1.2 mg/dL   GFR, Estimated 38 (L) >60 mL/min    Comment: (NOTE) Calculated using the CKD-EPI Creatinine Equation (2021)    Anion gap 11 5 - 15    Comment: Performed at [facility], [place] [address],  [place], [place] [address]  CBC     Status: Abnormal   Collection Time: 03/02/22  9:19 PM  Result Value Ref Range   WBC 15.8 (H) 4.0 - 10.5 K/uL   RBC 4.48 3.87 - 5.11 MIL/uL   Hemoglobin 12.1 12.0 - 15.0 g/dL   HCT 37.0 36.0 - 46.0 %   MCV 82.6 80.0 - 100.0 fL   MCH 27.0 26.0 - 34.0 pg   MCHC 32.7 30.0 -  36.0 g/dL   RDW 12.9 11.5 - 15.5 %   Platelets 279 150 - 400 K/uL   nRBC 0.0 0.0 - 0.2 %    Comment: Performed at [facility], [place] [address] [place], [place] [address]  Lactic acid, plasma     Status: None   Collection Time: 03/02/22  9:19 PM  Result Value Ref Range   Lactic Acid, Venous 1.7 0.5 - 1.9 mmol/L    Comment: Performed at [facility], [place] [address] [place], [place] [address]  Protime-INR     Status: None   Collection Time: 03/02/22  9:19 PM  Result Value Ref Range   Prothrombin Time 14.3 11.4 - 15.2 seconds   INR 1.1 0.8 - 1.2    Comment: (NOTE) INR goal varies based on device and disease states. Performed at [facility], [place] [address], [place], [place] [address]   APTT     Status: None   Collection Time: 03/02/22  9:19 PM  Result Value Ref Range   aPTT 29 24 - 36 seconds    Comment: Performed at [facility], [place] [address] [place], [place] [address]  Blood Culture (routine x 2)     Status: None (Preliminary result)   Collection Time: 03/02/22  9:21 PM   Specimen: BLOOD  Result Value Ref Range   Specimen Description      BLOOD RIGHT ANTECUBITAL Performed at [facility], [place] [address], [place], [place] [address]    Special Requests      BOTTLES DRAWN AEROBIC AND ANAEROBIC Blood Culture results may not be optimal due to an excessive volume of blood received in culture bottles Performed at [place] [address] [place], [place] [address]    Culture      NO GROWTH < 12 HOURS Performed at [place] [address],  [place], [place] [address]    Report Status PENDING   Blood Culture (routine x 2)     Status: None (Preliminary result)   Collection Time: 03/02/22  9:29 PM   Specimen: BLOOD  Result Value Ref Range   Specimen Description      BLOOD LEFT ANTECUBITAL Performed at [place] [address], [place], [place] [address]    Special Requests      BOTTLES DRAWN AEROBIC AND ANAEROBIC Blood Culture results may not be optimal due to an excessive volume of blood received in culture bottles Performed at [place] [address] [place], [place] [address]    Culture      NO GROWTH < 12 HOURS Performed at [place] [address], [place],  [address]    Report Status PENDING   Lactic acid, plasma     Status: Abnormal   Collection Time: 03/02/22 10:44 PM  Result Value Ref Range   Lactic Acid, Venous 2.3 (HH) 0.5 - 1.9 mmol/L    Comment: CRITICAL RESULT CALLED TO, READ BACK BY AND VERIFIED WITH SMITH,A AT 0001 ON 03/03/22 BY VAZQUEZ,J Performed at [facility], [place] [address] [place], [place] [address]   Lactic acid, plasma     Status: None   Collection Time: 03/03/22 12:48 AM  Result Value Ref Range   Lactic Acid, Venous 1.9 0.5 - 1.9 mmol/L    Comment: RESULTS VERIFIED BY REPEAT TESTING Performed at [place] [address], [place],  [address]  Basic metabolic panel     Status: Abnormal   Collection Time: 03/03/22  6:27 AM  Result Value Ref Range   Sodium 141 135 - 145 mmol/L   Potassium 4.0 3.5 - 5.1 mmol/L   Chloride 107 98 - 111 mmol/L   CO2 26 22 - 32 mmol/L   Glucose, Bld 102 (H) 70 - 99 mg/dL    Comment: Glucose reference range applies only to samples taken after fasting for at least 8 hours.   BUN 16 8 - 23 mg/dL   Creatinine, Ser 0.90 0.44 - 1.00 mg/dL   Calcium 8.6 (L) 8.9 - 10.3 mg/dL   GFR, Estimated >60 >60 mL/min    Comment: (NOTE) Calculated using the CKD-EPI  Creatinine Equation (2021)    Anion gap 8 5 - 15    Comment: Performed at [facility], [place] [address] [place], [place] [address]  CBC     Status: Abnormal   Collection Time: 03/03/22  6:27 AM  Result Value Ref Range   WBC 15.6 (H) 4.0 - 10.5 K/uL   RBC 3.75 (L) 3.87 - 5.11 MIL/uL   Hemoglobin 10.1 (L) 12.0 - 15.0 g/dL   HCT 31.4 (L) 36.0 - 46.0 %   MCV 83.7 80.0 - 100.0 fL   MCH 26.9 26.0 - 34.0 pg   MCHC 32.2 30.0 - 36.0 g/dL   RDW 12.9 11.5 - 15.5 %   Platelets 211 150 - 400 K/uL   nRBC 0.0 0.0 - 0.2 %    Comment: Performed at [facility], [place] [address], [place], [place] [address]  Magnesium     Status: None   Collection Time: 03/03/22  6:27 AM  Result Value Ref Range   Magnesium 1.9 1.7 - 2.4 mg/dL    Comment: Performed at [facility], [place] [address], [place], [place] [address]   CT ABDOMEN PELVIS W CONTRAST  Result Date: 03/03/2022 CLINICAL DATA:  Left lower quadrant abdominal pain. EXAM: CT ABDOMEN AND PELVIS WITH CONTRAST TECHNIQUE: Multidetector CT imaging of the abdomen and pelvis was performed using the standard protocol following bolus administration of intravenous contrast. RADIATION DOSE REDUCTION: This exam was performed according to the departmental dose-optimization program which includes automated exposure control, adjustment of the mA and/or kV according to patient size and/or use of iterative reconstruction technique. CONTRAST:  121m OMNIPAQUE IOHEXOL 300 MG/ML  SOLN COMPARISON:  None Available. FINDINGS: Lower chest: No acute abnormality. Hepatobiliary: No focal liver abnormality is seen. No gallstones, gallbladder wall thickening, or biliary dilatation. Pancreas: Unremarkable. No pancreatic ductal dilatation or surrounding inflammatory changes. Spleen: Normal in size without focal abnormality. Adrenals/Urinary Tract: There is a 3.6 cm right renal cyst. There is a 1.8 cm left renal cyst. Otherwise, the adrenal  glands, kidneys and bladder are within normal limits. Stomach/Bowel: The appendix is dilated and fluid-filled measuring 15 mm in diameter. There is surrounding inflammatory stranding. There is no evidence for perforation or abscess. There is also wall thickening and inflammation involving the adjacent ileal loops in the pelvis and right lower quadrant. There is no pneumatosis or bowel obstruction. There is colonic diverticulosis. Stomach is within normal limits. Vascular/Lymphatic: No significant vascular findings are present. There is an enlarged right lower quadrant lymph node in the mesentery measuring 15 mm short axis. Reproductive: Status post hysterectomy. No adnexal masses. Other: Small amount of free fluid in the pelvis. Small fat containing umbilical hernia. Musculoskeletal: The bones are osteopenic. Degenerative changes affect the spine.  IMPRESSION: 1. Acute appendicitis.  No evidence for perforation or abscess. 2. Adjacent ileitis, likely reactive. 3. Small amount of free fluid in the pelvis. 4. Enlarged right lower quadrant lymph node, likely reactive. Electronically Signed   By: Ronney Asters M.D.   On: 03/03/2022 00:00   DG Chest 2 View  Result Date: 03/02/2022 CLINICAL DATA:  Questionable sepsis. EXAM: CHEST - 2 VIEW COMPARISON:  Chest x-ray 02/10/2005 FINDINGS: The heart size and mediastinal contours are within normal limits. Both lungs are clear. The visualized skeletal structures are unremarkable. IMPRESSION: No active cardiopulmonary disease. Electronically Signed   By: Ronney Asters M.D.   On: 03/02/2022 21:19    Anti-infectives (From admission, onward)    Start     Dose/Rate Route Frequency Ordered Stop   03/03/22 2045  cefTRIAXone (ROCEPHIN) 2 g in sodium chloride 0.9 % 100 mL IVPB  Status:  Discontinued        2 g 200 mL/hr over 30 Minutes Intravenous Every 24 hours 03/03/22 0049 03/03/22 0749   03/03/22 1400  [MAR Hold]  piperacillin-tazobactam (ZOSYN) IVPB 3.375 g        (MAR  Hold since Wed 03/03/2022 at 0850.Hold Reason: Transfer to a Procedural area)   3.375 g 12.5 mL/hr over 240 Minutes Intravenous Every 8 hours 03/03/22 0749     03/03/22 0845  metroNIDAZOLE (FLAGYL) IVPB 500 mg  Status:  Discontinued        500 mg 100 mL/hr over 60 Minutes Intravenous Every 12 hours 03/03/22 0049 03/03/22 0749   03/02/22 2045  cefTRIAXone (ROCEPHIN) 2 g in sodium chloride 0.9 % 100 mL IVPB        2 g 200 mL/hr over 30 Minutes Intravenous  Once 03/02/22 2044 03/02/22 2210   03/02/22 2045  metroNIDAZOLE (FLAGYL) IVPB 500 mg        500 mg 100 mL/hr over 60 Minutes Intravenous  Once 03/02/22 2044 03/02/22 2312        Assessment/Plan Acute appendicitis - Patient with clinical and radiographic findings consistent with acute appendicitis. CT shows a dilated and fluid filled appendix with significant surrounding inflammatory changes, but no evidence of perforation or abscess. She is tachycardic and febrile. Recommend laparoscopic appendectomy. I have discussed the procedure and risks of appendectomy. The risks include but are not limited to bleeding, infection, wound problems, anesthesia, injury to intra-abdominal organs, possibility of open procedure or ileocecectomy, and possibility of postoperative ileus. She seems to understand and agrees with the plan. Will change antibiotics to zosyn. Keep NPO. Plan for OR later this morning.   ID - rocephin/flagyl x1, zosyn 7/12>> VTE - SCDs only for now for procedure FEN - IVF, NPO Foley - none  AKI HTN Hx Breast cancer  I reviewed ED provider notes, hospitalist notes, last 24 h vitals and pain scores, last 48 h intake and output, last 24 h labs and trends, and last 24 h imaging results   Wellington Hampshire, [facility] Surgery 03/03/2022, 8:47 AM Please see Amion for pager number during day hours 7:00am-4:30pm

## 2022-03-03 NOTE — Anesthesia Procedure Notes (Signed)
Procedure Name: Intubation Date/Time: 03/03/2022 10:20 AM  Performed by: Lavina Hamman, CRNAPre-anesthesia Checklist: Patient identified, Emergency Drugs available, Suction available, Patient being monitored and Timeout performed Patient Re-evaluated:Patient Re-evaluated prior to induction Oxygen Delivery Method: Circle system utilized Preoxygenation: Pre-oxygenation with 100% oxygen Induction Type: IV induction Ventilation: Mask ventilation without difficulty Laryngoscope Size: Mac and 3 Grade View: Grade I Tube type: Oral Tube size: 7.0 mm Number of attempts: 1 Airway Equipment and Method: Stylet Placement Confirmation: ETT inserted through vocal cords under direct vision, positive ETCO2, CO2 detector and breath sounds checked- equal and bilateral Secured at: 21 cm Tube secured with: Tape Dental Injury: Teeth and Oropharynx as per pre-operative assessment  Comments: ATOI

## 2022-03-03 NOTE — ED Provider Notes (Incomplete)
[place] DEPT Provider Note   CSN: [id number] Arrival date & time: 03/02/22  2010     History {Add pertinent medical, surgical, social history, OB history to HPI:1} Chief Complaint  Patient presents with  . Abdominal Pain    Christina [name] is a 76 y.o. female.  HPI     Home Medications Prior to Admission medications   Medication Sig Start Date End Date Taking? Authorizing Provider  amLODipine (NORVASC) 10 MG tablet Take 1 tablet by mouth daily 07/28/21     amLODipine (NORVASC) 10 MG tablet Take 1 tablet by mouth daily (dose change). 07/28/21   Park Meo T, PA-C  amLODipine (NORVASC) 5 MG tablet Take 1 tablet (5 mg total) by mouth daily. 06/03/21     celecoxib (CELEBREX) 200 MG capsule Take 1 capsule (200 mg total) by mouth 2 times daily as needed for pain. 06/19/21     cholecalciferol (VITAMIN D3) 25 MCG (1000 UNIT) tablet Take 1 tablet (1,000 Units total) by mouth daily. 01/12/21   Magrinat, Virgie Dad, MD  fexofenadine (ALLEGRA) 60 MG tablet Take 1 tablet by mouth 2 times daily. 12/02/21   Raspet, Derry Skill, PA-C  olmesartan (BENICAR) 20 MG tablet Take 1 tablet (20 mg total) by mouth daily. 08/21/21     olopatadine (PATADAY) 0.1 % ophthalmic solution Place 1 drop into both eyes 2 times daily. 12/02/21   Raspet, Derry Skill, PA-C  predniSONE (DELTASONE) 10 MG tablet Take 6 tablets on day 1 then decrease by 1 tablet each day until finished (6-5-4-3-2-1) 03/12/21     simvastatin (ZOCOR) 10 MG tablet TAKE 1 TABLET BY MOUTH AT BEDTIME 10/20/20 10/20/21  Park Meo T, PA-C  losartan-hydrochlorothiazide (HYZAAR) 100-25 MG tablet Take 1 tablet by mouth daily. 12/31/20 06/03/21        Allergies    Onion, Aspirin, and Hydrocodone    Review of Systems   Review of Systems  Physical Exam Updated Vital Signs BP (!) 155/71   Pulse 92   Temp (!) 101.5 F (38.6 C) (Oral)   Resp (!) 26   Ht '5\' 6"'$  (1.676 m)   Wt 83.9 kg   SpO2 96%   BMI 29.86 kg/m   Physical Exam  ED Results / Procedures / Treatments   Labs (all labs ordered are listed, but only abnormal results are displayed) Labs Reviewed  COMPREHENSIVE METABOLIC PANEL - Abnormal; Notable for the following components:      Result Value   Glucose, Bld 117 (*)    Creatinine, Ser 1.44 (*)    Total Bilirubin 1.4 (*)    GFR, Estimated 38 (*)    All other components within normal limits  CBC - Abnormal; Notable for the following components:   WBC 15.8 (*)    All other components within normal limits  URINALYSIS, ROUTINE W REFLEX MICROSCOPIC - Abnormal; Notable for the following components:   Specific Gravity, Urine 1.004 (*)    Hgb urine dipstick SMALL (*)    Bacteria, UA RARE (*)    All other components within normal limits  RESP PANEL BY RT-PCR (FLU A&B, COVID) ARPGX2  CULTURE, BLOOD (ROUTINE X 2)  CULTURE, BLOOD (ROUTINE X 2)  URINE CULTURE  LIPASE, BLOOD  LACTIC ACID, PLASMA  PROTIME-INR  APTT  LACTIC ACID, PLASMA    EKG None  Radiology DG Chest 2 View  Result Date: 03/02/2022 CLINICAL DATA:  Questionable sepsis. EXAM: CHEST - 2 VIEW COMPARISON:  Chest x-ray 02/10/2005 FINDINGS: The heart  size and mediastinal contours are within normal limits. Both lungs are clear. The visualized skeletal structures are unremarkable. IMPRESSION: No active cardiopulmonary disease. Electronically Signed   By: Ronney Asters M.D.   On: 03/02/2022 21:19    Procedures Procedures  {Document cardiac monitor, telemetry assessment procedure when appropriate:1}  Medications Ordered in ED Medications  lactated ringers infusion ( Intravenous New Bag/Given 03/02/22 2349)  fentaNYL (SUBLIMAZE) injection 50 mcg (has no administration in time range)  acetaminophen (TYLENOL) tablet 650 mg (650 mg Oral Given 03/02/22 2039)  lactated ringers bolus 1,000 mL (0 mLs Intravenous Stopped 03/02/22 2302)    And  lactated ringers bolus 1,000 mL (0 mLs Intravenous Stopped 03/02/22 2213)    And  lactated  ringers bolus 1,000 mL (0 mLs Intravenous Stopped 03/02/22 2314)  cefTRIAXone (ROCEPHIN) 2 g in sodium chloride 0.9 % 100 mL IVPB (0 g Intravenous Stopped 03/02/22 2210)  metroNIDAZOLE (FLAGYL) IVPB 500 mg (0 mg Intravenous Stopped 03/02/22 2312)  iohexol (OMNIPAQUE) 300 MG/ML solution 100 mL (100 mLs Intravenous Contrast Given 03/02/22 2334)    ED Course/ Medical Decision Making/ A&P                           Medical Decision Making Amount and/or Complexity of Data Reviewed Labs: ordered.  Risk OTC drugs. Prescription drug management.   ***  {Document critical care time when appropriate:1} {Document review of labs and clinical decision tools ie heart score, Chads2Vasc2 etc:1}  {Document your independent review of radiology images, and any outside records:1} {Document your discussion with family members, caretakers, and with consultants:1} {Document social determinants of health affecting pt's care:1} {Document your decision making why or why not admission, treatments were needed:1} Final Clinical Impression(s) / ED Diagnoses Final diagnoses:  None    Rx / DC Orders ED Discharge Orders     None

## 2022-03-04 ENCOUNTER — Encounter (HOSPITAL_COMMUNITY): Payer: Self-pay | Admitting: Surgery

## 2022-03-04 DIAGNOSIS — I10 Essential (primary) hypertension: Secondary | ICD-10-CM | POA: Diagnosis not present

## 2022-03-04 DIAGNOSIS — K353 Acute appendicitis with localized peritonitis, without perforation or gangrene: Secondary | ICD-10-CM | POA: Diagnosis not present

## 2022-03-04 LAB — BASIC METABOLIC PANEL
Anion gap: 8 (ref 5–15)
BUN: 20 mg/dL (ref 8–23)
CO2: 24 mmol/L (ref 22–32)
Calcium: 8.4 mg/dL — ABNORMAL LOW (ref 8.9–10.3)
Chloride: 106 mmol/L (ref 98–111)
Creatinine, Ser: 0.88 mg/dL (ref 0.44–1.00)
GFR, Estimated: >60 mL/min (ref >60)
Glucose, Bld: 122 mg/dL — ABNORMAL HIGH (ref 70–99)
Potassium: 4 mmol/L (ref 3.5–5.1)
Sodium: 138 mmol/L (ref 135–145)

## 2022-03-04 LAB — CBC
HCT: 28.6 % — ABNORMAL LOW (ref 36.0–46.0)
Hemoglobin: 9.2 g/dL — ABNORMAL LOW (ref 12.0–15.0)
MCH: 27.1 pg (ref 26.0–34.0)
MCHC: 32.2 g/dL (ref 30.0–36.0)
MCV: 84.1 fL (ref 80.0–100.0)
Platelets: 219 10*3/uL (ref 150–400)
RBC: 3.4 MIL/uL — ABNORMAL LOW (ref 3.87–5.11)
RDW: 13.2 % (ref 11.5–15.5)
WBC: 16.3 10*3/uL — ABNORMAL HIGH (ref 4.0–10.5)
nRBC: 0 % (ref 0.0–0.2)

## 2022-03-04 LAB — SURGICAL PATHOLOGY

## 2022-03-04 MED ORDER — ACETAMINOPHEN 650 MG RE SUPP
650.0000 mg | Freq: Four times a day (QID) | RECTAL | Status: DC
  Filled 2022-03-04: qty 1

## 2022-03-04 MED ORDER — ACETAMINOPHEN 325 MG PO TABS
650.0000 mg | ORAL_TABLET | Freq: Four times a day (QID) | ORAL | Status: DC
  Administered 2022-03-04 – 2022-03-06 (×9): 650 mg via ORAL
  Filled 2022-03-04 (×9): qty 2

## 2022-03-04 MED ORDER — DOCUSATE SODIUM 100 MG PO CAPS
100.0000 mg | ORAL_CAPSULE | Freq: Two times a day (BID) | ORAL | Status: DC
  Administered 2022-03-04 – 2022-03-06 (×5): 100 mg via ORAL
  Filled 2022-03-04 (×5): qty 1

## 2022-03-04 MED ORDER — BOOST / RESOURCE BREEZE PO LIQD CUSTOM
1.0000 | Freq: Three times a day (TID) | ORAL | Status: DC
  Administered 2022-03-04 – 2022-03-05 (×6): 1 via ORAL

## 2022-03-04 NOTE — Progress Notes (Signed)
  Transition of Care (TOC) Screening Note   Patient Details  Name: [name] Date of Birth: Jun 26, 1946   Transition of Care [facility]) CM/SW Contact:    Vassie Moselle, LCSW Phone Number: 03/04/2022, 8:34 AM    Transition of Care Department [facility] has reviewed patient and no TOC needs have been identified at this time. We will continue to monitor patient advancement through interdisciplinary progression rounds. If new patient transition needs arise, please place a TOC consult.

## 2022-03-04 NOTE — Plan of Care (Signed)
  Problem: Education: Goal: Knowledge of General Education information will improve Description Including pain rating scale, medication(s)/side effects and non-pharmacologic comfort measures Outcome: Progressing   

## 2022-03-04 NOTE — Progress Notes (Signed)
Patient ID: Christina [name], female   DOB: 08-21-1946, 76 y.o.   MRN: [id number] [facility] Surgery Progress Note  1 Day Post-Op  Subjective: CC-  Abdomen sore but pain fairly well controlled. Has already been OOB this morning. Does not feel hungry but denies n/v. Tolerating clears. No flatus or BM.  Objective: Vital signs in last 24 hours: Temp:  [98.2 F (36.8 C)-99 F (37.2 C)] 98.7 F (37.1 C) (07/13 0441) Pulse Rate:  [73-87] 73 (07/13 0441) Resp:  [12-18] 18 (07/13 0441) BP: (91-127)/(56-72) 114/67 (07/13 0441) SpO2:  [88 %-100 %] 95 % (07/13 0441) Last BM Date : 03/02/22  Intake/Output from previous day: 07/12 0701 - 07/13 0700 In: 1672.9 [P.O.:708; I.V.:950; IV Piggyback:14.9] Out: 125 [Urine:100; Blood:25] Intake/Output this shift: Total I/O In: 240 [P.O.:240] Out: -   PE: Gen:  Alert, NAD, pleasant Abd: soft, mild distension, +BS, appropriately tender, lap incision cdi with some ecchymosis noted at periumbilical incision  Lab Results:  Recent Labs    03/03/22 0627 03/04/22 0543  WBC 15.6* 16.3*  HGB 10.1* 9.2*  HCT 31.4* 28.6*  PLT 211 219   BMET Recent Labs    03/03/22 0627 03/04/22 0543  NA 141 138  K 4.0 4.0  CL 107 106  CO2 26 24  GLUCOSE 102* 122*  BUN 16 20  CREATININE 0.90 0.88  CALCIUM 8.6* 8.4*   PT/INR Recent Labs    03/02/22 2119  LABPROT 14.3  INR 1.1   CMP     Component Value Date/Time   NA 138 03/04/2022 0543   NA 143 06/28/2018 1550   NA 143 09/23/2016 1426   K 4.0 03/04/2022 0543   K 4.5 09/23/2016 1426   CL 106 03/04/2022 0543   CO2 24 03/04/2022 0543   CO2 29 09/23/2016 1426   GLUCOSE 122 (H) 03/04/2022 0543   GLUCOSE 95 09/23/2016 1426   BUN 20 03/04/2022 0543   BUN 21 06/28/2018 1550   BUN 21.8 09/23/2016 1426   CREATININE 0.88 03/04/2022 0543   CREATININE 1.00 10/17/2019 1107   CREATININE 1.1 09/23/2016 1426   CALCIUM 8.4 (L) 03/04/2022 0543   CALCIUM 10.0 09/23/2016 1426   PROT 8.1 03/02/2022  2119   PROT 6.8 06/28/2018 1550   PROT 7.4 09/23/2016 1426   ALBUMIN 4.1 03/02/2022 2119   ALBUMIN 4.3 06/28/2018 1550   ALBUMIN 4.0 09/23/2016 1426   AST 27 03/02/2022 2119   AST 19 10/17/2019 1107   AST 23 09/23/2016 1426   ALT 19 03/02/2022 2119   ALT 16 10/17/2019 1107   ALT 19 09/23/2016 1426   ALKPHOS 120 03/02/2022 2119   ALKPHOS 151 (H) 09/23/2016 1426   BILITOT 1.4 (H) 03/02/2022 2119   BILITOT 0.6 10/17/2019 1107   BILITOT 0.58 09/23/2016 1426   GFRNONAA >60 03/04/2022 0543   GFRNONAA 57 (L) 10/17/2019 1107   GFRAA >60 10/17/2019 1107   Lipase     Component Value Date/Time   LIPASE 24 03/02/2022 2119       Studies/Results: CT ABDOMEN PELVIS W CONTRAST  Result Date: 03/03/2022 CLINICAL DATA:  Left lower quadrant abdominal pain. EXAM: CT ABDOMEN AND PELVIS WITH CONTRAST TECHNIQUE: Multidetector CT imaging of the abdomen and pelvis was performed using the standard protocol following bolus administration of intravenous contrast. RADIATION DOSE REDUCTION: This exam was performed according to the departmental dose-optimization program which includes automated exposure control, adjustment of the mA and/or kV according to patient size and/or use of iterative reconstruction technique.  CONTRAST:  145m OMNIPAQUE IOHEXOL 300 MG/ML  SOLN COMPARISON:  None Available. FINDINGS: Lower chest: No acute abnormality. Hepatobiliary: No focal liver abnormality is seen. No gallstones, gallbladder wall thickening, or biliary dilatation. Pancreas: Unremarkable. No pancreatic ductal dilatation or surrounding inflammatory changes. Spleen: Normal in size without focal abnormality. Adrenals/Urinary Tract: There is a 3.6 cm right renal cyst. There is a 1.8 cm left renal cyst. Otherwise, the adrenal glands, kidneys and bladder are within normal limits. Stomach/Bowel: The appendix is dilated and fluid-filled measuring 15 mm in diameter. There is surrounding inflammatory stranding. There is no evidence  for perforation or abscess. There is also wall thickening and inflammation involving the adjacent ileal loops in the pelvis and right lower quadrant. There is no pneumatosis or bowel obstruction. There is colonic diverticulosis. Stomach is within normal limits. Vascular/Lymphatic: No significant vascular findings are present. There is an enlarged right lower quadrant lymph node in the mesentery measuring 15 mm short axis. Reproductive: Status post hysterectomy. No adnexal masses. Other: Small amount of free fluid in the pelvis. Small fat containing umbilical hernia. Musculoskeletal: The bones are osteopenic. Degenerative changes affect the spine. IMPRESSION: 1. Acute appendicitis.  No evidence for perforation or abscess. 2. Adjacent ileitis, likely reactive. 3. Small amount of free fluid in the pelvis. 4. Enlarged right lower quadrant lymph node, likely reactive. Electronically Signed   By: ARonney AstersM.D.   On: 03/03/2022 00:00   DG Chest 2 View  Result Date: 03/02/2022 CLINICAL DATA:  Questionable sepsis. EXAM: CHEST - 2 VIEW COMPARISON:  Chest x-ray 02/10/2005 FINDINGS: The heart size and mediastinal contours are within normal limits. Both lungs are clear. The visualized skeletal structures are unremarkable. IMPRESSION: No active cardiopulmonary disease. Electronically Signed   By: ARonney AstersM.D.   On: 03/02/2022 21:19    Anti-infectives: Anti-infectives (From admission, onward)    Start     Dose/Rate Route Frequency Ordered Stop   03/04/22 0034  piperacillin-tazobactam (ZOSYN) IVPB 3.375 g        3.375 g 12.5 mL/hr over 240 Minutes Intravenous Every 8 hours 03/03/22 1638     03/03/22 2045  cefTRIAXone (ROCEPHIN) 2 g in sodium chloride 0.9 % 100 mL IVPB  Status:  Discontinued        2 g 200 mL/hr over 30 Minutes Intravenous Every 24 hours 03/03/22 0049 03/03/22 0749   03/03/22 1400  piperacillin-tazobactam (ZOSYN) IVPB 3.375 g  Status:  Discontinued        3.375 g 12.5 mL/hr over 240  Minutes Intravenous Every 8 hours 03/03/22 0749 03/03/22 1638   03/03/22 0845  metroNIDAZOLE (FLAGYL) IVPB 500 mg  Status:  Discontinued        500 mg 100 mL/hr over 60 Minutes Intravenous Every 12 hours 03/03/22 0049 03/03/22 0749   03/02/22 2045  cefTRIAXone (ROCEPHIN) 2 g in sodium chloride 0.9 % 100 mL IVPB        2 g 200 mL/hr over 30 Minutes Intravenous  Once 03/02/22 2044 03/02/22 2210   03/02/22 2045  metroNIDAZOLE (FLAGYL) IVPB 500 mg        500 mg 100 mL/hr over 60 Minutes Intravenous  Once 03/02/22 2044 03/02/22 2312        Assessment/Plan Acute appendicitis with perforation and peritonitis without abscess POD#1 s/p laparoscopic appendectomy 7/12 Dr. CBrantley Stage - Schedule tylenol. Ok for fulls but would not advance further until she has return in bowel function. Add colace. Mobilize.  Continue IV antibiotics, plan for 7 days  total abx postop.  ID - rocephin/flagyl x1, zosyn 7/12>> VTE - SCDs only for now for procedure FEN - IVF, NPO Foley - none   AKI - resolved HTN Hx Breast cancer    LOS: 1 day    Wellington Hampshire, [facility] Surgery 03/04/2022, 9:35 AM Please see Amion for pager number during day hours 7:00am-4:30pm

## 2022-03-04 NOTE — Progress Notes (Signed)
  Progress Note   Patient: Christina [name] KDX:[id number] DOB: [birth year] DOA: 03/02/2022     1 DOS: the patient was seen and examined on 03/04/2022   Brief hospital course: 76 y.o. female with medical history significant for hypertension and history of breast cancer, now presenting to the emergency department with abdominal pain, nausea, and vomiting.  Patient reports that she was feeling constipated for several days, eating raisins to try to relieve this, may have had some mild nausea and abdominal pain as early as 02/27/2022, but pain and nausea became severe early a.m. of 03/02/2022.  Pain is primarily in the RLQ. Pt was found to have evidence of acute appendicitis on CT  Assessment and Plan: 1. Acute appendicitis  - Presents with RLQ pain and N/V    - Blood cultures were collected in ED, 30 mL/kg LR bolus was given, and she was started on Rocephin and Flagyl, now on zosyn - General Surgery following and pt is now s/p surgery 7/12 -Continued on full liquid diet pending return of bowel function   2. AKI  - SCr is 1.49 on admission, up from 0.9 in December 2022  - No hydronephrosis on CT in ED  - Likely acute prerenal azotemia in setting of sepsis and N/V  - Cr normalized with IVF   3. Prolonged QT interval  - QTc is 513 ms in ED  - Electrolytes corrected -Recheck bmet in AM   4. HTN  - BP stable and controlled at this time       Subjective: No flatus or BM this AM. Denies abd pain currently  Physical Exam: Vitals:   03/03/22 1455 03/03/22 2014 03/04/22 0441 03/04/22 1406  BP:  127/67 114/67 113/60  Pulse:  83 73 83  Resp:  '18 18 17  '$ Temp:  98.2 F (36.8 C) 98.7 F (37.1 C) 98.5 F (36.9 C)  TempSrc:  Oral Oral Oral  SpO2: (!) 88% 100% 95% 95%  Weight:      Height:       General exam: Conversant, in no acute distress Respiratory system: normal chest rise, clear, no audible wheezing Cardiovascular system: regular rhythm, s1-s2 Gastrointestinal system: Nondistended,  nontender, pos BS Central nervous system: No seizures, no tremors Extremities: No cyanosis, no joint deformities Skin: No rashes, no pallor Psychiatry: Affect normal // no auditory hallucinations   Data Reviewed:  Labs reviewed: Na 138, Cr 0.88, WBC 16.3   Family Communication: Pt in room, family not at bedside  Disposition: Status is: Inpatient Remains inpatient appropriate because: Severity of illness  Planned Discharge Destination: Home    Author: Marylu Lund, MD 03/04/2022 5:34 PM  For on call review [email or web address].

## 2022-03-04 NOTE — Anesthesia Postprocedure Evaluation (Signed)
Anesthesia Post Note  Patient: Christina [name]  Procedure(s) Performed: APPENDECTOMY LAPAROSCOPIC     Patient location during evaluation: PACU Anesthesia Type: General Level of consciousness: awake and alert Pain management: pain level controlled Vital Signs Assessment: post-procedure vital signs reviewed and stable Respiratory status: spontaneous breathing, nonlabored ventilation, respiratory function stable and patient connected to nasal cannula oxygen Cardiovascular status: blood pressure returned to baseline and stable Postop Assessment: no apparent nausea or vomiting Anesthetic complications: no   No notable events documented.  Last Vitals:  Vitals:   03/03/22 2014 03/04/22 0441  BP: 127/67 114/67  Pulse: 83 73  Resp: 18 18  Temp: 36.8 C 37.1 C  SpO2: 100% 95%    Last Pain:  Vitals:   03/04/22 0705  TempSrc:   PainSc: [place]

## 2022-03-05 DIAGNOSIS — K353 Acute appendicitis with localized peritonitis, without perforation or gangrene: Secondary | ICD-10-CM | POA: Diagnosis not present

## 2022-03-05 DIAGNOSIS — I10 Essential (primary) hypertension: Secondary | ICD-10-CM | POA: Diagnosis not present

## 2022-03-05 DIAGNOSIS — N179 Acute kidney failure, unspecified: Secondary | ICD-10-CM | POA: Diagnosis not present

## 2022-03-05 LAB — BASIC METABOLIC PANEL
Anion gap: 7 (ref 5–15)
BUN: 19 mg/dL (ref 8–23)
CO2: 24 mmol/L (ref 22–32)
Calcium: 8.4 mg/dL — ABNORMAL LOW (ref 8.9–10.3)
Chloride: 108 mmol/L (ref 98–111)
Creatinine, Ser: 1.05 mg/dL — ABNORMAL HIGH (ref 0.44–1.00)
GFR, Estimated: 56 mL/min — ABNORMAL LOW (ref >60)
Glucose, Bld: 106 mg/dL — ABNORMAL HIGH (ref 70–99)
Potassium: 3.5 mmol/L (ref 3.5–5.1)
Sodium: 139 mmol/L (ref 135–145)

## 2022-03-05 LAB — CBC
HCT: 28.1 % — ABNORMAL LOW (ref 36.0–46.0)
Hemoglobin: 8.7 g/dL — ABNORMAL LOW (ref 12.0–15.0)
MCH: 26.6 pg (ref 26.0–34.0)
MCHC: 31 g/dL (ref 30.0–36.0)
MCV: 85.9 fL (ref 80.0–100.0)
Platelets: 236 10*3/uL (ref 150–400)
RBC: 3.27 MIL/uL — ABNORMAL LOW (ref 3.87–5.11)
RDW: 13 % (ref 11.5–15.5)
WBC: 13.3 10*3/uL — ABNORMAL HIGH (ref 4.0–10.5)
nRBC: 0 % (ref 0.0–0.2)

## 2022-03-05 NOTE — Plan of Care (Signed)
  Problem: Pain Managment: Goal: General experience of comfort will improve Outcome: Progressing   

## 2022-03-05 NOTE — Progress Notes (Signed)
Patient ID: KANETRA HO, female   DOB: 1946-03-03, 76 y.o.   MRN: [id number] [facility] Surgery Progress Note  2 Days Post-Op  Subjective: CC-  Up in chair. States that she had some RLQ pain last night, oxycodone helped. Abdomen still sore but feeling betrer this morning. Tolerating liquids without n/v. Passing small amount of flatus, no BM. WBC down 13.3, TMAX 99.1  Objective: Vital signs in last 24 hours: Temp:  [97.5 F (36.4 C)-99.1 F (37.3 C)] 99.1 F (37.3 C) (07/14 0511) Pulse Rate:  [83-86] 86 (07/14 0511) Resp:  [16-18] 16 (07/14 0511) BP: (113-127)/(60-70) 122/63 (07/14 0511) SpO2:  [90 %-95 %] 90 % (07/14 0511) Last BM Date : 03/02/22  Intake/Output from previous day: 07/13 0701 - 07/14 0700 In: 840 [P.O.:840] Out: -  Intake/Output this shift: No intake/output data recorded.  PE: Gen:  Alert, NAD, pleasant Abd: soft, mild distension, +BS, appropriately tender, lap incision cdi with some ecchymosis noted at periumbilical incision  Lab Results:  Recent Labs    03/04/22 0543 03/05/22 0548  WBC 16.3* 13.3*  HGB 9.2* 8.7*  HCT 28.6* 28.1*  PLT 219 236   BMET Recent Labs    03/04/22 0543 03/05/22 0548  NA 138 139  K 4.0 3.5  CL 106 108  CO2 24 24  GLUCOSE 122* 106*  BUN 20 19  CREATININE 0.88 1.05*  CALCIUM 8.4* 8.4*   PT/INR Recent Labs    03/02/22 2119  LABPROT 14.3  INR 1.1   CMP     Component Value Date/Time   NA 139 03/05/2022 0548   NA 143 06/28/2018 1550   NA 143 09/23/2016 1426   K 3.5 03/05/2022 0548   K 4.5 09/23/2016 1426   CL 108 03/05/2022 0548   CO2 24 03/05/2022 0548   CO2 29 09/23/2016 1426   GLUCOSE 106 (H) 03/05/2022 0548   GLUCOSE 95 09/23/2016 1426   BUN 19 03/05/2022 0548   BUN 21 06/28/2018 1550   BUN 21.8 09/23/2016 1426   CREATININE 1.05 (H) 03/05/2022 0548   CREATININE 1.00 10/17/2019 1107   CREATININE 1.1 09/23/2016 1426   CALCIUM 8.4 (L) 03/05/2022 0548   CALCIUM 10.0 09/23/2016 1426   PROT  8.1 03/02/2022 2119   PROT 6.8 06/28/2018 1550   PROT 7.4 09/23/2016 1426   ALBUMIN 4.1 03/02/2022 2119   ALBUMIN 4.3 06/28/2018 1550   ALBUMIN 4.0 09/23/2016 1426   AST 27 03/02/2022 2119   AST 19 10/17/2019 1107   AST 23 09/23/2016 1426   ALT 19 03/02/2022 2119   ALT 16 10/17/2019 1107   ALT 19 09/23/2016 1426   ALKPHOS 120 03/02/2022 2119   ALKPHOS 151 (H) 09/23/2016 1426   BILITOT 1.4 (H) 03/02/2022 2119   BILITOT 0.6 10/17/2019 1107   BILITOT 0.58 09/23/2016 1426   GFRNONAA 56 (L) 03/05/2022 0548   GFRNONAA 57 (L) 10/17/2019 1107   GFRAA >60 10/17/2019 1107   Lipase     Component Value Date/Time   LIPASE 24 03/02/2022 2119       Studies/Results: No results found.  Anti-infectives: Anti-infectives (From admission, onward)    Start     Dose/Rate Route Frequency Ordered Stop   03/04/22 0034  piperacillin-tazobactam (ZOSYN) IVPB 3.375 g        3.375 g 12.5 mL/hr over 240 Minutes Intravenous Every 8 hours 03/03/22 1638     03/03/22 2045  cefTRIAXone (ROCEPHIN) 2 g in sodium chloride 0.9 % 100 mL IVPB  Status:  Discontinued        2 g 200 mL/hr over 30 Minutes Intravenous Every 24 hours 03/03/22 0049 03/03/22 0749   03/03/22 1400  piperacillin-tazobactam (ZOSYN) IVPB 3.375 g  Status:  Discontinued        3.375 g 12.5 mL/hr over 240 Minutes Intravenous Every 8 hours 03/03/22 0749 03/03/22 1638   03/03/22 0845  metroNIDAZOLE (FLAGYL) IVPB 500 mg  Status:  Discontinued        500 mg 100 mL/hr over 60 Minutes Intravenous Every 12 hours 03/03/22 0049 03/03/22 0749   03/02/22 2045  cefTRIAXone (ROCEPHIN) 2 g in sodium chloride 0.9 % 100 mL IVPB        2 g 200 mL/hr over 30 Minutes Intravenous  Once 03/02/22 2044 03/02/22 2210   03/02/22 2045  metroNIDAZOLE (FLAGYL) IVPB 500 mg        500 mg 100 mL/hr over 60 Minutes Intravenous  Once 03/02/22 2044 03/02/22 2312        Assessment/Plan Acute appendicitis with perforation and peritonitis without abscess POD#2 s/p  laparoscopic appendectomy 7/12 Dr. Brantley Stage  - Advance to soft diet. Mobilize. Continue IV abx while here, and plan to transition to oral abx upon discharge for a total of 7 days antibiotics postop. Mild bump in creatinine today, WBC down to 13.3. Possibly home tomorrow pending pain and diet tolerance. Discharge instructions and follow up info on AVS. - Path: Low-grade appendiceal mucinous neoplasm (LAMN), Neoplastic changes are present in the epithelium at the appendiceal margin of resection; gangrenous appendicitis with perforation and periappendicitis; there is no neoplastic infiltration of the submucosa and muscularis propria; no mucinous deposits are identified on the appendiceal serosa. >> Reviewed with colorectal surgeon then Discussed path with patient. Will have our office refer to surgical oncology at [facility] to discuss management options.    ID - rocephin/flagyl x1, zosyn 7/12>> VTE - SCDs, ok for chemical dvt ppx from surgical standopint FEN - soft diet Foley - none   AKI - Cr slightly up 1.05 HTN Hx Breast cancer    LOS: 2 days    Wellington Hampshire, [facility] Surgery 03/05/2022, 9:42 AM Please see Amion for pager number during day hours 7:00am-4:30pm

## 2022-03-05 NOTE — Progress Notes (Signed)
  Progress Note   Patient: Christina [name] PZW:[id number] DOB: [birth year] DOA: 03/02/2022     2 DOS: the patient was seen and examined on 03/05/2022   Brief hospital course: 76 y.o. female with medical history significant for hypertension and history of breast cancer, now presenting to the emergency department with abdominal pain, nausea, and vomiting.  Patient reports that she was feeling constipated for several days, eating raisins to try to relieve this, may have had some mild nausea and abdominal pain as early as 02/27/2022, but pain and nausea became severe early a.m. of 03/02/2022.  Pain is primarily in the RLQ. Pt was found to have evidence of acute appendicitis on CT  Assessment and Plan: 1. Acute appendicitis  - Presents with RLQ pain and N/V    - Blood cultures were collected in ED, 30 mL/kg LR bolus was given, and she was started on Rocephin and Flagyl, now on zosyn - General Surgery following and pt is now s/p surgery 7/12 -Diet advanced to soft per GI -Path pos for low grade appendiceal mucinous neoplasm. Pt to be referred to [facility] to discuss management -WBC down to 13.3   2. AKI  - SCr is 1.49 on admission, up from 0.9 in December 2022  - No hydronephrosis on CT in ED  - Likely acute prerenal azotemia in setting of sepsis and N/V  - Cr normalized with IVF   3. Prolonged QT interval  - QTc is 513 ms in ED  - Cont to correct lytes as needed   4. HTN  - BP stable and controlled at this time       Subjective: When seen, reports feeling need to move her bowels  Physical Exam: Vitals:   03/04/22 1406 03/04/22 1944 03/05/22 0511 03/05/22 1236  BP: 113/60 127/70 122/63 (!) 116/56  Pulse: 83 86 86 81  Resp: '17 18 16 18  '$ Temp: 98.5 F (36.9 C) (!) 97.5 F (36.4 C) 99.1 F (37.3 C) 98.7 F (37.1 C)  TempSrc: Oral Oral Oral Oral  SpO2: 95% 92% 90% 93%  Weight:      Height:       General exam: Conversant, in no acute distress Respiratory system: normal chest rise, clear,  no audible wheezing Cardiovascular system: regular rhythm, s1-s2 Gastrointestinal system: Nondistended, nontender, pos BS Central nervous system: No seizures, no tremors Extremities: No cyanosis, no joint deformities Skin: No rashes, no pallor Psychiatry: Affect normal // no auditory hallucinations   Data Reviewed:  Labs reviewed: Na 139, Cr 1.05, WBC 13.3   Family Communication: Pt in room, family not at bedside  Disposition: Status is: Inpatient Remains inpatient appropriate because: Severity of illness  Planned Discharge Destination: Home    Author: Marylu Lund, MD 03/05/2022 5:09 PM  For on call review [email or web address].

## 2022-03-06 ENCOUNTER — Other Ambulatory Visit (HOSPITAL_COMMUNITY): Payer: Self-pay

## 2022-03-06 DIAGNOSIS — N179 Acute kidney failure, unspecified: Secondary | ICD-10-CM | POA: Diagnosis not present

## 2022-03-06 DIAGNOSIS — I10 Essential (primary) hypertension: Secondary | ICD-10-CM | POA: Diagnosis not present

## 2022-03-06 LAB — CBC
HCT: 27 % — ABNORMAL LOW (ref 36.0–46.0)
Hemoglobin: 8.7 g/dL — ABNORMAL LOW (ref 12.0–15.0)
MCH: 27.2 pg (ref 26.0–34.0)
MCHC: 32.2 g/dL (ref 30.0–36.0)
MCV: 84.4 fL (ref 80.0–100.0)
Platelets: 243 10*3/uL (ref 150–400)
RBC: 3.2 MIL/uL — ABNORMAL LOW (ref 3.87–5.11)
RDW: 13.2 % (ref 11.5–15.5)
WBC: 10.5 10*3/uL (ref 4.0–10.5)
nRBC: 0 % (ref 0.0–0.2)

## 2022-03-06 LAB — COMPREHENSIVE METABOLIC PANEL
ALT: 20 U/L (ref 0–44)
AST: 23 U/L (ref 15–41)
Albumin: 2.7 g/dL — ABNORMAL LOW (ref 3.5–5.0)
Alkaline Phosphatase: 116 U/L (ref 38–126)
Anion gap: 7 (ref 5–15)
BUN: 14 mg/dL (ref 8–23)
CO2: 25 mmol/L (ref 22–32)
Calcium: 8.1 mg/dL — ABNORMAL LOW (ref 8.9–10.3)
Chloride: 108 mmol/L (ref 98–111)
Creatinine, Ser: 0.9 mg/dL (ref 0.44–1.00)
GFR, Estimated: >60 mL/min (ref >60)
Glucose, Bld: 98 mg/dL (ref 70–99)
Potassium: 3.4 mmol/L — ABNORMAL LOW (ref 3.5–5.1)
Sodium: 140 mmol/L (ref 135–145)
Total Bilirubin: 0.8 mg/dL (ref 0.3–1.2)
Total Protein: 5.7 g/dL — ABNORMAL LOW (ref 6.5–8.1)

## 2022-03-06 MED ORDER — AMOXICILLIN-POT CLAVULANATE 875-125 MG PO TABS
1.0000 | ORAL_TABLET | Freq: Two times a day (BID) | ORAL | 0 refills | Status: AC
  Filled 2022-03-06: qty 10, 5d supply, fill #0

## 2022-03-06 MED ORDER — OXYCODONE HCL 5 MG PO TABS
5.0000 mg | ORAL_TABLET | Freq: Four times a day (QID) | ORAL | 0 refills | Status: DC | PRN
  Filled 2022-03-06: qty 20, 3d supply, fill #0

## 2022-03-06 MED ORDER — GUAIFENESIN-DM 100-10 MG/5ML PO SYRP
5.0000 mL | ORAL_SOLUTION | ORAL | Status: DC | PRN
  Administered 2022-03-06: 5 mL via ORAL
  Filled 2022-03-06: qty 5

## 2022-03-06 MED ORDER — ORAL CARE MOUTH RINSE: 15.0000 mL | OROMUCOSAL | Status: DC | PRN

## 2022-03-06 NOTE — Progress Notes (Signed)
3 Days Post-Op   Subjective/Chief Complaint: No complaints Tolerating po Had a BM   Objective: Vital signs in last 24 hours: Temp:  [98.7 F (37.1 C)-100.5 F (38.1 C)] 99.3 F (37.4 C) (07/15 0401) Pulse Rate:  [81-94] 94 (07/15 0401) Resp:  [16-18] 18 (07/15 0401) BP: (116-144)/(56-80) 140/79 (07/15 0401) SpO2:  [91 %-93 %] 91 % (07/15 0401) Last BM Date : 03/05/22  Intake/Output from previous day: 07/14 0701 - 07/15 0700 In: 1403.1 [P.O.:1050; I.V.:3; IV Piggyback:350.1] Out: -  Intake/Output this shift: No intake/output data recorded.  Exam: Awake and alert Looks comfortable Abdomen soft, minimally tender  Lab Results:  Recent Labs    03/05/22 0548 03/06/22 0541  WBC 13.3* 10.5  HGB 8.7* 8.7*  HCT 28.1* 27.0*  PLT 236 243   BMET Recent Labs    03/05/22 0548 03/06/22 0541  NA 139 140  K 3.5 3.4*  CL 108 108  CO2 24 25  GLUCOSE 106* 98  BUN 19 14  CREATININE 1.05* 0.90  CALCIUM 8.4* 8.1*   PT/INR No results for input(s): "LABPROT", "INR" in the last 72 hours. ABG No results for input(s): "PHART", "HCO3" in the last 72 hours.  Invalid input(s): "PCO2", "PO2"  Studies/Results: No results found.  Anti-infectives: Anti-infectives (From admission, onward)    Start     Dose/Rate Route Frequency Ordered Stop   03/04/22 0034  piperacillin-tazobactam (ZOSYN) IVPB 3.375 g        3.375 g 12.5 mL/hr over 240 Minutes Intravenous Every 8 hours 03/03/22 1638     03/03/22 2045  cefTRIAXone (ROCEPHIN) 2 g in sodium chloride 0.9 % 100 mL IVPB  Status:  Discontinued        2 g 200 mL/hr over 30 Minutes Intravenous Every 24 hours 03/03/22 0049 03/03/22 0749   03/03/22 1400  piperacillin-tazobactam (ZOSYN) IVPB 3.375 g  Status:  Discontinued        3.375 g 12.5 mL/hr over 240 Minutes Intravenous Every 8 hours 03/03/22 0749 03/03/22 1638   03/03/22 0845  metroNIDAZOLE (FLAGYL) IVPB 500 mg  Status:  Discontinued        500 mg 100 mL/hr over 60 Minutes  Intravenous Every 12 hours 03/03/22 0049 03/03/22 0749   03/02/22 2045  cefTRIAXone (ROCEPHIN) 2 g in sodium chloride 0.9 % 100 mL IVPB        2 g 200 mL/hr over 30 Minutes Intravenous  Once 03/02/22 2044 03/02/22 2210   03/02/22 2045  metroNIDAZOLE (FLAGYL) IVPB 500 mg        500 mg 100 mL/hr over 60 Minutes Intravenous  Once 03/02/22 2044 03/02/22 2312       Assessment/Plan: Acute appendicitis with perforation and peritonitis without abscess POD#3 s/p laparoscopic appendectomy 7/12 Dr. Sofie Rower for discharge home today from a surgical standpoint Arranging follow-up at [facility] 03/06/2022

## 2022-03-06 NOTE — Plan of Care (Signed)
Discussed with patient in front of family plan of care for the evening, pain management and temperature with some teach back displayed.  Patient's family also concerned for her need for a bedside commode and walker at home for support.   Problem: Education: Goal: Knowledge of General Education information will improve Description: Including pain rating scale, medication(s)/side effects and non-pharmacologic comfort measures Outcome: Progressing   Problem: Health Behavior/Discharge Planning: Goal: Ability to manage health-related needs will improve Outcome: Progressing

## 2022-03-06 NOTE — TOC Transition Note (Signed)
Transition of Care [facility]) - CM/SW Discharge Note   Patient Details  Name: Christina [name] MRN: [id number] Date of Birth: 1946-04-23  [facility] CM/SW Contact:  Vassie Moselle, LCSW Phone Number: 03/06/2022, 10:57 AM   Clinical Narrative:    RW and BSC ordered for home use through [place]. DME to be delivered to pt's room prior to discharge. No further TOC needs identified at this time.    Final next level of care: Home/Self Care Barriers to Discharge: No Barriers Identified   Patient Goals and CMS Choice Patient states their goals for this hospitalization and ongoing recovery are:: To return home   Choice offered to / list presented to : Patient  Discharge Placement                       Discharge Plan and Services                DME Arranged: Walker rolling, Bedside commode DME Agency: [employer] Date DME Agency Contacted: 03/06/22 Time DME Agency Contacted: [id number] Representative spoke with at DME Agency: Mary Esther ([place]) Interventions     Readmission Risk Interventions    03/06/2022   10:56 AM  Readmission Risk Prevention Plan  Transportation Screening Complete  PCP or Specialist Appt within 5-7 Days Complete  Home Care Screening Complete  Medication Review (RN CM) Complete

## 2022-03-06 NOTE — Progress Notes (Signed)
Pt being discharged home with daughter. Discharge instructions and medication education provided to pt and daughter at bedside. Pt sent home with [facility] and [facility].

## 2022-03-06 NOTE — Discharge Summary (Signed)
Physician Discharge Summary   Patient: Christina [name] MRN: [id number] DOB: [birth year]  Admit date:     03/02/2022  Discharge date: 03/06/22  Discharge Physician: Marylu Lund   PCP: Andria Frames, PA-C   Recommendations at discharge:    Follow up with PCP in 1-2 weeks Follow up with [facility] as scheduled  Discharge Diagnoses: Principal Problem:   Acute appendicitis Active Problems:   Essential hypertension   AKI (acute kidney injury) ([place])   Prolonged QT interval   Severe sepsis ([place])  Resolved Problems:   * No resolved hospital problems. *  Hospital Course: 76 y.o. female with medical history significant for hypertension and history of breast cancer, now presenting to the emergency department with abdominal pain, nausea, and vomiting.  Patient reports that she was feeling constipated for several days, eating raisins to try to relieve this, may have had some mild nausea and abdominal pain as early as 02/27/2022, but pain and nausea became severe early a.m. of 03/02/2022.  Pain is primarily in the RLQ. Pt was found to have evidence of acute appendicitis on CT  Assessment and Plan: 1. 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 Pt to be referred to [facility] to discuss management -WBC down to 10.5. Pt to complete an additional 5 days of augmentin on d/c to complete over one week of abx   2. AKI  - SCr is 1.49 on admission, up from 0.9 in December 2022  - No hydronephrosis on CT in ED  - Likely acute prerenal azotemia in setting of sepsis and N/V  - Cr normalized with IVF   3. Prolonged QT interval  - QTc is 513 ms in ED  - Cont to correct lytes as needed   4. HTN  - BP remained stable       Consultants:  General Surgery Procedures performed: Appendectomy  Disposition: Home Diet recommendation:  Regular diet DISCHARGE MEDICATION: Allergies as of 03/06/2022       Reactions   Onion Nausea And Vomiting   White onion   Simvastatin Other (See Comments)   myalgia   Aspirin Nausea Only, Nausea And Vomiting   Hydrocodone Rash   With itching        Medication List     TAKE these medications    amLODipine 10 MG tablet Commonly known as: NORVASC Take 1 tablet by mouth daily (dose change). What changed:  how much to take how to take this when to take this   fexofenadine 60 MG tablet Commonly known as: ALLEGRA Take 1 tablet by mouth 2 times daily. What changed:  when to take this reasons to take this   olmesartan 20 MG tablet Commonly known as: BENICAR Take 1 tablet (20 mg total) by mouth daily. What changed: when to take this   olopatadine 0.1 % ophthalmic solution Commonly known as: Pataday Place 1 drop into both eyes 2 times daily. What changed:  when to take this reasons to take this   oxyCODONE 5 MG immediate release tablet Commonly known as: Oxy IR/ROXICODONE Take 1-2 tablets (5-10 mg total) by mouth every 6 (six) hours as needed for moderate pain or severe pain.  Durable Medical Equipment  (From admission, onward)           Start     Ordered   03/06/22 1050  For home use only DME Walker rolling  Once       Question Answer Comment  Walker: With [place] Wheels   Patient needs a walker to treat with the following condition Appendicitis      03/06/22 1049   03/06/22 1049  For home use only DME Bedside commode  Once       Question:  Patient needs a bedside commode to treat with the following condition  Answer:  Acute appendicitis   03/06/22 [facility] Go on 03/25/2022.   Specialty: General Surgery Why: Your appointment is 03/25/22 at 1:45pm Arrive 67mn early to check  in, fill out paperwork, Bring photo ID and insurance information [address] information: [address] [place] [phone]              Discharge Exam: FDanley DankerWeights   03/02/22 2021 03/03/22 0907  Weight: 83.9 kg 83.9 kg   General exam: Awake, laying in bed, in nad Respiratory system: Normal respiratory effort, no wheezing Cardiovascular system: regular rate, s1, s2 Gastrointestinal system: Soft, nondistended, positive BS Central nervous system: CN2-12 grossly intact, strength intact Extremities: Perfused, no clubbing Skin: Normal skin turgor, no notable skin lesions seen Psychiatry: Mood normal // no visual hallucinations    Condition at discharge: fair  The results of significant diagnostics from this hospitalization (including imaging, microbiology, ancillary and laboratory) are listed below for reference.   Imaging Studies: CT ABDOMEN PELVIS W CONTRAST  Result Date: 03/03/2022 CLINICAL DATA:  Left lower quadrant abdominal pain. EXAM: CT ABDOMEN AND PELVIS WITH CONTRAST TECHNIQUE: Multidetector CT imaging of the abdomen and pelvis was performed using the standard protocol following bolus administration of intravenous contrast. RADIATION DOSE REDUCTION: This exam was performed according to the departmental dose-optimization program which includes automated exposure control, adjustment of the mA and/or kV according to patient size and/or use of iterative reconstruction technique. CONTRAST:  1042mOMNIPAQUE IOHEXOL 300 MG/ML  SOLN COMPARISON:  None Available. FINDINGS: Lower chest: No acute abnormality. Hepatobiliary: No focal liver abnormality is seen. No gallstones, gallbladder wall thickening, or biliary dilatation. Pancreas: Unremarkable. No pancreatic ductal dilatation or surrounding inflammatory changes. Spleen: Normal in size without focal abnormality. Adrenals/Urinary Tract: There is a 3.6 cm right renal cyst. There is a 1.8 cm  left renal cyst. Otherwise, the adrenal glands, kidneys and bladder are within normal limits. Stomach/Bowel: The appendix is dilated and fluid-filled measuring 15 mm in diameter. There is surrounding inflammatory stranding. There is no evidence for perforation or abscess. There is also wall thickening and inflammation involving the adjacent ileal loops in the pelvis and right lower quadrant. There is no pneumatosis or bowel obstruction. There is colonic diverticulosis. Stomach is within normal limits. Vascular/Lymphatic: No significant vascular findings are present. There is an enlarged right lower quadrant lymph node in the mesentery measuring 15 mm short axis. Reproductive: Status post hysterectomy. No adnexal masses. Other: Small amount of free fluid in the pelvis. Small fat containing umbilical hernia. Musculoskeletal: The bones are osteopenic. Degenerative changes affect the spine. IMPRESSION: 1. Acute appendicitis.  No evidence for perforation or abscess. 2. Adjacent ileitis, likely reactive. 3. Small amount of free fluid in the pelvis. 4. Enlarged right  lower quadrant lymph node, likely reactive. Electronically Signed   By: Ronney Asters M.D.   On: 03/03/2022 00:00   DG Chest 2 View  Result Date: 03/02/2022 CLINICAL DATA:  Questionable sepsis. EXAM: CHEST - 2 VIEW COMPARISON:  Chest x-ray 02/10/2005 FINDINGS: The heart size and mediastinal contours are within normal limits. Both lungs are clear. The visualized skeletal structures are unremarkable. IMPRESSION: No active cardiopulmonary disease. Electronically Signed   By: Ronney Asters M.D.   On: 03/02/2022 21:19    Microbiology: Results for orders placed or performed during the hospital encounter of 03/02/22  Resp Panel by RT-PCR (Flu A&B, Covid) Anterior Nasal Swab     Status: None   Collection Time: 03/02/22  8:44 PM   Specimen: Anterior Nasal Swab  Result Value Ref Range Status   SARS Coronavirus 2 by RT PCR NEGATIVE NEGATIVE Final    Comment:  (NOTE) SARS-CoV-2 target nucleic acids are NOT DETECTED.  The SARS-CoV-2 RNA is generally detectable in upper respiratory specimens during the acute phase of infection. The lowest concentration of SARS-CoV-2 viral copies this assay can detect is 138 copies/mL. A negative result does not preclude SARS-Cov-2 infection and should not be used as the sole basis for treatment or other patient management decisions. A negative result may occur with  improper specimen collection/handling, submission of specimen other than nasopharyngeal swab, presence of viral mutation(s) within the areas targeted by this assay, and inadequate number of viral copies(<138 copies/mL). A negative result must be combined with clinical observations, patient history, and epidemiological information. The expected result is Negative.  Fact Sheet for Patients:  EntrepreneurPulse.com.au  Fact Sheet for Healthcare Providers:  IncredibleEmployment.be  This test is no t yet approved or cleared by the [place] FDA and  has been authorized for detection and/or diagnosis of SARS-CoV-2 by FDA under an Emergency Use Authorization (EUA). This EUA will remain  in effect (meaning this test can be used) for the duration of the COVID-19 declaration under Section 564(b)(1) of the Act, 21 U.S.C.section 360bbb-3(b)(1), unless the authorization is terminated  or revoked sooner.       Influenza A by PCR NEGATIVE NEGATIVE Final   Influenza B by PCR NEGATIVE NEGATIVE Final    Comment: (NOTE) The Xpert Xpress SARS-CoV-2/FLU/RSV plus assay is intended as an aid in the diagnosis of influenza from Nasopharyngeal swab specimens and should not be used as a sole basis for treatment. Nasal washings and aspirates are unacceptable for Xpert Xpress SARS-CoV-2/FLU/RSV testing.  Fact Sheet for Patients: EntrepreneurPulse.com.au  Fact Sheet for Healthcare  Providers: IncredibleEmployment.be  This test is not yet approved or cleared by the [place] FDA and has been authorized for detection and/or diagnosis of SARS-CoV-2 by FDA under an Emergency Use Authorization (EUA). This EUA will remain in effect (meaning this test can be used) for the duration of the COVID-19 declaration under Section 564(b)(1) of the Act, 21 U.S.C. section 360bbb-3(b)(1), unless the authorization is terminated or revoked.  Performed at [facility], [place] [address] [place], [place] [address]   Blood Culture (routine x 2)     Status: None (Preliminary result)   Collection Time: 03/02/22  9:21 PM   Specimen: BLOOD  Result Value Ref Range Status   Specimen Description   Final    BLOOD RIGHT ANTECUBITAL Performed at [facility] [address], [place], [place] [address]    Special Requests   Final    BOTTLES DRAWN AEROBIC AND ANAEROBIC Blood Culture  results may not be optimal due to an excessive volume of blood received in culture bottles Performed at [facility] [address], [place], [place] [address]    Culture   Final    NO GROWTH 4 DAYS Performed at [facility], [place] [address], [place], [place] [address]    Report Status PENDING  Incomplete  Blood Culture (routine x 2)     Status: None (Preliminary result)   Collection Time: 03/02/22  9:29 PM   Specimen: BLOOD  Result Value Ref Range Status   Specimen Description   Final    BLOOD LEFT ANTECUBITAL Performed at [facility] [address] [place], [place] [address]    Special Requests   Final    BOTTLES DRAWN AEROBIC AND ANAEROBIC Blood Culture results may not be optimal due to an excessive volume of blood received in culture bottles Performed at [facility] [address], [place], [place] [address]    Culture   Final    NO GROWTH 4 DAYS Performed at [place], [place] [address], [place], [place] [address]    Report Status PENDING  Incomplete  Urine Culture     Status: Abnormal   Collection Time: 03/02/22 11:37 PM   Specimen: In/Out Cath Urine  Result Value Ref Range Status   Specimen Description   Final    IN/OUT CATH URINE Performed at [facility] [address], [place], [place] [address]    Special Requests   Final    NONE Performed at [facility], [place] [address], [place], [place] [address]    Culture MULTIPLE SPECIES PRESENT, SUGGEST RECOLLECTION (A)  Final   Report Status 03/03/2022 FINAL  Final    Labs: CBC: Recent Labs  Lab 03/02/22 2119 03/03/22 0627 03/04/22 0543 03/05/22 0548 03/06/22 0541  WBC 15.8* 15.6* 16.3* 13.3* 10.5  HGB 12.1 10.1* 9.2* 8.7* 8.7*  HCT 37.0 31.4* 28.6* 28.1* 27.0*  MCV 82.6 83.7 84.1 85.9 84.4  PLT 279 211 219 236 440   Basic Metabolic Panel: Recent Labs  Lab 03/02/22 2119 03/03/22 0627 03/04/22 0543 03/05/22 0548 03/06/22 0541  NA 138 141 138 139 140  K 3.8 4.0 4.0 3.5 3.4*  CL 105 107 106 108 108  CO2 '22 26 24 24 25  '$ GLUCOSE 117* 102* 122* 106* 98  BUN '19 16 20 19 14  '$ CREATININE 1.44* 0.90 0.88 1.05* 0.90  CALCIUM 9.2 8.6* 8.4* 8.4* 8.1*  MG  --  1.9  --   --   --    Liver Function Tests: Recent Labs  Lab 03/02/22 2119 03/06/22 0541  AST 27 23  ALT 19 20  ALKPHOS 120 116  BILITOT 1.4* 0.8  PROT 8.1 5.7*  ALBUMIN 4.1 2.7*   CBG: No results for input(s): "GLUCAP" in the last 168 hours.  Discharge time spent: less than 30 minutes.  Signed: Marylu Lund, MD [facility] 03/06/2022

## 2022-03-07 LAB — CULTURE, BLOOD (ROUTINE X 2)
Culture: NO GROWTH
Culture: NO GROWTH

## 2022-03-26 ENCOUNTER — Other Ambulatory Visit (HOSPITAL_COMMUNITY): Payer: Self-pay

## 2022-04-22 ENCOUNTER — Other Ambulatory Visit (HOSPITAL_COMMUNITY): Payer: Self-pay

## 2022-04-28 ENCOUNTER — Other Ambulatory Visit (HOSPITAL_COMMUNITY): Payer: Self-pay

## 2022-04-28 MED ORDER — AMLODIPINE BESYLATE 10 MG PO TABS
10.0000 mg | ORAL_TABLET | Freq: Every day | ORAL | 0 refills | Status: DC
  Filled 2022-04-28: qty 30, 30d supply, fill #0

## 2022-05-06 ENCOUNTER — Other Ambulatory Visit (HOSPITAL_COMMUNITY): Payer: Self-pay

## 2022-05-07 ENCOUNTER — Other Ambulatory Visit (HOSPITAL_COMMUNITY): Payer: Self-pay

## 2022-05-27 ENCOUNTER — Other Ambulatory Visit (HOSPITAL_COMMUNITY): Payer: Self-pay

## 2022-06-04 ENCOUNTER — Other Ambulatory Visit (HOSPITAL_COMMUNITY): Payer: Self-pay

## 2022-06-04 MED ORDER — AMLODIPINE BESYLATE 10 MG PO TABS
10.0000 mg | ORAL_TABLET | Freq: Every day | ORAL | 1 refills | Status: DC
  Filled 2022-06-04: qty 90, 90d supply, fill #0
  Filled 2022-07-02: qty 30, 30d supply, fill #0
  Filled 2022-08-03: qty 30, 30d supply, fill #1
  Filled 2022-09-03: qty 30, 30d supply, fill #2

## 2022-06-18 ENCOUNTER — Other Ambulatory Visit (HOSPITAL_COMMUNITY): Payer: Self-pay

## 2022-06-22 ENCOUNTER — Inpatient Hospital Stay (HOSPITAL_COMMUNITY)
Admission: EM | Admit: 2022-06-22 | Discharge: 2022-06-30 | DRG: 862 | Disposition: A | Discharge status: Home Health | Payer: Medicare Other | Attending: Surgery | Admitting: Surgery

## 2022-06-22 ENCOUNTER — Emergency Department (HOSPITAL_COMMUNITY): Payer: Medicare Other

## 2022-06-22 ENCOUNTER — Other Ambulatory Visit: Payer: Self-pay

## 2022-06-22 ENCOUNTER — Encounter (HOSPITAL_COMMUNITY): Payer: Self-pay

## 2022-06-22 DIAGNOSIS — Z885 Allergy status to narcotic agent status: Secondary | ICD-10-CM

## 2022-06-22 DIAGNOSIS — K9189 Other postprocedural complications and disorders of digestive system: Secondary | ICD-10-CM | POA: Diagnosis present

## 2022-06-22 DIAGNOSIS — Z801 Family history of malignant neoplasm of trachea, bronchus and lung: Secondary | ICD-10-CM

## 2022-06-22 DIAGNOSIS — Z853 Personal history of malignant neoplasm of breast: Secondary | ICD-10-CM | POA: Diagnosis not present

## 2022-06-22 DIAGNOSIS — E876 Hypokalemia: Secondary | ICD-10-CM | POA: Diagnosis present

## 2022-06-22 DIAGNOSIS — Z886 Allergy status to analgesic agent status: Secondary | ICD-10-CM

## 2022-06-22 DIAGNOSIS — I10 Essential (primary) hypertension: Secondary | ICD-10-CM | POA: Diagnosis present

## 2022-06-22 DIAGNOSIS — K631 Perforation of intestine (nontraumatic): Secondary | ICD-10-CM | POA: Diagnosis present

## 2022-06-22 DIAGNOSIS — Z923 Personal history of irradiation: Secondary | ICD-10-CM

## 2022-06-22 DIAGNOSIS — E739 Lactose intolerance, unspecified: Secondary | ICD-10-CM | POA: Diagnosis present

## 2022-06-22 DIAGNOSIS — H509 Unspecified strabismus: Secondary | ICD-10-CM | POA: Diagnosis present

## 2022-06-22 DIAGNOSIS — E7800 Pure hypercholesterolemia, unspecified: Secondary | ICD-10-CM | POA: Diagnosis present

## 2022-06-22 DIAGNOSIS — C787 Secondary malignant neoplasm of liver and intrahepatic bile duct: Secondary | ICD-10-CM | POA: Diagnosis present

## 2022-06-22 DIAGNOSIS — C181 Malignant neoplasm of appendix: Secondary | ICD-10-CM | POA: Diagnosis present

## 2022-06-22 DIAGNOSIS — Y733 Surgical instruments, materials and gastroenterology and urology devices (including sutures) associated with adverse incidents: Secondary | ICD-10-CM | POA: Diagnosis present

## 2022-06-22 DIAGNOSIS — E559 Vitamin D deficiency, unspecified: Secondary | ICD-10-CM | POA: Diagnosis present

## 2022-06-22 DIAGNOSIS — C801 Malignant (primary) neoplasm, unspecified: Secondary | ICD-10-CM | POA: Diagnosis present

## 2022-06-22 DIAGNOSIS — Z91018 Allergy to other foods: Secondary | ICD-10-CM

## 2022-06-22 DIAGNOSIS — N179 Acute kidney failure, unspecified: Secondary | ICD-10-CM | POA: Diagnosis present

## 2022-06-22 DIAGNOSIS — Z833 Family history of diabetes mellitus: Secondary | ICD-10-CM | POA: Diagnosis not present

## 2022-06-22 DIAGNOSIS — C799 Secondary malignant neoplasm of unspecified site: Secondary | ICD-10-CM | POA: Diagnosis present

## 2022-06-22 DIAGNOSIS — T8140XA Infection following a procedure, unspecified, initial encounter: Principal | ICD-10-CM | POA: Diagnosis present

## 2022-06-22 DIAGNOSIS — Y838 Other surgical procedures as the cause of abnormal reaction of the patient, or of later complication, without mention of misadventure at the time of the procedure: Secondary | ICD-10-CM | POA: Diagnosis present

## 2022-06-22 DIAGNOSIS — C7989 Secondary malignant neoplasm of other specified sites: Secondary | ICD-10-CM

## 2022-06-22 DIAGNOSIS — T8189XA Other complications of procedures, not elsewhere classified, initial encounter: Secondary | ICD-10-CM | POA: Diagnosis present

## 2022-06-22 LAB — COMPREHENSIVE METABOLIC PANEL
ALT: 16 U/L (ref 0–44)
AST: 26 U/L (ref 15–41)
Albumin: 4.1 g/dL (ref 3.5–5.0)
Alkaline Phosphatase: 154 U/L — ABNORMAL HIGH (ref 38–126)
Anion gap: 9 (ref 5–15)
BUN: 21 mg/dL (ref 8–23)
CO2: 24 mmol/L (ref 22–32)
Calcium: 9.3 mg/dL (ref 8.9–10.3)
Chloride: 102 mmol/L (ref 98–111)
Creatinine, Ser: 1.05 mg/dL — ABNORMAL HIGH (ref 0.44–1.00)
GFR, Estimated: 56 mL/min — ABNORMAL LOW (ref >60)
Glucose, Bld: 137 mg/dL — ABNORMAL HIGH (ref 70–99)
Potassium: 3.8 mmol/L (ref 3.5–5.1)
Sodium: 135 mmol/L (ref 135–145)
Total Bilirubin: 1.1 mg/dL (ref 0.3–1.2)
Total Protein: 8.2 g/dL — ABNORMAL HIGH (ref 6.5–8.1)

## 2022-06-22 LAB — URINALYSIS, ROUTINE W REFLEX MICROSCOPIC
Bilirubin Urine: NEGATIVE
Glucose, UA: NEGATIVE mg/dL
Ketones, ur: NEGATIVE mg/dL
Leukocytes,Ua: NEGATIVE
Nitrite: NEGATIVE
Protein, ur: 100 mg/dL — AB
Specific Gravity, Urine: 1.018 (ref 1.005–1.030)
pH: 5 (ref 5.0–8.0)

## 2022-06-22 LAB — CBC WITH DIFFERENTIAL/PLATELET
Abs Immature Granulocytes: 0.14 10*3/uL — ABNORMAL HIGH (ref 0.00–0.07)
Basophils Absolute: 0.1 10*3/uL (ref 0.0–0.1)
Basophils Relative: 0 %
Eosinophils Absolute: 0.9 10*3/uL — ABNORMAL HIGH (ref 0.0–0.5)
Eosinophils Relative: 4 %
HCT: 34 % — ABNORMAL LOW (ref 36.0–46.0)
Hemoglobin: 10.9 g/dL — ABNORMAL LOW (ref 12.0–15.0)
Immature Granulocytes: 1 %
Lymphocytes Relative: 5 %
Lymphs Abs: 1.1 10*3/uL (ref 0.7–4.0)
MCH: 26.4 pg (ref 26.0–34.0)
MCHC: 32.1 g/dL (ref 30.0–36.0)
MCV: 82.3 fL (ref 80.0–100.0)
Monocytes Absolute: 1.5 10*3/uL — ABNORMAL HIGH (ref 0.1–1.0)
Monocytes Relative: 7 %
Neutro Abs: 19.1 10*3/uL — ABNORMAL HIGH (ref 1.7–7.7)
Neutrophils Relative %: 83 %
Platelets: 303 10*3/uL (ref 150–400)
RBC: 4.13 MIL/uL (ref 3.87–5.11)
RDW: 12.9 % (ref 11.5–15.5)
WBC: 22.9 10*3/uL — ABNORMAL HIGH (ref 4.0–10.5)
nRBC: 0 % (ref 0.0–0.2)

## 2022-06-22 LAB — LIPASE, BLOOD: Lipase: 25 U/L (ref 11–51)

## 2022-06-22 LAB — LACTIC ACID, PLASMA: Lactic Acid, Venous: 1.4 mmol/L (ref 0.5–1.9)

## 2022-06-22 MED ORDER — DIPHENHYDRAMINE HCL 12.5 MG/5ML PO ELIX
12.5000 mg | ORAL_SOLUTION | Freq: Four times a day (QID) | ORAL | Status: DC | PRN
  Administered 2022-06-27: 12.5 mg via ORAL
  Filled 2022-06-22: qty 5

## 2022-06-22 MED ORDER — PROCHLORPERAZINE MALEATE 10 MG PO TABS: 10.0000 mg | ORAL_TABLET | Freq: Four times a day (QID) | ORAL | Status: DC | PRN

## 2022-06-22 MED ORDER — LACTATED RINGERS IV BOLUS (SEPSIS)
1000.0000 mL | Freq: Once | INTRAVENOUS | Status: AC
  Administered 2022-06-22 (×2): 1000 mL via INTRAVENOUS

## 2022-06-22 MED ORDER — IOHEXOL 300 MG/ML  SOLN
100.0000 mL | Freq: Once | INTRAMUSCULAR | Status: AC | PRN
  Administered 2022-06-22: 100 mL via INTRAVENOUS

## 2022-06-22 MED ORDER — ENOXAPARIN SODIUM 40 MG/0.4ML IJ SOSY
40.0000 mg | PREFILLED_SYRINGE | INTRAMUSCULAR | Status: DC
  Administered 2022-06-23 – 2022-06-26 (×5): 40 mg via SUBCUTANEOUS
  Filled 2022-06-22 (×5): qty 0.4

## 2022-06-22 MED ORDER — DIPHENHYDRAMINE HCL 50 MG/ML IJ SOLN
12.5000 mg | Freq: Four times a day (QID) | INTRAMUSCULAR | Status: DC | PRN
  Administered 2022-06-23 – 2022-06-25 (×4): 12.5 mg via INTRAVENOUS
  Filled 2022-06-22 (×4): qty 1

## 2022-06-22 MED ORDER — LACTATED RINGERS IV BOLUS (SEPSIS)
1000.0000 mL | Freq: Once | INTRAVENOUS | Status: AC
  Administered 2022-06-23: 1000 mL via INTRAVENOUS

## 2022-06-22 MED ORDER — MORPHINE SULFATE (PF) 2 MG/ML IV SOLN
1.0000 mg | INTRAVENOUS | Status: DC | PRN
  Administered 2022-06-28: 2 mg via INTRAVENOUS
  Filled 2022-06-22: qty 1

## 2022-06-22 MED ORDER — PROCHLORPERAZINE EDISYLATE 10 MG/2ML IJ SOLN: 5.0000 mg | Freq: Four times a day (QID) | INTRAMUSCULAR | Status: DC | PRN

## 2022-06-22 MED ORDER — AMLODIPINE BESYLATE 10 MG PO TABS
10.0000 mg | ORAL_TABLET | Freq: Every day | ORAL | Status: DC
  Administered 2022-06-23 – 2022-06-30 (×8): 10 mg via ORAL
  Filled 2022-06-22: qty 1
  Filled 2022-06-22: qty 2
  Filled 2022-06-22 (×6): qty 1

## 2022-06-22 MED ORDER — SODIUM CHLORIDE 0.9 % IV SOLN
2.0000 g | Freq: Once | INTRAVENOUS | Status: AC
  Administered 2022-06-22: 2 g via INTRAVENOUS
  Filled 2022-06-22: qty 12.5

## 2022-06-22 MED ORDER — AMLODIPINE BESYLATE 5 MG PO TABS: 10.0000 mg | ORAL_TABLET | Freq: Every day | ORAL | Status: DC

## 2022-06-22 MED ORDER — LACTATED RINGERS IV BOLUS (SEPSIS)
500.0000 mL | Freq: Once | INTRAVENOUS | Status: AC
  Administered 2022-06-23: 500 mL via INTRAVENOUS

## 2022-06-22 MED ORDER — LACTATED RINGERS IV SOLN: INTRAVENOUS | Status: AC

## 2022-06-22 MED ORDER — SODIUM CHLORIDE 0.9 % IV SOLN
2.0000 g | Freq: Two times a day (BID) | INTRAVENOUS | Status: DC
  Administered 2022-06-23 – 2022-06-24 (×4): 2 g via INTRAVENOUS
  Filled 2022-06-22 (×5): qty 12.5

## 2022-06-22 MED ORDER — METRONIDAZOLE 500 MG/100ML IV SOLN
500.0000 mg | Freq: Once | INTRAVENOUS | Status: AC
  Administered 2022-06-22: 500 mg via INTRAVENOUS
  Filled 2022-06-22: qty 100

## 2022-06-22 MED ORDER — POTASSIUM CHLORIDE IN NACL 20-0.9 MEQ/L-% IV SOLN
INTRAVENOUS | Status: DC
  Filled 2022-06-22 (×9): qty 1000

## 2022-06-22 NOTE — Progress Notes (Signed)
Pharmacy Antibiotic Note  Christina [name] is a 76 y.o. female admitted on 06/22/2022 has been vomiting all day. Having abdominal pain right around her belly button and to the right. No appetite. No diarrhea. Patient had her appendix taken out a month ago. Having pus draining from her belly button.  Pharmacy has been consulted for cefepime dosing.  Plan: Cefepime 2gm IV q12h Follow renal function, cultures and clinical course  Weight: 83.9 kg (184 lb 15.5 oz)  Temp (24hrs), Avg:99.3 F (37.4 C), Min:98.4 F (36.9 C), Max:100.2 F (37.9 C)  Recent Labs  Lab 06/22/22 1742  WBC 22.9*  CREATININE 1.05*    Estimated Creatinine Clearance: 51.3 mL/min (A) (by C-G formula based on SCr of 1.05 mg/dL (H)).    Allergies  Allergen Reactions   Onion Nausea And Vomiting    White onion   Simvastatin Other (See Comments)    myalgia   Aspirin Nausea Only and Nausea And Vomiting   Hydrocodone Rash    With itching    Antimicrobials this admission: 11/1 cefepime >> 11/1 flagyl x 1  Dose adjustments this admission:   Microbiology results: 10/31 BCx:    Thank you for allowing pharmacy to be a part of this patient's care.  Dolly Rias RPh 06/22/2022, 11:10 PM

## 2022-06-22 NOTE — ED Provider Triage Note (Signed)
Emergency Medicine Provider Triage Evaluation Note  Christina [name] , a 76 y.o. female  was evaluated in triage.  Pt complains of nausea and vomiting onset last night. Hx of laparoscopic appendectomy on 03/03/2022. She has developed drainage from the surgical site at the umbilicus  Review of Systems  Positive: Abdominal pain, nausea, emesis Negative: Shortness of breath, fever, chills  Physical Exam  BP (!) 144/78 (BP Location: Left Arm)   Pulse (!) 120   Temp 98.4 F (36.9 C) (Oral)   Resp 19   SpO2 100%  Gen:   Awake, no distress   Resp:  Normal effort  MSK:   Moves extremities without difficulty  Other:  Abdominal pain from umbilicus to RLQ. Drainage from umbilicus  Medical Decision Making  Medically screening exam initiated at 4:46 PM.  Appropriate orders placed.  Christina [name] was informed that the remainder of the evaluation will be completed by another provider, this initial triage assessment does not replace that evaluation, and the importance of remaining in the ED until their evaluation is complete.     Christina Quill, NP 06/22/22 1730

## 2022-06-22 NOTE — ED Triage Notes (Addendum)
Patient has been vomiting all day. Having abdominal pain right around her belly button and to the right. No appetite. No diarrhea. Patient had her appendix taken out a month ago. Having pus draining from her belly button.

## 2022-06-22 NOTE — ED Provider Notes (Signed)
[place] DEPT Provider Note   CSN: [id number] Arrival date & time: 06/22/22  1627     History  Chief Complaint  Patient presents with   Emesis    Christina [name] is a 76 y.o. female.  HPI Patient reports she has had recurrent vomiting for several days.  Since last night she had multiple episodes of thin yellow emesis.  She denies any bloody vomit.  She denies she has a significant amount of pain.  She reports she does have a area of drainage adjacent to her umbilicus where she had a laparoscopic appendectomy done.  She was concerned because sometimes the drainage is foul-smelling.  Intermittently she does get pain that seems to radiate to the right lower lateral quadrant but it is not continuous and not severe at this time.  No fevers, no chills, no pain burning urgency with urination.    Home Medications Prior to Admission medications   Medication Sig Start Date End Date Taking? Authorizing Provider  acetaminophen (TYLENOL) 500 MG tablet Take 500 mg by mouth every 6 (six) hours as needed for fever.   Yes [provider]  amLODipine (NORVASC) 10 MG tablet Take 1 tablet (10 mg total) by mouth daily. - Dose change. 06/04/22  Yes   olmesartan (BENICAR) 20 MG tablet Take 1 tablet (20 mg total) by mouth daily. Patient taking differently: Take 20 mg by mouth at bedtime. 08/21/21  Yes   amLODipine (NORVASC) 10 MG tablet Take 1 tablet (10 mg total) by mouth daily. Patient not taking: Reported on 06/22/2022 04/28/22   Park Meo T, PA-C  fexofenadine (ALLEGRA) 60 MG tablet Take 1 tablet by mouth 2 times daily. Patient not taking: Reported on 06/22/2022 12/02/21   Raspet, Junie Panning K, PA-C  olopatadine (PATADAY) 0.1 % ophthalmic solution Place 1 drop into both eyes 2 times daily. Patient not taking: Reported on 06/22/2022 12/02/21   Raspet, Junie Panning K, PA-C  oxyCODONE (OXY IR/ROXICODONE) 5 MG immediate release tablet Take 1-2 tablets (5-10 mg total) by  mouth every 6 (six) hours as needed for moderate pain or severe pain. Patient not taking: Reported on 06/22/2022 03/06/22   Donne Jakala, MD  losartan-hydrochlorothiazide (HYZAAR) 100-25 MG tablet Take 1 tablet by mouth daily. 12/31/20 06/03/21        Allergies    Onion, Simvastatin, Aspirin, and Hydrocodone    Review of Systems   Review of Systems  Physical Exam Updated Vital Signs BP 116/64   Pulse 93   Temp 98.9 F (37.2 C) (Oral)   Resp (!) 29   Wt 83.9 kg   SpO2 96%   BMI 29.85 kg/m  Physical Exam Constitutional:      Comments: Patient is alert and nontoxic.  Clinically well in appearance.  No respiratory distress.  No acute distress.  HENT:     Mouth/Throat:     Pharynx: Oropharynx is clear.  Eyes:     Comments: Right strabismus.  Cardiovascular:     Rate and Rhythm: Normal rate and regular rhythm.  Pulmonary:     Effort: Pulmonary effort is normal.     Breath sounds: Normal breath sounds.  Abdominal:     Comments: Abdomen nondistended.  Not significantly tender to palpation.  Deep palpation does not elicit any guarding or complaint of significant pain.  Patient has what appears to be about a 4 mm granuloma at the edge of the umbilicus.  At this time there is no active drainage from this.  It is not obviously open or fistulized.  No palpable fluctuance.  Musculoskeletal:        General: No swelling. Normal range of motion.     Right lower leg: No edema.     Left lower leg: No edema.  Skin:    General: Skin is warm and dry.  Neurological:     General: No focal deficit present.     Mental Status: She is oriented to person, place, and time.     Motor: No weakness.     Coordination: Coordination normal.  Psychiatric:        Mood and Affect: Mood normal.    ED Results / Procedures / Treatments   Labs (all labs ordered are listed, but only abnormal results are displayed) Labs Reviewed  COMPREHENSIVE METABOLIC PANEL - Abnormal; Notable for the following  components:      Result Value   Glucose, Bld 137 (*)    Creatinine, Ser 1.05 (*)    Total Protein 8.2 (*)    Alkaline Phosphatase 154 (*)    GFR, Estimated 56 (*)    All other components within normal limits  URINALYSIS, ROUTINE W REFLEX MICROSCOPIC - Abnormal; Notable for the following components:   APPearance HAZY (*)    Hgb urine dipstick SMALL (*)    Protein, ur 100 (*)    Bacteria, UA RARE (*)    All other components within normal limits  CBC WITH DIFFERENTIAL/PLATELET - Abnormal; Notable for the following components:   WBC 22.9 (*)    Hemoglobin 10.9 (*)    HCT 34.0 (*)    Neutro Abs 19.1 (*)    Monocytes Absolute 1.5 (*)    Eosinophils Absolute 0.9 (*)    Abs Immature Granulocytes 0.14 (*)    All other components within normal limits  BASIC METABOLIC PANEL - Abnormal; Notable for the following components:   Glucose, Bld 116 (*)    Calcium 8.7 (*)    All other components within normal limits  CBC - Abnormal; Notable for the following components:   WBC 17.2 (*)    RBC 3.53 (*)    Hemoglobin 9.4 (*)    HCT 29.1 (*)    All other components within normal limits  CULTURE, BLOOD (SINGLE)  LIPASE, BLOOD  LACTIC ACID, PLASMA  LACTIC ACID, PLASMA    EKG None  Radiology CT ABDOMEN PELVIS W CONTRAST  Result Date: 06/22/2022 CLINICAL DATA:  Abdominal pain, post-op. Appendectomy 1 month prior, purulent drainage from umbilicus, periumbilical abdominal pain, vomiting. EXAM: CT ABDOMEN AND PELVIS WITH CONTRAST TECHNIQUE: Multidetector CT imaging of the abdomen and pelvis was performed using the standard protocol following bolus administration of intravenous contrast. RADIATION DOSE REDUCTION: This exam was performed according to the departmental dose-optimization program which includes automated exposure control, adjustment of the mA and/or kV according to patient size and/or use of iterative reconstruction technique. CONTRAST:  141m OMNIPAQUE IOHEXOL 300 MG/ML  SOLN COMPARISON:   03/02/2022 FINDINGS: Lower chest: No acute abnormality. Hepatobiliary: Multiple bilobar hypoenhancing masses have developed in the liver most in keeping with multiple hepatic metastases. Index lesion within the right hepatic lobe measures 2.8 x 4.4 cm at axial image # 32/2. No intra or extrahepatic biliary ductal dilation. Gallbladder unremarkable. Pancreas: Unremarkable Spleen: Unremarkable Adrenals/Urinary Tract: The adrenal glands are unremarkable. The kidneys are normal in size and position. Simple cortical cysts are seen within the kidneys bilaterally. No follow-up imaging is recommended for these lesions. The kidneys are otherwise unremarkable. Bladder is  decompressed and is unremarkable. Stomach/Bowel: Interval appendectomy. There is interstitial and extraluminal gas seen surrounding the surgical staples at the resection margin in keeping with breakdown of the resection margin and bowel perforation in this location. There is extensive cecal wall thickening in this region as well as circumferential bowel wall thickening involving the terminal ileum in this region, possibly reactive in nature. There is associated pathologic adenopathy involving the ileocolic lymph nodes which measure up to 15 mm in short axis diameter at axial image # 58/2. Together, this raises the question of malignant infiltration in the setting of an underlying colonic or appendiceal malignancy and associated wound breakdown at the surgical margin. No loculated intra-abdominal fluid collection is identified. Extensive infiltrative changes seen within the pericecal soft tissues. No free intraperitoneal fluid or gross free intraperitoneal gas identified. The stomach, small bowel, and large bowel are otherwise unremarkable. No evidence of obstruction. Vascular/Lymphatic: No significant vascular findings are present. No enlarged abdominal or pelvic lymph nodes. Reproductive: Status post hysterectomy. No adnexal masses. Other: Tiny fat  containing umbilical hernia. No associated fluid collection. Musculoskeletal: Degenerative changes are seen within the lumbar spine. No acute bone abnormality. IMPRESSION: 1. Interval appendectomy. Extensive interstitial and extraluminal gas seen surrounding the surgical staples at the resection margin in keeping with breakdown of the resection margin and bowel perforation in this location. Extensive cecal wall thickening in this region as well as circumferential bowel wall thickening involving the terminal ileum in this region, possibly reactive in nature. Associated pathologic adenopathy involving the ileocolic lymph nodes. Together, this raises the question of malignant infiltration in the setting of an underlying colonic or appendiceal malignancy and associated wound breakdown at the surgical margin. No loculated intra-abdominal fluid collection identified. 2. Interval development of multiple bilobar hypoenhancing masses in keeping with multiple hepatic metastases. Electronically Signed   By: Fidela Salisbury M.D.   On: 06/22/2022 22:09    Procedures Procedures   CRITICAL CARE Performed by: Charlesetta Shanks   Total critical care time: 30 minutes  Critical care time was exclusive of separately billable procedures and treating other patients.  Critical care was necessary to treat or prevent imminent or life-threatening deterioration.  Critical care was time spent personally by me on the following activities: development of treatment plan with patient and/or surrogate as well as nursing, discussions with consultants, evaluation of patient's response to treatment, examination of patient, obtaining history from patient or surrogate, ordering and performing treatments and interventions, ordering and review of laboratory studies, ordering and review of radiographic studies, pulse oximetry and re-evaluation of patient's condition.  Medications Ordered in ED Medications  lactated ringers infusion (  Intravenous Not Given 06/23/22 0521)  amLODipine (NORVASC) tablet 10 mg (10 mg Oral Given 06/23/22 0927)  enoxaparin (LOVENOX) injection 40 mg (40 mg Subcutaneous Given 06/23/22 0629)  morphine (PF) 2 MG/ML injection 1-3 mg (has no administration in time range)  prochlorperazine (COMPAZINE) tablet 10 mg (has no administration in time range)    Or  prochlorperazine (COMPAZINE) injection 5-10 mg (has no administration in time range)  diphenhydrAMINE (BENADRYL) 12.5 MG/5ML elixir 12.5 mg (has no administration in time range)    Or  diphenhydrAMINE (BENADRYL) injection 12.5 mg (has no administration in time range)  0.9 % NaCl with KCl 20 mEq/ L  infusion (0 mL/hr Intravenous Stopped 06/23/22 1525)  ceFEPIme (MAXIPIME) 2 g in sodium chloride 0.9 % 100 mL IVPB (0 g Intravenous Stopped 06/23/22 1215)  metroNIDAZOLE (FLAGYL) IVPB 500 mg (0 mg Intravenous Stopped 06/23/22  1111)  iohexol (OMNIPAQUE) 300 MG/ML solution 100 mL (100 mLs Intravenous Contrast Given 06/22/22 2143)  lactated ringers bolus 1,000 mL (0 mLs Intravenous Stopped 06/23/22 0520)    And  lactated ringers bolus 1,000 mL (0 mLs Intravenous Stopped 06/23/22 0520)    And  lactated ringers bolus 500 mL (0 mLs Intravenous Stopped 06/23/22 0520)  ceFEPIme (MAXIPIME) 2 g in sodium chloride 0.9 % 100 mL IVPB (0 g Intravenous Stopped 06/22/22 2347)  metroNIDAZOLE (FLAGYL) IVPB 500 mg (0 mg Intravenous Stopped 06/23/22 0019)    ED Course/ Medical Decision Making/ A&P                           Medical Decision Making Amount and/or Complexity of Data Reviewed Labs: ordered.  Risk Decision regarding hospitalization.  Presents with recurrent vomiting.  Clinically she is nontoxic and afebrile.  Blood pressures are normotensive.  Patient however has history of complex appendectomy in July and concern for possible obstruction, delayed postsurgical complication, UTI, pancreatitis.  We will proceed with diagnostic evaluation and agree with CT scan.  CT  scan reviewed by radiology and also visualized by myself, positive for gas surrounding prior surgical site concerning for breakdown of resection margin and perforation.  Also noted numerous lesions in the liver consistent with metastatic lesions.  I have reviewed CT findings with patient.  She is alert and in no acute distress.  Vital signs are stable.  Blood pressures are normal.  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  39: 81 reviewed with Dr. Brantley Stage general surgery.  He will review CT results and advise for planning.    General surgery will admit the patient for further diagnostic evaluation and management.  I have initiated broad-spectrum antibiotic coverage and fluid resuscitation.        Final Clinical Impression(s) / ED Diagnoses Final diagnoses:  Bowel perforation ([place])  Malignant neoplasm metastatic to other site [facility])    Rx / [place] Orders ED Discharge Orders     None         Charlesetta Shanks, MD 06/25/22 2334

## 2022-06-23 LAB — BASIC METABOLIC PANEL
Anion gap: 6 (ref 5–15)
BUN: 20 mg/dL (ref 8–23)
CO2: 24 mmol/L (ref 22–32)
Calcium: 8.7 mg/dL — ABNORMAL LOW (ref 8.9–10.3)
Chloride: 106 mmol/L (ref 98–111)
Creatinine, Ser: 0.97 mg/dL (ref 0.44–1.00)
GFR, Estimated: >60 mL/min (ref >60)
Glucose, Bld: 116 mg/dL — ABNORMAL HIGH (ref 70–99)
Potassium: 3.8 mmol/L (ref 3.5–5.1)
Sodium: 136 mmol/L (ref 135–145)

## 2022-06-23 LAB — CBC
HCT: 29.1 % — ABNORMAL LOW (ref 36.0–46.0)
Hemoglobin: 9.4 g/dL — ABNORMAL LOW (ref 12.0–15.0)
MCH: 26.6 pg (ref 26.0–34.0)
MCHC: 32.3 g/dL (ref 30.0–36.0)
MCV: 82.4 fL (ref 80.0–100.0)
Platelets: 223 10*3/uL (ref 150–400)
RBC: 3.53 MIL/uL — ABNORMAL LOW (ref 3.87–5.11)
RDW: 13 % (ref 11.5–15.5)
WBC: 17.2 10*3/uL — ABNORMAL HIGH (ref 4.0–10.5)
nRBC: 0 % (ref 0.0–0.2)

## 2022-06-23 LAB — LACTIC ACID, PLASMA: Lactic Acid, Venous: 1.3 mmol/L (ref 0.5–1.9)

## 2022-06-23 MED ORDER — METRONIDAZOLE 500 MG/100ML IV SOLN
500.0000 mg | Freq: Two times a day (BID) | INTRAVENOUS | Status: AC
  Administered 2022-06-23 – 2022-06-29 (×14): 500 mg via INTRAVENOUS
  Filled 2022-06-23 (×14): qty 100

## 2022-06-23 NOTE — H&P (Signed)
Christina [name] 1946/05/15  [id number].    Chief Complaint/Reason for Consult: vomiting  HPI:  This is a 76 yo female who underwent a lap appy for a perforated appendicitis by Dr. Brantley Stage on 03/03/22.  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  She was referred to [facility] and saw Dr. Clovis Riley on 04/02/22.  She was doing well at the time and was recommended to get endoscopic evaluation and further follow up as needed.  It does not appear the patient underwent endoscopic evaluation.  She is not a great historian so difficult to obtain information on what has really brought her to the ED today except she has having some "spitting up" episodes that I think started just within the last 48 hrs.   She initially denied any abdominal pain, but then will state she occasionally has some. She has also had some drainage from her umbilical incision maybe as well.    Here in the ED she has been found to have a WBC of  22.9, down to 17 today, hgb of 9.4 and a CT scan showing extensive interstitial and extraluminal gas surrounding the surgical staples at the appendiceal resection margin suggesting breakdown of this resection margin and possible perforation.  There is also extensive cecal wall thickening and circumferential bowel wall thickening up to TI with likely pathologic adenopathy and new development of bilobar masses in the liver c/w hepatic metastases.  We have been asked to see her.  ROS: ROS: see HPI  Family History  Problem Relation Age of Onset   Cancer Mother    Diabetes Sister    Lung cancer Brother    Diabetes Brother     Past Medical History:  Diagnosis Date   Anemia    Breast cancer ([place]) 08/28/2015   Bilateral   Breast cancer of upper-outer quadrant of right female breast ([place]) 06/23/2015   High cholesterol    Hx of radiation therapy 11/05/15- 12/16/15   Right Breast and Left Breast   Hypertension    Personal history of radiation therapy    Right Breast Cancer    Vitamin D deficiency 06/29/2018    Past Surgical History:  Procedure Laterality Date   BREAST LUMPECTOMY Left 08/28/2015   BREAST LUMPECTOMY Right 08/28/2015   BREAST LUMPECTOMY WITH NEEDLE LOCALIZATION AND AXILLARY SENTINEL LYMPH NODE BX Bilateral 08/28/2015   Procedure: BILATERAL BREAST LUMPECTOMY WITH BILATERAL  NEEDLE LOCALIZATION AND BILATERAL AXILLARY SENTINEL LYMPH NODE BX;  Surgeon: Erroll Luna, MD;  Location: [facility];  Service: General;  Laterality: Bilateral;   LAPAROSCOPIC APPENDECTOMY N/A 03/03/2022   Procedure: APPENDECTOMY LAPAROSCOPIC;  Surgeon: Erroll Luna, MD;  Location: [facility];  Service: General;  Laterality: N/A;    Social History:  reports that she has never smoked. She has never used smokeless tobacco. She reports that she does not drink alcohol and does not use drugs.  Allergies:  Allergies  Allergen Reactions   Onion Nausea And Vomiting    White onion   Simvastatin Other (See Comments)    myalgia   Aspirin Nausea Only and Nausea And Vomiting   Hydrocodone Rash    With itching    (Not in a hospital admission)    Physical Exam: Blood pressure 129/65, pulse 85, temperature 99.2 F (37.3 C), temperature source Axillary, resp. rate 16, weight 83.9 kg, SpO2 98 %. General: pleasant, WD, WN female who is laying in bed in NAD HEENT: head is normocephalic, atraumatic.  Sclera are noninjected.  PERRL.  Ears and nose without any masses or lesions.  Mouth is pink and moist Heart: regular, rate, and rhythm.  Normal s1,s2. No obvious murmurs, gallops, or rubs noted.  Palpable radial and pedal pulses bilaterally Lungs: CTAB, no wheezes, rhonchi, or rales noted.  Respiratory effort nonlabored Abd: soft, minimally tender in RLQ, no guarding, rebounding, or peritonitis. ND, +BS, no masses, hernias, or organomegaly.  All incisions are well healed.  Her umbilical incisions is a bit nodular with a small area of excoriation (but beefy red, max 0.25x0.25cm) just right lateral  and inferior to her scan.  No definite drainage noted, but concerning for possible disease at this site. MS: all 4 extremities are symmetrical with no cyanosis, clubbing, or edema. Skin: warm and dry with no masses, lesions, or rashes Neuro: Cranial nerves 2-12 grossly intact, sensation is normal throughout Psych: A&Ox3 with an appropriate affect.   Results for orders placed or performed during the hospital encounter of 06/22/22 (from the past 48 hour(s))  Lipase, blood     Status: None   Collection Time: 06/22/22  5:42 PM  Result Value Ref Range   Lipase 25 11 - 51 U/L    Comment: Performed at [facility], [place] [address] [place], [place] [address]  Comprehensive metabolic panel     Status: Abnormal   Collection Time: 06/22/22  5:42 PM  Result Value Ref Range   Sodium 135 135 - 145 mmol/L   Potassium 3.8 3.5 - 5.1 mmol/L   Chloride 102 98 - 111 mmol/L   CO2 24 22 - 32 mmol/L   Glucose, Bld 137 (H) 70 - 99 mg/dL    Comment: Glucose reference range applies only to samples taken after fasting for at least 8 hours.   BUN 21 8 - 23 mg/dL   Creatinine, Ser 1.05 (H) 0.44 - 1.00 mg/dL   Calcium 9.3 8.9 - 10.3 mg/dL   Total Protein 8.2 (H) 6.5 - 8.1 g/dL   Albumin 4.1 3.5 - 5.0 g/dL   AST 26 15 - 41 U/L   ALT 16 0 - 44 U/L   Alkaline Phosphatase 154 (H) 38 - 126 U/L   Total Bilirubin 1.1 0.3 - 1.2 mg/dL   GFR, Estimated 56 (L) >60 mL/min    Comment: (NOTE) Calculated using the CKD-EPI Creatinine Equation (2021)    Anion gap 9 5 - 15    Comment: Performed at [facility], [place] [address] [place], [place] [address]  CBC with Differential     Status: Abnormal   Collection Time: 06/22/22  5:42 PM  Result Value Ref Range   WBC 22.9 (H) 4.0 - 10.5 K/uL    Comment: WHITE COUNT CONFIRMED ON SMEAR   RBC 4.13 3.87 - 5.11 MIL/uL   Hemoglobin 10.9 (L) 12.0 - 15.0 g/dL   HCT 34.0 (L) 36.0 - 46.0 %   MCV 82.3 80.0 - 100.0 fL   MCH 26.4 26.0 - 34.0 pg    MCHC 32.1 30.0 - 36.0 g/dL   RDW 12.9 11.5 - 15.5 %   Platelets 303 150 - 400 K/uL   nRBC 0.0 0.0 - 0.2 %   Neutrophils Relative % 83 %   Neutro Abs 19.1 (H) 1.7 - 7.7 K/uL   Lymphocytes Relative 5 %   Lymphs Abs 1.1 0.7 - 4.0 K/uL   Monocytes Relative 7 %   Monocytes Absolute 1.5 (H) 0.1 - 1.0 K/uL   Eosinophils Relative 4 %   Eosinophils Absolute  0.9 (H) 0.0 - 0.5 K/uL   Basophils Relative 0 %   Basophils Absolute 0.1 0.0 - 0.1 K/uL   WBC Morphology TOXIC GRANULATION     Comment: VACUOLATED NEUTROPHILS   RBC Morphology MORPHOLOGY UNREMARKABLE    Immature Granulocytes 1 %   Abs Immature Granulocytes 0.14 (H) 0.00 - 0.07 K/uL    Comment: Performed at [facility], [place] [address], [place], [place] [address]  Urinalysis, Routine w reflex microscopic Urine, Clean Catch     Status: Abnormal   Collection Time: 06/22/22  6:12 PM  Result Value Ref Range   Color, Urine YELLOW YELLOW   APPearance HAZY (A) CLEAR   Specific Gravity, Urine 1.018 1.005 - 1.030   pH 5.0 5.0 - 8.0   Glucose, UA NEGATIVE NEGATIVE mg/dL   Hgb urine dipstick SMALL (A) NEGATIVE   Bilirubin Urine NEGATIVE NEGATIVE   Ketones, ur NEGATIVE NEGATIVE mg/dL   Protein, ur 100 (A) NEGATIVE mg/dL   Nitrite NEGATIVE NEGATIVE   Leukocytes,Ua NEGATIVE NEGATIVE   RBC / HPF 0-5 0 - 5 RBC/hpf   WBC, UA 0-5 0 - 5 WBC/hpf   Bacteria, UA RARE (A) NONE SEEN   Squamous Epithelial / LPF 0-5 0 - 5   Mucus PRESENT    Hyaline Casts, UA PRESENT     Comment: Performed at [facility], [place] [address] [place], [place] [address]  Lactic acid, plasma     Status: None   Collection Time: 06/22/22 10:51 PM  Result Value Ref Range   Lactic Acid, Venous 1.4 0.5 - 1.9 mmol/L    Comment: Performed at [facility], [place] [address] [place], [place] [address]  Basic metabolic panel     Status: Abnormal   Collection Time: 06/23/22  5:56 AM  Result Value Ref Range   Sodium 136 135 - 145  mmol/L   Potassium 3.8 3.5 - 5.1 mmol/L   Chloride 106 98 - 111 mmol/L   CO2 24 22 - 32 mmol/L   Glucose, Bld 116 (H) 70 - 99 mg/dL    Comment: Glucose reference range applies only to samples taken after fasting for at least 8 hours.   BUN 20 8 - 23 mg/dL   Creatinine, Ser 0.97 0.44 - 1.00 mg/dL   Calcium 8.7 (L) 8.9 - 10.3 mg/dL   GFR, Estimated >60 >60 mL/min    Comment: (NOTE) Calculated using the CKD-EPI Creatinine Equation (2021)    Anion gap 6 5 - 15    Comment: Performed at [facility], [place] [address] [place], [place] [address]  CBC     Status: Abnormal   Collection Time: 06/23/22  5:56 AM  Result Value Ref Range   WBC 17.2 (H) 4.0 - 10.5 K/uL   RBC 3.53 (L) 3.87 - 5.11 MIL/uL   Hemoglobin 9.4 (L) 12.0 - 15.0 g/dL   HCT 29.1 (L) 36.0 - 46.0 %   MCV 82.4 80.0 - 100.0 fL   MCH 26.6 26.0 - 34.0 pg   MCHC 32.3 30.0 - 36.0 g/dL   RDW 13.0 11.5 - 15.5 %   Platelets 223 150 - 400 K/uL   nRBC 0.0 0.0 - 0.2 %    Comment: Performed at [facility], [place] [address], [place], [place] [address]   CT ABDOMEN PELVIS W CONTRAST  Result Date: 06/22/2022 CLINICAL DATA:  Abdominal pain, post-op. Appendectomy 1 month prior, purulent drainage from umbilicus, periumbilical abdominal pain, vomiting. EXAM:  CT ABDOMEN AND PELVIS WITH CONTRAST TECHNIQUE: Multidetector CT imaging of the abdomen and pelvis was performed using the standard protocol following bolus administration of intravenous contrast. RADIATION DOSE REDUCTION: This exam was performed according to the departmental dose-optimization program which includes automated exposure control, adjustment of the mA and/or kV according to patient size and/or use of iterative reconstruction technique. CONTRAST:  140m OMNIPAQUE IOHEXOL 300 MG/ML  SOLN COMPARISON:  03/02/2022 FINDINGS: Lower chest: No acute abnormality. Hepatobiliary: Multiple bilobar hypoenhancing masses have developed in the liver most in keeping  with multiple hepatic metastases. Index lesion within the right hepatic lobe measures 2.8 x 4.4 cm at axial image # 32/2. No intra or extrahepatic biliary ductal dilation. Gallbladder unremarkable. Pancreas: Unremarkable Spleen: Unremarkable Adrenals/Urinary Tract: The adrenal glands are unremarkable. The kidneys are normal in size and position. Simple cortical cysts are seen within the kidneys bilaterally. No follow-up imaging is recommended for these lesions. The kidneys are otherwise unremarkable. Bladder is decompressed and is unremarkable. Stomach/Bowel: Interval appendectomy. There is interstitial and extraluminal gas seen surrounding the surgical staples at the resection margin in keeping with breakdown of the resection margin and bowel perforation in this location. There is extensive cecal wall thickening in this region as well as circumferential bowel wall thickening involving the terminal ileum in this region, possibly reactive in nature. There is associated pathologic adenopathy involving the ileocolic lymph nodes which measure up to 15 mm in short axis diameter at axial image # 58/2. Together, this raises the question of malignant infiltration in the setting of an underlying colonic or appendiceal malignancy and associated wound breakdown at the surgical margin. No loculated intra-abdominal fluid collection is identified. Extensive infiltrative changes seen within the pericecal soft tissues. No free intraperitoneal fluid or gross free intraperitoneal gas identified. The stomach, small bowel, and large bowel are otherwise unremarkable. No evidence of obstruction. Vascular/Lymphatic: No significant vascular findings are present. No enlarged abdominal or pelvic lymph nodes. Reproductive: Status post hysterectomy. No adnexal masses. Other: Tiny fat containing umbilical hernia. No associated fluid collection. Musculoskeletal: Degenerative changes are seen within the lumbar spine. No acute bone abnormality.  IMPRESSION: 1. Interval appendectomy. Extensive interstitial and extraluminal gas seen surrounding the surgical staples at the resection margin in keeping with breakdown of the resection margin and bowel perforation in this location. Extensive cecal wall thickening in this region as well as circumferential bowel wall thickening involving the terminal ileum in this region, possibly reactive in nature. Associated pathologic adenopathy involving the ileocolic lymph nodes. Together, this raises the question of malignant infiltration in the setting of an underlying colonic or appendiceal malignancy and associated wound breakdown at the surgical margin. No loculated intra-abdominal fluid collection identified. 2. Interval development of multiple bilobar hypoenhancing masses in keeping with multiple hepatic metastases. Electronically Signed   By: AFidela SalisburyM.D.   On: 06/22/2022 22:09      Assessment/Plan Progression of low grade appendiceal mucinous neoplasm with possible breakdown of surgical margin and metastatic disease The patient has been seen, examined, chart, labs, vitals, care everywhere, and imaging personally reviewed.  The patient clearly has progression of her malignant process.  She also appears to have possible breakdown of her surgical resection margin from her appendectomy despite being over 3 months post op.  The patient is very stable with normal vital signs.  She actually is not having really any significant abdominal pain.  We have called [facility] to discuss a transfer for this patient given she has seen Dr. LClovis Rileyregarding  this problem and it is now more advanced, but given the advancement, not sure if they have anything else to offer except recommend chemotherapy.  We will await a call back from them and determine further plans.   FEN - NPO/IVFs VTE - Lovenox ID - maxipime/Flagyl Admit - inpatient, possible tx  I reviewed ED provider notes, last 24 h vitals and pain scores, last 48  h intake and output, last 24 h labs and trends, and last 24 h imaging results.  Henreitta Cea, [facility] 06/23/2022, 8:49 AM Please see Amion for pager number during day hours 7:00am-4:30pm or 7:00am -11:30am on weekends

## 2022-06-23 NOTE — Sepsis Progress Note (Signed)
Following for sepsis monitoring ?

## 2022-06-24 LAB — BASIC METABOLIC PANEL
Anion gap: 9 (ref 5–15)
BUN: 18 mg/dL (ref 8–23)
CO2: 21 mmol/L — ABNORMAL LOW (ref 22–32)
Calcium: 8.6 mg/dL — ABNORMAL LOW (ref 8.9–10.3)
Chloride: 109 mmol/L (ref 98–111)
Creatinine, Ser: 0.89 mg/dL (ref 0.44–1.00)
GFR, Estimated: >60 mL/min (ref >60)
Glucose, Bld: 94 mg/dL (ref 70–99)
Potassium: 4.2 mmol/L (ref 3.5–5.1)
Sodium: 139 mmol/L (ref 135–145)

## 2022-06-24 LAB — CBC
HCT: 28.2 % — ABNORMAL LOW (ref 36.0–46.0)
Hemoglobin: 8.7 g/dL — ABNORMAL LOW (ref 12.0–15.0)
MCH: 26.5 pg (ref 26.0–34.0)
MCHC: 30.9 g/dL (ref 30.0–36.0)
MCV: 86 fL (ref 80.0–100.0)
Platelets: 223 10*3/uL (ref 150–400)
RBC: 3.28 MIL/uL — ABNORMAL LOW (ref 3.87–5.11)
RDW: 13.1 % (ref 11.5–15.5)
WBC: 14.8 10*3/uL — ABNORMAL HIGH (ref 4.0–10.5)
nRBC: 0 % (ref 0.0–0.2)

## 2022-06-24 MED ORDER — ACETAMINOPHEN 325 MG PO TABS
650.0000 mg | ORAL_TABLET | Freq: Four times a day (QID) | ORAL | Status: DC | PRN
  Administered 2022-06-24 (×2): 650 mg via ORAL
  Filled 2022-06-24 (×3): qty 2

## 2022-06-24 MED ORDER — ALUM & MAG HYDROXIDE-SIMETH 200-200-20 MG/5ML PO SUSP
30.0000 mL | Freq: Four times a day (QID) | ORAL | Status: DC | PRN
  Administered 2022-06-24: 30 mL via ORAL
  Filled 2022-06-24: qty 30

## 2022-06-24 NOTE — Progress Notes (Signed)
Patient's temp came down some, but still 100.36F. Will message for Tylenol PRN.

## 2022-06-24 NOTE — Progress Notes (Signed)
Initial Nutrition Assessment  INTERVENTION:   -Will monitor for needs pending plan of care  -Pt declines ONS at this time given abdominal pain  NUTRITION DIAGNOSIS:   Increased nutrient needs related to cancer and cancer related treatments as evidenced by estimated needs.  GOAL:   Patient will meet greater than or equal to 90% of their needs  MONITOR:   PO intake, Labs, Weight trends, I & O's  REASON FOR ASSESSMENT:   Malnutrition Screening Tool    ASSESSMENT:   76 yo female who underwent a lap appy for a perforated appendicitis on 03/03/22.  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 Admitted with intra-abdominal infection.  Patient in room, no family at bedside. Pt pleasant to talk to but isn't able to give clear history. Pt reporting stomach pain at this time. States she ate grits and ice cream this morning and has felt sick ever since. Pt wants to avoid dairy at this point. Pt having smell aversions as well. States fast food she smelled in the ED made her feel like she needed to vomit.  Pt not interested in protein supplements at this time. Will check again if pain is more controlled.  Pt unable to state UBW.  RD weighed pt in bed, ~175 lbs.   Medications: NaCl w/ KCl infusion, Maalox   Labs reviewed.  NUTRITION - FOCUSED PHYSICAL EXAM:  No depletions noted.  Diet Order:   Diet Order             Diet full liquid Room service appropriate? Yes; Fluid consistency: Thin  Diet effective now                   EDUCATION NEEDS:   No education needs have been identified at this time  Skin:  Skin Assessment: Reviewed RN Assessment  Last BM:  11/2 -type 6  Height:   Ht Readings from Last 1 Encounters:  03/03/22 '5\' 6"'$  (1.676 m)    Weight:   Wt Readings from Last 1 Encounters:  06/22/22 83.9 kg    BMI:  Body mass index is 29.85 kg/m.  Estimated Nutritional Needs:   Kcal:  1500-1700  Protein:  60-70g  Fluid:   1.7L/day  Clayton Bibles, MS, RD, LDN Inpatient Clinical Dietitian Contact information available via Amion

## 2022-06-24 NOTE — Progress Notes (Addendum)
Subjective: Patient feels well this morning.  No pain, no further N/V.  Moving her bowels.  Temp overnight of 101.2.  tolerating full liquids with no issues  ROS: See above, otherwise other systems negative  Objective: Vital signs in last 24 hours: Temp:  [98 F (36.7 C)-101.2 F (38.4 C)] 98.7 F (37.1 C) (11/02 0605) Pulse Rate:  [72-93] 72 (11/02 0605) Resp:  [16-29] 18 (11/02 0605) BP: (115-139)/(56-75) 118/67 (11/02 0605) SpO2:  [93 %-100 %] 100 % (11/02 0605) Last BM Date : 06/24/22  Intake/Output from previous day: 11/01 0701 - 11/02 0700 In: 3550.7 [P.O.:720; I.V.:2422.4; IV Piggyback:408.3] Out: 1000 [Urine:300; Stool:700] Intake/Output this shift: Total I/O In: 0  Out: 500 [Urine:500]  PE: Heart: regular Lungs: CTAB Abd: soft, essentially nontender, but maybe slightly in RLQ, +BS  Lab Results:  Recent Labs    06/23/22 0556 06/24/22 0802  WBC 17.2* 14.8*  HGB 9.4* 8.7*  HCT 29.1* 28.2*  PLT 223 223   BMET Recent Labs    06/23/22 0556 06/24/22 0802  NA 136 139  K 3.8 4.2  CL 106 109  CO2 24 21*  GLUCOSE 116* 94  BUN 20 18  CREATININE 0.97 0.89  CALCIUM 8.7* 8.6*   PT/INR No results for input(s): "LABPROT", "INR" in the last 72 hours. CMP     Component Value Date/Time   NA 139 06/24/2022 0802   NA 143 06/28/2018 1550   NA 143 09/23/2016 1426   K 4.2 06/24/2022 0802   K 4.5 09/23/2016 1426   CL 109 06/24/2022 0802   CO2 21 (L) 06/24/2022 0802   CO2 29 09/23/2016 1426   GLUCOSE 94 06/24/2022 0802   GLUCOSE 95 09/23/2016 1426   BUN 18 06/24/2022 0802   BUN 21 06/28/2018 1550   BUN 21.8 09/23/2016 1426   CREATININE 0.89 06/24/2022 0802   CREATININE 1.00 10/17/2019 1107   CREATININE 1.1 09/23/2016 1426   CALCIUM 8.6 (L) 06/24/2022 0802   CALCIUM 10.0 09/23/2016 1426   PROT 8.2 (H) 06/22/2022 1742   PROT 6.8 06/28/2018 1550   PROT 7.4 09/23/2016 1426   ALBUMIN 4.1 06/22/2022 1742   ALBUMIN 4.3 06/28/2018 1550   ALBUMIN 4.0  09/23/2016 1426   AST 26 06/22/2022 1742   AST 19 10/17/2019 1107   AST 23 09/23/2016 1426   ALT 16 06/22/2022 1742   ALT 16 10/17/2019 1107   ALT 19 09/23/2016 1426   ALKPHOS 154 (H) 06/22/2022 1742   ALKPHOS 151 (H) 09/23/2016 1426   BILITOT 1.1 06/22/2022 1742   BILITOT 0.6 10/17/2019 1107   BILITOT 0.58 09/23/2016 1426   GFRNONAA >60 06/24/2022 0802   GFRNONAA 57 (L) 10/17/2019 1107   GFRAA >60 10/17/2019 1107   Lipase     Component Value Date/Time   LIPASE 25 06/22/2022 1742       Studies/Results: CT ABDOMEN PELVIS W CONTRAST  Result Date: 06/22/2022 CLINICAL DATA:  Abdominal pain, post-op. Appendectomy 1 month prior, purulent drainage from umbilicus, periumbilical abdominal pain, vomiting. EXAM: CT ABDOMEN AND PELVIS WITH CONTRAST TECHNIQUE: Multidetector CT imaging of the abdomen and pelvis was performed using the standard protocol following bolus administration of intravenous contrast. RADIATION DOSE REDUCTION: This exam was performed according to the departmental dose-optimization program which includes automated exposure control, adjustment of the mA and/or kV according to patient size and/or use of iterative reconstruction technique. CONTRAST:  141m OMNIPAQUE IOHEXOL 300 MG/ML  SOLN COMPARISON:  03/02/2022 FINDINGS: Lower chest:  No acute abnormality. Hepatobiliary: Multiple bilobar hypoenhancing masses have developed in the liver most in keeping with multiple hepatic metastases. Index lesion within the right hepatic lobe measures 2.8 x 4.4 cm at axial image # 32/2. No intra or extrahepatic biliary ductal dilation. Gallbladder unremarkable. Pancreas: Unremarkable Spleen: Unremarkable Adrenals/Urinary Tract: The adrenal glands are unremarkable. The kidneys are normal in size and position. Simple cortical cysts are seen within the kidneys bilaterally. No follow-up imaging is recommended for these lesions. The kidneys are otherwise unremarkable. Bladder is decompressed and is  unremarkable. Stomach/Bowel: Interval appendectomy. There is interstitial and extraluminal gas seen surrounding the surgical staples at the resection margin in keeping with breakdown of the resection margin and bowel perforation in this location. There is extensive cecal wall thickening in this region as well as circumferential bowel wall thickening involving the terminal ileum in this region, possibly reactive in nature. There is associated pathologic adenopathy involving the ileocolic lymph nodes which measure up to 15 mm in short axis diameter at axial image # 58/2. Together, this raises the question of malignant infiltration in the setting of an underlying colonic or appendiceal malignancy and associated wound breakdown at the surgical margin. No loculated intra-abdominal fluid collection is identified. Extensive infiltrative changes seen within the pericecal soft tissues. No free intraperitoneal fluid or gross free intraperitoneal gas identified. The stomach, small bowel, and large bowel are otherwise unremarkable. No evidence of obstruction. Vascular/Lymphatic: No significant vascular findings are present. No enlarged abdominal or pelvic lymph nodes. Reproductive: Status post hysterectomy. No adnexal masses. Other: Tiny fat containing umbilical hernia. No associated fluid collection. Musculoskeletal: Degenerative changes are seen within the lumbar spine. No acute bone abnormality. IMPRESSION: 1. Interval appendectomy. Extensive interstitial and extraluminal gas seen surrounding the surgical staples at the resection margin in keeping with breakdown of the resection margin and bowel perforation in this location. Extensive cecal wall thickening in this region as well as circumferential bowel wall thickening involving the terminal ileum in this region, possibly reactive in nature. Associated pathologic adenopathy involving the ileocolic lymph nodes. Together, this raises the question of malignant infiltration in  the setting of an underlying colonic or appendiceal malignancy and associated wound breakdown at the surgical margin. No loculated intra-abdominal fluid collection identified. 2. Interval development of multiple bilobar hypoenhancing masses in keeping with multiple hepatic metastases. Electronically Signed   By: Fidela Salisbury M.D.   On: 06/22/2022 22:09    Anti-infectives: Anti-infectives (From admission, onward)    Start     Dose/Rate Route Frequency Ordered Stop   06/23/22 1200  ceFEPIme (MAXIPIME) 2 g in sodium chloride 0.9 % 100 mL IVPB        2 g 200 mL/hr over 30 Minutes Intravenous Every 12 hours 06/22/22 2355     06/23/22 1000  metroNIDAZOLE (FLAGYL) IVPB 500 mg        500 mg 100 mL/hr over 60 Minutes Intravenous Every 12 hours 06/23/22 0957     06/22/22 2230  ceFEPIme (MAXIPIME) 2 g in sodium chloride 0.9 % 100 mL IVPB        2 g 200 mL/hr over 30 Minutes Intravenous  Once 06/22/22 2225 06/22/22 2347   06/22/22 2230  metroNIDAZOLE (FLAGYL) IVPB 500 mg        500 mg 100 mL/hr over 60 Minutes Intravenous  Once 06/22/22 2225 06/23/22 0019        Assessment/Plan H/O low grade appendiceal mucinous neoplasm with possible mets vs infection secondary to breakdown of resection margin  at appendiceal stump -cont FLD today -d/w Dr. Clovis Riley at [facility].  He thinks these findings may all be secondary to infection and not mets. -will plan for follow up CT tomorrow with a liver protocol as well to assess liver lesions as well as abscess/air-fluid collection in RLQ -cont abx therapy -WBC is improving  FEN - FLD/IVFs VTE - lovenox ID - Maxipime/Flagyl  I reviewed last 24 h vitals and pain scores, last 48 h intake and output, last 24 h labs and trends, and last 24 h imaging results.   LOS: 2 days    Henreitta Cea , [facility] 06/24/2022, 8:59 AM Please see Amion for pager number during day hours 7:00am-4:30pm or 7:00am -11:30am on weekends

## 2022-06-24 NOTE — TOC Initial Note (Signed)
Transition of Care [facility]) - Initial/Assessment Note    Patient Details  Name: Christina [name] MRN: [id number] Date of Birth: 04/19/1946  Transition of Care [facility]) CM/SW Contact:    Vassie Moselle, LCSW Phone Number: 06/24/2022, 12:54 PM  Clinical Narrative:                 [facility] consulted for SDOH concerns. Pt screened for food and housing insecurity. Met with pt who shares that she had struggled with obtaining food in the past however, now receives SNAP benefits and no longer has issues with obtaining food. Pt does share she lives with her daughter in section 8 housing. She reports that the rent is very high and her daughter pays for all of the rent on her own. Pt is agreeable to having resources added to her discharge paperwork for financial assistance. Resources have been added to AVS.   Expected Discharge Plan: Home/Self Care Barriers to Discharge: No Barriers Identified   Patient Goals and CMS Choice Patient states their goals for this hospitalization and ongoing recovery are:: To return home CMS Medicare.gov Compare Post Acute Care list provided to:: Patient Choice offered to / list presented to : Patient  Expected Discharge Plan and Services Expected Discharge Plan: Home/Self Care In-house Referral: NA Discharge Planning Services: CM Consult Post Acute Care Choice: NA Living arrangements for the past 2 months: Apartment                 DME Arranged: N/A DME Agency: NA                  Prior Living Arrangements/Services Living arrangements for the past 2 months: Apartment Lives with:: Adult Children Patient language and need for interpreter reviewed:: Yes Do you feel safe going back to the place where you live?: Yes      Need for Family Participation in Patient Care: No (Comment) Care giver support system in place?: Yes (comment) Current home services: DME Criminal Activity/Legal Involvement Pertinent to Current Situation/Hospitalization: No - Comment as  needed  Activities of Daily Living Home Assistive Devices/Equipment: Walker (specify type), Bedside commode/3-in-1 ADL Screening (condition at time of admission) Patient's cognitive ability adequate to safely complete daily activities?: Yes Is the patient deaf or have difficulty hearing?: No Does the patient have difficulty seeing, even when wearing glasses/contacts?: No Does the patient have difficulty concentrating, remembering, or making decisions?: No Patient able to express need for assistance with ADLs?: Yes Does the patient have difficulty dressing or bathing?: No Independently performs ADLs?: Yes (appropriate for developmental age) Does the patient have difficulty walking or climbing stairs?: No Weakness of Legs: Both Weakness of Arms/Hands: Both  Permission Sought/Granted   Permission granted to share information with : No              Emotional Assessment Appearance:: Appears stated age Attitude/Demeanor/Rapport: Engaged Affect (typically observed): Accepting Orientation: : Oriented to Self, Oriented to Place, Oriented to  Time, Oriented to Situation Alcohol / Substance Use: Not Applicable Psych Involvement: No (comment)  Admission diagnosis:  Bowel perforation (HCC) [K63.1] Postoperative leak [T81.89XA] Malignant neoplasm metastatic to other site [facility]) [C79.89] Patient Active Problem List   Diagnosis Date Noted   Postoperative leak 06/22/2022   Acute appendicitis 03/03/2022   AKI (acute kidney injury) ([place]) 03/03/2022   Prolonged QT interval 03/03/2022   Severe sepsis ([place])    History of COVID-19 09/22/2020   Vitamin D deficiency 06/29/2018   Malignant neoplasm of lower-inner quadrant of left  breast in female, estrogen receptor positive ([place]) 10/20/2015   Malignant neoplasm of upper-outer quadrant of right breast in female, estrogen receptor positive ([place]) 06/23/2015   CONSTIPATION 04/01/2010   PALPITATIONS 04/01/2010   URINALYSIS, ABNORMAL 04/01/2010    ANEMIA 05/22/2009   DENTAL CARIES 04/21/2009   Elevated LDL cholesterol level 03/18/2008   Essential hypertension 08/23/2006   PCP:  Andria Frames, PA-C Pharmacy:   [facility] [place] [address] Phone: [phone] Fax: [phone]  [id number] [place] [id number]: [phone]: [phone]    Social Determinants of Health (SDOH) Interventions Food Insecurity Interventions: Intervention Not Indicated Housing Interventions: Other (Comment) (Provided with community resources)  Readmission Risk Interventions    06/24/2022   12:52 PM 03/06/2022   10:56 AM  Readmission Risk Prevention Plan  Transportation Screening Complete Complete  PCP or Specialist Appt within 5-7 Days  Complete  PCP or Specialist Appt within 3-5 Days Complete   Home Care Screening  Complete  Medication Review (RN CM)  Complete  HRI or Home Care Consult Complete   Social Work Consult for [place]/Counseling Complete   Palliative Care Screening Not Applicable   Medication Review (Press photographer Complete

## 2022-06-24 NOTE — Progress Notes (Signed)
Patient temp of 101.63F. Notified by NT Pamala Hurry. Patient used IS, given ice, room was warm so heat was turned down. Also, IV antibiotics were given.

## 2022-06-24 NOTE — Progress Notes (Signed)
Pharmacy Antibiotic Note  Christina [name] is a 76 y.o. female admitted on 06/22/2022 with  Intraabdominal infection .  Pharmacy has been consulted for Cefepime dosing.  ID: Intraabdominal infection. - Tmax 101.2. currently afebrile. WBC 14.8. Scr <1  Cefepime 10/31>>  10/31: BC x 1>>  Plan: Con't Cefepime 2g IV q12. Dose ok for renal function. Pharmacy will sign off. Please reconsult for further dosing assitance.     Weight: 83.9 kg (184 lb 15.5 oz)  Temp (24hrs), Avg:99.4 F (37.4 C), Min:98 F (36.7 C), Max:101.2 F (38.4 C)  Recent Labs  Lab 06/22/22 1742 06/22/22 2251 06/23/22 0556 06/23/22 1716 06/24/22 0802  WBC 22.9*  --  17.2*  --  14.8*  CREATININE 1.05*  --  0.97  --   --   LATICACIDVEN  --  1.4  --  1.3  --     Estimated Creatinine Clearance: 55.5 mL/min (by C-G formula based on SCr of 0.97 mg/dL).    Allergies  Allergen Reactions   Onion Nausea And Vomiting    White onion   Simvastatin Other (See Comments)    myalgia   Aspirin Nausea Only and Nausea And Vomiting   Hydrocodone Rash    With itching    Akiko Schexnider S. Alford Highland, PharmD, BCPS Clinical Staff Pharmacist [email or web address]  Wayland Salinas 06/24/2022 8:22 AM

## 2022-06-25 ENCOUNTER — Inpatient Hospital Stay (HOSPITAL_COMMUNITY): Payer: Medicare Other

## 2022-06-25 LAB — BASIC METABOLIC PANEL
Anion gap: 8 (ref 5–15)
BUN: 12 mg/dL (ref 8–23)
CO2: 21 mmol/L — ABNORMAL LOW (ref 22–32)
Calcium: 8 mg/dL — ABNORMAL LOW (ref 8.9–10.3)
Chloride: 107 mmol/L (ref 98–111)
Creatinine, Ser: 0.72 mg/dL (ref 0.44–1.00)
GFR, Estimated: >60 mL/min (ref >60)
Glucose, Bld: 97 mg/dL (ref 70–99)
Potassium: 3.7 mmol/L (ref 3.5–5.1)
Sodium: 136 mmol/L (ref 135–145)

## 2022-06-25 LAB — CBC
HCT: 26.3 % — ABNORMAL LOW (ref 36.0–46.0)
Hemoglobin: 8.3 g/dL — ABNORMAL LOW (ref 12.0–15.0)
MCH: 26.4 pg (ref 26.0–34.0)
MCHC: 31.6 g/dL (ref 30.0–36.0)
MCV: 83.8 fL (ref 80.0–100.0)
Platelets: 203 10*3/uL (ref 150–400)
RBC: 3.14 MIL/uL — ABNORMAL LOW (ref 3.87–5.11)
RDW: 13.2 % (ref 11.5–15.5)
WBC: 9.4 10*3/uL (ref 4.0–10.5)
nRBC: 0 % (ref 0.0–0.2)

## 2022-06-25 MED ORDER — IOHEXOL 300 MG/ML  SOLN
100.0000 mL | Freq: Once | INTRAMUSCULAR | Status: AC | PRN
  Administered 2022-06-25: 100 mL via INTRAVENOUS

## 2022-06-25 MED ORDER — IOHEXOL 9 MG/ML PO SOLN
500.0000 mL | ORAL | Status: AC
  Administered 2022-06-25: 500 mL via ORAL

## 2022-06-25 MED ORDER — IOHEXOL 9 MG/ML PO SOLN
ORAL | Status: AC
  Filled 2022-06-25: qty 1000

## 2022-06-25 MED ORDER — SODIUM CHLORIDE 0.9 % IV SOLN
2.0000 g | Freq: Three times a day (TID) | INTRAVENOUS | Status: AC
  Administered 2022-06-25 – 2022-06-29 (×14): 2 g via INTRAVENOUS
  Filled 2022-06-25 (×14): qty 12.5

## 2022-06-25 MED ORDER — SODIUM CHLORIDE (PF) 0.9 % IJ SOLN
INTRAMUSCULAR | Status: AC
  Filled 2022-06-25: qty 50

## 2022-06-25 NOTE — Progress Notes (Signed)
Subjective: Having a little bit more discomfort this am, but overall still feeling well.    ROS: See above, otherwise other systems negative  Objective: Vital signs in last 24 hours: Temp:  [98.5 F (36.9 C)-100.1 F (37.8 C)] 98.8 F (37.1 C) (11/03 0522) Pulse Rate:  [84-93] 84 (11/03 0522) Resp:  [16-17] 17 (11/03 0522) BP: (102-138)/(54-92) 134/72 (11/03 0522) SpO2:  [96 %-100 %] 99 % (11/03 0522) Last BM Date : 06/24/22  Intake/Output from previous day: 11/02 0701 - 11/03 0700 In: 2805.3 [P.O.:780; I.V.:1625.3; IV Piggyback:400.1] Out: 750 [Urine:750] Intake/Output this shift: No intake/output data recorded.  PE: Heart: regular Lungs: CTAB Abd: soft, essentially nontender, but maybe slightly in RLQ, +BS  Lab Results:  Recent Labs    06/24/22 0802 06/25/22 0505  WBC 14.8* 9.4  HGB 8.7* 8.3*  HCT 28.2* 26.3*  PLT 223 203   BMET Recent Labs    06/24/22 0802 06/25/22 0649  NA 139 136  K 4.2 3.7  CL 109 107  CO2 21* 21*  GLUCOSE 94 97  BUN 18 12  CREATININE 0.89 0.72  CALCIUM 8.6* 8.0*   PT/INR No results for input(s): "LABPROT", "INR" in the last 72 hours. CMP     Component Value Date/Time   NA 136 06/25/2022 0649   NA 143 06/28/2018 1550   NA 143 09/23/2016 1426   K 3.7 06/25/2022 0649   K 4.5 09/23/2016 1426   CL 107 06/25/2022 0649   CO2 21 (L) 06/25/2022 0649   CO2 29 09/23/2016 1426   GLUCOSE 97 06/25/2022 0649   GLUCOSE 95 09/23/2016 1426   BUN 12 06/25/2022 0649   BUN 21 06/28/2018 1550   BUN 21.8 09/23/2016 1426   CREATININE 0.72 06/25/2022 0649   CREATININE 1.00 10/17/2019 1107   CREATININE 1.1 09/23/2016 1426   CALCIUM 8.0 (L) 06/25/2022 0649   CALCIUM 10.0 09/23/2016 1426   PROT 8.2 (H) 06/22/2022 1742   PROT 6.8 06/28/2018 1550   PROT 7.4 09/23/2016 1426   ALBUMIN 4.1 06/22/2022 1742   ALBUMIN 4.3 06/28/2018 1550   ALBUMIN 4.0 09/23/2016 1426   AST 26 06/22/2022 1742   AST 19 10/17/2019 1107   AST 23 09/23/2016  1426   ALT 16 06/22/2022 1742   ALT 16 10/17/2019 1107   ALT 19 09/23/2016 1426   ALKPHOS 154 (H) 06/22/2022 1742   ALKPHOS 151 (H) 09/23/2016 1426   BILITOT 1.1 06/22/2022 1742   BILITOT 0.6 10/17/2019 1107   BILITOT 0.58 09/23/2016 1426   GFRNONAA >60 06/25/2022 0649   GFRNONAA 57 (L) 10/17/2019 1107   GFRAA >60 10/17/2019 1107   Lipase     Component Value Date/Time   LIPASE 25 06/22/2022 1742       Studies/Results: No results found.  Anti-infectives: Anti-infectives (From admission, onward)    Start     Dose/Rate Route Frequency Ordered Stop   06/23/22 1200  ceFEPIme (MAXIPIME) 2 g in sodium chloride 0.9 % 100 mL IVPB        2 g 200 mL/hr over 30 Minutes Intravenous Every 12 hours 06/22/22 2355     06/23/22 1000  metroNIDAZOLE (FLAGYL) IVPB 500 mg        500 mg 100 mL/hr over 60 Minutes Intravenous Every 12 hours 06/23/22 0957     06/22/22 2230  ceFEPIme (MAXIPIME) 2 g in sodium chloride 0.9 % 100 mL IVPB        2 g 200 mL/hr  over 30 Minutes Intravenous  Once 06/22/22 2225 06/22/22 2347   06/22/22 2230  metroNIDAZOLE (FLAGYL) IVPB 500 mg        500 mg 100 mL/hr over 60 Minutes Intravenous  Once 06/22/22 2225 06/23/22 0019        Assessment/Plan H/O low grade appendiceal mucinous neoplasm with possible mets vs infection secondary to breakdown of resection margin at appendiceal stump -cont FLD today, repeat CT scan today  -d/[facility] Clovis Riley at [facility].  He thinks these findings may all be secondary to infection and not mets. -cont abx therapy -WBC is improving and normalized today  FEN - FLD/IVFs VTE - lovenox ID - Maxipime/Flagyl  I reviewed last 24 h vitals and pain scores, last 48 h intake and output, last 24 h labs and trends, and last 24 h imaging results.   LOS: 3 days    Henreitta Cea , [facility] 06/25/2022, 8:34 AM Please see Amion for pager number during day hours 7:00am-4:30pm or 7:00am -11:30am on weekends

## 2022-06-25 NOTE — Progress Notes (Signed)
Mobility Specialist - Progress Note   06/25/22 1121  Mobility  Activity Ambulated with assistance in hallway  Level of Assistance Standby assist, set-up cues, supervision of patient - no hands on  Assistive Device Front wheel walker  Distance Ambulated (ft) 60 ft  Activity Response Tolerated well  Mobility Referral Yes  $Mobility charge 1 Mobility   Pt received in bed and agreeable to mobility. C/o feeling dizzy while ambulating which prompted in pt being wheeled back into room in chair. Pt to bed after session with all needs met.     [facility]

## 2022-06-25 NOTE — Progress Notes (Signed)
Pharmacy Antibiotic Note  Christina [name] is a 76 y.o. female admitted on 06/22/2022 with  Intraabdominal infection .  Pharmacy has been consulted for Cefepime dosing.  Plan: Cefepime 2g IV q8h Pharmacy will sign off. Please reconsult for further dosing assitance.    Weight: 83.9 kg (184 lb 15.5 oz)  Temp (24hrs), Avg:99 F (37.2 C), Min:98.5 F (36.9 C), Max:100.1 F (37.8 C)  Recent Labs  Lab 06/22/22 1742 06/22/22 2251 06/23/22 0556 06/23/22 1716 06/24/22 0802 06/25/22 0505 06/25/22 0649  WBC 22.9*  --  17.2*  --  14.8* 9.4  --   CREATININE 1.05*  --  0.97  --  0.89  --  0.72  LATICACIDVEN  --  1.4  --  1.3  --   --   --      Estimated Creatinine Clearance: 67.3 mL/min (by C-G formula based on SCr of 0.72 mg/dL).    Allergies  Allergen Reactions   Onion Nausea And Vomiting    White onion   Simvastatin Other (See Comments)    myalgia   Aspirin Nausea Only and Nausea And Vomiting   Hydrocodone Rash    With itching       Gretta Arab PharmD, BCPS [facility] main pharmacy [phone] 06/25/2022 11:02 AM

## 2022-06-25 NOTE — Care Management Important Message (Signed)
Important Message  Patient Details IM Letter placed in Patients room. Name: Christina [name] MRN: [id number] Date of Birth: 1945/09/14   Medicare Important Message Given:  Yes     [name] 06/25/2022, 9:50 AM

## 2022-06-26 ENCOUNTER — Inpatient Hospital Stay (HOSPITAL_COMMUNITY): Payer: Medicare Other

## 2022-06-26 LAB — CBC
HCT: 26.6 % — ABNORMAL LOW (ref 36.0–46.0)
Hemoglobin: 8.5 g/dL — ABNORMAL LOW (ref 12.0–15.0)
MCH: 26.3 pg (ref 26.0–34.0)
MCHC: 32 g/dL (ref 30.0–36.0)
MCV: 82.4 fL (ref 80.0–100.0)
Platelets: 255 10*3/uL (ref 150–400)
RBC: 3.23 MIL/uL — ABNORMAL LOW (ref 3.87–5.11)
RDW: 13.2 % (ref 11.5–15.5)
WBC: 8.3 10*3/uL (ref 4.0–10.5)
nRBC: 0 % (ref 0.0–0.2)

## 2022-06-26 LAB — BASIC METABOLIC PANEL
Anion gap: 8 (ref 5–15)
BUN: 9 mg/dL (ref 8–23)
CO2: 22 mmol/L (ref 22–32)
Calcium: 8.2 mg/dL — ABNORMAL LOW (ref 8.9–10.3)
Chloride: 105 mmol/L (ref 98–111)
Creatinine, Ser: 0.73 mg/dL (ref 0.44–1.00)
GFR, Estimated: >60 mL/min (ref >60)
Glucose, Bld: 108 mg/dL — ABNORMAL HIGH (ref 70–99)
Potassium: 3.4 mmol/L — ABNORMAL LOW (ref 3.5–5.1)
Sodium: 135 mmol/L (ref 135–145)

## 2022-06-26 NOTE — Progress Notes (Signed)
Subjective: overall still feeling well.    Objective: Vital signs in last 24 hours: Temp:  [99.3 F (37.4 C)-99.9 F (37.7 C)] 99.9 F (37.7 C) (11/04 0507) Pulse Rate:  [82-85] 83 (11/04 0507) Resp:  [16-18] 16 (11/04 0507) BP: (120-130)/(66-69) 130/69 (11/04 0507) SpO2:  [96 %-99 %] 96 % (11/04 0507) Last BM Date : 06/24/22  Intake/Output from previous day: 11/03 0701 - 11/04 0700 In: 1662 [I.V.:1362; IV Piggyback:300] Out: 300 [Urine:300] Intake/Output this shift: No intake/output data recorded.  PE: NAD Abd: soft, nontender Lab Results:  Recent Labs    06/25/22 0505 06/26/22 0532  WBC 9.4 8.3  HGB 8.3* 8.5*  HCT 26.3* 26.6*  PLT 203 255    BMET Recent Labs    06/25/22 0649 06/26/22 0532  NA 136 135  K 3.7 3.4*  CL 107 105  CO2 21* 22  GLUCOSE 97 108*  BUN 12 9  CREATININE 0.72 0.73  CALCIUM 8.0* 8.2*    PT/INR No results for input(s): "LABPROT", "INR" in the last 72 hours. CMP     Component Value Date/Time   NA 135 06/26/2022 0532   NA 143 06/28/2018 1550   NA 143 09/23/2016 1426   K 3.4 (L) 06/26/2022 0532   K 4.5 09/23/2016 1426   CL 105 06/26/2022 0532   CO2 22 06/26/2022 0532   CO2 29 09/23/2016 1426   GLUCOSE 108 (H) 06/26/2022 0532   GLUCOSE 95 09/23/2016 1426   BUN 9 06/26/2022 0532   BUN 21 06/28/2018 1550   BUN 21.8 09/23/2016 1426   CREATININE 0.73 06/26/2022 0532   CREATININE 1.00 10/17/2019 1107   CREATININE 1.1 09/23/2016 1426   CALCIUM 8.2 (L) 06/26/2022 0532   CALCIUM 10.0 09/23/2016 1426   PROT 8.2 (H) 06/22/2022 1742   PROT 6.8 06/28/2018 1550   PROT 7.4 09/23/2016 1426   ALBUMIN 4.1 06/22/2022 1742   ALBUMIN 4.3 06/28/2018 1550   ALBUMIN 4.0 09/23/2016 1426   AST 26 06/22/2022 1742   AST 19 10/17/2019 1107   AST 23 09/23/2016 1426   ALT 16 06/22/2022 1742   ALT 16 10/17/2019 1107   ALT 19 09/23/2016 1426   ALKPHOS 154 (H) 06/22/2022 1742   ALKPHOS 151 (H) 09/23/2016 1426   BILITOT 1.1 06/22/2022  1742   BILITOT 0.6 10/17/2019 1107   BILITOT 0.58 09/23/2016 1426   GFRNONAA >60 06/26/2022 0532   GFRNONAA 57 (L) 10/17/2019 1107   GFRAA >60 10/17/2019 1107   Lipase     Component Value Date/Time   LIPASE 25 06/22/2022 1742       Studies/Results: CT ABD PELVIS W/WO CM ONCOLOGY LIVER PROTOCOL  Result Date: 06/25/2022 CLINICAL DATA:  Appendiceal mucinous neoplasm. Recent appendectomy. Follow-up metastatic disease and postop inflammatory disease. * Tracking Code: BO * EXAM: CT ABDOMEN AND PELVIS WITHOUT AND WITH CONTRAST TECHNIQUE: Multidetector CT imaging of the abdomen and pelvis was performed following the standard protocol before and following the bolus administration of intravenous contrast. RADIATION DOSE REDUCTION: This exam was performed according to the departmental dose-optimization program which includes automated exposure control, adjustment of the mA and/or kV according to patient size and/or use of iterative reconstruction technique. CONTRAST:  136m OMNIPAQUE IOHEXOL 300 MG/ML  SOLN COMPARISON:  06/22/2022 FINDINGS: Lower Chest: No acute findings. Hepatobiliary: Multiple hypovascular metastases are again seen throughout the liver, without significant change compared to recent exam. Gallbladder is unremarkable. No evidence of biliary ductal dilatation. Pancreas:  No mass  or inflammatory changes. Spleen: Within normal limits in size and appearance. Adrenals/Urinary Tract: No suspicious masses identified. No evidence of ureteral calculi or hydronephrosis. Stomach/Bowel: Diverticulosis is seen mainly involving the descending and sigmoid colon, however there is no evidence of diverticulitis. Mild diffuse wall thickening throughout the rectum and sigmoid colon is suspicious for proctocolitis. Postop changes are again seen from appendectomy. A focal 2.4 cm gas collection adjacent to surgical staples and base of cecum remains stable. No evidence of extravasation of oral contrast or free  intraperitoneal air. No evidence of bowel obstruction. Nodular wall thickening is again seen involving the base of the cecum and terminal ileum, which is suspicious for tumor. Vascular/Lymphatic: Lymphadenopathy is again seen in the right abdominal mesentery, with largest lymph node measuring 2.1 cm on image 92/11, compared to 1.5 cm previously. This is highly suspicious for metastatic disease. No acute vascular findings. Reproductive: Prior hysterectomy noted. Adnexal regions are unremarkable in appearance. Other:  None. Musculoskeletal:  No suspicious bone lesions identified. IMPRESSION: Postop changes from appendectomy, with stable 2.4 cm gas collection adjacent to surgical staples and base of cecum. No evidence of contrast leak or free intraperitoneal air. Persistent nodular wall thickening involving the base of the cecum and terminal ileum, highly suspicious for tumor. Mild increase in right lower quadrant mesenteric lymphadenopathy, highly suspicious for metastatic disease. Diffuse hepatic metastatic disease, without significant change. Mild diffuse wall thickening throughout the rectum and sigmoid colon, suspicious for proctocolitis. Colonic diverticulosis, without radiographic evidence of diverticulitis. Electronically Signed   By: Marlaine Hind M.D.   On: 06/25/2022 15:47    Anti-infectives: Anti-infectives (From admission, onward)    Start     Dose/Rate Route Frequency Ordered Stop   06/25/22 1200  ceFEPIme (MAXIPIME) 2 g in sodium chloride 0.9 % 100 mL IVPB        2 g 200 mL/hr over 30 Minutes Intravenous Every 8 hours 06/25/22 1104     06/23/22 1200  ceFEPIme (MAXIPIME) 2 g in sodium chloride 0.9 % 100 mL IVPB  Status:  Discontinued        2 g 200 mL/hr over 30 Minutes Intravenous Every 12 hours 06/22/22 2355 06/25/22 1104   06/23/22 1000  metroNIDAZOLE (FLAGYL) IVPB 500 mg        500 mg 100 mL/hr over 60 Minutes Intravenous Every 12 hours 06/23/22 0957     06/22/22 2230  ceFEPIme  (MAXIPIME) 2 g in sodium chloride 0.9 % 100 mL IVPB        2 g 200 mL/hr over 30 Minutes Intravenous  Once 06/22/22 2225 06/22/22 2347   06/22/22 2230  metroNIDAZOLE (FLAGYL) IVPB 500 mg        500 mg 100 mL/hr over 60 Minutes Intravenous  Once 06/22/22 2225 06/23/22 0019        Assessment/Plan H/O low grade appendiceal mucinous neoplasm with possible mets vs infection secondary to breakdown of resection margin at appendiceal stump -soft diet -d/w Dr. Clovis Riley at [facility].  He thinks these findings may all be secondary to infection and not mets.  Will need liver bx -cont abx therapy -WBC is normalized   FEN - FLD/IVFs VTE - lovenox ID - Maxipime/Flagyl  I reviewed last 24 h vitals and pain scores, last 48 h intake and output, last 24 h labs and trends, and last 24 h imaging results.   LOS: 4 days    Rosario Adie, MD  [facility] [place] [facility]

## 2022-06-27 LAB — BASIC METABOLIC PANEL
Anion gap: 7 (ref 5–15)
BUN: 8 mg/dL (ref 8–23)
CO2: 23 mmol/L (ref 22–32)
Calcium: 8.5 mg/dL — ABNORMAL LOW (ref 8.9–10.3)
Chloride: 105 mmol/L (ref 98–111)
Creatinine, Ser: 0.66 mg/dL (ref 0.44–1.00)
GFR, Estimated: >60 mL/min (ref >60)
Glucose, Bld: 101 mg/dL — ABNORMAL HIGH (ref 70–99)
Potassium: 3.7 mmol/L (ref 3.5–5.1)
Sodium: 135 mmol/L (ref 135–145)

## 2022-06-27 LAB — CBC
HCT: 27.7 % — ABNORMAL LOW (ref 36.0–46.0)
Hemoglobin: 8.8 g/dL — ABNORMAL LOW (ref 12.0–15.0)
MCH: 26 pg (ref 26.0–34.0)
MCHC: 31.8 g/dL (ref 30.0–36.0)
MCV: 82 fL (ref 80.0–100.0)
Platelets: 282 10*3/uL (ref 150–400)
RBC: 3.38 MIL/uL — ABNORMAL LOW (ref 3.87–5.11)
RDW: 13.2 % (ref 11.5–15.5)
WBC: 9.3 10*3/uL (ref 4.0–10.5)
nRBC: 0 % (ref 0.0–0.2)

## 2022-06-27 MED ORDER — ENSURE ENLIVE PO LIQD: 237.0000 mL | Freq: Two times a day (BID) | ORAL | Status: DC

## 2022-06-27 NOTE — Progress Notes (Signed)
Mobility Specialist - Progress Note   06/27/22 1232  Mobility  Activity Ambulated with assistance in hallway  Level of Assistance Modified independent, requires aide device or extra time  Assistive Device Front wheel walker  Distance Ambulated (ft) 400 ft  Activity Response Tolerated well  Mobility Referral Yes  $Mobility charge 1 Mobility   Pt received in bed and agreed to mobility, no c/o pain nor discomfort during ambulation. Pt returned to bed with all needs met.   Roderick Pee Mobility Specialist

## 2022-06-27 NOTE — Consult Note (Signed)
Chief Complaint: Patient was seen In consultation today for liver lesions  Referring Physician(s): Dr. Rolm Bookbinder  Supervising Physician: Sandi Mariscal  Patient Status: [facility] - In-pt  History of Present Illness: Christina [name] is a 76 y.o. female with history of lap appendectomy in July which had positive pathology for low grade appendiceal mucinous neoplasm. She was referred to [facility] for further care. She presented to [facility] ED with abdominal pain and vomiting.   CT Abdomen Pelvis: IMPRESSION: 1. Interval appendectomy. Extensive interstitial and extraluminal gas seen surrounding the surgical staples at the resection margin in keeping with breakdown of the resection margin and bowel perforation in this location. Extensive cecal wall thickening in this region as well as circumferential bowel wall thickening involving the terminal ileum in this region, possibly reactive in nature. Associated pathologic adenopathy involving the ileocolic lymph nodes. Together, this raises the question of malignant infiltration in the setting of an underlying colonic or appendiceal malignancy and associated wound breakdown at the surgical margin. No loculated intra-abdominal fluid collection identified. 2. Interval development of multiple bilobar hypoenhancing masses in keeping with multiple hepatic metastases.  Due to concern for possible metastatic disease vs infection secondary to breakdown of her surgical stump IR consulted for liver lesion biopsy.  Case reviewed by Dr. Pascal Lux who approves patient for the procedure.   Past Medical History:  Diagnosis Date   Anemia    Breast cancer ([place]) 08/28/2015   Bilateral   Breast cancer of upper-outer quadrant of right female breast ([place]) 06/23/2015   High cholesterol    Hx of radiation therapy 11/05/15- 12/16/15   Right Breast and Left Breast   Hypertension    Personal history of radiation therapy    Right Breast Cancer   Vitamin D deficiency  06/29/2018    Past Surgical History:  Procedure Laterality Date   BREAST LUMPECTOMY Left 08/28/2015   BREAST LUMPECTOMY Right 08/28/2015   BREAST LUMPECTOMY WITH NEEDLE LOCALIZATION AND AXILLARY SENTINEL LYMPH NODE BX Bilateral 08/28/2015   Procedure: BILATERAL BREAST LUMPECTOMY WITH BILATERAL  NEEDLE LOCALIZATION AND BILATERAL AXILLARY SENTINEL LYMPH NODE BX;  Surgeon: Erroll Luna, MD;  Location: [place];  Service: General;  Laterality: Bilateral;   LAPAROSCOPIC APPENDECTOMY N/A 03/03/2022   Procedure: APPENDECTOMY LAPAROSCOPIC;  Surgeon: Erroll Luna, MD;  Location: [facility];  Service: General;  Laterality: N/A;    Allergies: Onion, Simvastatin, Aspirin, and Hydrocodone  Medications: Prior to Admission medications   Medication Sig Start Date End Date Taking? Authorizing Provider  acetaminophen (TYLENOL) 500 MG tablet Take 500 mg by mouth every 6 (six) hours as needed for fever.   Yes [provider]  amLODipine (NORVASC) 10 MG tablet Take 1 tablet (10 mg total) by mouth daily. - Dose change. 06/04/22  Yes   olmesartan (BENICAR) 20 MG tablet Take 1 tablet (20 mg total) by mouth daily. Patient taking differently: Take 20 mg by mouth at bedtime. 08/21/21  Yes   amLODipine (NORVASC) 10 MG tablet Take 1 tablet (10 mg total) by mouth daily. Patient not taking: Reported on 06/22/2022 04/28/22   Park Meo T, PA-C  fexofenadine (ALLEGRA) 60 MG tablet Take 1 tablet by mouth 2 times daily. Patient not taking: Reported on 06/22/2022 12/02/21   Raspet, Junie Panning K, PA-C  olopatadine (PATADAY) 0.1 % ophthalmic solution Place 1 drop into both eyes 2 times daily. Patient not taking: Reported on 06/22/2022 12/02/21   Raspet, Junie Panning K, PA-C  oxyCODONE (OXY IR/ROXICODONE) 5 MG immediate release tablet Take 1-2 tablets (  5-10 mg total) by mouth every 6 (six) hours as needed for moderate pain or severe pain. Patient not taking: Reported on 06/22/2022 03/06/22   Donne Kaylanie, MD   losartan-hydrochlorothiazide (HYZAAR) 100-25 MG tablet Take 1 tablet by mouth daily. 12/31/20 06/03/21       Family History  Problem Relation Age of Onset   Cancer Mother    Diabetes Sister    Lung cancer Brother    Diabetes Brother     Social History   Socioeconomic History   Marital status: Single    Spouse name: Not on file   Number of children: Not on file   Years of education: Not on file   Highest education level: Not on file  Occupational History   Not on file  Tobacco Use   Smoking status: Never   Smokeless tobacco: Never  Substance and Sexual Activity   Alcohol use: No   Drug use: No   Sexual activity: Never  Other Topics Concern   Not on file  Social History Narrative   2 children   Social Determinants of Health   Financial Resource Strain: Not on file  Food Insecurity: No Food Insecurity (06/24/2022)   Hunger Vital Sign    Worried About Running Out of Food in the Last Year: Never true    Ran Out of Food in the Last Year: Never true  Recent Concern: [place] Present (06/23/2022)   Hunger Vital Sign    Worried About [place] in the Last Year: Sometimes true    Ran Out of Food in the Last Year: Sometimes true  Transportation Needs: No Transportation Needs (06/23/2022)   PRAPARE - Hydrologist (Medical): No    Lack of Transportation (Non-Medical): No  Physical Activity: Not on file  Stress: Not on file  Social Connections: Not on file     Review of Systems: A 12 point ROS discussed and pertinent positives are indicated in the HPI above.  All other systems are negative.  Review of Systems  Constitutional:  Negative for fatigue and fever.  Respiratory:  Negative for cough and shortness of breath.   Cardiovascular:  Negative for chest pain.  Gastrointestinal:  Positive for abdominal pain and vomiting.  Musculoskeletal:  Negative for back pain.  Psychiatric/Behavioral:  Negative for  behavioral problems and confusion.     Vital Signs: BP 113/68 (BP Location: Right Arm)   Pulse 89   Temp 98.5 F (36.9 C) (Oral)   Resp 18   Ht '5\' 6"'$  (1.676 m)   Wt 184 lb 15.5 oz (83.9 kg)   SpO2 98%   BMI 29.85 kg/m   Physical Exam Vitals and nursing note reviewed.  Constitutional:      General: She is not in acute distress.    Appearance: Normal appearance. She is not ill-appearing.  HENT:     Mouth/Throat:     Mouth: Mucous membranes are moist.     Pharynx: Oropharynx is clear.  Cardiovascular:     Rate and Rhythm: Normal rate and regular rhythm.  Pulmonary:     Effort: Pulmonary effort is normal.     Breath sounds: Normal breath sounds.  Abdominal:     General: Abdomen is flat. There is no distension.     Palpations: Abdomen is soft.     Tenderness: There is no abdominal tenderness.  Skin:    General: Skin is warm and dry.  Neurological:  General: No focal deficit present.     Mental Status: She is alert and oriented to person, place, and time.  Psychiatric:        Mood and Affect: Mood normal.        Behavior: Behavior normal.        Thought Content: Thought content normal.        Judgment: Judgment normal.      MD Evaluation Airway: WNL Heart: WNL Abdomen: WNL Chest/ Lungs: WNL ASA  Classification: 3 Mallampati/Airway Score: Two   Imaging: CT ABD PELVIS W/WO CM ONCOLOGY LIVER PROTOCOL  Result Date: 06/25/2022 CLINICAL DATA:  Appendiceal mucinous neoplasm. Recent appendectomy. Follow-up metastatic disease and postop inflammatory disease. * Tracking Code: BO * EXAM: CT ABDOMEN AND PELVIS WITHOUT AND WITH CONTRAST TECHNIQUE: Multidetector CT imaging of the abdomen and pelvis was performed following the standard protocol before and following the bolus administration of intravenous contrast. RADIATION DOSE REDUCTION: This exam was performed according to the departmental dose-optimization program which includes automated exposure control, adjustment of  the mA and/or kV according to patient size and/or use of iterative reconstruction technique. CONTRAST:  146m OMNIPAQUE IOHEXOL 300 MG/ML  SOLN COMPARISON:  06/22/2022 FINDINGS: Lower Chest: No acute findings. Hepatobiliary: Multiple hypovascular metastases are again seen throughout the liver, without significant change compared to recent exam. Gallbladder is unremarkable. No evidence of biliary ductal dilatation. Pancreas:  No mass or inflammatory changes. Spleen: Within normal limits in size and appearance. Adrenals/Urinary Tract: No suspicious masses identified. No evidence of ureteral calculi or hydronephrosis. Stomach/Bowel: Diverticulosis is seen mainly involving the descending and sigmoid colon, however there is no evidence of diverticulitis. Mild diffuse wall thickening throughout the rectum and sigmoid colon is suspicious for proctocolitis. Postop changes are again seen from appendectomy. A focal 2.4 cm gas collection adjacent to surgical staples and base of cecum remains stable. No evidence of extravasation of oral contrast or free intraperitoneal air. No evidence of bowel obstruction. Nodular wall thickening is again seen involving the base of the cecum and terminal ileum, which is suspicious for tumor. Vascular/Lymphatic: Lymphadenopathy is again seen in the right abdominal mesentery, with largest lymph node measuring 2.1 cm on image 92/11, compared to 1.5 cm previously. This is highly suspicious for metastatic disease. No acute vascular findings. Reproductive: Prior hysterectomy noted. Adnexal regions are unremarkable in appearance. Other:  None. Musculoskeletal:  No suspicious bone lesions identified. IMPRESSION: Postop changes from appendectomy, with stable 2.4 cm gas collection adjacent to surgical staples and base of cecum. No evidence of contrast leak or free intraperitoneal air. Persistent nodular wall thickening involving the base of the cecum and terminal ileum, highly suspicious for tumor. Mild  increase in right lower quadrant mesenteric lymphadenopathy, highly suspicious for metastatic disease. Diffuse hepatic metastatic disease, without significant change. Mild diffuse wall thickening throughout the rectum and sigmoid colon, suspicious for proctocolitis. Colonic diverticulosis, without radiographic evidence of diverticulitis. Electronically Signed   By: JMarlaine HindM.D.   On: 06/25/2022 15:47   CT ABDOMEN PELVIS W CONTRAST  Result Date: 06/22/2022 CLINICAL DATA:  Abdominal pain, post-op. Appendectomy 1 month prior, purulent drainage from umbilicus, periumbilical abdominal pain, vomiting. EXAM: CT ABDOMEN AND PELVIS WITH CONTRAST TECHNIQUE: Multidetector CT imaging of the abdomen and pelvis was performed using the standard protocol following bolus administration of intravenous contrast. RADIATION DOSE REDUCTION: This exam was performed according to the departmental dose-optimization program which includes automated exposure control, adjustment of the mA and/or kV according to patient size and/or use of  iterative reconstruction technique. CONTRAST:  161m OMNIPAQUE IOHEXOL 300 MG/ML  SOLN COMPARISON:  03/02/2022 FINDINGS: Lower chest: No acute abnormality. Hepatobiliary: Multiple bilobar hypoenhancing masses have developed in the liver most in keeping with multiple hepatic metastases. Index lesion within the right hepatic lobe measures 2.8 x 4.4 cm at axial image # 32/2. No intra or extrahepatic biliary ductal dilation. Gallbladder unremarkable. Pancreas: Unremarkable Spleen: Unremarkable Adrenals/Urinary Tract: The adrenal glands are unremarkable. The kidneys are normal in size and position. Simple cortical cysts are seen within the kidneys bilaterally. No follow-up imaging is recommended for these lesions. The kidneys are otherwise unremarkable. Bladder is decompressed and is unremarkable. Stomach/Bowel: Interval appendectomy. There is interstitial and extraluminal gas seen surrounding the surgical  staples at the resection margin in keeping with breakdown of the resection margin and bowel perforation in this location. There is extensive cecal wall thickening in this region as well as circumferential bowel wall thickening involving the terminal ileum in this region, possibly reactive in nature. There is associated pathologic adenopathy involving the ileocolic lymph nodes which measure up to 15 mm in short axis diameter at axial image # 58/2. Together, this raises the question of malignant infiltration in the setting of an underlying colonic or appendiceal malignancy and associated wound breakdown at the surgical margin. No loculated intra-abdominal fluid collection is identified. Extensive infiltrative changes seen within the pericecal soft tissues. No free intraperitoneal fluid or gross free intraperitoneal gas identified. The stomach, small bowel, and large bowel are otherwise unremarkable. No evidence of obstruction. Vascular/Lymphatic: No significant vascular findings are present. No enlarged abdominal or pelvic lymph nodes. Reproductive: Status post hysterectomy. No adnexal masses. Other: Tiny fat containing umbilical hernia. No associated fluid collection. Musculoskeletal: Degenerative changes are seen within the lumbar spine. No acute bone abnormality. IMPRESSION: 1. Interval appendectomy. Extensive interstitial and extraluminal gas seen surrounding the surgical staples at the resection margin in keeping with breakdown of the resection margin and bowel perforation in this location. Extensive cecal wall thickening in this region as well as circumferential bowel wall thickening involving the terminal ileum in this region, possibly reactive in nature. Associated pathologic adenopathy involving the ileocolic lymph nodes. Together, this raises the question of malignant infiltration in the setting of an underlying colonic or appendiceal malignancy and associated wound breakdown at the surgical margin. No  loculated intra-abdominal fluid collection identified. 2. Interval development of multiple bilobar hypoenhancing masses in keeping with multiple hepatic metastases. Electronically Signed   By: AFidela SalisburyM.D.   On: 06/22/2022 22:09    Labs:  CBC: Recent Labs    06/24/22 0802 06/25/22 0505 06/26/22 0532 06/27/22 0419  WBC 14.8* 9.4 8.3 9.3  HGB 8.7* 8.3* 8.5* 8.8*  HCT 28.2* 26.3* 26.6* 27.7*  PLT 223 203 255 282    COAGS: Recent Labs    03/02/22 2119  INR 1.1  APTT 29    BMP: Recent Labs    06/24/22 0802 06/25/22 0649 06/26/22 0532 06/27/22 0419  NA 139 136 135 135  K 4.2 3.7 3.4* 3.7  CL 109 107 105 105  CO2 21* 21* 22 23  GLUCOSE 94 97 108* 101*  BUN '18 12 9 8  '$ CALCIUM 8.6* 8.0* 8.2* 8.5*  CREATININE 0.89 0.72 0.73 0.66  GFRNONAA >60 >60 >60 >60    LIVER FUNCTION TESTS: Recent Labs    03/02/22 2119 03/06/22 0541 06/22/22 1742  BILITOT 1.4* 0.8 1.1  AST '27 23 26  '$ ALT '19 20 16  '$ ALKPHOS 120 116 154*  PROT 8.1 5.7* 8.2*  ALBUMIN 4.1 2.7* 4.1    TUMOR MARKERS: No results for input(s): "AFPTM", "CEA", "CA199", "CHROMGRNA" in the last 8760 hours.  Assessment and Plan: Liver Lesions Patient with known appendiceal mucinous neoplasm for which she was reportedly followed at [facility] returns with complaint of nausea, vomiting, abdominal pain.   Concern for infection secondary to stump breakdown vs. Metastatic disease.  IR consulted for liver lesion biopsy.  Patient agreeable to proceed.  INR ordered.  Lovenox held  NPO p MN.    Risks and benefits  was discussed with the patient and/or patient's family including, but not limited to bleeding, infection, damage to adjacent structures or low yield requiring additional tests.  All of the questions were answered and there is agreement to proceed.  Consent signed and in chart.  Thank you for this interesting consult.  I greatly enjoyed meeting Christina [name] and look forward to participating in their  care.  A copy of this report was sent to the requesting provider on this date.  Electronically Signed: Docia Barrier, PA 06/27/2022, 3:32 PM   I spent a total of 20 Minutes    in face to face in clinical consultation, greater than 50% of which was counseling/coordinating care for liver lesions.

## 2022-06-27 NOTE — Progress Notes (Signed)
Pt has been told to have nothing to eat or drink after midnight. She stated understanding and that she is due to have a procedure tomorrow morning.

## 2022-06-27 NOTE — Progress Notes (Signed)
Subjective/Chief Complaint: Having bowel function, tol some diet, feels ok   Objective: Vital signs in last 24 hours: Temp:  [98.4 F (36.9 C)-99.8 F (37.7 C)] 99.3 F (37.4 C) (11/05 0518) Pulse Rate:  [84-89] 84 (11/05 0518) Resp:  [16] 16 (11/05 0518) BP: (122-136)/(67-79) 123/67 (11/05 0518) SpO2:  [93 %-96 %] 93 % (11/05 0518) Weight:  [83.9 kg] 83.9 kg (11/05 0616) Last BM Date : 06/26/22  Intake/Output from previous day: 11/04 0701 - 11/05 0700 In: 2423.3 [P.O.:220; I.V.:1492.5; IV Piggyback:710.8] Out: -  Intake/Output this shift: No intake/output data recorded.  General nad Pulm effort normal CV regular Ab soft nontender nondistended  Lab Results:  Recent Labs    06/26/22 0532 06/27/22 0419  WBC 8.3 9.3  HGB 8.5* 8.8*  HCT 26.6* 27.7*  PLT 255 282   BMET Recent Labs    06/26/22 0532 06/27/22 0419  NA 135 135  K 3.4* 3.7  CL 105 105  CO2 22 23  GLUCOSE 108* 101*  BUN 9 8  CREATININE 0.73 0.66  CALCIUM 8.2* 8.5*   PT/INR No results for input(s): "LABPROT", "INR" in the last 72 hours. ABG No results for input(s): "PHART", "HCO3" in the last 72 hours.  Invalid input(s): "PCO2", "PO2"  Studies/Results: CT ABD PELVIS W/WO CM ONCOLOGY LIVER PROTOCOL  Result Date: 06/25/2022 CLINICAL DATA:  Appendiceal mucinous neoplasm. Recent appendectomy. Follow-up metastatic disease and postop inflammatory disease. * Tracking Code: BO * EXAM: CT ABDOMEN AND PELVIS WITHOUT AND WITH CONTRAST TECHNIQUE: Multidetector CT imaging of the abdomen and pelvis was performed following the standard protocol before and following the bolus administration of intravenous contrast. RADIATION DOSE REDUCTION: This exam was performed according to the departmental dose-optimization program which includes automated exposure control, adjustment of the mA and/or kV according to patient size and/or use of iterative reconstruction technique. CONTRAST:  166m OMNIPAQUE IOHEXOL 300  MG/ML  SOLN COMPARISON:  06/22/2022 FINDINGS: Lower Chest: No acute findings. Hepatobiliary: Multiple hypovascular metastases are again seen throughout the liver, without significant change compared to recent exam. Gallbladder is unremarkable. No evidence of biliary ductal dilatation. Pancreas:  No mass or inflammatory changes. Spleen: Within normal limits in size and appearance. Adrenals/Urinary Tract: No suspicious masses identified. No evidence of ureteral calculi or hydronephrosis. Stomach/Bowel: Diverticulosis is seen mainly involving the descending and sigmoid colon, however there is no evidence of diverticulitis. Mild diffuse wall thickening throughout the rectum and sigmoid colon is suspicious for proctocolitis. Postop changes are again seen from appendectomy. A focal 2.4 cm gas collection adjacent to surgical staples and base of cecum remains stable. No evidence of extravasation of oral contrast or free intraperitoneal air. No evidence of bowel obstruction. Nodular wall thickening is again seen involving the base of the cecum and terminal ileum, which is suspicious for tumor. Vascular/Lymphatic: Lymphadenopathy is again seen in the right abdominal mesentery, with largest lymph node measuring 2.1 cm on image 92/11, compared to 1.5 cm previously. This is highly suspicious for metastatic disease. No acute vascular findings. Reproductive: Prior hysterectomy noted. Adnexal regions are unremarkable in appearance. Other:  None. Musculoskeletal:  No suspicious bone lesions identified. IMPRESSION: Postop changes from appendectomy, with stable 2.4 cm gas collection adjacent to surgical staples and base of cecum. No evidence of contrast leak or free intraperitoneal air. Persistent nodular wall thickening involving the base of the cecum and terminal ileum, highly suspicious for tumor. Mild increase in right lower quadrant mesenteric lymphadenopathy, highly suspicious for metastatic disease. Diffuse hepatic metastatic  disease, without significant change. Mild diffuse wall thickening throughout the rectum and sigmoid colon, suspicious for proctocolitis. Colonic diverticulosis, without radiographic evidence of diverticulitis. Electronically Signed   By: Marlaine Hind M.D.   On: 06/25/2022 15:47    Anti-infectives: Anti-infectives (From admission, onward)    Start     Dose/Rate Route Frequency Ordered Stop   06/25/22 1200  ceFEPIme (MAXIPIME) 2 g in sodium chloride 0.9 % 100 mL IVPB        2 g 200 mL/hr over 30 Minutes Intravenous Every 8 hours 06/25/22 1104     06/23/22 1200  ceFEPIme (MAXIPIME) 2 g in sodium chloride 0.9 % 100 mL IVPB  Status:  Discontinued        2 g 200 mL/hr over 30 Minutes Intravenous Every 12 hours 06/22/22 2355 06/25/22 1104   06/23/22 1000  metroNIDAZOLE (FLAGYL) IVPB 500 mg        500 mg 100 mL/hr over 60 Minutes Intravenous Every 12 hours 06/23/22 0957     06/22/22 2230  ceFEPIme (MAXIPIME) 2 g in sodium chloride 0.9 % 100 mL IVPB        2 g 200 mL/hr over 30 Minutes Intravenous  Once 06/22/22 2225 06/22/22 2347   06/22/22 2230  metroNIDAZOLE (FLAGYL) IVPB 500 mg        500 mg 100 mL/hr over 60 Minutes Intravenous  Once 06/22/22 2225 06/23/22 0019       Assessment/Plan: H/O low grade appendiceal mucinous neoplasm with possible mets vs infection secondary to breakdown of resection margin at appendiceal stump -I wrote for IR eval for liver biopsy  -cont abx therapy -WBC normal -regular diet   FEN - regular diet VTE - lovenox ID - Maxipime/Flagyl   Rolm Bookbinder 06/27/2022

## 2022-06-28 ENCOUNTER — Inpatient Hospital Stay (HOSPITAL_COMMUNITY): Payer: Medicare Other

## 2022-06-28 ENCOUNTER — Other Ambulatory Visit: Payer: Self-pay

## 2022-06-28 HISTORY — PX: IR US GUIDE BX ASP/DRAIN: IMG2392

## 2022-06-28 LAB — CBC
HCT: 25.9 % — ABNORMAL LOW (ref 36.0–46.0)
Hemoglobin: 8.3 g/dL — ABNORMAL LOW (ref 12.0–15.0)
MCH: 26.1 pg (ref 26.0–34.0)
MCHC: 32 g/dL (ref 30.0–36.0)
MCV: 81.4 fL (ref 80.0–100.0)
Platelets: 275 10*3/uL (ref 150–400)
RBC: 3.18 MIL/uL — ABNORMAL LOW (ref 3.87–5.11)
RDW: 13.5 % (ref 11.5–15.5)
WBC: 10 10*3/uL (ref 4.0–10.5)
nRBC: 0 % (ref 0.0–0.2)

## 2022-06-28 LAB — PROTIME-INR
INR: 1.1 (ref 0.8–1.2)
Prothrombin Time: 14.3 seconds (ref 11.4–15.2)

## 2022-06-28 LAB — BASIC METABOLIC PANEL
Anion gap: 7 (ref 5–15)
BUN: 8 mg/dL (ref 8–23)
CO2: 22 mmol/L (ref 22–32)
Calcium: 8.4 mg/dL — ABNORMAL LOW (ref 8.9–10.3)
Chloride: 105 mmol/L (ref 98–111)
Creatinine, Ser: 0.7 mg/dL (ref 0.44–1.00)
GFR, Estimated: >60 mL/min (ref >60)
Glucose, Bld: 87 mg/dL (ref 70–99)
Potassium: 3.5 mmol/L (ref 3.5–5.1)
Sodium: 134 mmol/L — ABNORMAL LOW (ref 135–145)

## 2022-06-28 LAB — CULTURE, BLOOD (SINGLE)
Culture: NO GROWTH
Special Requests: ADEQUATE

## 2022-06-28 MED ORDER — ENOXAPARIN SODIUM 40 MG/0.4ML IJ SOSY
40.0000 mg | PREFILLED_SYRINGE | INTRAMUSCULAR | Status: DC
  Administered 2022-06-29 – 2022-06-30 (×2): 40 mg via SUBCUTANEOUS
  Filled 2022-06-28 (×2): qty 0.4

## 2022-06-28 MED ORDER — SODIUM CHLORIDE 0.9 % IV SOLN: INTRAVENOUS | Status: DC

## 2022-06-28 MED ORDER — MIDAZOLAM HCL 2 MG/2ML IJ SOLN
INTRAMUSCULAR | Status: AC
  Filled 2022-06-28: qty 4

## 2022-06-28 MED ORDER — FENTANYL CITRATE (PF) 100 MCG/2ML IJ SOLN
INTRAMUSCULAR | Status: AC | PRN
  Administered 2022-06-28: 50 ug via INTRAVENOUS
  Administered 2022-06-28: 25 ug via INTRAVENOUS

## 2022-06-28 MED ORDER — LIDOCAINE HCL 1 % IJ SOLN
INTRAMUSCULAR | Status: AC
  Administered 2022-06-28: 5 mL via SUBCUTANEOUS
  Filled 2022-06-28: qty 20

## 2022-06-28 MED ORDER — MIDAZOLAM HCL 2 MG/2ML IJ SOLN
INTRAMUSCULAR | Status: AC | PRN
  Administered 2022-06-28: .5 mg via INTRAVENOUS
  Administered 2022-06-28: 1 mg via INTRAVENOUS

## 2022-06-28 MED ORDER — FENTANYL CITRATE (PF) 100 MCG/2ML IJ SOLN
INTRAMUSCULAR | Status: AC
  Filled 2022-06-28: qty 4

## 2022-06-28 MED ORDER — GELATIN ABSORBABLE 12-7 MM EX MISC
CUTANEOUS | Status: AC
  Administered 2022-06-28: 1 via BODY_CAVITY
  Filled 2022-06-28: qty 1

## 2022-06-28 NOTE — Care Management Important Message (Signed)
Important Message  Patient Details IM Letter given Name: Christina [name] MRN: [id number] Date of Birth: 12-Feb-1946   Medicare Important Message Given:  Yes     [name] 06/28/2022, 3:16 PM

## 2022-06-28 NOTE — Congregational Nurse Program (Signed)
MEDICATION-RELATED CONSULT NOTE   IR Procedure Consult - Anticoagulant/Antiplatelet PTA/Inpatient Med List Review by Pharmacist    Procedure: US guided liver mass biopsy    Completed: 06/28/22 ~1200  Post-Procedural bleeding risk per IR MD assessment:    Antithrombotic medications on inpatient or PTA profile prior to procedure:   Lovenox '40mg'$  q24    Recommended restart time per IR Post-Procedure Guidelines:  Day + 1 (Next AM)   Other considerations:      Plan:    Restart Lovenox '40mg'$  SQ q24 on 11/7 at [place], PharmD, BCPS Secure Chat if ?s 06/28/2022 12:45 PM

## 2022-06-28 NOTE — Progress Notes (Addendum)
   Subjective/Chief Complaint: No new complaints. NPO for IR procedure but had been tolerating diet. No abdominal pain.  Objective: Vital signs in last 24 hours: Temp:  [98.5 F (36.9 C)-99.9 F (37.7 C)] 99.9 F (37.7 C) (11/06 0416) Pulse Rate:  [83-89] 84 (11/06 0416) Resp:  [16-18] 16 (11/06 0416) BP: (113-126)/(68-73) 126/72 (11/06 0416) SpO2:  [95 %-98 %] 95 % (11/06 0416) Last BM Date : 06/26/22  Intake/Output from previous day: 11/05 0701 - 11/06 0700 In: 264.4 [P.O.:60; IV Piggyback:204.4] Out: 2400 [Urine:2400] Intake/Output this shift: No intake/output data recorded.  General nad Pulm effort normal CV regular Ab soft nontender nondistended. Nodular umbilical incision with beefy red lesion - no fluctuance, discharge, erythema  Lab Results:  Recent Labs    06/27/22 0419 06/28/22 0440  WBC 9.3 10.0  HGB 8.8* 8.3*  HCT 27.7* 25.9*  PLT 282 275    BMET Recent Labs    06/27/22 0419 06/28/22 0440  NA 135 134*  K 3.7 3.5  CL 105 105  CO2 23 22  GLUCOSE 101* 87  BUN 8 8  CREATININE 0.66 0.70  CALCIUM 8.5* 8.4*    PT/INR Recent Labs    06/28/22 0440  LABPROT 14.3  INR 1.1   ABG No results for input(s): "PHART", "HCO3" in the last 72 hours.  Invalid input(s): "PCO2", "PO2"  Studies/Results: No results found.  Anti-infectives: Anti-infectives (From admission, onward)    Start     Dose/Rate Route Frequency Ordered Stop   06/25/22 1200  ceFEPIme (MAXIPIME) 2 g in sodium chloride 0.9 % 100 mL IVPB        2 g 200 mL/hr over 30 Minutes Intravenous Every 8 hours 06/25/22 1104     06/23/22 1200  ceFEPIme (MAXIPIME) 2 g in sodium chloride 0.9 % 100 mL IVPB  Status:  Discontinued        2 g 200 mL/hr over 30 Minutes Intravenous Every 12 hours 06/22/22 2355 06/25/22 1104   06/23/22 1000  metroNIDAZOLE (FLAGYL) IVPB 500 mg        500 mg 100 mL/hr over 60 Minutes Intravenous Every 12 hours 06/23/22 0957     06/22/22 2230  ceFEPIme (MAXIPIME) 2 g  in sodium chloride 0.9 % 100 mL IVPB        2 g 200 mL/hr over 30 Minutes Intravenous  Once 06/22/22 2225 06/22/22 2347   06/22/22 2230  metroNIDAZOLE (FLAGYL) IVPB 500 mg        500 mg 100 mL/hr over 60 Minutes Intravenous  Once 06/22/22 2225 06/23/22 0019       Assessment/Plan: 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 now resolved with Cr 0.70   Winferd Humphrey, [facility] Surgery 06/28/2022, 8:31 AM Please see Amion for pager number during day hours 7:00am-4:30pm

## 2022-06-28 NOTE — Procedures (Signed)
Interventional Radiology Procedure Note  Procedure: US guided liver mass biopsy  Indication: Multiple liver lesions  Findings: Please refer to procedural dictation for full description.  Complications: None  EBL: < 10 mL  Miachel Roux, MD [phone]

## 2022-06-29 LAB — CBC
HCT: 26 % — ABNORMAL LOW (ref 36.0–46.0)
Hemoglobin: 8.3 g/dL — ABNORMAL LOW (ref 12.0–15.0)
MCH: 26 pg (ref 26.0–34.0)
MCHC: 31.9 g/dL (ref 30.0–36.0)
MCV: 81.5 fL (ref 80.0–100.0)
Platelets: 309 10*3/uL (ref 150–400)
RBC: 3.19 MIL/uL — ABNORMAL LOW (ref 3.87–5.11)
RDW: 13.4 % (ref 11.5–15.5)
WBC: 10.3 10*3/uL (ref 4.0–10.5)
nRBC: 0 % (ref 0.0–0.2)

## 2022-06-29 LAB — BASIC METABOLIC PANEL
Anion gap: 5 (ref 5–15)
BUN: 10 mg/dL (ref 8–23)
CO2: 23 mmol/L (ref 22–32)
Calcium: 8.2 mg/dL — ABNORMAL LOW (ref 8.9–10.3)
Chloride: 106 mmol/L (ref 98–111)
Creatinine, Ser: 0.69 mg/dL (ref 0.44–1.00)
GFR, Estimated: >60 mL/min (ref >60)
Glucose, Bld: 108 mg/dL — ABNORMAL HIGH (ref 70–99)
Potassium: 3.3 mmol/L — ABNORMAL LOW (ref 3.5–5.1)
Sodium: 134 mmol/L — ABNORMAL LOW (ref 135–145)

## 2022-06-29 LAB — MAGNESIUM: Magnesium: 1.8 mg/dL (ref 1.7–2.4)

## 2022-06-29 MED ORDER — MORPHINE SULFATE (PF) 2 MG/ML IV SOLN: 1.0000 mg | INTRAVENOUS | Status: DC | PRN

## 2022-06-29 MED ORDER — MAGNESIUM OXIDE -MG SUPPLEMENT 400 (240 MG) MG PO TABS
200.0000 mg | ORAL_TABLET | Freq: Two times a day (BID) | ORAL | Status: AC
  Administered 2022-06-29 (×2): 200 mg via ORAL
  Filled 2022-06-29 (×2): qty 1

## 2022-06-29 MED ORDER — POTASSIUM CHLORIDE CRYS ER 20 MEQ PO TBCR
30.0000 meq | EXTENDED_RELEASE_TABLET | Freq: Two times a day (BID) | ORAL | Status: AC
  Administered 2022-06-29 – 2022-06-30 (×2): 30 meq via ORAL
  Filled 2022-06-29 (×2): qty 1

## 2022-06-29 MED ORDER — BOOST / RESOURCE BREEZE PO LIQD CUSTOM
1.0000 | Freq: Three times a day (TID) | ORAL | Status: DC
  Administered 2022-06-29: 1 via ORAL

## 2022-06-29 MED ORDER — OXYCODONE HCL 5 MG PO TABS: 5.0000 mg | ORAL_TABLET | ORAL | Status: DC | PRN

## 2022-06-29 MED ORDER — POTASSIUM CHLORIDE CRYS ER 20 MEQ PO TBCR
30.0000 meq | EXTENDED_RELEASE_TABLET | Freq: Two times a day (BID) | ORAL | Status: DC
  Administered 2022-06-29: 30 meq via ORAL
  Filled 2022-06-29: qty 1

## 2022-06-29 NOTE — Progress Notes (Signed)
Subjective/Chief Complaint: Some abdominal pain at bx site upper abdomen but improved with pain medications. No nausea. Tolerating diet but low appetite - has not had breakfast and states she has been eating only one meal per day. Has not been drinking ensure due to lactose intolerance. Has had a BM this admission. Ambulating some  Objective: Vital signs in last 24 hours: Temp:  [97.5 F (36.4 C)-99.2 F (37.3 C)] 99.1 F (37.3 C) (11/07 0545) Pulse Rate:  [74-99] 89 (11/07 0545) Resp:  [11-18] 18 (11/07 0545) BP: (106-135)/(58-82) 118/63 (11/07 0545) SpO2:  [95 %-100 %] 95 % (11/07 0545) Last BM Date : 06/27/22  Intake/Output from previous day: 11/06 0701 - 11/07 0700 In: 540 [P.O.:240; IV Piggyback:300] Out: 1820 [Urine:1820] Intake/Output this shift: No intake/output data recorded.  General nad Pulm effort normal CV regular Ab soft nontender nondistended. Nodular umbilical incision with beefy red lesion - no fluctuance, discharge, erythema. Bandage from bx c/d/i  Lab Results:  Recent Labs    06/28/22 0440 06/29/22 0521  WBC 10.0 10.3  HGB 8.3* 8.3*  HCT 25.9* 26.0*  PLT 275 309    BMET Recent Labs    06/28/22 0440 06/29/22 0521  NA 134* 134*  K 3.5 3.3*  CL 105 106  CO2 22 23  GLUCOSE 87 108*  BUN 8 10  CREATININE 0.70 0.69  CALCIUM 8.4* 8.2*    PT/INR Recent Labs    06/28/22 0440  LABPROT 14.3  INR 1.1    ABG No results for input(s): "PHART", "HCO3" in the last 72 hours.  Invalid input(s): "PCO2", "PO2"  Studies/Results: IR US Guide Bx Asp/Drain  Result Date: 06/28/2022 INDICATION: 76 year old woman with history of appendiceal mucinous neoplasm presents to IR for biopsy of multifocal liver lesions. EXAM: Ultrasound-guided biopsy of left liver mass MEDICATIONS: None. ANESTHESIA/SEDATION: Moderate (conscious) sedation was employed during this procedure. A total of Versed 1.5 mg and Fentanyl 75 mcg was administered intravenously by the  radiology nurse. Total intra-service moderate Sedation Time: 15 minutes. The patient's level of consciousness and vital signs were monitored continuously by radiology nursing throughout the procedure under my direct supervision. COMPLICATIONS: None immediate. PROCEDURE: Informed written consent was obtained from the patient after a thorough discussion of the procedural risks, benefits and alternatives. All questions were addressed. Maximal Sterile Barrier Technique was utilized including caps, mask, sterile gowns, sterile gloves, sterile drape, hand hygiene and skin antiseptic. A timeout was performed prior to the initiation of the procedure. Patient position supine on the ultrasound table. Epigastric skin prepped and draped in usual sterile fashion. Following local lidocaine administration, 17 gauge introducer needle was advanced into the left hepatic lobe mass, and three 18 gauge cores were obtained utilizing continuous ultrasound guidance. Gelfoam slurry was administered through the introducer needle at the biopsy site. Samples were sent to pathology in formalin. Needle removed and hemostasis achieved with 5 minutes of manual compression. Post procedure ultrasound images showed no evidence of significant hemorrhage. IMPRESSION: Ultrasound-guided biopsy of left liver mass. Electronically Signed   By: Miachel Roux M.D.   On: 06/28/2022 12:17    Anti-infectives: Anti-infectives (From admission, onward)    Start     Dose/Rate Route Frequency Ordered Stop   06/25/22 1200  ceFEPIme (MAXIPIME) 2 g in sodium chloride 0.9 % 100 mL IVPB        2 g 200 mL/hr over 30 Minutes Intravenous Every 8 hours 06/25/22 1104 06/29/22 2359   06/23/22 1200  ceFEPIme (MAXIPIME) 2 g in  sodium chloride 0.9 % 100 mL IVPB  Status:  Discontinued        2 g 200 mL/hr over 30 Minutes Intravenous Every 12 hours 06/22/22 2355 06/25/22 1104   06/23/22 1000  metroNIDAZOLE (FLAGYL) IVPB 500 mg        500 mg 100 mL/hr over 60 Minutes  Intravenous Every 12 hours 06/23/22 0957 06/29/22 2359   06/22/22 2230  ceFEPIme (MAXIPIME) 2 g in sodium chloride 0.9 % 100 mL IVPB        2 g 200 mL/hr over 30 Minutes Intravenous  Once 06/22/22 2225 06/22/22 2347   06/22/22 2230  metroNIDAZOLE (FLAGYL) IVPB 500 mg        500 mg 100 mL/hr over 60 Minutes Intravenous  Once 06/22/22 2225 06/23/22 0019       Assessment/Plan: 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 Has seen oncology at [facility] and may need sooner follow up pending results - will complete 7 days abx therapy today -WBC normal - tolerating regular diet but low appetite. Add boost (lactose intolerant) - added oral pain meds this am - hypokalemia - replete PO.   AKI on admission - resolved. SLIV   FEN - regular. Boost/breeze. SLIV VTE - lovenox ID - Maxipime/Flagyl stop today  Dispo: plan for dc tomorrow - PT/OT today to eval for dc needs in setting of deconditioning  Winferd Humphrey, [facility] Surgery 06/29/2022, 8:17 AM Please see Amion for pager number during day hours 7:00am-4:30pm

## 2022-06-29 NOTE — Progress Notes (Signed)
Mobility Specialist - Progress Note   06/29/22 1206  Mobility  Activity Ambulated with assistance in hallway  Level of Assistance Standby assist, set-up cues, supervision of patient - no hands on  Assistive Device Front wheel walker  Distance Ambulated (ft) 500 ft  Activity Response Tolerated well  Mobility Referral Yes  $Mobility charge 1 Mobility   Pt received in recliner and agreeable to mobility. No complaints during mobility. Pt did express her worry on how she was going to be able to walk up her steps once she goes home due to weakness. Pt to recliner after session with all needs met & food tray.  [facility]

## 2022-06-29 NOTE — Progress Notes (Signed)
Mobility Specialist - Progress Note   06/29/22 1526  Mobility  Activity Ambulated with assistance in hallway  Level of Assistance Independent after set-up  Distance Ambulated (ft) 500 ft  Activity Response Tolerated well  Mobility Referral Yes  $Mobility charge 1 Mobility   Pt received in recliner and agreeable to mobility. No complaints during mobility. Pt IV was leaking & nurse notified. Pt to recliner after session with all needs met.     [facility]

## 2022-06-30 ENCOUNTER — Other Ambulatory Visit: Payer: Self-pay | Admitting: *Deleted

## 2022-06-30 ENCOUNTER — Encounter: Payer: Self-pay | Admitting: *Deleted

## 2022-06-30 DIAGNOSIS — C801 Malignant (primary) neoplasm, unspecified: Secondary | ICD-10-CM | POA: Diagnosis present

## 2022-06-30 DIAGNOSIS — C799 Secondary malignant neoplasm of unspecified site: Secondary | ICD-10-CM | POA: Diagnosis present

## 2022-06-30 DIAGNOSIS — Z170 Estrogen receptor positive status [ER+]: Secondary | ICD-10-CM

## 2022-06-30 LAB — BASIC METABOLIC PANEL
Anion gap: 8 (ref 5–15)
BUN: 11 mg/dL (ref 8–23)
CO2: 24 mmol/L (ref 22–32)
Calcium: 8.9 mg/dL (ref 8.9–10.3)
Chloride: 106 mmol/L (ref 98–111)
Creatinine, Ser: 0.68 mg/dL (ref 0.44–1.00)
GFR, Estimated: >60 mL/min (ref >60)
Glucose, Bld: 103 mg/dL — ABNORMAL HIGH (ref 70–99)
Potassium: 4.1 mmol/L (ref 3.5–5.1)
Sodium: 138 mmol/L (ref 135–145)

## 2022-06-30 LAB — MAGNESIUM: Magnesium: 2 mg/dL (ref 1.7–2.4)

## 2022-06-30 NOTE — Progress Notes (Signed)
PATIENT NAVIGATOR PROGRESS NOTE  Name: Christina [name] Date: 06/30/2022 MRN: [id number]  DOB: [birth year]   Reason for visit:  New Patient Appt  Comments:  Called and spoke with daughter regarding New Patient appt with Dr Benay Spice for November 17,2023 at 1:40 pm Discussed what to expect at appt and provided contact information.     Time spent counseling/coordinating care: 30-45 minutes

## 2022-06-30 NOTE — Evaluation (Signed)
Occupational Therapy Evaluation Patient Details Name: Christina [name] MRN: [id number] DOB: Feb 24, 1946 Today's Date: 06/30/2022   History of Present Illness Christina [name] is a 76 yo female presented with intermittent abdominal pain and drainage from umbilical incision with history of lap appy for a perforated appendicitis by Dr. Brantley Stage on 03/03/22; pt s/p US guided liver mass biopsy 06/28/22. PMH: breast cancer, HTN   Clinical Impression   Christina [name] is a 76 year old woman who presents at her baseline. She is able to perform bed transfers, is independent with ADLs and is able to ambulate with walker in hall and traverse stairs with assistance. From an OT standpoint she has no needs.      Recommendations for follow up therapy are one component of a multi-disciplinary discharge planning process, led by the attending physician.  Recommendations may be updated based on patient status, additional functional criteria and insurance authorization.   Follow Up Recommendations  No OT follow up    Assistance Recommended at Discharge None  Patient can return home with the following Help with stairs or ramp for entrance    Functional Status Assessment  Patient has not had a recent decline in their functional status  Equipment Recommendations  None recommended by OT    Recommendations for Other Services       Precautions / Restrictions Precautions Precautions: Fall Restrictions Weight Bearing Restrictions: No      Mobility Bed Mobility Overal bed mobility: Modified Independent                  Transfers Overall transfer level: Needs assistance Equipment used: Rolling walker (2 wheels) Transfers: Sit to/from Stand Sit to Stand: Supervision           General transfer comment: verbal cues for hand placement to power up to standing, no physical assist      Balance Overall balance assessment: Mild deficits observed, not formally tested                                          ADL either performed or assessed with clinical judgement   ADL Overall ADL's : At baseline                                             Vision   Additional Comments: Vision is functional per reports. Strabismus L > R     Perception     Praxis      Pertinent Vitals/Pain Pain Assessment Pain Assessment: No/denies pain     Hand Dominance Right   Extremity/Trunk Assessment Upper Extremity Assessment Upper Extremity Assessment: RUE deficits/detail;LUE deficits/detail RUE Deficits / Details: WFL ROM, 3+/5 shoulder strength, 4-/5 elbow strength, 5/5 wrist, 4/5 grip RUE Sensation: WNL RUE Coordination: WNL LUE Deficits / Details: WFL ROM, 3+/5 shoulder strength, 4-/5 elbow strength, 5/5 wrist, 4/5 grip LUE Sensation: WNL LUE Coordination: WNL   Lower Extremity Assessment Lower Extremity Assessment: Defer to PT evaluation   Cervical / Trunk Assessment Cervical / Trunk Assessment: Normal   Communication Communication Communication: No difficulties   Cognition Arousal/Alertness: Awake/alert Behavior During Therapy: WFL for tasks assessed/performed Overall Cognitive Status: Within Functional Limits for tasks assessed  General Comments: Pleasant and agreeable.     General Comments       Exercises     Shoulder Instructions      Home Living Family/patient expects to be discharged to:: Private residence Living Arrangements: Children   Type of Home: Apartment Home Access: Stairs to enter Technical brewer of Steps: 14 Entrance Stairs-Rails: Right       Bathroom Shower/Tub: Teacher, early years/pre: Standard     Home Equipment: Public relations account executive (2 wheels)   Additional Comments: Daughter works from home      Prior Functioning/Environment Prior Level of Function : Independent/Modified Independent             Mobility Comments: uses walker  when she feels light headed ADLs Comments: independent with ADLs        OT Problem List: Decreased strength      OT Treatment/Interventions:      OT Goals(Current goals can be found in the care plan section) Acute Rehab OT Goals OT Goal Formulation: All assessment and education complete, DC therapy  OT Frequency:      Co-evaluation              AM-PAC OT "6 Clicks" Daily Activity     Outcome Measure Help from another person eating meals?: None Help from another person taking care of personal grooming?: None Help from another person toileting, which includes using toliet, bedpan, or urinal?: None Help from another person bathing (including washing, rinsing, drying)?: None Help from another person to put on and taking off regular upper body clothing?: None Help from another person to put on and taking off regular lower body clothing?: None 6 Click Score: 24   End of Session Equipment Utilized During Treatment: Rolling walker (2 wheels) Nurse Communication: Mobility status  Activity Tolerance: Patient tolerated treatment well Patient left: in chair;with call bell/phone within reach  OT Visit Diagnosis: Muscle weakness (generalized) (M62.81)                Time: 4854-6270 OT Time Calculation (min): 21 min Charges:  OT General Charges $OT Visit: 1 Visit OT Evaluation $OT Eval Low Complexity: 1 Low  Gustavo Lah, OTR/L [place]  Office [phone]   Lenward Chancellor 06/30/2022, 12:35 PM

## 2022-06-30 NOTE — Discharge Planning (Signed)
Oncology Discharge Planning Note  [facility] Address: [address] [place], [place], [place] [address] Hours of Operation:  Nena Polio, Monday - Friday  Clinic Contact Information:  [phone]  Oncology Care Team: Medical Oncologist:  Benay Spice  Patient Details: Name:  Christina [name], Christina [name] MRN:   [id number] DOB:   December 09, 1945 Reason for Current Admission: Postoperative leak  Discharge Planning Narrative: Notification of admission received by inpatient team for [facility].  Discharge follow-up appointments for oncology are current and available on the AVS and MyChart.   Upon discharge from the hospital, hematology/oncology's post discharge plan of care for the outpatient setting is:  July 09, 2022 at 1:40 pm [facility] [place],  [id number]  [place] will be called within two business days after discharge to review hematology/oncology's plan of care for full understanding.    Outpatient Oncology Specific Care Only: Oncology appointment transportation needs addressed?:  yes Oncology medication management for symptom management addressed?:  not applicable Chemo Alert Card reviewed?:  not applicable Immunotherapy Alert Card reviewed?:  not applicable

## 2022-06-30 NOTE — Discharge Summary (Addendum)
[facility] Discharge Summary   Patient ID: Christina [name] MRN: [id number] DOB/AGE: [birth year] 74 y.o.  Admit date: 06/22/2022 Discharge date: 06/30/2022  Admitting Diagnosis: Bowel perforation ([place]) [K63.1] Postoperative leak [T81.89XA] Malignant neoplasm metastatic to other site [facility]) [C79.89]   Discharge Diagnosis Bowel perforation ([place]) [K63.1] Postoperative leak [T81.89XA] Malignant neoplasm metastatic to other site [facility]) [C79.89] Hypokalemia - resolved  Consultants Interventional radiology  Imaging: No results found.  Procedures Dr. Dwaine Gale (06/28/22) - Ultrasound-guided biopsy of left liver mass.   Hospital Course:  76 year old female who presented to [facility] ED with intermittent abdominal pain and drainage from umbilical incision with history of lap appy for a perforated appendicitis by Dr. Brantley Stage on 03/03/22.  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  She was referred to [facility] and saw Dr. Clovis Riley on 04/02/22 with recommendation for endoscopic evaluation which has not been completed.    Initial workup in the ED showed concern for: - breakdown of appendiceal resection margin   - extensive cecal wall thickening and circumferential bowel wall thickening up to TI with likely pathologic adenopathy and new development of bilobar masses in the liver c/w hepatic metastases .    Patient was admitted for further evaluation and management. Findings were discussed with Dr. Clovis Riley at [facility] who thought these findings may all be secondary to infection and not mets. Patient was placed on IV antibiotics and WBC improved. She completed a 7 day course during admission. Repeat CT scan 11/3 did not show contrast leak from site of resection. Her diet was advance to regular food which she tolerated well though she admitted to low appetite. IR was consulted for liver biopsy of suspicious liver mass which resulted with adenocarcinoma concerning for primary  colon cancer. I called and discussed these findings with [facility] hematology/oncology who will arrange follow up. On date of discharge, the patient was voiding well, tolerating diet, ambulating well, pain well controlled, vital signs stable, and felt stable for discharge home.  Patient will follow up in our office in 3 weeks and knows to call with questions or concerns.  She will follow up with hematology oncology. PT recommended HHPT and this was arranged prior to discharge.  Physical Exam: General:  Alert, NAD, pleasant, comfortable Abd:  Soft, ND, NT, umbilical incision with beefy red lesion - no fluctuance, discharge, erythema. Bandage from bx c/d/i   I or a member of my team have reviewed this patient in the Controlled Substance Database.   Allergies as of 06/30/2022       Reactions   Onion Nausea And Vomiting   White onion   Simvastatin Other (See Comments)   myalgia   Aspirin Nausea Only, Nausea And Vomiting   Hydrocodone Rash   With itching        Medication List     STOP taking these medications    oxyCODONE 5 MG immediate release tablet Commonly known as: Oxy IR/ROXICODONE       TAKE these medications    acetaminophen 500 MG tablet Commonly known as: TYLENOL Take 500 mg by mouth every 6 (six) hours as needed for fever.   amLODipine 10 MG tablet Commonly known as: NORVASC Take 1 tablet (10 mg total) by mouth daily.   amLODipine 10 MG tablet Commonly known as: NORVASC Take 1 tablet (10 mg total) by mouth daily. - Dose change.   fexofenadine 60 MG tablet Commonly known as: ALLEGRA Take 1 tablet by mouth 2 times daily.   olmesartan  20 MG tablet Commonly known as: BENICAR Take 1 tablet (20 mg total) by mouth daily. What changed: when to take this   olopatadine 0.1 % ophthalmic solution Commonly known as: [place] 1 drop into both eyes 2 times daily.          Follow-up Information     Maczis, Carlena Hurl, [place]. Go on 06/30/2022.   Specialty:  General Surgery Why: follow up on 07/22/22 at 8:30 am. Please arrive 30 minutes early to complete check in, and bring photo ID and insurance card. Contact information: [place] [address] [phone]         Ladell Pier, MD. Go on 07/09/2022.   Specialty: Oncology Why: appointment on 07/09/2022 1:40 PM. Eartha Inch 30 minutes early to complete check in, and bring photo ID and insurance card. Contact information: [place] [address] [phone]                 Signed: Caroll Rancher [facility] 06/30/2022, 12:26 PM Please see Amion for pager number during day hours 7:00am-4:30pm

## 2022-06-30 NOTE — Evaluation (Addendum)
Physical Therapy Evaluation Patient Details Name: Christina [name] MRN: [id number] DOB: 1946-03-31 Today's Date: 06/30/2022  History of Present Illness  Giavanni Zeitlin is a 76 yo female presented with intermittent abdominal pain and drainage from umbilical incision with history of lap appy for a perforated appendicitis by Dr. Brantley Stage on 03/03/22; pt s/p US guided liver mass biopsy 06/28/22. PMH: breast cancer, HTN  Clinical Impression  Pt admitted with above diagnosis. Pt from home with daughter, lives in 2nd floor apartment, ind and using RW when feeling lightheaded. Pt ambulates well in hallway, cues for RW management but pt prefers her way. Pt ascends/descends stairs with R handrail to mimic home setup, needing supv to ascend. Pt appears fearful of heights or possibly having difficulty seeing step transition and requesting therapist standing closer with stair descent, min guard for safety. Recommending HHPT, pt weaker than normal and requesting to use RW when normally only needing it PRN. Pt currently with functional limitations due to the deficits listed below (see PT Problem List). Pt will benefit from skilled PT to increase their independence and safety with mobility to allow discharge to the venue listed below.          Recommendations for follow up therapy are one component of a multi-disciplinary discharge planning process, led by the attending physician.  Recommendations may be updated based on patient status, additional functional criteria and insurance authorization.  Follow Up Recommendations Home health PT      Assistance Recommended at Discharge Intermittent Supervision/Assistance  Patient can return home with the following  Assistance with cooking/housework;Assist for transportation;Help with stairs or ramp for entrance    Equipment Recommendations None recommended by PT  Recommendations for Other Services       Functional Status Assessment Patient has had a recent decline in  their functional status and demonstrates the ability to make significant improvements in function in a reasonable and predictable amount of time.     Precautions / Restrictions Precautions Precautions: Fall Restrictions Weight Bearing Restrictions: No      Mobility  Bed Mobility Overal bed mobility: Modified Independent  General bed mobility comments: increased time and effort to come to EOB, no assist and no use of bedrail    Transfers Overall transfer level: Needs assistance Equipment used: Rolling walker (2 wheels) Transfers: Sit to/from Stand Sit to Stand: Supervision  General transfer comment: verbal cues for hand placement to power up to standing, no physical assist    Ambulation/Gait Ambulation/Gait assistance: Supervision Gait Distance (Feet): 360 Feet Assistive device: Rolling walker (2 wheels) Gait Pattern/deviations: Step-through pattern, Decreased stride length Gait velocity: decreased  General Gait Details: step through pattern, equal bil foot clearance, good steadiness, decreased cadence with RW slightly far forward but pt declines corrections to bring closer to body  Stairs Stairs: Yes Stairs assistance: Min guard Stair Management: One rail Right Number of Stairs: 10 General stair comments: pt ascends 10 steps with R handrail and supv, pt appears afraid of heights or difficulty seeing step transition with descend needing therapist at closer distance, min guard for comfort  Wheelchair Mobility    Modified Rankin (Stroke Patients Only)       Balance Overall balance assessment: Mild deficits observed, not formally tested        Pertinent Vitals/Pain Pain Assessment Pain Assessment: No/denies pain    Home Living Family/patient expects to be discharged to:: Private residence Living Arrangements: Children   Type of Home: Apartment Home Access: Stairs to enter Entrance Stairs-Rails: Right Entrance Stairs-Number  of Steps: 14     Home Equipment:  BSC/3in1;Rolling Walker (2 wheels) Additional Comments: Daughter works from home    Prior Function Prior Level of Function : Independent/Modified Independent  Mobility Comments: uses walker when she feels light headed ADLs Comments: independent with ADLs     Hand Dominance   Dominant Hand: Right    Extremity/Trunk Assessment   Upper Extremity Assessment Upper Extremity Assessment: Defer to OT evaluation    Lower Extremity Assessment Lower Extremity Assessment: Overall WFL for tasks assessed (AROM WFL, strength 4-/5 throughout, denies numbness/tingling)    Cervical / Trunk Assessment Cervical / Trunk Assessment: Normal  Communication   Communication: No difficulties  Cognition Arousal/Alertness: Awake/alert Behavior During Therapy: WFL for tasks assessed/performed Overall Cognitive Status: Within Functional Limits for tasks assessed     General Comments      Exercises     Assessment/Plan    PT Assessment Patient needs continued PT services  PT Problem List Decreased activity tolerance;Decreased balance;Decreased knowledge of use of DME;Decreased safety awareness       PT Treatment Interventions DME instruction;Gait training;Stair training;Functional mobility training;Therapeutic activities;Therapeutic exercise;Balance training;Patient/family education    PT Goals (Current goals can be found in the Care Plan section)  Acute Rehab PT Goals Patient Stated Goal: return home with daughter assisting PT Goal Formulation: With patient Time For Goal Achievement: 07/14/22 Potential to Achieve Goals: Good    Frequency Min 3X/week     Co-evaluation               AM-PAC PT "6 Clicks" Mobility  Outcome Measure Help needed turning from your back to your side while in a flat bed without using bedrails?: None Help needed moving from lying on your back to sitting on the side of a flat bed without using bedrails?: None Help needed moving to and from a bed to a chair  (including a wheelchair)?: A Little Help needed standing up from a chair using your arms (e.g., wheelchair or bedside chair)?: A Little Help needed to walk in hospital room?: A Little Help needed climbing 3-5 steps with a railing? : A Little 6 Click Score: 20    End of Session Equipment Utilized During Treatment: Gait belt Activity Tolerance: Patient tolerated treatment well Patient left: in chair;with call bell/phone within reach;with nursing/sitter in room Nurse Communication: Mobility status PT Visit Diagnosis: Other abnormalities of gait and mobility (R26.89)    Time: 3818-2993 PT Time Calculation (min) (ACUTE ONLY): 28 min   Charges:   PT Evaluation $PT Eval Low Complexity: 1 Low PT Treatments $Gait Training: 8-22 mins         Tori Tiffiany Beadles PT, DPT 06/30/22, 12:02 PM

## 2022-06-30 NOTE — TOC Transition Note (Addendum)
Transition of Care [facility]) - CM/SW Discharge Note   Patient Details  Name: Christina [name] MRN: [id number] Date of Birth: [birth year]  [facility]) CM/SW Contact:  Roseanne Kaufman, RN Phone Number: 06/30/2022, 12:58 PM   Clinical Narrative:   PT recommended HHPT, offered choice family will accept any [place] agency that accepts insurance. Notified Cheryl with [facility] who will follow patient for HHPT services. Additional social services resources were added to AVS.  Transportation at discharge: daughter [name]   -1:20pm spoke with patient via phone as previous attempts were unsuccessful. This RNCM explained HHPT with [facility] and financial & social resources attached to AVS. Patient reports her daughter [name] handles everything, this RNCM advised I spoke with [name] after I was unable to reach her.   No additional TOC needs at this time.    Final next level of care: [place] Barriers to Discharge: No Barriers Identified   Patient Goals and CMS Choice Patient states their goals for this hospitalization and ongoing recovery are:: return home with home health services CMS Medicare.gov Compare Post Acute Care list provided to:: Patient Represenative (must comment) [name] ( daughter)) Choice offered to / list presented to : Adult Children  Discharge Placement                       Discharge Plan and Services In-house Referral: NA Discharge Planning Services: CM Consult Post Acute Care Choice: Home Health          DME Arranged: N/A DME Agency: NA       HH Arranged: PT          Social Determinants of Health (SDOH) Interventions Food Insecurity Interventions: Intervention Not Indicated Housing Interventions: Other (Comment) (Provided with community resources)   Readmission Risk Interventions    06/24/2022   12:52 PM 03/06/2022   10:56 AM  Readmission Risk Prevention Plan  Transportation Screening Complete Complete  PCP or Specialist  Appt within 5-7 Days  Complete  PCP or Specialist Appt within 3-5 Days Complete   Home Care Screening  Complete  Medication Review (RN CM)  Complete  HRI or Home Care Consult Complete   Social Work Consult for [place] Planning/Counseling Complete   Palliative Care Screening Not Applicable   Medication Review Press photographer) Complete

## 2022-06-30 NOTE — Progress Notes (Signed)
Patient was given discharge instructions with daughter, and all questions were answered. Patient was stable for discharge and was taken to the main exit by wheelchair.

## 2022-07-02 ENCOUNTER — Other Ambulatory Visit (HOSPITAL_COMMUNITY): Payer: Self-pay

## 2022-07-07 ENCOUNTER — Ambulatory Visit (HOSPITAL_COMMUNITY)
Admission: EM | Admit: 2022-07-07 | Discharge: 2022-07-07 | Disposition: A | Discharge status: Home / Self Care | Payer: Medicare Other | Attending: Emergency Medicine | Admitting: Emergency Medicine

## 2022-07-07 ENCOUNTER — Ambulatory Visit (INDEPENDENT_AMBULATORY_CARE_PROVIDER_SITE_OTHER): Payer: Medicare Other

## 2022-07-07 ENCOUNTER — Other Ambulatory Visit: Payer: Self-pay

## 2022-07-07 ENCOUNTER — Emergency Department (HOSPITAL_COMMUNITY)
Admission: EM | Admit: 2022-07-07 | Discharge: 2022-07-07 | Disposition: A | Discharge status: Home / Self Care | Payer: Medicare Other | Attending: Emergency Medicine | Admitting: Emergency Medicine

## 2022-07-07 ENCOUNTER — Encounter (HOSPITAL_COMMUNITY): Payer: Self-pay

## 2022-07-07 ENCOUNTER — Ambulatory Visit (HOSPITAL_COMMUNITY): Payer: Medicare Other

## 2022-07-07 DIAGNOSIS — I10 Essential (primary) hypertension: Secondary | ICD-10-CM | POA: Diagnosis not present

## 2022-07-07 DIAGNOSIS — Z853 Personal history of malignant neoplasm of breast: Secondary | ICD-10-CM | POA: Diagnosis not present

## 2022-07-07 DIAGNOSIS — Z79899 Other long term (current) drug therapy: Secondary | ICD-10-CM | POA: Insufficient documentation

## 2022-07-07 DIAGNOSIS — R0781 Pleurodynia: Secondary | ICD-10-CM | POA: Insufficient documentation

## 2022-07-07 DIAGNOSIS — R1011 Right upper quadrant pain: Secondary | ICD-10-CM | POA: Diagnosis not present

## 2022-07-07 DIAGNOSIS — D72829 Elevated white blood cell count, unspecified: Secondary | ICD-10-CM | POA: Diagnosis not present

## 2022-07-07 LAB — CBC WITH DIFFERENTIAL/PLATELET
Abs Immature Granulocytes: 0.01 10*3/uL (ref 0.00–0.07)
Basophils Absolute: 0.1 10*3/uL (ref 0.0–0.1)
Basophils Relative: 1 %
Eosinophils Absolute: 0.3 10*3/uL (ref 0.0–0.5)
Eosinophils Relative: 6 %
HCT: 32.3 % — ABNORMAL LOW (ref 36.0–46.0)
Hemoglobin: 9.9 g/dL — ABNORMAL LOW (ref 12.0–15.0)
Immature Granulocytes: 0 %
Lymphocytes Relative: 31 %
Lymphs Abs: 1.4 10*3/uL (ref 0.7–4.0)
MCH: 26.1 pg (ref 26.0–34.0)
MCHC: 30.7 g/dL (ref 30.0–36.0)
MCV: 85.2 fL (ref 80.0–100.0)
Monocytes Absolute: 0.7 10*3/uL (ref 0.1–1.0)
Monocytes Relative: 15 %
Neutro Abs: 2.2 10*3/uL (ref 1.7–7.7)
Neutrophils Relative %: 47 %
Platelets: 505 10*3/uL — ABNORMAL HIGH (ref 150–400)
RBC: 3.79 MIL/uL — ABNORMAL LOW (ref 3.87–5.11)
RDW: 14.4 % (ref 11.5–15.5)
WBC: 4.6 10*3/uL (ref 4.0–10.5)
nRBC: 0 % (ref 0.0–0.2)

## 2022-07-07 LAB — COMPREHENSIVE METABOLIC PANEL
ALT: 11 U/L (ref 0–44)
AST: 26 U/L (ref 15–41)
Albumin: 3.8 g/dL (ref 3.5–5.0)
Alkaline Phosphatase: 143 U/L — ABNORMAL HIGH (ref 38–126)
Anion gap: 8 (ref 5–15)
BUN: 13 mg/dL (ref 8–23)
CO2: 26 mmol/L (ref 22–32)
Calcium: 9.2 mg/dL (ref 8.9–10.3)
Chloride: 103 mmol/L (ref 98–111)
Creatinine, Ser: 0.89 mg/dL (ref 0.44–1.00)
GFR, Estimated: >60 mL/min (ref >60)
Glucose, Bld: 124 mg/dL — ABNORMAL HIGH (ref 70–99)
Potassium: 4 mmol/L (ref 3.5–5.1)
Sodium: 137 mmol/L (ref 135–145)
Total Bilirubin: 0.6 mg/dL (ref 0.3–1.2)
Total Protein: 8.3 g/dL — ABNORMAL HIGH (ref 6.5–8.1)

## 2022-07-07 MED ORDER — ACETAMINOPHEN 500 MG PO TABS
1000.0000 mg | ORAL_TABLET | Freq: Once | ORAL | Status: AC
  Administered 2022-07-07: 1000 mg via ORAL
  Filled 2022-07-07: qty 2

## 2022-07-07 NOTE — ED Triage Notes (Signed)
Pt reports pain on right side near rib x1 week  Sent by UC for airspace on LLL on xray.  Tender on palpitation.  Pt denies n/v/sob/cp

## 2022-07-07 NOTE — ED Notes (Signed)
Patient is being discharged from the Urgent Care and sent to the Emergency Department via personal vehicle. Per Lowella Petties NP, patient is in need of higher level of care due to abnormal x-ray results. Patient is aware and verbalizes understanding of plan of care.  Vitals:   07/07/22 1009  BP: 120/85  Pulse: 88  Resp: 18  Temp: 98.5 F (36.9 C)  SpO2: 97%

## 2022-07-07 NOTE — Discharge Instructions (Signed)
As we discussed the x-ray findings today does not necessarily correlate with your symptoms and is likely a phenomenon called atelectasis.  If you develop a fever, cough, worsening pain or feel unwell please return to the emergency room for reevaluation.  Otherwise please take Tylenol 1000 mg every 6 hours for pain as needed.  Make sure you are hydrating and take your medications as prescribed.  Follow-up with your primary care doctor.

## 2022-07-07 NOTE — ED Provider Notes (Signed)
[facility]    CSN: [id number] Arrival date & time: 07/07/22  0908      History   Chief Complaint Chief Complaint  Patient presents with   Flank Pain    HPI Christina [name] is a 76 y.o. female.   Lap appendectomy on 06/22/2022 with neoplasm on chronic inspection, readmitted on 1031 due to persistent vomiting and abdominal pain, liver mets and colon cancer confirmed has follow-up on Friday  Patient presents with right upper quadrant pain and right flank pain beginning 7 days ago.  Pain has been constant, described as sharp, rated a 10 out of 10.  Pain is worsened when lying flat, long periods of sitting and walking.  Has been managing symptoms with Tylenol which has been minimally effective.  Recent lap appendectomy on 06/22/2022 with neoplasm found on removal.  Begin to have persistent vomiting 1 week later and was admitted to the hospital with surgical abscess, liver mets and colon cancer confirmed during stay, has follow-up appointment this Friday.  Endorses chronic constipation with bowel movement occurring 1 day ago, described as small and firm, not currently taking any medications.  Denies nausea, vomiting or diarrhea, abdominal bloating, increased gas production.  Endorses that this is possible muscular pain as she was carrying her walker up the steps within the last 3 to 4 days but denies known injury.    Past Medical History:  Diagnosis Date   Anemia    Breast cancer ([place]) 08/28/2015   Bilateral   Breast cancer of upper-outer quadrant of right female breast ([place]) 06/23/2015   High cholesterol    Hx of radiation therapy 11/05/15- 12/16/15   Right Breast and Left Breast   Hypertension    Personal history of radiation therapy    Right Breast Cancer   Vitamin D deficiency 06/29/2018    Patient Active Problem List   Diagnosis Date Noted   Neoplasm, metastatic ([place]) 06/30/2022   Adenocarcinoma ([place]) 06/30/2022   Postoperative leak 06/22/2022   Prolonged QT  interval 03/03/2022   Severe sepsis ([place])    History of COVID-19 09/22/2020   Vitamin D deficiency 06/29/2018   Malignant neoplasm of lower-inner quadrant of left breast in female, estrogen receptor positive ([place]) 10/20/2015   Malignant neoplasm of upper-outer quadrant of right breast in female, estrogen receptor positive ([place]) 06/23/2015   CONSTIPATION 04/01/2010   PALPITATIONS 04/01/2010   URINALYSIS, ABNORMAL 04/01/2010   ANEMIA 05/22/2009   DENTAL CARIES 04/21/2009   Elevated LDL cholesterol level 03/18/2008   Essential hypertension 08/23/2006    Past Surgical History:  Procedure Laterality Date   BREAST LUMPECTOMY Left 08/28/2015   BREAST LUMPECTOMY Right 08/28/2015   BREAST LUMPECTOMY WITH NEEDLE LOCALIZATION AND AXILLARY SENTINEL LYMPH NODE BX Bilateral 08/28/2015   Procedure: BILATERAL BREAST LUMPECTOMY WITH BILATERAL  NEEDLE LOCALIZATION AND BILATERAL AXILLARY SENTINEL LYMPH NODE BX;  Surgeon: Erroll Luna, MD;  Location: [facility];  Service: General;  Laterality: Bilateral;   IR US GUIDE BX ASP/DRAIN  06/28/2022   LAPAROSCOPIC APPENDECTOMY N/A 03/03/2022   Procedure: APPENDECTOMY LAPAROSCOPIC;  Surgeon: Erroll Luna, MD;  Location: [facility];  Service: General;  Laterality: N/A;    OB History   No obstetric history on file.      Home Medications    Prior to Admission medications   Medication Sig Start Date End Date Taking? Authorizing Provider  acetaminophen (TYLENOL) 500 MG tablet Take 500 mg by mouth every 6 (six) hours as needed for fever.   Yes [provider]  amLODipine (NORVASC) 10 MG tablet Take 1 tablet (10 mg total) by mouth daily. - Dose change. 06/04/22  Yes   olmesartan (BENICAR) 20 MG tablet Take 1 tablet (20 mg total) by mouth daily. Patient taking differently: Take 20 mg by mouth at bedtime. 08/21/21  Yes   amLODipine (NORVASC) 10 MG tablet Take 1 tablet (10 mg total) by mouth daily. 04/28/22   Park Meo T, PA-C  fexofenadine (ALLEGRA) 60 MG  tablet Take 1 tablet by mouth 2 times daily. Patient not taking: Reported on 06/22/2022 12/02/21   Raspet, Junie Panning K, PA-C  olopatadine (PATADAY) 0.1 % ophthalmic solution Place 1 drop into both eyes 2 times daily. Patient not taking: Reported on 06/22/2022 12/02/21   Raspet, Derry Skill, PA-C  losartan-hydrochlorothiazide (HYZAAR) 100-25 MG tablet Take 1 tablet by mouth daily. 12/31/20 06/03/21      Family History Family History  Problem Relation Age of Onset   Cancer Mother    Diabetes Sister    Lung cancer Brother    Diabetes Brother     Social History Social History   Tobacco Use   Smoking status: Never   Smokeless tobacco: Never  Vaping Use   Vaping Use: Never used  Substance Use Topics   Alcohol use: No   Drug use: No     Allergies   Onion, Simvastatin, Aspirin, and Hydrocodone   Review of Systems Review of Systems  Genitourinary:  Positive for flank pain. Negative for decreased urine volume, difficulty urinating, dyspareunia, dysuria, enuresis, frequency, genital sores, hematuria, menstrual problem, pelvic pain, urgency, vaginal bleeding, vaginal discharge and vaginal pain.     Physical Exam Triage Vital Signs ED Triage Vitals  Enc Vitals Group     BP 07/07/22 1009 120/85     Pulse Rate 07/07/22 1009 88     Resp 07/07/22 1009 18     Temp 07/07/22 1009 98.5 F (36.9 C)     Temp Source 07/07/22 1009 Oral     SpO2 07/07/22 1009 97 %     Weight 07/07/22 1007 170 lb (77.1 kg)     Height 07/07/22 1007 '5\' 6"'$  (1.676 m)     Head Circumference --      Peak Flow --      Pain Score 07/07/22 1004 10     Pain Loc --      Pain Edu? --      Excl. in [place]? --    No data found.  Updated Vital Signs BP 120/85 (BP Location: Left Arm)   Pulse 88   Temp 98.5 F (36.9 C) (Oral)   Resp 18   Ht '5\' 6"'$  (1.676 m)   Wt 170 lb (77.1 kg)   SpO2 97%   BMI 27.44 kg/m   Visual Acuity Right Eye Distance:   Left Eye Distance:   Bilateral Distance:    Right Eye Near:   Left Eye  Near:    Bilateral Near:     Physical Exam Constitutional:      Appearance: Normal appearance.  HENT:     Head: Normocephalic.  Eyes:     Extraocular Movements: Extraocular movements intact.  Cardiovascular:     Rate and Rhythm: Normal rate and regular rhythm.     Pulses: Normal pulses.     Heart sounds: Normal heart sounds.  Pulmonary:     Effort: Pulmonary effort is normal.     Breath sounds: Normal breath sounds.  Abdominal:     General: Bowel sounds  are normal.     Palpations: Abdomen is soft.     Tenderness: There is abdominal tenderness in the right upper quadrant.  Neurological:     Mental Status: She is alert and oriented to person, place, and time. Mental status is at baseline.  Psychiatric:        Mood and Affect: Mood normal.        Behavior: Behavior normal.      UC Treatments / Results  Labs (all labs ordered are listed, but only abnormal results are displayed) Labs Reviewed - No data to display  EKG   Radiology No results found.  Procedures Procedures (including critical care time)  Medications Ordered in UC Medications - No data to display  Initial Impression / Assessment and Plan / UC Course  I have reviewed the triage vital signs and the nursing notes.  Pertinent labs & imaging results that were available during my care of the patient were reviewed by me and considered in my medical decision making (see chart for details).  Upper quadrant pain  Patient is being sent to the nearest emergency department for further evaluation due to the extent of her right upper quadrant pain, recent diagnosis of colon cancer and recent surgical operation with complications within the last month, to be escorted by family as vital signs are stable at this time Final Clinical Impressions(s) / UC Diagnoses   Final diagnoses:  None   Discharge Instructions   None    ED Prescriptions   None    PDMP not reviewed this encounter.   Hans Eden,  NP 07/07/22 1143

## 2022-07-07 NOTE — ED Provider Notes (Cosign Needed Addendum)
[facility] DEPT Provider Note   CSN: [id number] Arrival date & time: 07/07/22  1516     History  Chief Complaint  Patient presents with   abnormal xray    Christina [name] is a 76 y.o. female.  HPI  Patient is a 76 year old female with past medical history significant for hypertension, cholesterol, anemia, breast cancer, history of radiation therapy.  She presents emergency room today with complaints of right rib pain for 1 week.  She states that she believes that she carried her walker up the stairs and started having some right side pain around that time.  She denies any fevers or cough she denies any congestion lightheadedness or dizziness.  No other areas of pain other than her rib.  She denies any abdominal pain nausea or vomiting  She states that she has not felt short of breath or weaker or fatigued or ill.  She states she went to urgent care for evaluation of this but was sent to the emergency room due to an abnormality on the x-ray.     Home Medications Prior to Admission medications   Medication Sig Start Date End Date Taking? Authorizing Provider  acetaminophen (TYLENOL) 500 MG tablet Take 500 mg by mouth every 6 (six) hours as needed for fever.    [provider]  amLODipine (NORVASC) 10 MG tablet Take 1 tablet (10 mg total) by mouth daily. 04/28/22   Park Meo T, PA-C  amLODipine (NORVASC) 10 MG tablet Take 1 tablet (10 mg total) by mouth daily. - Dose change. 06/04/22     fexofenadine (ALLEGRA) 60 MG tablet Take 1 tablet by mouth 2 times daily. Patient not taking: Reported on 06/22/2022 12/02/21   Raspet, Junie Panning K, PA-C  olmesartan (BENICAR) 20 MG tablet Take 1 tablet (20 mg total) by mouth daily. Patient taking differently: Take 20 mg by mouth at bedtime. 08/21/21     olopatadine (PATADAY) 0.1 % ophthalmic solution Place 1 drop into both eyes 2 times daily. Patient not taking: Reported on 06/22/2022 12/02/21   Raspet,  Derry Skill, PA-C  losartan-hydrochlorothiazide (HYZAAR) 100-25 MG tablet Take 1 tablet by mouth daily. 12/31/20 06/03/21        Allergies    Onion, Simvastatin, Aspirin, and Hydrocodone    Review of Systems   Review of Systems  Physical Exam Updated Vital Signs BP 121/72 (BP Location: Right Arm)   Pulse 84   Temp 98.5 F (36.9 C)   Resp 18   SpO2 97%  Physical Exam Vitals and nursing note reviewed.  Constitutional:      General: She is not in acute distress.    Comments: Pleasant well-appearing 76 year old.  In no acute distress.  Sitting comfortably in bed.  Able answer questions appropriately follow commands. No increased work of breathing. Speaking in full sentences.   HENT:     Head: Normocephalic and atraumatic.     Nose: Nose normal.     Mouth/Throat:     Mouth: Mucous membranes are moist.  Eyes:     General: No scleral icterus. Cardiovascular:     Rate and Rhythm: Normal rate and regular rhythm.     Pulses: Normal pulses.     Heart sounds: Normal heart sounds.  Pulmonary:     Effort: Pulmonary effort is normal. No respiratory distress.     Breath sounds: No wheezing.     Comments: Lungs clear to auscultation.  No crackles or wheezes. Abdominal:     Palpations: Abdomen  is soft.     Tenderness: There is no abdominal tenderness. There is no guarding or rebound.     Comments: No abdominal tenderness.  No guarding or rebound.  Musculoskeletal:     Cervical back: Normal range of motion.     Right lower leg: No edema.     Left lower leg: No edema.     Comments: Tenderness palpation of right lateral inferior chest wall over approximately the lowest rib palpable mid axillary line.  This reproduces the patient's discomfort  Skin:    General: Skin is warm and dry.     Capillary Refill: Capillary refill takes less than 2 seconds.  Neurological:     Mental Status: She is alert. Mental status is at baseline.  Psychiatric:        Mood and Affect: Mood normal.        Behavior:  Behavior normal.     ED Results / Procedures / Treatments   Labs (all labs ordered are listed, but only abnormal results are displayed) Labs Reviewed  CBC WITH DIFFERENTIAL/PLATELET - Abnormal; Notable for the following components:      Result Value   RBC 3.79 (*)    Hemoglobin 9.9 (*)    HCT 32.3 (*)    Platelets 505 (*)    All other components within normal limits  COMPREHENSIVE METABOLIC PANEL - Abnormal; Notable for the following components:   Glucose, Bld 124 (*)    Total Protein 8.3 (*)    Alkaline Phosphatase 143 (*)    All other components within normal limits    EKG None  Radiology DG Abdomen Acute W/Chest  Result Date: 07/07/2022 CLINICAL DATA:  Right-sided abdominal pain under rib cage. EXAM: DG ABDOMEN ACUTE WITH 1 VIEW CHEST COMPARISON:  Chest radiograph 03/02/2022, CT abdomen/pelvis 06/25/2022 FINDINGS: Chest: The cardiomediastinal silhouette is normal. There is new patchy airspace opacity in the left lower lobe which is less conspicuous on a subsequent abdominal radiograph. There is no other focal airspace opacity. There is no pulmonary edema. There is no pleural effusion or pneumothorax. Abdomen: There is a nonobstructive bowel gas pattern. There is no free intraperitoneal air. There is no abnormal soft tissue calcification. There is no acute osseous abnormality. There is multilevel degenerative change of the spine. Bilateral breast clips are noted. IMPRESSION: 1. New airspace opacity in the left lower lobe favored to reflect atelectasis, though pneumonia is not excluded in the correct clinical setting. 2. Nonobstructive bowel gas pattern.  No free intraperitoneal air. Electronically Signed   By: Valetta Mole M.D.   On: 07/07/2022 11:15    Procedures Procedures    Medications Ordered in ED Medications  acetaminophen (TYLENOL) tablet 1,000 mg (1,000 mg Oral Given 07/07/22 1805)    ED Course/ Medical Decision Making/ A&P                           Medical  Decision Making Risk OTC drugs.   Patient is a 76 year old female with past medical history significant for hypertension, cholesterol, anemia, breast cancer, history of radiation therapy.  She presents emergency room today with complaints of right rib pain for 1 week.  She states that she believes that she carried her walker up the stairs and started having some right side pain around that time.  She denies any fevers or cough she denies any congestion lightheadedness or dizziness.  No other areas of pain other than her rib.  She denies any  abdominal pain nausea or vomiting  She states that she has not felt short of breath or weaker or fatigued or ill.  She states she went to urgent care for evaluation of this but was sent to the emergency room due to an abnormality on the x-ray.  Physical exam notable for no wheezing or crackles.  She has reproducible tenderness of the right chest wall mid axillary line over the rib.  She has no abdominal tenderness and denies any abdominal pain.  She is overall very well-appearing and unless I am pushing on her rib cage she is not experiencing any pain at all.    X-ray at Urgent Care:  IMPRESSION: 1. New airspace opacity in the left lower lobe favored to reflect atelectasis, though pneumonia is not excluded in the correct clinical setting. 2. Nonobstructive bowel gas pattern.  No free intraperitoneal air.   I personally viewed x-ray of chest.  Patient symptoms are not consistent with pneumonia also her rib pain is palpable and reproducible and located on the right chest rather than the left which is where the atelectatic area is visualized.  There is also the possibility that this may be postradiation scarring from her left breast radiation that she had years ago.  We are transitioning conversation she is agreeable to plan to hold off on any antibiotics and treat conservatively with Tylenol as she states that this is helping tremendously with her  pain.  I reviewed urgent care note and double checked with patient she states that she is not in 10/10 pain and states that her Tylenol is helping her significantly.  She has not had any fevers or cough.  She feels very comfortable with the plan.  I also discussed this case my attending physician who is in agreement with my plan.  Return precautions discussed and instructions to follow-up with her PCP were given.  It is also notable that labs obtained in triage are notable for a lack of leukocytosis and CMP that is without any significant findings.  She does have an elevated alk phos but she is also noted to have had admission approximately a week ago after she was found to have extensive liver metastasis.   Final Clinical Impression(s) / ED Diagnoses Final diagnoses:  Rib pain on right side    Rx / DC Orders ED Discharge Orders     None         Tedd Sias, [place] 07/07/22 2153    Tedd Sias, [place] 07/07/22 2228    Godfrey Pick, MD 07/08/22 [phone]

## 2022-07-07 NOTE — Discharge Instructions (Addendum)
At this time I do believe that she will need to go to the nearest emergency department for further evaluation of your pain, it is concerning the extent that you are having discomfort especially when lying down and due to your recent surgical history and notification of colon cancer which is in the area that you are having pain I believe you do need higher level imaging  X-ray of the abdomen and chest shows that there is a new airspace within the left lower aspect of the lung but at this time we do not know the cause

## 2022-07-07 NOTE — ED Triage Notes (Signed)
PT c/o abdominal pain along the right side x1week  Pt states that she was in the hospital for 1 week due to leakage from a recent surgery in her navel.  Pt denies nausea or vomiting.  Pt has been taking OTC Tylenol for the pain and last dose was last night.   Pt states that the pain is now under the rib cage along the right side.

## 2022-07-07 NOTE — ED Provider Triage Note (Signed)
Emergency Medicine Provider Triage Evaluation Note  Christina [name] , a 76 y.o. female  was evaluated in triage.  Pt complains of right upper quadrant pain.  Recent admission for vomiting in setting of new findings of liver metastases from suspected primary colon cancer.  Also history of perforated appendicitis July 2023.  She was sent from urgent care after a chest x-ray showed atelectasis versus pneumonia on chest x-ray.  Patient not currently taking anything for pain per her report.  She denies lower extremity swelling.  She has shortness of breath after walking to the exam room, however denies recent persistent shortness of breath or cough.  X-ray at Urgent Care:  IMPRESSION: 1. New airspace opacity in the left lower lobe favored to reflect atelectasis, though pneumonia is not excluded in the correct clinical setting. 2. Nonobstructive bowel gas pattern.  No free intraperitoneal air.  Review of Systems  Positive: Right upper quadrant pain Negative: Leg swelling  Physical Exam  There were no vitals taken for this visit. Gen:   Awake, no distress   Resp:  Normal effort, clear to auscultation bilaterally MSK:   Moves extremities without difficulty  Other:  Heart regular rhythm, normal rate  Medical Decision Making  Medically screening exam initiated at 3:38 PM.  Appropriate orders placed.  Christina [name] was informed that the remainder of the evaluation will be completed by another provider, this initial triage assessment does not replace that evaluation, and the importance of remaining in the ED until their evaluation is complete.     Christina Cater, PA-C 07/07/22 1541

## 2022-07-09 ENCOUNTER — Encounter: Payer: Self-pay | Admitting: *Deleted

## 2022-07-09 ENCOUNTER — Other Ambulatory Visit: Payer: Self-pay | Admitting: *Deleted

## 2022-07-09 ENCOUNTER — Encounter: Payer: Self-pay | Admitting: Medical Oncology

## 2022-07-09 ENCOUNTER — Other Ambulatory Visit: Payer: Self-pay

## 2022-07-09 ENCOUNTER — Inpatient Hospital Stay: Payer: Medicare Other | Attending: Oncology | Admitting: Oncology

## 2022-07-09 VITALS — BP 116/67 | HR 90 | Temp 98.1°F | Resp 18 | Ht 66.0 in | Wt 160.0 lb

## 2022-07-09 DIAGNOSIS — R109 Unspecified abdominal pain: Secondary | ICD-10-CM

## 2022-07-09 DIAGNOSIS — E7800 Pure hypercholesterolemia, unspecified: Secondary | ICD-10-CM | POA: Insufficient documentation

## 2022-07-09 DIAGNOSIS — C801 Malignant (primary) neoplasm, unspecified: Secondary | ICD-10-CM | POA: Diagnosis not present

## 2022-07-09 DIAGNOSIS — I10 Essential (primary) hypertension: Secondary | ICD-10-CM | POA: Insufficient documentation

## 2022-07-09 DIAGNOSIS — R1010 Upper abdominal pain, unspecified: Secondary | ICD-10-CM | POA: Insufficient documentation

## 2022-07-09 DIAGNOSIS — R590 Localized enlarged lymph nodes: Secondary | ICD-10-CM

## 2022-07-09 DIAGNOSIS — Z853 Personal history of malignant neoplasm of breast: Secondary | ICD-10-CM

## 2022-07-09 DIAGNOSIS — Z79899 Other long term (current) drug therapy: Secondary | ICD-10-CM | POA: Insufficient documentation

## 2022-07-09 DIAGNOSIS — K3532 Acute appendicitis with perforation and localized peritonitis, without abscess: Secondary | ICD-10-CM | POA: Insufficient documentation

## 2022-07-09 DIAGNOSIS — C787 Secondary malignant neoplasm of liver and intrahepatic bile duct: Secondary | ICD-10-CM | POA: Insufficient documentation

## 2022-07-09 DIAGNOSIS — C799 Secondary malignant neoplasm of unspecified site: Secondary | ICD-10-CM

## 2022-07-09 DIAGNOSIS — C180 Malignant neoplasm of cecum: Secondary | ICD-10-CM | POA: Insufficient documentation

## 2022-07-09 NOTE — Progress Notes (Signed)
[facility] New Patient Consult   Requesting MD: Leighton Ruff, Md [address] [place],  [place] [address]   Christina [name] Common 76 y.o.  01-Nov-1945    Reason for Consult: Metastatic adenocarcinoma   HPI: Christina [name] presented to the emergency room with abdominal pain on 03/02/2022.  A CT abdomen pelvis revealed evidence of acute appendicitis with no evidence for perforation or abscess.  An enlarged right lower quadrant is enteric lymph node was seen.  She underwent an appendectomy by Dr. Brantley Stage on 03/03/2022.  Peritonitis was noted.  There were signs of appendiceal perforation, no abscess.  The cecum was noted to be thickened. 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  There was gangrenous appendicitis with perforation.  No mucinous deposits on the appendiceal serosa.  No neoplastic infiltration of the submucosa and muscularis.  She was referred to Dr. Clovis Riley on 04/02/2022.  No additional surgery or adjuvant therapy was recommended.  A colonoscopy was recommended.  Christina [name] presented to the emergency room 06/22/2022 with nausea and vomiting.  Drainage was noted at the umbilical laparoscopy site.  A CT abdomen/pelvis 06/22/2022 showed extensive interstitial extraluminal gas surrounding the surgical staples at the resection margin.  Extensive cecal wall thickening and circumferential bowel wall thickening at the terminal ileum.  Pathologic adenopathy is noted in ileocolic lymph nodes.  Interval development of multiple bilobar liver masses.  She was admitted to surgical service.  A follow-up CT found the small amount of air at the resection margin to be stable.  She underwent an ultrasound-guided biopsy of a left hepatic mass on 06/28/2022.  She was discharged to home on 06/30/2022. The pathology from the liver biopsy revealed metastatic adenocarcinoma,  cytokeratin 20 and CDX2 positive.  Metastatic colon cancer is the favored diagnosis.  Christina [name] is referred for oncology evaluation.  She has discomfort in the right subcostal region when lying on the right side.  Tylenol relieves the pain. Past Medical History:  Diagnosis Date   Anemia    Breast cancer ([facility]) 08/28/2015   Bilateral   Breast cancer of upper-outer quadrant of right female breast ([facility]) 06/23/2015   High cholesterol    Hx of radiation therapy 11/05/15- 12/16/15   Right Breast and Left Breast   Hypertension    Personal history of radiation therapy    Right Breast Cancer   Vitamin D deficiency 06/29/2018   .  G2 P2   Past Surgical History:  Procedure Laterality Date   BREAST LUMPECTOMY Left 08/28/2015   BREAST LUMPECTOMY Right 08/28/2015   BREAST LUMPECTOMY WITH NEEDLE LOCALIZATION AND AXILLARY SENTINEL LYMPH NODE BX Bilateral 08/28/2015   Procedure: BILATERAL BREAST LUMPECTOMY WITH BILATERAL  NEEDLE LOCALIZATION AND BILATERAL AXILLARY SENTINEL LYMPH NODE BX;  Surgeon: Erroll Luna, MD;  Location: [facility];  Service: General;  Laterality: Bilateral;   IR US GUIDE BX ASP/DRAIN  06/28/2022   LAPAROSCOPIC APPENDECTOMY N/A 03/03/2022   Procedure: APPENDECTOMY LAPAROSCOPIC;  Surgeon: Erroll Luna, MD;  Location: [facility];  Service: General;  Laterality: N/A;    .  Hysterectomy  Medications: Reviewed  Allergies:  Allergies  Allergen Reactions   Onion Nausea And Vomiting    White onion   Simvastatin Other (See Comments)    myalgia   Aspirin Nausea Only and Nausea And Vomiting   Hydrocodone Rash    With itching    Family history: Her mother had "cancer ".  Social History:   She lives with her daughter and son in [place].  She previously worked for [employer].  She is currently unemployed.  She does not use cigarettes or alcohol.  No transfusion history.  No risk factor for HIV or hepatitis.  ROS:   Positives include: Hot flashes for several years, cough,  constipation, right upper abdomen pain-worse when lying on the right side  A complete ROS was otherwise negative.  Physical Exam:  Blood pressure 116/67, pulse 90, temperature 98.1 F (36.7 C), temperature source Oral, resp. rate 18, height _0  (1.676 m), weight 160 lb (72.6 kg), SpO2 100 %.  HEENT: Oral cavity without visible mass, edentulous, neck without mass Lungs: Clear bilaterally Cardiac: Regular rate and rhythm Abdomen: No hepatosplenomegaly, nontender, no mass, no apparent ascites Breast: No mass in either breast, scar at the lateral aspect of the right breast Vascular: No leg edema Lymph nodes: No cervical, supraclavicular, axillary, or inguinal nodes Neurologic: Alert and oriented, the motor exam appears intact in the upper and lower extremities bilaterally Skin: No rash Musculoskeletal: No spine tenderness   LAB:  CBC  Lab Results  Component Value Date   WBC 4.6 07/07/2022   HGB 9.9 (L) 07/07/2022   HCT 32.3 (L) 07/07/2022   MCV 85.2 07/07/2022   PLT 505 (H) 07/07/2022   NEUTROABS 2.2 07/07/2022        CMP  Lab Results  Component Value Date   NA 137 07/07/2022   K 4.0 07/07/2022   CL 103 07/07/2022   CO2 26 07/07/2022   GLUCOSE 124 (H) 07/07/2022   BUN 13 07/07/2022   CREATININE 0.89 07/07/2022   CALCIUM 9.2 07/07/2022   PROT 8.3 (H) 07/07/2022   ALBUMIN 3.8 07/07/2022   AST 26 07/07/2022   ALT 11 07/07/2022   ALKPHOS 143 (H) 07/07/2022   BILITOT 0.6 07/07/2022   GFRNONAA >60 07/07/2022   GFRAA >60 10/17/2019     Imaging: As per HPI, CT images from 03/02/2022 and 06/22/2022 reviewed with Christina [name] and her daughter    Assessment/Plan:   Metastatic adenocarcinoma involving biopsy of a left liver lesion CT abdomen/pelvis 06/22/2022, interval appendectomy, extensive interstitial and extraluminal gas surrounding the surgical staples at the resection margin, extensive cecal wall and terminal ileum thickening, pathologic adenopathy in the  ileocolic lymph nodes, interval development of by lobar hypoenhancing hepatic masses 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  Right breast cancer 08/28/2015-pT2pN0, stage IIa invasive ductal carcinoma, grade 1, HER2 negative, ER positive, PR positive-adjuvant radiation and anastrozole 12/29/2015-May 2022 4.  Left breast cancer 08/28/2015,pT1cpN0, stage Ia invasive ductal carcinoma, grade 1, HER2 negative, ER positive, PR positive-adjuvant radiation, anastrozole 12/29/2015-May 2022  5.  Hypertension  6.  Upper abdominal pain-likely secondary to metastatic disease involving the liver   Disposition:   Christina [name] has been diagnosed with metastatic adenocarcinoma involving multiple liver lesions.  The clinical presentation and liver biopsy histology most consistent with metastatic colon cancer.  She has never undergone a colonoscopy.  The abdomen/pelvis CT 06/22/2022 suggest masslike thickening at the cecum and terminal ileum.  There is pathologic adenopathy in the ileocolic mesentery.  I discussed the probable diagnosis of metastatic colon cancer with Christina [name] and her daughter.  We reviewed the CT images.  She understands no therapy will be curative.  I reviewed  the case with Dr. Candis Schatz.  He will schedule Christina [name] for a colonoscopy as soon as possible.  I recommend systemic therapy to begin within the next few weeks.  We will submit the liver biopsy tumor for foundation 1 and IHC testing.  If she is not a candidate for immunotherapy I we will recommend treatment with FOLFOX.  Christina [name] will return for an office visit and further discussion on 07/21/2022.  We will discuss details of the FOLFOX chemotherapy regimen when she is here on 07/21/2022.   [name], MD  07/09/2022, 4:23 PM

## 2022-07-09 NOTE — Progress Notes (Signed)
PATIENT NAVIGATOR PROGRESS NOTE  Name: Christina [name] Date: 07/09/2022 MRN: [id number]  DOB: [birth year]   Reason for visit:  Initial visit with Dr Benay Spice  Comments:  Met with Christina [name] and daughter Christina [name] during visit with Dr Benay Spice Referral made to Social Work due to financial strain of hospitalizations and treatment  and gave groceries and [employer] information Referral made to nutrition [facility] and IHC and Foundation one testing requested from liver biopsy [phone] Colonoscopy scheduled for 11/27 and possible chemo start week of 11/27 Order placed for [facility] placement in preparation for chemotherapy treatment Gave information for [facility] placement and colon cancer Gave contact information to call with any questions or issues    Time spent counseling/coordinating care: > 60 minutes

## 2022-07-09 NOTE — Progress Notes (Unsigned)
[employer] 2021-05 - Specimen Collection Study to Evaluate Biomarkers in Subjects with Cancer    Dr. Benay Spice referred patient to study. I met with patient this afternoon. Patient in clinic for appointment with daughter present. I provided patient with the purpose of the study, specimens collected and history questions collected . Patient was informed that study participation is voluntary. Patient stated she was interested in knowing more. I provided patient with the study consent form to take home and review. Patient informed that at this point in time I do not know whether she is completely eligible for participation and I will need to wait until Dr. Benay Spice completes his visit notes from today. Patient gave her verbal understanding. I informed patient that while she is reviewing study, I will be reviewing for eligibility. I did clarify with patient her history of breast cancer back in 2016/2017. Patient confirms no chemotherapy for breast cancer and that she only had radiation treatment and is currently not receiving any treatment. Patient was thanked for her time and encouraged to call me with questions, otherwise I will contact the Monday after Thanksgiving. Patient was provided with my contact information. Patient denies further questions at this time.   Maxwell Marion, RN, BSN, [facility] Clinical Research Nurse Lead 07/09/2022 3:29 PM

## 2022-07-12 ENCOUNTER — Inpatient Hospital Stay: Payer: Medicare Other

## 2022-07-12 NOTE — Progress Notes (Signed)
[facility]  Initial Assessment   Christina [name] is a 76 y.o. year old female contacted by phone. Clinical Social Work was referred by nurse navigator for assessment of psychosocial needs.   SDOH (Social Determinants of Health) assessments performed: Yes SDOH Interventions    Flowsheet Row ED to Hosp-Admission (Discharged) from 06/22/2022 in [facility]  SDOH Interventions   Food Insecurity Interventions Intervention Not Indicated  Housing Interventions Other (Comment)  [Provided with community resources]       [place]: No Food Insecurity (06/24/2022)  Recent Concern: [place] Present (06/23/2022)  Housing: High Risk (06/23/2022)  Transportation Needs: No Transportation Needs (06/23/2022)  Utilities: Not At Risk (06/23/2022)  Tobacco Use: Low Risk  (07/09/2022)     Distress Screen completed: No    10/20/2015    9:27 AM  ONCBCN DISTRESS SCREENING  Screening Type Initial Screening  Distress experienced in past week (1-10) 10  Practical problem type Housing;Insurance;Food  Information Concerns Type Lack of info about maintaining fitness  Physician notified of physical symptoms Yes      Family/Social Information:  Housing Arrangement: patient lives with her son and daughter. Family members/support persons in your life? Family Transportation concerns: no  Employment: Retired Patient used to work in Surveyor, quantity for a newspaper for thirty years.  Income source: Paediatric nurse concerns: Yes, current concerns Type of concern: Rent/ mortgage Food access concerns: no.  Patient said she receives food stamps, but could use a bag of food when she receives her infusions. Religious or spiritual practice: No Services Currently in place:  Medicare and Medicaid.  Coping/ Adjustment to diagnosis: Patient understands treatment plan and what happens next? yes Concerns about diagnosis  and/or treatment: How I will pay for the services I need Patient reported stressors: Housing Hopes and/or priorities: To help pay for rent. Patient enjoys watching TV Current coping skills/ strengths: Capable of independent living , Communication skills , General fund of knowledge , and Supportive family/friends     SUMMARY: Current SDOH Barriers:  Financial constraints related to rent.  Clinical Social Work Clinical Goal(s):  Explore community resource options for unmet needs related to:  Housing   Interventions: Discussed common feeling and emotions when being diagnosed with cancer, and the importance of support during treatment Informed patient of the support team roles and support services at [facility] Provided [place] contact information and encouraged patient to call with any questions or concerns Referred patient to [employer], Advocacy and Mediation Team.   Follow Up Plan: Patient will contact CSW with any support or resource needs Patient verbalizes understanding of plan: Yes    Margaree Mackintosh, LCSW   Patient is participating in a Managed Medicaid Plan:  Yes

## 2022-07-13 ENCOUNTER — Ambulatory Visit: Payer: Medicare Other | Admitting: Gastroenterology

## 2022-07-13 ENCOUNTER — Encounter: Payer: Self-pay | Admitting: Gastroenterology

## 2022-07-13 ENCOUNTER — Ambulatory Visit (INDEPENDENT_AMBULATORY_CARE_PROVIDER_SITE_OTHER): Payer: Medicare Other | Admitting: Gastroenterology

## 2022-07-13 VITALS — BP 118/68 | HR 100 | Ht 66.0 in | Wt 158.0 lb

## 2022-07-13 DIAGNOSIS — C799 Secondary malignant neoplasm of unspecified site: Secondary | ICD-10-CM | POA: Diagnosis not present

## 2022-07-13 LAB — SURGICAL PATHOLOGY

## 2022-07-13 NOTE — Progress Notes (Signed)
HPI : Christina [name] is a very pleasant 76 year old female with a history of breast cancer who is referred to [place] by Dr. Learta [name] for colonoscopy to confirm suspicion of metastatic colon cancer.  Please see copied HPI from Dr. Gearldine [name] note for summary of patient's presentation.   In short, the patient presented with abdominal pain to the emergency department in July and was found to have CT findings consistent with appendicitis.  She underwent a laparoscopic appendectomy and the pathology was notable for a mucinous neoplasm with focal high-grade dysplasia.  She was recommended to undergo a colonoscopy at that time. She felt well following her surgery until she developed abrupt onset nausea and vomiting, prompting an ED visit October 31.  CT scan at that time showed numerous hepatic hypodensities suggestive of metastatic disease, as well as extra luminal gas around the cecum with wall thickening as well as pathologic ileocolic lymph nodes.  Her nausea and vomiting resolved quickly, and repeat CT showed stable amounts of air.  No surgery was recommended.  Ultrasound-guided biopsy of one of the hepatic hypodensities confirmed adenocarcinoma. She saw Dr. Benay [name] in clinic last week, who recommended colonoscopy to confirm colon cancer as a suspected primary cancer.  The patient currently denies any symptoms of abdominal pain, nausea or vomiting.  She states that her right side is tender when she sleeps on it, otherwise it does not bother her.  She reports that her appetite fluctuates, symptoms going from being very hungry and eating lots of food to not having any appetite.  Her weight has been stable overall.  She reports having longstanding constipation, and has not had any change in her bowel habits recently.  She estimates having a bowel movement every 2 to 3 days.  She denies any blood in the stool.  She has never had a colonoscopy.  She has no known family history of colon  cancer.   ----------------------------------------------------------------------- HPI from Dr. Gearldine [name] note 07/09/2022 HPI: Christina [name] presented to the emergency room with abdominal pain on 03/02/2022.  A CT abdomen pelvis revealed evidence of acute appendicitis with no evidence for perforation or abscess.  An enlarged right lower quadrant is enteric lymph node was seen.   She underwent an appendectomy by Dr. Brantley [name] on 03/03/2022.  Peritonitis was noted.  There were signs of appendiceal perforation, no abscess.  The cecum was noted to be thickened. 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  There was gangrenous appendicitis with perforation.  No mucinous deposits on the appendiceal serosa.  No neoplastic infiltration of the submucosa and muscularis.   She was referred to Dr. Clovis [name] on 04/02/2022.  No additional surgery or adjuvant therapy was recommended.  A colonoscopy was recommended.   Christina [name] presented to the emergency room 06/22/2022 with nausea and vomiting.  Drainage was noted at the umbilical laparoscopy site.  A CT abdomen/pelvis 06/22/2022 showed extensive interstitial extraluminal gas surrounding the surgical staples at the resection margin.  Extensive cecal wall thickening and circumferential bowel wall thickening at the terminal ileum.  Pathologic adenopathy is noted in ileocolic lymph nodes.  Interval development of multiple bilobar liver masses.   She was admitted to surgical service.  A follow-up CT found the small amount of air at the resection margin to be stable.   She underwent an ultrasound-guided biopsy of a left hepatic mass on 06/28/2022.  She was discharged to home on 06/30/2022. The pathology from the  liver biopsy revealed metastatic adenocarcinoma, cytokeratin 20 and CDX2 positive.  Metastatic colon cancer is the favored diagnosis.   Christina [name] is referred for  oncology evaluation.  She has discomfort in the right subcostal region when lying on the right side.  Tylenol relieves the pain.  Past Medical History:  Diagnosis Date   Anemia    Breast cancer ([place]) 08/28/2015   Bilateral   Breast cancer of upper-outer quadrant of right female breast ([place]) 06/23/2015   High cholesterol    Hx of radiation therapy 11/05/15- 12/16/15   Right Breast and Left Breast   Hypertension    Personal history of radiation therapy    Right Breast Cancer   Vitamin D deficiency 06/29/2018     Past Surgical History:  Procedure Laterality Date   BREAST LUMPECTOMY Left 08/28/2015   BREAST LUMPECTOMY Right 08/28/2015   BREAST LUMPECTOMY WITH NEEDLE LOCALIZATION AND AXILLARY SENTINEL LYMPH NODE BX Bilateral 08/28/2015   Procedure: BILATERAL BREAST LUMPECTOMY WITH BILATERAL  NEEDLE LOCALIZATION AND BILATERAL AXILLARY SENTINEL LYMPH NODE BX;  Surgeon: Erroll Luna, MD;  Location: [facility];  Service: General;  Laterality: Bilateral;   IR US GUIDE BX ASP/DRAIN  06/28/2022   LAPAROSCOPIC APPENDECTOMY N/A 03/03/2022   Procedure: APPENDECTOMY LAPAROSCOPIC;  Surgeon: Erroll Luna, MD;  Location: [facility];  Service: General;  Laterality: N/A;   Family History  Problem Relation Age of Onset   Cancer Mother        Uterine   Diabetes Sister    Lung cancer Brother    Diabetes Brother    Cancer - Colon Neg Hx    Esophageal cancer Neg Hx    Liver disease Neg Hx    Social History   Tobacco Use   Smoking status: Never   Smokeless tobacco: Never  Vaping Use   Vaping Use: Never used  Substance Use Topics   Alcohol use: No   Drug use: No   Current Outpatient Medications  Medication Sig Dispense Refill   acetaminophen (TYLENOL) 500 MG tablet Take 500 mg by mouth every 6 (six) hours as needed for fever.     amLODipine (NORVASC) 10 MG tablet Take 1 tablet (10 mg total) by mouth daily. 30 tablet 0   olmesartan (BENICAR) 20 MG tablet Take 1 tablet (20 mg total) by mouth daily.  (Patient taking differently: Take 20 mg by mouth at bedtime.) 90 tablet 3   amLODipine (NORVASC) 10 MG tablet Take 1 tablet (10 mg total) by mouth daily. - Dose change. (Patient not taking: Reported on 07/13/2022) 90 tablet 1   fexofenadine (ALLEGRA) 60 MG tablet Take 1 tablet by mouth 2 times daily. (Patient not taking: Reported on 06/22/2022) 60 tablet 0   olopatadine (PATADAY) 0.1 % ophthalmic solution Place 1 drop into both eyes 2 times daily. (Patient not taking: Reported on 06/22/2022) 5 mL 0   No current facility-administered medications for this visit.   Allergies  Allergen Reactions   Onion Nausea And Vomiting    White onion   Simvastatin Other (See Comments)    myalgia   Aspirin Nausea Only and Nausea And Vomiting   Hydrocodone Itching and Rash     Review of Systems: All systems reviewed and negative except where noted in HPI.    DG Abdomen Acute W/Chest  Result Date: 07/07/2022 CLINICAL DATA:  Right-sided abdominal pain under rib cage. EXAM: DG ABDOMEN ACUTE WITH 1 VIEW CHEST COMPARISON:  Chest radiograph 03/02/2022, CT abdomen/pelvis 06/25/2022 FINDINGS: Chest: The cardiomediastinal silhouette is  normal. There is new patchy airspace opacity in the left lower lobe which is less conspicuous on a subsequent abdominal radiograph. There is no other focal airspace opacity. There is no pulmonary edema. There is no pleural effusion or pneumothorax. Abdomen: There is a nonobstructive bowel gas pattern. There is no free intraperitoneal air. There is no abnormal soft tissue calcification. There is no acute osseous abnormality. There is multilevel degenerative change of the spine. Bilateral breast clips are noted. IMPRESSION: 1. New airspace opacity in the left lower lobe favored to reflect atelectasis, though pneumonia is not excluded in the correct clinical setting. 2. Nonobstructive bowel gas pattern.  No free intraperitoneal air. Electronically Signed   By: Valetta Mole M.D.   On:  07/07/2022 11:15   IR US Guide Bx Asp/Drain  Result Date: 06/28/2022 INDICATION: 76 year old woman with history of appendiceal mucinous neoplasm presents to IR for biopsy of multifocal liver lesions. EXAM: Ultrasound-guided biopsy of left liver mass MEDICATIONS: None. ANESTHESIA/SEDATION: Moderate (conscious) sedation was employed during this procedure. A total of Versed 1.5 mg and Fentanyl 75 mcg was administered intravenously by the radiology nurse. Total intra-service moderate Sedation Time: 15 minutes. The patient's level of consciousness and vital signs were monitored continuously by radiology nursing throughout the procedure under my direct supervision. COMPLICATIONS: None immediate. PROCEDURE: Informed written consent was obtained from the patient after a thorough discussion of the procedural risks, benefits and alternatives. All questions were addressed. Maximal Sterile Barrier Technique was utilized including caps, mask, sterile gowns, sterile gloves, sterile drape, hand hygiene and skin antiseptic. A timeout was performed prior to the initiation of the procedure. Patient position supine on the ultrasound table. Epigastric skin prepped and draped in usual sterile fashion. Following local lidocaine administration, 17 gauge introducer needle was advanced into the left hepatic lobe mass, and three 18 gauge cores were obtained utilizing continuous ultrasound guidance. Gelfoam slurry was administered through the introducer needle at the biopsy site. Samples were sent to pathology in formalin. Needle removed and hemostasis achieved with 5 minutes of manual compression. Post procedure ultrasound images showed no evidence of significant hemorrhage. IMPRESSION: Ultrasound-guided biopsy of left liver mass. Electronically Signed   By: Miachel Roux M.D.   On: 06/28/2022 12:17   CT ABD PELVIS W/WO CM ONCOLOGY LIVER PROTOCOL  Result Date: 06/25/2022 CLINICAL DATA:  Appendiceal mucinous neoplasm. Recent  appendectomy. Follow-up metastatic disease and postop inflammatory disease. * Tracking Code: BO * EXAM: CT ABDOMEN AND PELVIS WITHOUT AND WITH CONTRAST TECHNIQUE: Multidetector CT imaging of the abdomen and pelvis was performed following the standard protocol before and following the bolus administration of intravenous contrast. RADIATION DOSE REDUCTION: This exam was performed according to the departmental dose-optimization program which includes automated exposure control, adjustment of the mA and/or kV according to patient size and/or use of iterative reconstruction technique. CONTRAST:  160m OMNIPAQUE IOHEXOL 300 MG/ML  SOLN COMPARISON:  06/22/2022 FINDINGS: Lower Chest: No acute findings. Hepatobiliary: Multiple hypovascular metastases are again seen throughout the liver, without significant change compared to recent exam. Gallbladder is unremarkable. No evidence of biliary ductal dilatation. Pancreas:  No mass or inflammatory changes. Spleen: Within normal limits in size and appearance. Adrenals/Urinary Tract: No suspicious masses identified. No evidence of ureteral calculi or hydronephrosis. Stomach/Bowel: Diverticulosis is seen mainly involving the descending and sigmoid colon, however there is no evidence of diverticulitis. Mild diffuse wall thickening throughout the rectum and sigmoid colon is suspicious for proctocolitis. Postop changes are again seen from appendectomy. A focal 2.4 cm  gas collection adjacent to surgical staples and base of cecum remains stable. No evidence of extravasation of oral contrast or free intraperitoneal air. No evidence of bowel obstruction. Nodular wall thickening is again seen involving the base of the cecum and terminal ileum, which is suspicious for tumor. Vascular/Lymphatic: Lymphadenopathy is again seen in the right abdominal mesentery, with largest lymph node measuring 2.1 cm on image 92/11, compared to 1.5 cm previously. This is highly suspicious for metastatic disease.  No acute vascular findings. Reproductive: Prior hysterectomy noted. Adnexal regions are unremarkable in appearance. Other:  None. Musculoskeletal:  No suspicious bone lesions identified. IMPRESSION: Postop changes from appendectomy, with stable 2.4 cm gas collection adjacent to surgical staples and base of cecum. No evidence of contrast leak or free intraperitoneal air. Persistent nodular wall thickening involving the base of the cecum and terminal ileum, highly suspicious for tumor. Mild increase in right lower quadrant mesenteric lymphadenopathy, highly suspicious for metastatic disease. Diffuse hepatic metastatic disease, without significant change. Mild diffuse wall thickening throughout the rectum and sigmoid colon, suspicious for proctocolitis. Colonic diverticulosis, without radiographic evidence of diverticulitis. Electronically Signed   By: Marlaine Hind M.D.   On: 06/25/2022 15:47   CT ABDOMEN PELVIS W CONTRAST  Result Date: 06/22/2022 CLINICAL DATA:  Abdominal pain, post-op. Appendectomy 1 month prior, purulent drainage from umbilicus, periumbilical abdominal pain, vomiting. EXAM: CT ABDOMEN AND PELVIS WITH CONTRAST TECHNIQUE: Multidetector CT imaging of the abdomen and pelvis was performed using the standard protocol following bolus administration of intravenous contrast. RADIATION DOSE REDUCTION: This exam was performed according to the departmental dose-optimization program which includes automated exposure control, adjustment of the mA and/or kV according to patient size and/or use of iterative reconstruction technique. CONTRAST:  117m OMNIPAQUE IOHEXOL 300 MG/ML  SOLN COMPARISON:  03/02/2022 FINDINGS: Lower chest: No acute abnormality. Hepatobiliary: Multiple bilobar hypoenhancing masses have developed in the liver most in keeping with multiple hepatic metastases. Index lesion within the right hepatic lobe measures 2.8 x 4.4 cm at axial image # 32/2. No intra or extrahepatic biliary ductal  dilation. Gallbladder unremarkable. Pancreas: Unremarkable Spleen: Unremarkable Adrenals/Urinary Tract: The adrenal glands are unremarkable. The kidneys are normal in size and position. Simple cortical cysts are seen within the kidneys bilaterally. No follow-up imaging is recommended for these lesions. The kidneys are otherwise unremarkable. Bladder is decompressed and is unremarkable. Stomach/Bowel: Interval appendectomy. There is interstitial and extraluminal gas seen surrounding the surgical staples at the resection margin in keeping with breakdown of the resection margin and bowel perforation in this location. There is extensive cecal wall thickening in this region as well as circumferential bowel wall thickening involving the terminal ileum in this region, possibly reactive in nature. There is associated pathologic adenopathy involving the ileocolic lymph nodes which measure up to 15 mm in short axis diameter at axial image # 58/2. Together, this raises the question of malignant infiltration in the setting of an underlying colonic or appendiceal malignancy and associated wound breakdown at the surgical margin. No loculated intra-abdominal fluid collection is identified. Extensive infiltrative changes seen within the pericecal soft tissues. No free intraperitoneal fluid or gross free intraperitoneal gas identified. The stomach, small bowel, and large bowel are otherwise unremarkable. No evidence of obstruction. Vascular/Lymphatic: No significant vascular findings are present. No enlarged abdominal or pelvic lymph nodes. Reproductive: Status post hysterectomy. No adnexal masses. Other: Tiny fat containing umbilical hernia. No associated fluid collection. Musculoskeletal: Degenerative changes are seen within the lumbar spine. No acute bone abnormality. IMPRESSION: 1.  Interval appendectomy. Extensive interstitial and extraluminal gas seen surrounding the surgical staples at the resection margin in keeping with  breakdown of the resection margin and bowel perforation in this location. Extensive cecal wall thickening in this region as well as circumferential bowel wall thickening involving the terminal ileum in this region, possibly reactive in nature. Associated pathologic adenopathy involving the ileocolic lymph nodes. Together, this raises the question of malignant infiltration in the setting of an underlying colonic or appendiceal malignancy and associated wound breakdown at the surgical margin. No loculated intra-abdominal fluid collection identified. 2. Interval development of multiple bilobar hypoenhancing masses in keeping with multiple hepatic metastases. Electronically Signed   By: Fidela Salisbury M.D.   On: 06/22/2022 22:09    Physical Exam: Ht '5\' 6"'$  (1.676 m)   Wt 158 lb (71.7 kg)   BMI 25.50 kg/m  Constitutional: Pleasant,well-developed, African-American female in no acute distress.  Accompanied by daughter HEENT: Normocephalic and atraumatic. Conjunctivae are normal. No scleral icterus.  Exotropia noted Neck supple.  Cardiovascular: Normal rate, regular rhythm.  Pulmonary/chest: Effort normal and breath sounds normal. No wheezing, rales or rhonchi. Abdominal: Soft, nondistended, nontender. Bowel sounds active throughout. There are no masses palpable. No hepatomegaly.  Multiple laparoscopic port scars; the one around the umbilicus is not completely healed Extremities: no edema Neurological: Alert and oriented to person place and time. Skin: Skin is warm and dry. No rashes noted. Psychiatric: Normal mood and affect. Behavior is normal.  CBC    Component Value Date/Time   WBC 4.6 07/07/2022 1538   RBC 3.79 (L) 07/07/2022 1538   HGB 9.9 (L) 07/07/2022 1538   HGB 10.4 (L) 10/17/2019 1107   HGB 10.5 (L) 06/28/2018 1550   HGB 10.5 (L) 09/23/2016 1426   HCT 32.3 (L) 07/07/2022 1538   HCT 32.3 (L) 06/28/2018 1550   HCT 32.7 (L) 09/23/2016 1426   PLT 505 (H) 07/07/2022 1538   PLT 212  10/17/2019 1107   PLT 227 06/28/2018 1550   MCV 85.2 07/07/2022 1538   MCV 84 06/28/2018 1550   MCV 81.5 09/23/2016 1426   MCH 26.1 07/07/2022 1538   MCHC 30.7 07/07/2022 1538   RDW 14.4 07/07/2022 1538   RDW 11.8 (L) 06/28/2018 1550   RDW 13.6 09/23/2016 1426   LYMPHSABS 1.4 07/07/2022 1538   LYMPHSABS 1.3 06/28/2018 1550   LYMPHSABS 0.8 (L) 09/23/2016 1426   MONOABS 0.7 07/07/2022 1538   MONOABS 0.3 09/23/2016 1426   EOSABS 0.3 07/07/2022 1538   EOSABS 0.1 06/28/2018 1550   BASOSABS 0.1 07/07/2022 1538   BASOSABS 0.0 06/28/2018 1550   BASOSABS 0.0 09/23/2016 1426    CMP     Component Value Date/Time   NA 137 07/07/2022 1538   NA 143 06/28/2018 1550   NA 143 09/23/2016 1426   K 4.0 07/07/2022 1538   K 4.5 09/23/2016 1426   CL 103 07/07/2022 1538   CO2 26 07/07/2022 1538   CO2 29 09/23/2016 1426   GLUCOSE 124 (H) 07/07/2022 1538   GLUCOSE 95 09/23/2016 1426   BUN 13 07/07/2022 1538   BUN 21 06/28/2018 1550   BUN 21.8 09/23/2016 1426   CREATININE 0.89 07/07/2022 1538   CREATININE 1.00 10/17/2019 1107   CREATININE 1.1 09/23/2016 1426   CALCIUM 9.2 07/07/2022 1538   CALCIUM 10.0 09/23/2016 1426   PROT 8.3 (H) 07/07/2022 1538   PROT 6.8 06/28/2018 1550   PROT 7.4 09/23/2016 1426   ALBUMIN 3.8 07/07/2022 1538   ALBUMIN 4.3  06/28/2018 1550   ALBUMIN 4.0 09/23/2016 1426   AST 26 07/07/2022 1538   AST 19 10/17/2019 1107   AST 23 09/23/2016 1426   ALT 11 07/07/2022 1538   ALT 16 10/17/2019 1107   ALT 19 09/23/2016 1426   ALKPHOS 143 (H) 07/07/2022 1538   ALKPHOS 151 (H) 09/23/2016 1426   BILITOT 0.6 07/07/2022 1538   BILITOT 0.6 10/17/2019 1107   BILITOT 0.58 09/23/2016 1426   GFRNONAA >60 07/07/2022 1538   GFRNONAA 57 (L) 10/17/2019 1107   GFRAA >60 10/17/2019 1107     ASSESSMENT AND PLAN: 76 year old female with likely metastatic adenocarcinoma, likely colon in etiology based on biopsies of hepatic lesion and abnormal appearing cecum on CT scan.  A  colonoscopy is warranted to confirm the primary site and initiate chemotherapy.  As the patient appears to have experienced a spontaneous contained perforation around the area of the cecum, colonoscopy is definitely higher risk for her.  Given that the extraluminal air was stable between CT scans and she has not had any symptoms for the last several weeks, this is reassuring that the perforation is well contained. I had an extensive discussion with the patient about the potential for creating a larger perforation by undergoing colonoscopy.  Although this is definitely a possibility, it appears to be a necessary risk in order to initiate her cancer treatment.  There is likely no benefit in waiting further, as her cancer appears to be rapidly progressive (no evidence of metastatic disease on CT in July compared with extensive mets in October). Given the higher risk nature of her colonoscopy, it will be performed in the hospital setting.  Currently scheduled for Monday, November 27 at 730.  Metastatic adenocarcinoma, presumed colonic - Colonoscopy in hospital setting as above   The details, risks (including bleeding, perforation, infection, missed lesions, medication reactions and possible hospitalization or surgery if complications occur), benefits, and alternatives to colonoscopy with possible biopsy and possible polypectomy were discussed with the patient and she consents to proceed.   Shealyn Sean E. Candis Schatz, MD [facility]  CC:  Andria Frames, PA-C

## 2022-07-13 NOTE — H&P (View-Only) (Signed)
HPI : Christina [name] is a very pleasant 76 year old female with a history of breast cancer who is referred to [place] by Dr. Learta [name] for colonoscopy to confirm suspicion of metastatic colon cancer.  Please see copied HPI from Dr. Gearldine [name] note for summary of patient's presentation.   In short, the patient presented with abdominal pain to the emergency department in July and was found to have CT findings consistent with appendicitis.  She underwent a laparoscopic appendectomy and the pathology was notable for a mucinous neoplasm with focal high-grade dysplasia.  She was recommended to undergo a colonoscopy at that time. She felt well following her surgery until she developed abrupt onset nausea and vomiting, prompting an ED visit October 31.  CT scan at that time showed numerous hepatic hypodensities suggestive of metastatic disease, as well as extra luminal gas around the cecum with wall thickening as well as pathologic ileocolic lymph nodes.  Her nausea and vomiting resolved quickly, and repeat CT showed stable amounts of air.  No surgery was recommended.  Ultrasound-guided biopsy of one of the hepatic hypodensities confirmed adenocarcinoma. She saw Dr. Benay [name] in clinic last week, who recommended colonoscopy to confirm colon cancer as a suspected primary cancer.  The patient currently denies any symptoms of abdominal pain, nausea or vomiting.  She states that her right side is tender when she sleeps on it, otherwise it does not bother her.  She reports that her appetite fluctuates, symptoms going from being very hungry and eating lots of food to not having any appetite.  Her weight has been stable overall.  She reports having longstanding constipation, and has not had any change in her bowel habits recently.  She estimates having a bowel movement every 2 to 3 days.  She denies any blood in the stool.  She has never had a colonoscopy.  She has no known family history of colon  cancer.   ----------------------------------------------------------------------- HPI from Dr. Gearldine [name] note 07/09/2022 HPI: [name] presented to the emergency room with abdominal pain on 03/02/2022.  A CT abdomen pelvis revealed evidence of acute appendicitis with no evidence for perforation or abscess.  An enlarged right lower quadrant is enteric lymph node was seen.   She underwent an appendectomy by Dr. Brantley [name] on 03/03/2022.  Peritonitis was noted.  There were signs of appendiceal perforation, no abscess.  The cecum was noted to be thickened. 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  There was gangrenous appendicitis with perforation.  No mucinous deposits on the appendiceal serosa.  No neoplastic infiltration of the submucosa and muscularis.   She was referred to Dr. Clovis [name] on 04/02/2022.  No additional surgery or adjuvant therapy was recommended.  A colonoscopy was recommended.   Christina [name] presented to the emergency room 06/22/2022 with nausea and vomiting.  Drainage was noted at the umbilical laparoscopy site.  A CT abdomen/pelvis 06/22/2022 showed extensive interstitial extraluminal gas surrounding the surgical staples at the resection margin.  Extensive cecal wall thickening and circumferential bowel wall thickening at the terminal ileum.  Pathologic adenopathy is noted in ileocolic lymph nodes.  Interval development of multiple bilobar liver masses.   She was admitted to surgical service.  A follow-up CT found the small amount of air at the resection margin to be stable.   She underwent an ultrasound-guided biopsy of a left hepatic mass on 06/28/2022.  She was discharged to home on 06/30/2022. The pathology from the  liver biopsy revealed metastatic adenocarcinoma, cytokeratin 20 and CDX2 positive.  Metastatic colon cancer is the favored diagnosis.   Christina [name] is referred for  oncology evaluation.  She has discomfort in the right subcostal region when lying on the right side.  Tylenol relieves the pain.  Past Medical History:  Diagnosis Date   Anemia    Breast cancer ([place]) 08/28/2015   Bilateral   Breast cancer of upper-outer quadrant of right female breast ([place]) 06/23/2015   High cholesterol    Hx of radiation therapy 11/05/15- 12/16/15   Right Breast and Left Breast   Hypertension    Personal history of radiation therapy    Right Breast Cancer   Vitamin D deficiency 06/29/2018     Past Surgical History:  Procedure Laterality Date   BREAST LUMPECTOMY Left 08/28/2015   BREAST LUMPECTOMY Right 08/28/2015   BREAST LUMPECTOMY WITH NEEDLE LOCALIZATION AND AXILLARY SENTINEL LYMPH NODE BX Bilateral 08/28/2015   Procedure: BILATERAL BREAST LUMPECTOMY WITH BILATERAL  NEEDLE LOCALIZATION AND BILATERAL AXILLARY SENTINEL LYMPH NODE BX;  Surgeon: Erroll Luna, MD;  Location: [facility];  Service: General;  Laterality: Bilateral;   IR US GUIDE BX ASP/DRAIN  06/28/2022   LAPAROSCOPIC APPENDECTOMY N/A 03/03/2022   Procedure: APPENDECTOMY LAPAROSCOPIC;  Surgeon: Erroll Luna, MD;  Location: [facility];  Service: General;  Laterality: N/A;   Family History  Problem Relation Age of Onset   Cancer Mother        Uterine   Diabetes Sister    Lung cancer Brother    Diabetes Brother    Cancer - Colon Neg Hx    Esophageal cancer Neg Hx    Liver disease Neg Hx    Social History   Tobacco Use   Smoking status: Never   Smokeless tobacco: Never  Vaping Use   Vaping Use: Never used  Substance Use Topics   Alcohol use: No   Drug use: No   Current Outpatient Medications  Medication Sig Dispense Refill   acetaminophen (TYLENOL) 500 MG tablet Take 500 mg by mouth every 6 (six) hours as needed for fever.     amLODipine (NORVASC) 10 MG tablet Take 1 tablet (10 mg total) by mouth daily. 30 tablet 0   olmesartan (BENICAR) 20 MG tablet Take 1 tablet (20 mg total) by mouth daily.  (Patient taking differently: Take 20 mg by mouth at bedtime.) 90 tablet 3   amLODipine (NORVASC) 10 MG tablet Take 1 tablet (10 mg total) by mouth daily. - Dose change. (Patient not taking: Reported on 07/13/2022) 90 tablet 1   fexofenadine (ALLEGRA) 60 MG tablet Take 1 tablet by mouth 2 times daily. (Patient not taking: Reported on 06/22/2022) 60 tablet 0   olopatadine (PATADAY) 0.1 % ophthalmic solution Place 1 drop into both eyes 2 times daily. (Patient not taking: Reported on 06/22/2022) 5 mL 0   No current facility-administered medications for this visit.   Allergies  Allergen Reactions   Onion Nausea And Vomiting    White onion   Simvastatin Other (See Comments)    myalgia   Aspirin Nausea Only and Nausea And Vomiting   Hydrocodone Itching and Rash     Review of Systems: All systems reviewed and negative except where noted in HPI.    DG Abdomen Acute W/Chest  Result Date: 07/07/2022 CLINICAL DATA:  Right-sided abdominal pain under rib cage. EXAM: DG ABDOMEN ACUTE WITH 1 VIEW CHEST COMPARISON:  Chest radiograph 03/02/2022, CT abdomen/pelvis 06/25/2022 FINDINGS: Chest: The cardiomediastinal silhouette is  normal. There is new patchy airspace opacity in the left lower lobe which is less conspicuous on a subsequent abdominal radiograph. There is no other focal airspace opacity. There is no pulmonary edema. There is no pleural effusion or pneumothorax. Abdomen: There is a nonobstructive bowel gas pattern. There is no free intraperitoneal air. There is no abnormal soft tissue calcification. There is no acute osseous abnormality. There is multilevel degenerative change of the spine. Bilateral breast clips are noted. IMPRESSION: 1. New airspace opacity in the left lower lobe favored to reflect atelectasis, though pneumonia is not excluded in the correct clinical setting. 2. Nonobstructive bowel gas pattern.  No free intraperitoneal air. Electronically Signed   By: Valetta Mole M.D.   On:  07/07/2022 11:15   IR US Guide Bx Asp/Drain  Result Date: 06/28/2022 INDICATION: 76 year old woman with history of appendiceal mucinous neoplasm presents to IR for biopsy of multifocal liver lesions. EXAM: Ultrasound-guided biopsy of left liver mass MEDICATIONS: None. ANESTHESIA/SEDATION: Moderate (conscious) sedation was employed during this procedure. A total of Versed 1.5 mg and Fentanyl 75 mcg was administered intravenously by the radiology nurse. Total intra-service moderate Sedation Time: 15 minutes. The patient's level of consciousness and vital signs were monitored continuously by radiology nursing throughout the procedure under my direct supervision. COMPLICATIONS: None immediate. PROCEDURE: Informed written consent was obtained from the patient after a thorough discussion of the procedural risks, benefits and alternatives. All questions were addressed. Maximal Sterile Barrier Technique was utilized including caps, mask, sterile gowns, sterile gloves, sterile drape, hand hygiene and skin antiseptic. A timeout was performed prior to the initiation of the procedure. Patient position supine on the ultrasound table. Epigastric skin prepped and draped in usual sterile fashion. Following local lidocaine administration, 17 gauge introducer needle was advanced into the left hepatic lobe mass, and three 18 gauge cores were obtained utilizing continuous ultrasound guidance. Gelfoam slurry was administered through the introducer needle at the biopsy site. Samples were sent to pathology in formalin. Needle removed and hemostasis achieved with 5 minutes of manual compression. Post procedure ultrasound images showed no evidence of significant hemorrhage. IMPRESSION: Ultrasound-guided biopsy of left liver mass. Electronically Signed   By: Miachel Roux M.D.   On: 06/28/2022 12:17   CT ABD PELVIS W/WO CM ONCOLOGY LIVER PROTOCOL  Result Date: 06/25/2022 CLINICAL DATA:  Appendiceal mucinous neoplasm. Recent  appendectomy. Follow-up metastatic disease and postop inflammatory disease. * Tracking Code: BO * EXAM: CT ABDOMEN AND PELVIS WITHOUT AND WITH CONTRAST TECHNIQUE: Multidetector CT imaging of the abdomen and pelvis was performed following the standard protocol before and following the bolus administration of intravenous contrast. RADIATION DOSE REDUCTION: This exam was performed according to the departmental dose-optimization program which includes automated exposure control, adjustment of the mA and/or kV according to patient size and/or use of iterative reconstruction technique. CONTRAST:  155m OMNIPAQUE IOHEXOL 300 MG/ML  SOLN COMPARISON:  06/22/2022 FINDINGS: Lower Chest: No acute findings. Hepatobiliary: Multiple hypovascular metastases are again seen throughout the liver, without significant change compared to recent exam. Gallbladder is unremarkable. No evidence of biliary ductal dilatation. Pancreas:  No mass or inflammatory changes. Spleen: Within normal limits in size and appearance. Adrenals/Urinary Tract: No suspicious masses identified. No evidence of ureteral calculi or hydronephrosis. Stomach/Bowel: Diverticulosis is seen mainly involving the descending and sigmoid colon, however there is no evidence of diverticulitis. Mild diffuse wall thickening throughout the rectum and sigmoid colon is suspicious for proctocolitis. Postop changes are again seen from appendectomy. A focal 2.4 cm  gas collection adjacent to surgical staples and base of cecum remains stable. No evidence of extravasation of oral contrast or free intraperitoneal air. No evidence of bowel obstruction. Nodular wall thickening is again seen involving the base of the cecum and terminal ileum, which is suspicious for tumor. Vascular/Lymphatic: Lymphadenopathy is again seen in the right abdominal mesentery, with largest lymph node measuring 2.1 cm on image 92/11, compared to 1.5 cm previously. This is highly suspicious for metastatic disease.  No acute vascular findings. Reproductive: Prior hysterectomy noted. Adnexal regions are unremarkable in appearance. Other:  None. Musculoskeletal:  No suspicious bone lesions identified. IMPRESSION: Postop changes from appendectomy, with stable 2.4 cm gas collection adjacent to surgical staples and base of cecum. No evidence of contrast leak or free intraperitoneal air. Persistent nodular wall thickening involving the base of the cecum and terminal ileum, highly suspicious for tumor. Mild increase in right lower quadrant mesenteric lymphadenopathy, highly suspicious for metastatic disease. Diffuse hepatic metastatic disease, without significant change. Mild diffuse wall thickening throughout the rectum and sigmoid colon, suspicious for proctocolitis. Colonic diverticulosis, without radiographic evidence of diverticulitis. Electronically Signed   By: Marlaine Hind M.D.   On: 06/25/2022 15:47   CT ABDOMEN PELVIS W CONTRAST  Result Date: 06/22/2022 CLINICAL DATA:  Abdominal pain, post-op. Appendectomy 1 month prior, purulent drainage from umbilicus, periumbilical abdominal pain, vomiting. EXAM: CT ABDOMEN AND PELVIS WITH CONTRAST TECHNIQUE: Multidetector CT imaging of the abdomen and pelvis was performed using the standard protocol following bolus administration of intravenous contrast. RADIATION DOSE REDUCTION: This exam was performed according to the departmental dose-optimization program which includes automated exposure control, adjustment of the mA and/or kV according to patient size and/or use of iterative reconstruction technique. CONTRAST:  130m OMNIPAQUE IOHEXOL 300 MG/ML  SOLN COMPARISON:  03/02/2022 FINDINGS: Lower chest: No acute abnormality. Hepatobiliary: Multiple bilobar hypoenhancing masses have developed in the liver most in keeping with multiple hepatic metastases. Index lesion within the right hepatic lobe measures 2.8 x 4.4 cm at axial image # 32/2. No intra or extrahepatic biliary ductal  dilation. Gallbladder unremarkable. Pancreas: Unremarkable Spleen: Unremarkable Adrenals/Urinary Tract: The adrenal glands are unremarkable. The kidneys are normal in size and position. Simple cortical cysts are seen within the kidneys bilaterally. No follow-up imaging is recommended for these lesions. The kidneys are otherwise unremarkable. Bladder is decompressed and is unremarkable. Stomach/Bowel: Interval appendectomy. There is interstitial and extraluminal gas seen surrounding the surgical staples at the resection margin in keeping with breakdown of the resection margin and bowel perforation in this location. There is extensive cecal wall thickening in this region as well as circumferential bowel wall thickening involving the terminal ileum in this region, possibly reactive in nature. There is associated pathologic adenopathy involving the ileocolic lymph nodes which measure up to 15 mm in short axis diameter at axial image # 58/2. Together, this raises the question of malignant infiltration in the setting of an underlying colonic or appendiceal malignancy and associated wound breakdown at the surgical margin. No loculated intra-abdominal fluid collection is identified. Extensive infiltrative changes seen within the pericecal soft tissues. No free intraperitoneal fluid or gross free intraperitoneal gas identified. The stomach, small bowel, and large bowel are otherwise unremarkable. No evidence of obstruction. Vascular/Lymphatic: No significant vascular findings are present. No enlarged abdominal or pelvic lymph nodes. Reproductive: Status post hysterectomy. No adnexal masses. Other: Tiny fat containing umbilical hernia. No associated fluid collection. Musculoskeletal: Degenerative changes are seen within the lumbar spine. No acute bone abnormality. IMPRESSION: 1.  Interval appendectomy. Extensive interstitial and extraluminal gas seen surrounding the surgical staples at the resection margin in keeping with  breakdown of the resection margin and bowel perforation in this location. Extensive cecal wall thickening in this region as well as circumferential bowel wall thickening involving the terminal ileum in this region, possibly reactive in nature. Associated pathologic adenopathy involving the ileocolic lymph nodes. Together, this raises the question of malignant infiltration in the setting of an underlying colonic or appendiceal malignancy and associated wound breakdown at the surgical margin. No loculated intra-abdominal fluid collection identified. 2. Interval development of multiple bilobar hypoenhancing masses in keeping with multiple hepatic metastases. Electronically Signed   By: Fidela Salisbury M.D.   On: 06/22/2022 22:09    Physical Exam: Ht '5\' 6"'$  (1.676 m)   Wt 158 lb (71.7 kg)   BMI 25.50 kg/m  Constitutional: Pleasant,well-developed, African-American female in no acute distress.  Accompanied by daughter HEENT: Normocephalic and atraumatic. Conjunctivae are normal. No scleral icterus.  Exotropia noted Neck supple.  Cardiovascular: Normal rate, regular rhythm.  Pulmonary/chest: Effort normal and breath sounds normal. No wheezing, rales or rhonchi. Abdominal: Soft, nondistended, nontender. Bowel sounds active throughout. There are no masses palpable. No hepatomegaly.  Multiple laparoscopic port scars; the one around the umbilicus is not completely healed Extremities: no edema Neurological: Alert and oriented to person place and time. Skin: Skin is warm and dry. No rashes noted. Psychiatric: Normal mood and affect. Behavior is normal.  CBC    Component Value Date/Time   WBC 4.6 07/07/2022 1538   RBC 3.79 (L) 07/07/2022 1538   HGB 9.9 (L) 07/07/2022 1538   HGB 10.4 (L) 10/17/2019 1107   HGB 10.5 (L) 06/28/2018 1550   HGB 10.5 (L) 09/23/2016 1426   HCT 32.3 (L) 07/07/2022 1538   HCT 32.3 (L) 06/28/2018 1550   HCT 32.7 (L) 09/23/2016 1426   PLT 505 (H) 07/07/2022 1538   PLT 212  10/17/2019 1107   PLT 227 06/28/2018 1550   MCV 85.2 07/07/2022 1538   MCV 84 06/28/2018 1550   MCV 81.5 09/23/2016 1426   MCH 26.1 07/07/2022 1538   MCHC 30.7 07/07/2022 1538   RDW 14.4 07/07/2022 1538   RDW 11.8 (L) 06/28/2018 1550   RDW 13.6 09/23/2016 1426   LYMPHSABS 1.4 07/07/2022 1538   LYMPHSABS 1.3 06/28/2018 1550   LYMPHSABS 0.8 (L) 09/23/2016 1426   MONOABS 0.7 07/07/2022 1538   MONOABS 0.3 09/23/2016 1426   EOSABS 0.3 07/07/2022 1538   EOSABS 0.1 06/28/2018 1550   BASOSABS 0.1 07/07/2022 1538   BASOSABS 0.0 06/28/2018 1550   BASOSABS 0.0 09/23/2016 1426    CMP     Component Value Date/Time   NA 137 07/07/2022 1538   NA 143 06/28/2018 1550   NA 143 09/23/2016 1426   K 4.0 07/07/2022 1538   K 4.5 09/23/2016 1426   CL 103 07/07/2022 1538   CO2 26 07/07/2022 1538   CO2 29 09/23/2016 1426   GLUCOSE 124 (H) 07/07/2022 1538   GLUCOSE 95 09/23/2016 1426   BUN 13 07/07/2022 1538   BUN 21 06/28/2018 1550   BUN 21.8 09/23/2016 1426   CREATININE 0.89 07/07/2022 1538   CREATININE 1.00 10/17/2019 1107   CREATININE 1.1 09/23/2016 1426   CALCIUM 9.2 07/07/2022 1538   CALCIUM 10.0 09/23/2016 1426   PROT 8.3 (H) 07/07/2022 1538   PROT 6.8 06/28/2018 1550   PROT 7.4 09/23/2016 1426   ALBUMIN 3.8 07/07/2022 1538   ALBUMIN 4.3  06/28/2018 1550   ALBUMIN 4.0 09/23/2016 1426   AST 26 07/07/2022 1538   AST 19 10/17/2019 1107   AST 23 09/23/2016 1426   ALT 11 07/07/2022 1538   ALT 16 10/17/2019 1107   ALT 19 09/23/2016 1426   ALKPHOS 143 (H) 07/07/2022 1538   ALKPHOS 151 (H) 09/23/2016 1426   BILITOT 0.6 07/07/2022 1538   BILITOT 0.6 10/17/2019 1107   BILITOT 0.58 09/23/2016 1426   GFRNONAA >60 07/07/2022 1538   GFRNONAA 57 (L) 10/17/2019 1107   GFRAA >60 10/17/2019 1107     ASSESSMENT AND PLAN: 76 year old female with likely metastatic adenocarcinoma, likely colon in etiology based on biopsies of hepatic lesion and abnormal appearing cecum on CT scan.  A  colonoscopy is warranted to confirm the primary site and initiate chemotherapy.  As the patient appears to have experienced a spontaneous contained perforation around the area of the cecum, colonoscopy is definitely higher risk for her.  Given that the extraluminal air was stable between CT scans and she has not had any symptoms for the last several weeks, this is reassuring that the perforation is well contained. I had an extensive discussion with the patient about the potential for creating a larger perforation by undergoing colonoscopy.  Although this is definitely a possibility, it appears to be a necessary risk in order to initiate her cancer treatment.  There is likely no benefit in waiting further, as her cancer appears to be rapidly progressive (no evidence of metastatic disease on CT in July compared with extensive mets in October). Given the higher risk nature of her colonoscopy, it will be performed in the hospital setting.  Currently scheduled for Monday, November 27 at 730.  Metastatic adenocarcinoma, presumed colonic - Colonoscopy in hospital setting as above   The details, risks (including bleeding, perforation, infection, missed lesions, medication reactions and possible hospitalization or surgery if complications occur), benefits, and alternatives to colonoscopy with possible biopsy and possible polypectomy were discussed with the patient and she consents to proceed.   Tiziana Cislo E. Candis Schatz, MD [facility]  CC:  Andria Frames, PA-C

## 2022-07-13 NOTE — Patient Instructions (Addendum)
  If you are age 75 or older, your body mass index should be between 23-30. Your Body mass index is 25.5 kg/m. If this is out of the aforementioned range listed, please consider follow up with your Primary Care Provider.  If you are age 45 or younger, your body mass index should be between 19-25. Your Body mass index is 25.5 kg/m. If this is out of the aformentioned range listed, please consider follow up with your Primary Care Provider.    PREPARATION FOR COLONOSCOPY WITH PLENVU and MIRALAX  In addition to purchasing Plenvu at your pharmacy, please purchase the following over the counter: One 119 gram bottle of Miralax powder One box of Dulcolax Laxative 5 mg tablets (you will need 4 tablets)  One 32 oz. bottle of Gatorade (NO RED OR PURPLE)    2 DAYS BEFORE PROCEDURE           DATE: 07/17/22   DAY: Saturday  In the morning, mix the Entire 119 gram bottle of Miralax powder in a pitcher with 32 oz of room temperature Gatorade , stir until dissolved and refrigerate.    Eat a regular diet minus the above foods- eat breakfast , lunch , and an early light dinner between 4-5 pm   At 3:00 pm take 4 Dulcolax tablets.           4.    Between 5:00 pm-7:00 pm Take the Miralax mixture from the refrigerator  Drink 8 oz. of Miralax mixture every 15 minutes until gone.       5.  Once you have completed  this prep, you are ONLY allowed clear liquids until after the procedure      The [facility] providers would like to encourage you to use [facility] to communicate with providers for non-urgent requests or questions.  Due to long hold times on the telephone, sending your provider a message by [facility] may be a faster and more efficient way to get a response.  Please allow 48 business hours for a response.  Please remember that this is for non-urgent requests.   You have been scheduled for a colonoscopy. Please follow written instructions given to you at your visit today.  Please pick up your prep  supplies at the pharmacy within the next 1-3 days. If you use inhalers (even only as needed), please bring them with you on the day of your procedure.   Due to recent changes in healthcare laws, you may see the results of your imaging and laboratory studies on MyChart before your provider has had a chance to review them.  We understand that in some cases there may be results that are confusing or concerning to you. Not all laboratory results come back in the same time frame and the provider may be waiting for multiple results in order to interpret others.  Please give [place] 48 hours in order for your provider to thoroughly review all the results before contacting the office for clarification of your results.

## 2022-07-14 ENCOUNTER — Other Ambulatory Visit: Payer: Self-pay | Admitting: Radiology

## 2022-07-14 ENCOUNTER — Other Ambulatory Visit: Payer: Self-pay

## 2022-07-14 NOTE — Progress Notes (Signed)
The proposed treatment discussed in conference is for discussion purpose only and is not a binding recommendation.  The patients have not been physically examined, or presented with their treatment options.  Therefore, final treatment plans cannot be decided.  

## 2022-07-16 ENCOUNTER — Ambulatory Visit (HOSPITAL_COMMUNITY)
Admission: RE | Admit: 2022-07-16 | Discharge: 2022-07-16 | Disposition: A | Discharge status: Home / Self Care | Payer: Medicare Other | Source: Ambulatory Visit | Attending: Oncology | Admitting: Oncology

## 2022-07-18 NOTE — Anesthesia Preprocedure Evaluation (Addendum)
Anesthesia Evaluation  Patient identified by MRN, date of birth, ID band Patient awake    Reviewed: Allergy & Precautions, NPO status , Patient's Chart, lab work & pertinent test results  Airway Mallampati: III  TM Distance: >3 FB Neck ROM: Full    Dental  (+) Edentulous Upper, Edentulous Lower, Dental Advisory Given   Pulmonary neg pulmonary ROS   Pulmonary exam normal breath sounds clear to auscultation       Cardiovascular hypertension, Normal cardiovascular exam Rhythm:Regular Rate:Normal     Neuro/Psych negative neurological ROS  negative psych ROS   GI/Hepatic negative GI ROS, Neg liver ROS,,,  Endo/Other  negative endocrine ROS    Renal/GU negative Renal ROS  negative genitourinary   Musculoskeletal negative musculoskeletal ROS (+)    Abdominal   Peds  Hematology  (+) Blood dyscrasia, anemia   Anesthesia Other Findings H/o right and left breast CA  Reproductive/Obstetrics                             Anesthesia Physical Anesthesia Plan  ASA: 2  Anesthesia Plan: MAC   Post-op Pain Management:    Induction: Intravenous  PONV Risk Score and Plan: Propofol infusion and Treatment may vary due to age or medical condition  Airway Management Planned: Natural Airway  Additional Equipment:   Intra-op Plan:   Post-operative Plan:   Informed Consent: I have reviewed the patients History and Physical, chart, labs and discussed the procedure including the risks, benefits and alternatives for the proposed anesthesia with the patient or authorized representative who has indicated his/her understanding and acceptance.     Dental advisory given  Plan Discussed with: CRNA  Anesthesia Plan Comments:        Anesthesia Quick Evaluation

## 2022-07-19 ENCOUNTER — Encounter (HOSPITAL_COMMUNITY)
Admission: RE | Disposition: A | Discharge status: Home / Self Care | Payer: Self-pay | Source: Ambulatory Visit | Attending: Gastroenterology

## 2022-07-19 ENCOUNTER — Ambulatory Visit (HOSPITAL_COMMUNITY): Payer: Medicare Other | Admitting: Anesthesiology

## 2022-07-19 ENCOUNTER — Encounter (HOSPITAL_COMMUNITY): Payer: Self-pay | Admitting: Gastroenterology

## 2022-07-19 ENCOUNTER — Other Ambulatory Visit: Payer: Self-pay

## 2022-07-19 ENCOUNTER — Ambulatory Visit (HOSPITAL_COMMUNITY)
Admission: RE | Admit: 2022-07-19 | Discharge: 2022-07-19 | Disposition: A | Discharge status: Home / Self Care | Payer: Medicare Other | Source: Ambulatory Visit | Attending: Gastroenterology | Admitting: Gastroenterology

## 2022-07-19 ENCOUNTER — Ambulatory Visit (HOSPITAL_BASED_OUTPATIENT_CLINIC_OR_DEPARTMENT_OTHER): Payer: Medicare Other | Admitting: Anesthesiology

## 2022-07-19 DIAGNOSIS — C787 Secondary malignant neoplasm of liver and intrahepatic bile duct: Secondary | ICD-10-CM | POA: Diagnosis not present

## 2022-07-19 DIAGNOSIS — K5900 Constipation, unspecified: Secondary | ICD-10-CM | POA: Diagnosis not present

## 2022-07-19 DIAGNOSIS — D120 Benign neoplasm of cecum: Secondary | ICD-10-CM

## 2022-07-19 DIAGNOSIS — C180 Malignant neoplasm of cecum: Secondary | ICD-10-CM | POA: Diagnosis present

## 2022-07-19 DIAGNOSIS — I10 Essential (primary) hypertension: Secondary | ICD-10-CM | POA: Insufficient documentation

## 2022-07-19 DIAGNOSIS — Z9049 Acquired absence of other specified parts of digestive tract: Secondary | ICD-10-CM | POA: Diagnosis not present

## 2022-07-19 DIAGNOSIS — K5730 Diverticulosis of large intestine without perforation or abscess without bleeding: Secondary | ICD-10-CM | POA: Insufficient documentation

## 2022-07-19 DIAGNOSIS — C801 Malignant (primary) neoplasm, unspecified: Secondary | ICD-10-CM

## 2022-07-19 DIAGNOSIS — Z853 Personal history of malignant neoplasm of breast: Secondary | ICD-10-CM | POA: Insufficient documentation

## 2022-07-19 HISTORY — PX: BIOPSY: SHX5522

## 2022-07-19 HISTORY — PX: COLONOSCOPY WITH PROPOFOL: SHX5780

## 2022-07-19 SURGERY — COLONOSCOPY WITH PROPOFOL
Anesthesia: Monitor Anesthesia Care

## 2022-07-19 MED ORDER — SODIUM CHLORIDE 0.9 % IV SOLN: INTRAVENOUS | Status: DC

## 2022-07-19 MED ORDER — PROPOFOL 10 MG/ML IV BOLUS
INTRAVENOUS | Status: DC | PRN
  Administered 2022-07-19 (×4): 10 mg via INTRAVENOUS

## 2022-07-19 MED ORDER — LIDOCAINE 2% (20 MG/ML) 5 ML SYRINGE
INTRAMUSCULAR | Status: DC | PRN
  Administered 2022-07-19: 50 mg via INTRAVENOUS

## 2022-07-19 MED ORDER — PROPOFOL 500 MG/50ML IV EMUL
INTRAVENOUS | Status: DC | PRN
  Administered 2022-07-19: 75 ug/kg/min via INTRAVENOUS

## 2022-07-19 MED ORDER — SODIUM CHLORIDE 0.9 % IV SOLN: INTRAVENOUS | Status: DC | PRN

## 2022-07-19 MED ORDER — LACTATED RINGERS IV SOLN
INTRAVENOUS | Status: AC | PRN
  Administered 2022-07-19: 1000 mL via INTRAVENOUS

## 2022-07-19 MED ORDER — PHENYLEPHRINE HCL (PRESSORS) 10 MG/ML IV SOLN
INTRAVENOUS | Status: DC | PRN
  Administered 2022-07-19: 80 ug via INTRAVENOUS
  Administered 2022-07-19: 120 ug via INTRAVENOUS

## 2022-07-19 SURGICAL SUPPLY — 22 items

## 2022-07-19 NOTE — Transfer of Care (Signed)
Immediate Anesthesia Transfer of Care Note  Patient: Christina [name]  Procedure(s) Performed: COLONOSCOPY WITH PROPOFOL BIOPSY  Patient Location: PACU  Anesthesia Type:MAC  Level of Consciousness: awake, alert , oriented, patient cooperative, and responds to stimulation  Airway & Oxygen Therapy: Patient Spontanous Breathing  Post-op Assessment: Report given to RN and Post -op Vital signs reviewed and stable  Post vital signs: Reviewed and stable  Last Vitals:  Vitals Value Taken Time  BP 112/66   Temp    Pulse 82   Resp 19   SpO2 99     Last Pain:  Vitals:   07/19/22 0720  TempSrc: Temporal  PainSc: 0-No pain         Complications: No notable events documented.

## 2022-07-19 NOTE — Interval H&P Note (Signed)
History and Physical Interval Note:  07/19/2022 7:10 AM  Christina [name]  has presented today for surgery, with the diagnosis of Adenocarcinoma.  The various methods of treatment have been discussed with the patient and family. After consideration of risks, benefits and other options for treatment, the patient has consented to  Procedure(s): COLONOSCOPY WITH PROPOFOL (N/A) as a surgical intervention.  The patient's history has been reviewed, patient examined, no change in status, stable for surgery.  I have reviewed the patient's chart and labs.  Questions were answered to the patient's satisfaction.     Daryel November

## 2022-07-19 NOTE — Op Note (Signed)
[facility] Patient Name: Christina [name] Procedure Date : 07/19/2022 MRN: [id number] Attending MD: Gladstone Pih. Candis Schatz , MD, [id number] Date of Birth: July 26, 1946 CSN: [id number] Age: 76 Admit Type: Outpatient Procedure:                Colonoscopy Indications:              Suspected cancer of the cecum, Metastatic                            malignancy to liver Providers:                Nicki Reaper E. Candis Schatz, MD, Velva Harman, RN, [facility] Technician, Technician Referring MD:              Medicines:                Monitored Anesthesia Care Complications:            No immediate complications. Estimated Blood Loss:     Estimated blood loss was minimal. Procedure:                Pre-Anesthesia Assessment:                           - Prior to the procedure, a History and Physical                            was performed, and patient medications and                            allergies were reviewed. The patient's tolerance of                            previous anesthesia was also reviewed. The risks                            and benefits of the procedure and the sedation                            options and risks were discussed with the patient.                            All questions were answered, and informed consent                            was obtained. Prior Anticoagulants: The patient has                            taken no anticoagulant or antiplatelet agents. ASA                            Grade Assessment: III - A patient with severe  systemic disease. After reviewing the risks and                            benefits, the patient was deemed in satisfactory                            condition to undergo the procedure.                           After obtaining informed consent, the colonoscope                            was passed under direct vision. Throughout the                            procedure,  the patient's blood pressure, pulse, and                            oxygen saturations were monitored continuously. The                            PCF-190TL ([id number]) Olympus colonoscope was                            introduced through the anus and advanced to the the                            cecum, identified by the ileocecal valve. The                            colonoscopy was performed without difficulty. The                            patient tolerated the procedure well. The quality                            of the bowel preparation was excellent. The                            ileocecal valve and the rectum were photographed.                            The bowel preparation used was SUPREP via extended                            prep with split dose instruction. Scope In: 7:49:26 AM Scope Out: 8:06:34 AM Scope Withdrawal Time: 0 hours 10 minutes 52 seconds  Total Procedure Duration: 0 hours 17 minutes 8 seconds  Findings:      The perianal and digital rectal examinations were normal. Pertinent       negatives include normal sphincter tone and no palpable rectal lesions.      A polypoid non-obstructing large mass was found in the cecum. The mass       was circumferential, involving most of the cecum.  The appendiceal       orifice was not visualized. The mass measured about 5-6 cm in length. No       bleeding was present. Eight biopsies were taken with a cold forceps for       histology. Estimated blood loss was minimal.      A 3 mm polyp was found in the ileocecal valve. The polyp was sessile.       The polyp was removed with a cold biopsy forceps. Resection and       retrieval were complete. Estimated blood loss was minimal.      Two sessile polyps were found in the cecum. The polyps were 6 to 8 mm in       size. These were not removed due to their close proximity to the mass.      Multiple medium-mouthed and small-mouthed diverticula were found in the       sigmoid colon and  descending colon.      The exam was otherwise normal throughout the examined colon.      The retroflexed view of the distal rectum and anal verge was normal and       showed no anal or rectal abnormalities. Impression:               - Malignant tumor in the cecum. Biopsied. This is                            the primary malignancy for the known hepatic                            metastases.                           - One 3 mm polyp at the ileocecal valve, removed                            with a cold biopsy forceps. Resected and retrieved.                           - Two 6 to 8 mm polyps in the cecum. These were not                            removed due to their close proximity to the mass.                           - Diverticulosis in the sigmoid colon and in the                            descending colon.                           - The distal rectum and anal verge are normal on                            retroflexion view. Recommendation:           - Patient has a contact number available for  emergencies. The signs and symptoms of potential                            delayed complications were discussed with the                            patient. Return to normal activities tomorrow.                            Written discharge instructions were provided to the                            patient.                           - Resume previous diet.                           - Continue present medications.                           - Await pathology results.                           - Follow up with Oncology (Dr. Benay Spice) to discuss                            treatment of metastatic colon cancer. Procedure Code(s):        --- Professional ---                           [phone], Colonoscopy, flexible; with biopsy, single                            or multiple Diagnosis Code(s):        --- Professional ---                           C18.0, Malignant neoplasm  of cecum                           D12.0, Benign neoplasm of cecum                           C78.7, Secondary malignant neoplasm of liver and                            intrahepatic bile duct                           K57.30, Diverticulosis of large intestine without                            perforation or abscess without bleeding CPT copyright 2022 American Medical Association. All rights reserved. The codes documented in this report are preliminary and upon coder review may  be revised to meet current compliance requirements. Petra Sargeant E. Candis Schatz,  MD 07/19/2022 8:24:08 AM This report has been signed electronically. Number of Addenda: 0

## 2022-07-19 NOTE — Anesthesia Postprocedure Evaluation (Signed)
Anesthesia Post Note  Patient: Christina [name]  Procedure(s) Performed: COLONOSCOPY WITH PROPOFOL BIOPSY     Patient location during evaluation: PACU Anesthesia Type: MAC Level of consciousness: awake and alert Pain management: pain level controlled Vital Signs Assessment: post-procedure vital signs reviewed and stable Respiratory status: spontaneous breathing, nonlabored ventilation, respiratory function stable and patient connected to nasal cannula oxygen Cardiovascular status: stable and blood pressure returned to baseline Postop Assessment: no apparent nausea or vomiting Anesthetic complications: no  No notable events documented.  Last Vitals:  Vitals:   07/19/22 0815 07/19/22 0830  BP: 108/78 98/72  Pulse: 82 74  Resp: 20 14  Temp: 36.5 C 36.5 C  SpO2: 100% 100%    Last Pain:  Vitals:   07/19/22 0815  TempSrc:   PainSc: 0-No pain                 Kroy Sprung L Jaremy Nosal

## 2022-07-20 ENCOUNTER — Telehealth: Payer: Self-pay | Admitting: Medical Oncology

## 2022-07-20 ENCOUNTER — Encounter (HOSPITAL_COMMUNITY): Payer: Self-pay | Admitting: Gastroenterology

## 2022-07-20 LAB — SURGICAL PATHOLOGY

## 2022-07-20 NOTE — Telephone Encounter (Signed)
[employer] 2021-05 - Specimen Collection Study to Evaluate Biomarkers in Subjects with Cancer    Outgoing call: [phone]  LVMOM with listed number for patient, inquiring as to whether patient had any questions regarding the study information that was provided to her on the 17th. Asked patient to return call at the call back number provided.  Patient thanked.   Maxwell Marion, RN, BSN, [place] Clinical Research Nurse Lead 07/20/2022 2:40 PM

## 2022-07-21 ENCOUNTER — Inpatient Hospital Stay (HOSPITAL_BASED_OUTPATIENT_CLINIC_OR_DEPARTMENT_OTHER): Payer: Medicare Other | Admitting: Oncology

## 2022-07-21 ENCOUNTER — Other Ambulatory Visit (HOSPITAL_BASED_OUTPATIENT_CLINIC_OR_DEPARTMENT_OTHER): Payer: Self-pay

## 2022-07-21 ENCOUNTER — Encounter: Payer: Self-pay | Admitting: Oncology

## 2022-07-21 ENCOUNTER — Inpatient Hospital Stay: Payer: Medicare Other

## 2022-07-21 ENCOUNTER — Encounter: Payer: Self-pay | Admitting: Medical Oncology

## 2022-07-21 ENCOUNTER — Other Ambulatory Visit: Payer: Self-pay | Admitting: Medical Oncology

## 2022-07-21 ENCOUNTER — Encounter: Payer: Self-pay | Admitting: *Deleted

## 2022-07-21 VITALS — BP 104/58 | HR 100 | Temp 98.2°F | Resp 18 | Ht 66.0 in | Wt 156.2 lb

## 2022-07-21 DIAGNOSIS — C189 Malignant neoplasm of colon, unspecified: Secondary | ICD-10-CM | POA: Insufficient documentation

## 2022-07-21 DIAGNOSIS — C787 Secondary malignant neoplasm of liver and intrahepatic bile duct: Secondary | ICD-10-CM | POA: Diagnosis not present

## 2022-07-21 DIAGNOSIS — K3532 Acute appendicitis with perforation and localized peritonitis, without abscess: Secondary | ICD-10-CM | POA: Diagnosis not present

## 2022-07-21 DIAGNOSIS — C801 Malignant (primary) neoplasm, unspecified: Secondary | ICD-10-CM

## 2022-07-21 DIAGNOSIS — C180 Malignant neoplasm of cecum: Secondary | ICD-10-CM | POA: Diagnosis present

## 2022-07-21 DIAGNOSIS — E7800 Pure hypercholesterolemia, unspecified: Secondary | ICD-10-CM | POA: Diagnosis not present

## 2022-07-21 DIAGNOSIS — C182 Malignant neoplasm of ascending colon: Secondary | ICD-10-CM | POA: Diagnosis not present

## 2022-07-21 DIAGNOSIS — R1010 Upper abdominal pain, unspecified: Secondary | ICD-10-CM | POA: Diagnosis not present

## 2022-07-21 DIAGNOSIS — I10 Essential (primary) hypertension: Secondary | ICD-10-CM | POA: Diagnosis not present

## 2022-07-21 DIAGNOSIS — Z79899 Other long term (current) drug therapy: Secondary | ICD-10-CM | POA: Diagnosis not present

## 2022-07-21 LAB — CBC WITH DIFFERENTIAL (CANCER CENTER ONLY)
Abs Immature Granulocytes: 0.04 10*3/uL (ref 0.00–0.07)
Basophils Absolute: 0 10*3/uL (ref 0.0–0.1)
Basophils Relative: 0 %
Eosinophils Absolute: 0 10*3/uL (ref 0.0–0.5)
Eosinophils Relative: 0 %
HCT: 33.6 % — ABNORMAL LOW (ref 36.0–46.0)
Hemoglobin: 10.6 g/dL — ABNORMAL LOW (ref 12.0–15.0)
Immature Granulocytes: 0 %
Lymphocytes Relative: 10 %
Lymphs Abs: 0.9 10*3/uL (ref 0.7–4.0)
MCH: 25.9 pg — ABNORMAL LOW (ref 26.0–34.0)
MCHC: 31.5 g/dL (ref 30.0–36.0)
MCV: 82.2 fL (ref 80.0–100.0)
Monocytes Absolute: 1 10*3/uL (ref 0.1–1.0)
Monocytes Relative: 11 %
Neutro Abs: 7.1 10*3/uL (ref 1.7–7.7)
Neutrophils Relative %: 79 %
Platelet Count: 302 10*3/uL (ref 150–400)
RBC: 4.09 MIL/uL (ref 3.87–5.11)
RDW: 14 % (ref 11.5–15.5)
WBC Count: 9.1 10*3/uL (ref 4.0–10.5)
nRBC: 0 % (ref 0.0–0.2)

## 2022-07-21 LAB — CMP (CANCER CENTER ONLY)
ALT: 15 U/L (ref 0–44)
AST: 46 U/L — ABNORMAL HIGH (ref 15–41)
Albumin: 4.3 g/dL (ref 3.5–5.0)
Alkaline Phosphatase: 174 U/L — ABNORMAL HIGH (ref 38–126)
Anion gap: 12 (ref 5–15)
BUN: 17 mg/dL (ref 8–23)
CO2: 24 mmol/L (ref 22–32)
Calcium: 9.7 mg/dL (ref 8.9–10.3)
Chloride: 102 mmol/L (ref 98–111)
Creatinine: 0.98 mg/dL (ref 0.44–1.00)
GFR, Estimated: >60 mL/min (ref >60)
Glucose, Bld: 111 mg/dL — ABNORMAL HIGH (ref 70–99)
Potassium: 3.7 mmol/L (ref 3.5–5.1)
Sodium: 138 mmol/L (ref 135–145)
Total Bilirubin: 1.1 mg/dL (ref 0.3–1.2)
Total Protein: 8.2 g/dL — ABNORMAL HIGH (ref 6.5–8.1)

## 2022-07-21 LAB — CEA (ACCESS): CEA (CHCC): 3885.25 ng/mL — ABNORMAL HIGH (ref 0.00–5.00)

## 2022-07-21 MED ORDER — LIDOCAINE-PRILOCAINE 2.5-2.5 % EX CREA
1.0000 | TOPICAL_CREAM | CUTANEOUS | 0 refills | Status: DC | PRN
  Filled 2022-07-21: qty 30, 30d supply, fill #0

## 2022-07-21 MED ORDER — PROCHLORPERAZINE MALEATE 10 MG PO TABS
5.0000 mg | ORAL_TABLET | Freq: Four times a day (QID) | ORAL | 2 refills | Status: DC | PRN
  Filled 2022-07-21: qty 15, 8d supply, fill #0
  Filled 2022-07-28: qty 15, 8d supply, fill #1
  Filled 2022-08-13: qty 15, 8d supply, fill #2

## 2022-07-21 MED ORDER — ONDANSETRON HCL 8 MG PO TABS
8.0000 mg | ORAL_TABLET | Freq: Three times a day (TID) | ORAL | 0 refills | Status: DC | PRN
  Filled 2022-07-21: qty 20, 7d supply, fill #0

## 2022-07-21 MED ORDER — HYDROMORPHONE HCL 2 MG PO TABS
2.0000 mg | ORAL_TABLET | Freq: Four times a day (QID) | ORAL | 0 refills | Status: DC | PRN
  Filled 2022-07-21: qty 30, 7d supply, fill #0

## 2022-07-21 NOTE — Progress Notes (Signed)
PATIENT NAVIGATOR PROGRESS NOTE  Name: Christina [name] Date: 07/21/2022 MRN: [id number]  DOB: Nov 17, 1945   Reason for visit:  F/U appt  Comments:  Met with Christina [name] and her daughter for pre treatment discussion Will have PAC placed on Friday Will have chemo education on Tuesday at 3 pm Will start FOLFOX on Wednesday Discussed and instructed how to take miralax for diarrhea management as well as nausea meds Call with any questions, groceries provided today with appt    Time spent counseling/coordinating care: 45-60 minutes

## 2022-07-21 NOTE — Progress Notes (Signed)
Ms. Wunschel,  As suspected, the large mass found in your colon was colon cancer.  Your cancer started in the colon and then spread to the liver.   You have an appointment today with Dr. Benay Spice to discuss the treatment plan.  I'm so sorry that you are having to deal with this.  I wish you the best of luck with your cancer treatment.

## 2022-07-21 NOTE — Progress Notes (Signed)
START ON PATHWAY REGIMEN - Colorectal     A cycle is every 14 days:     Oxaliplatin      Leucovorin      Fluorouracil      Fluorouracil   **Always confirm dose/schedule in your pharmacy ordering system**  Patient Characteristics: Distant Metastases, Nonsurgical Candidate, KRAS/NRAS Mutation Positive/Unknown (BRAF V600 Wild-Type/Unknown), Standard Cytotoxic Therapy, First Line Standard Cytotoxic Therapy, Bevacizumab Ineligible, PS = 0,1 Tumor Location: Colon Therapeutic Status: Distant Metastases Microsatellite/Mismatch Repair Status: Unknown BRAF Mutation Status: Awaiting Test Results KRAS/NRAS Mutation Status: Awaiting Test Results Preferred Therapy Approach: Standard Cytotoxic Therapy Standard Cytotoxic Line of Therapy: First Line Standard Cytotoxic Therapy ECOG Performance Status: 1 Bevacizumab Eligibility: Ineligible Intent of Therapy: Non-Curative / Palliative Intent, Discussed with Patient

## 2022-07-21 NOTE — Research (Signed)
  This Nurse has reviewed this patient's inclusion and exclusion criteria as a second review and confirms Christina [name] is eligible for study participation.  Patient may continue with enrollment.  Jeral Fruit, RN 07/21/22 4:05 PM

## 2022-07-21 NOTE — Progress Notes (Signed)
Trial Name:  [employer] 2021-05 - Specimen Collection Study to Evaluate Biomarkers in Subjects with Cancer    Patient Christina [name] was identified by Dr. Benay Spice as a potential candidate for the above listed study.  This Clinical Research Nurse met with Christina [name], [id number] on 07/21/22 in a manner and location that ensures patient privacy to discuss participation in the above listed research study.  Patient is Accompanied by her daughter , Christina [name].  Patient was previously provided with informed consent documents.  Patient has not yet read the informed consent documents and so documents were reviewed page by page today.  As outlined in the informed consent form, this Nurse and [employer] discussed the purpose of the research study, the investigational nature of the study, study procedures and requirements for study participation, potential risks and benefits of study participation, as well as alternatives to participation.  This study is not blinded or double-blinded. The patient understands participation is voluntary and they may withdraw from study participation at any time.  This study does not involve randomization.  This study does not involve an investigational drug or device. This study does not involve a placebo. Patient understands enrollment is pending full eligibility review.   Confidentiality and how the patient's information will be used as part of study participation were discussed.  Patient was informed there is reimbursement provided for their time and effort spent on trial participation.  The patient is encouraged to discuss research study participation with their insurance provider to determine what costs they may incur as part of study participation, including research related injury.    All questions were answered to patient's satisfaction.  The informed consent with embedded HIPAA language was reviewed page by page.  The patient's mental and emotional status is  appropriate to provide informed consent, and the patient verbalizes an understanding of study participation.  Patient has agreed to participate in the above listed research study. Patient requested that her daughter sign consent for her and gave her verbal consent, voluntarily, in my presence, that she wanted her daughter to sign, due to patient having abdominal pain. Daughter, Christina [name], signed the informed consent, with patient's name with patient present, version 05 Sep 2020 with embedded HIPAA language, version 05 Sep 2020  on 07/21/22 at 1:30PM.  The patient requested to be provided with a copy of the signed informed consent form with embedded HIPAA language for their reference, when patient returns for the specimen collection, which will be just before start of treatment.  No study specific procedures were obtained prior to the signing of the informed consent document.  Approximately 20 minutes were spent with the patient and daughter reviewing the informed consent documents.  After obtaining informed consent patient, voluntarily signed the optional Release of Information form for use throughout trial participation.    Patient's history was reviewed after signing of consent form.   Medical History:  High Blood Pressure  Yes Coronary Artery Disease No Lupus    No Rheumatoid Arthritis  Yes Diabetes   No      Lynch Syndrome  No  Is the patient currently taking a magnesium supplement?   Patient confirms that she is not taking magnesium.  Does the patient have a personal history of cancer (greater than 5 years ago)?  Yes If yes, Cancer type and date of diagnosis?   Breast cancer  Has this previous diagnosis been treated? yes      If so, treatment type? Radiation treatment   Start and  end dates of last treatment cycle? 11/04/2021 - 12/16/2015  Does the patient have a family history of cancer in 1st or 2nd degree relatives? Yes If yes, Relationship(s) and Cancer type(s)? Mother had uterine  cancer, brother had lung cancer.  Does the patient have history of alcohol consumption? No    Does the patient have history of cigarette, cigar, pipe, or chewing tobacco use?  No    This Nurse has reviewed this patient's inclusion and exclusion criteria and confirmed Christina [name] is eligible for study participation.  Patient will continue with enrollment. Eligibility confirmed by treating investigator, who also agrees that patient should proceed with enrollment.   Maxwell Marion, RN, BSN, [place] Clinical Research Nurse Lead 07/21/2022 3:08 PM

## 2022-07-21 NOTE — Progress Notes (Signed)
[facility] OFFICE PROGRESS NOTE   Diagnosis: Colon cancer  INTERVAL HISTORY:   Ms. Stallworth underwent a colonoscopy on 07/19/2022.  A mass was found at the cecum and a biopsy confirmed invasive adenocarcinoma. Ms. Masse is here with her daughter.  She reports increased right abdominal pain.  She had 1 episode of small-volume emesis.  She is having bowel movements.  Tylenol does not relieve the pain.  She reports pruritus with hydrocodone and oxycodone.  Objective:  Vital signs in last 24 hours:  Blood pressure (!) 104/58, pulse 100, temperature 98.2 F (36.8 C), temperature source Oral, resp. rate 18, height _0  (1.676 m), weight 156 lb 3.2 oz (70.9 kg), SpO2 100 %.   Resp: Lungs clear bilaterally Cardio: Regular rate and rhythm GI: No hepatosplenomegaly, no mass, nontender Vascular: No leg edema   Lab Results:  Lab Results  Component Value Date   WBC 4.6 07/07/2022   HGB 9.9 (L) 07/07/2022   HCT 32.3 (L) 07/07/2022   MCV 85.2 07/07/2022   PLT 505 (H) 07/07/2022   NEUTROABS 2.2 07/07/2022    CMP  Lab Results  Component Value Date   NA 137 07/07/2022   K 4.0 07/07/2022   CL 103 07/07/2022   CO2 26 07/07/2022   GLUCOSE 124 (H) 07/07/2022   BUN 13 07/07/2022   CREATININE 0.89 07/07/2022   CALCIUM 9.2 07/07/2022   PROT 8.3 (H) 07/07/2022   ALBUMIN 3.8 07/07/2022   AST 26 07/07/2022   ALT 11 07/07/2022   ALKPHOS 143 (H) 07/07/2022   BILITOT 0.6 07/07/2022   GFRNONAA >60 07/07/2022   GFRAA >60 10/17/2019    No results found for: "CEA1", "CEA", "XNT700", "CA125"   Medications: I have reviewed the patient's current medications.   Assessment/Plan: Metastatic colon cancer CT abdomen/pelvis 06/22/2022, interval appendectomy, extensive interstitial and extraluminal gas surrounding the surgical staples at the resection margin, extensive cecal wall and terminal ileum thickening, pathologic adenopathy in the ileocolic lymph nodes, interval  development of by lobar hypoenhancing hepatic masses Ultrasound-guided biopsy of left liver mass 06/28/2022-static adenocarcinoma, CDX2 and CK20 positive Colonoscopy 07/19/2022-malignant tumor in the cecum-moderately differentiated adenocarcinoma with mucinous features and background of high-grade dysplasia, ileocecal valve polyp-negative for malignancy Low-grade appendiceal mucinous neoplasm 03/03/2022 Appendectomy with neoplastic changes in the epithelium at the appendiceal margin of resection, gangrenous appendicitis, no mucinous deposits on appendiceal serosa, no neoplastic infiltration of the submucosa and muscularis 3.  Right breast cancer 08/28/2015-pT2pN0, stage IIa invasive ductal carcinoma, grade 1, HER2 negative, ER positive, PR positive-adjuvant radiation and anastrozole 12/29/2015-May 2022 4.  Left breast cancer 08/28/2015,pT1cpN0, stage Ia invasive ductal carcinoma, grade 1, HER2 negative, ER positive, PR positive-adjuvant radiation, anastrozole 12/29/2015-May 2022  5.  Hypertension  6.  Upper abdominal pain-likely secondary to metastatic disease involving the liver and the cecal tumor     Disposition: Ms. Loberg has been diagnosed with metastatic colon cancer.  I discussed the prognosis and treatment options with Ms. Seelman and her daughter.  She understands no therapy will be curative.  The goal of systemic therapy is to palliate her symptoms and prolong survival.  She is scheduled for Port-A-Cath placement on 07/23/2022.  The liver biopsy tissue has been submitted for molecular testing.  These results are pending.  I recommend first-line treatment with FOLFOX.  We reviewed potential toxicities associated with the FOLFOX regimen including the chance of nausea/vomiting, mucositis, alopecia, diarrhea, hematologic toxicity, infection, and bleeding.  We discussed the rash, sun sensitivity, hyperpigmentation, and hand/foot  syndrome seen with 5-fluorouracil.  We discussed the allergic reaction  and various types of neuropathy associated with oxaliplatin.  She agrees to proceed.  Ms. Gade will attend a chemotherapy teaching class.  She will be scheduled for cycle 1 FOLFOX on 07/28/2022.  A chemotherapy plan was entered today.  She will attend a chemotherapy teaching class.  She will begin a trial of hydromorphone to use as needed for pain.  She will contact us if this does not relieve the pain.  She will begin a bowel regimen.  She will return to the lab for for a baseline CBC, chemistry panel, and CEA.  [name], MD  07/21/2022  12:48 PM

## 2022-07-22 ENCOUNTER — Other Ambulatory Visit: Payer: Self-pay | Admitting: Radiology

## 2022-07-22 ENCOUNTER — Encounter (HOSPITAL_COMMUNITY): Payer: Self-pay | Admitting: Oncology

## 2022-07-22 ENCOUNTER — Encounter: Payer: Self-pay | Admitting: Oncology

## 2022-07-22 ENCOUNTER — Other Ambulatory Visit: Payer: Self-pay

## 2022-07-22 ENCOUNTER — Other Ambulatory Visit: Payer: Self-pay | Admitting: Student

## 2022-07-23 ENCOUNTER — Ambulatory Visit (HOSPITAL_COMMUNITY)
Admission: RE | Admit: 2022-07-23 | Discharge: 2022-07-23 | Disposition: A | Discharge status: Home / Self Care | Payer: Medicare Other | Source: Ambulatory Visit | Attending: Oncology | Admitting: Oncology

## 2022-07-23 ENCOUNTER — Encounter (HOSPITAL_COMMUNITY): Payer: Self-pay

## 2022-07-23 ENCOUNTER — Other Ambulatory Visit: Payer: Self-pay

## 2022-07-23 DIAGNOSIS — Z853 Personal history of malignant neoplasm of breast: Secondary | ICD-10-CM | POA: Insufficient documentation

## 2022-07-23 DIAGNOSIS — C189 Malignant neoplasm of colon, unspecified: Secondary | ICD-10-CM | POA: Insufficient documentation

## 2022-07-23 DIAGNOSIS — E785 Hyperlipidemia, unspecified: Secondary | ICD-10-CM | POA: Insufficient documentation

## 2022-07-23 DIAGNOSIS — I10 Essential (primary) hypertension: Secondary | ICD-10-CM | POA: Insufficient documentation

## 2022-07-23 DIAGNOSIS — C801 Malignant (primary) neoplasm, unspecified: Secondary | ICD-10-CM

## 2022-07-23 HISTORY — PX: IR IMAGING GUIDED PORT INSERTION: IMG5740

## 2022-07-23 MED ORDER — MIDAZOLAM HCL 2 MG/2ML IJ SOLN
INTRAMUSCULAR | Status: AC | PRN
  Administered 2022-07-23: .5 mg via INTRAVENOUS
  Administered 2022-07-23: 1 mg via INTRAVENOUS
  Administered 2022-07-23: .5 mg via INTRAVENOUS

## 2022-07-23 MED ORDER — FENTANYL CITRATE (PF) 100 MCG/2ML IJ SOLN
INTRAMUSCULAR | Status: AC | PRN
  Administered 2022-07-23: 50 ug via INTRAVENOUS
  Administered 2022-07-23 (×2): 25 ug via INTRAVENOUS

## 2022-07-23 MED ORDER — FENTANYL CITRATE (PF) 100 MCG/2ML IJ SOLN
INTRAMUSCULAR | Status: AC
  Filled 2022-07-23: qty 2

## 2022-07-23 MED ORDER — SODIUM CHLORIDE 0.9 % IV SOLN: INTRAVENOUS | Status: DC

## 2022-07-23 MED ORDER — HEPARIN SOD (PORK) LOCK FLUSH 100 UNIT/ML IV SOLN
INTRAVENOUS | Status: AC
  Filled 2022-07-23: qty 5

## 2022-07-23 MED ORDER — LIDOCAINE-EPINEPHRINE 1 %-1:100000 IJ SOLN
INTRAMUSCULAR | Status: AC
  Filled 2022-07-23: qty 1

## 2022-07-23 MED ORDER — MIDAZOLAM HCL 2 MG/2ML IJ SOLN
INTRAMUSCULAR | Status: AC
  Filled 2022-07-23: qty 2

## 2022-07-23 NOTE — Procedures (Signed)
Interventional Radiology Procedure Note ° °Procedure: Single Lumen Power Port Placement   ° °Access:  Right internal jugular vein ° °Findings: Catheter tip positioned at cavoatrial junction. Port is ready for immediate use.  ° °Complications: None ° °EBL: < 10 mL ° °Recommendations:  °- Ok to shower in 24 hours °- Do not submerge for 7 days °- Routine line care  ° ° °Olesya Wike, MD ° ° ° °

## 2022-07-23 NOTE — H&P (Signed)
Chief Complaint: Colon Cancer  Referring Physician(s): Sherrill  Supervising Physician: Ruthann Cancer  Patient Status: [facility] - Out-pt  History of Present Illness: Christina [name] is a 76 y.o. female who is known to our service.  She has medical issues including HTN, HLD, and a history of breast cancer.  She underwent a laparoscopic appendectomy back on 03/03/22 and the pathology showed mucinous neoplasm with focal high-grade dysplasia.   She was recommended to undergo a colonoscopy at that time but had not schedule it.  She developed abrupt onset nausea and vomiting, prompting an ED visit October 31.    CT scan at that time showed numerous hepatic hypodensities suggestive of metastatic disease, as well as extra luminal gas around the cecum with wall thickening as well as pathologic ileocolic lymph nodes.    Ultrasound-guided biopsy of one of the hepatic hypodensities which was done by Dr. Dwaine Gale confirmed adenocarcinoma.  She is here today for placement of a tunneled catheter with port.  She is NPO. No nausea/vomiting. No Fever/chills. ROS negative.   Past Medical History:  Diagnosis Date   Anemia    Breast cancer ([place]) 08/28/2015   Bilateral   Breast cancer of upper-outer quadrant of right female breast ([place]) 06/23/2015   High cholesterol    Hx of radiation therapy 11/05/15- 12/16/15   Right Breast and Left Breast   Hypertension    Personal history of radiation therapy    Right Breast Cancer   Vitamin D deficiency 06/29/2018    Past Surgical History:  Procedure Laterality Date   BIOPSY  07/19/2022   Procedure: BIOPSY;  Surgeon: Daryel November, MD;  Location: [facility] ENDOSCOPY;  Service: Gastroenterology;;   BREAST LUMPECTOMY Left 08/28/2015   BREAST LUMPECTOMY Right 08/28/2015   BREAST LUMPECTOMY WITH NEEDLE LOCALIZATION AND AXILLARY SENTINEL LYMPH NODE BX Bilateral 08/28/2015   Procedure: BILATERAL BREAST LUMPECTOMY WITH BILATERAL  NEEDLE LOCALIZATION AND BILATERAL  AXILLARY SENTINEL LYMPH NODE BX;  Surgeon: Erroll Luna, MD;  Location: [facility];  Service: General;  Laterality: Bilateral;   COLONOSCOPY WITH PROPOFOL N/A 07/19/2022   Procedure: COLONOSCOPY WITH PROPOFOL;  Surgeon: Daryel November, MD;  Location: [facility] ENDOSCOPY;  Service: Gastroenterology;  Laterality: N/A;   IR US GUIDE BX ASP/DRAIN  06/28/2022   LAPAROSCOPIC APPENDECTOMY N/A 03/03/2022   Procedure: APPENDECTOMY LAPAROSCOPIC;  Surgeon: Erroll Luna, MD;  Location: [facility] ORS;  Service: General;  Laterality: N/A;    Allergies: Onion, Simvastatin, Aspirin, and Hydrocodone  Medications: Prior to Admission medications   Medication Sig Start Date End Date Taking? Authorizing Provider  acetaminophen (TYLENOL) 500 MG tablet Take 500 mg by mouth every 6 (six) hours as needed for fever.    [provider]  amLODipine (NORVASC) 10 MG tablet Take 1 tablet (10 mg total) by mouth daily. - Dose change. 06/04/22     HYDROmorphone (DILAUDID) 2 MG tablet Take 1 tablet (2 mg total) by mouth every 6 (six) hours as needed for severe pain or moderate pain. 07/21/22   Ladell Pier, MD  lidocaine-prilocaine (EMLA) cream Apply 1 Application topically as needed. 07/21/22   Ladell Pier, MD  olmesartan (BENICAR) 20 MG tablet Take 1 tablet (20 mg total) by mouth daily. 08/21/21     olopatadine (PATADAY) 0.1 % ophthalmic solution Place 1 drop into both eyes 2 times daily. Patient not taking: Reported on 06/22/2022 12/02/21   Raspet, Erin K, PA-C  ondansetron (ZOFRAN) 8 MG tablet Take 1 tablet (8 mg total) by mouth  every 8 (eight) hours as needed for nausea or vomiting. 07/21/22   Ladell Pier, MD  prochlorperazine (COMPAZINE) 10 MG tablet Take 0.5 tablets (5 mg total) by mouth every 6 (six) hours as needed for nausea or vomiting. 07/21/22   Ladell Pier, MD  losartan-hydrochlorothiazide (HYZAAR) 100-25 MG tablet Take 1 tablet by mouth daily. 12/31/20 06/03/21       Family History  Problem  Relation Age of Onset   Cancer Mother        Uterine   Diabetes Sister    Lung cancer Brother    Diabetes Brother    Cancer - Colon Neg Hx    Esophageal cancer Neg Hx    Liver disease Neg Hx     Social History   Socioeconomic History   Marital status: Divorced    Spouse name: Not on file   Number of children: 2   Years of education: Not on file   Highest education level: Not on file  Occupational History   Occupation: reired  Tobacco Use   Smoking status: Never   Smokeless tobacco: Never  Vaping Use   Vaping Use: Never used  Substance and Sexual Activity   Alcohol use: No   Drug use: No   Sexual activity: Never  Other Topics Concern   Not on file  Social History Narrative   2 children   Social Determinants of Health   Financial Resource Strain: Medium Risk (07/12/2022)   Overall Financial Resource Strain (CARDIA)    Difficulty of Paying Living Expenses: Somewhat hard  Food Insecurity: No Food Insecurity (06/24/2022)   Hunger Vital Sign    Worried About Running Out of Food in the Last Year: Never true    Ran Out of Food in the Last Year: Never true  Recent Concern: Food Insecurity - Food Insecurity Present (06/23/2022)   Hunger Vital Sign    Worried About Running Out of Food in the Last Year: Sometimes true    Ran Out of Food in the Last Year: Sometimes true  Transportation Needs: No Transportation Needs (06/23/2022)   PRAPARE - Hydrologist (Medical): No    Lack of Transportation (Non-Medical): No  Physical Activity: Not on file  Stress: Not on file  Social Connections: Not on file     Review of Systems: A 12 point ROS discussed and pertinent positives are indicated in the HPI above.  All other systems are negative.  Review of Systems  Vital Signs: BP 108/62   Pulse 90   Temp (!) 97.5 F (36.4 C) (Temporal)   Resp 16   Ht '5\' 6"'$  (1.676 m)   Wt 153 lb (69.4 kg)   SpO2 99%   BMI 24.69 kg/m   Physical Exam Vitals  reviewed.  Constitutional:      Appearance: Normal appearance.  HENT:     Head: Normocephalic and atraumatic.  Eyes:     Extraocular Movements: Extraocular movements intact.  Cardiovascular:     Rate and Rhythm: Normal rate and regular rhythm.  Pulmonary:     Effort: Pulmonary effort is normal. No respiratory distress.     Breath sounds: Normal breath sounds.  Abdominal:     Palpations: Abdomen is soft.     Comments: Tiny area of hypergranulation. Silver nitrate applied.  Musculoskeletal:        General: Normal range of motion.     Cervical back: Normal range of motion.  Skin:    General: Skin  is warm and dry.  Neurological:     General: No focal deficit present.     Mental Status: She is alert and oriented to person, place, and time.  Psychiatric:        Mood and Affect: Mood normal.        Behavior: Behavior normal.        Thought Content: Thought content normal.        Judgment: Judgment normal.     Imaging: DG Abdomen Acute W/Chest  Result Date: 07/07/2022 CLINICAL DATA:  Right-sided abdominal pain under rib cage. EXAM: DG ABDOMEN ACUTE WITH 1 VIEW CHEST COMPARISON:  Chest radiograph 03/02/2022, CT abdomen/pelvis 06/25/2022 FINDINGS: Chest: The cardiomediastinal silhouette is normal. There is new patchy airspace opacity in the left lower lobe which is less conspicuous on a subsequent abdominal radiograph. There is no other focal airspace opacity. There is no pulmonary edema. There is no pleural effusion or pneumothorax. Abdomen: There is a nonobstructive bowel gas pattern. There is no free intraperitoneal air. There is no abnormal soft tissue calcification. There is no acute osseous abnormality. There is multilevel degenerative change of the spine. Bilateral breast clips are noted. IMPRESSION: 1. New airspace opacity in the left lower lobe favored to reflect atelectasis, though pneumonia is not excluded in the correct clinical setting. 2. Nonobstructive bowel gas pattern.  No  free intraperitoneal air. Electronically Signed   By: Valetta Mole M.D.   On: 07/07/2022 11:15   IR US Guide Bx Asp/Drain  Result Date: 06/28/2022 INDICATION: 76 year old woman with history of appendiceal mucinous neoplasm presents to IR for biopsy of multifocal liver lesions. EXAM: Ultrasound-guided biopsy of left liver mass MEDICATIONS: None. ANESTHESIA/SEDATION: Moderate (conscious) sedation was employed during this procedure. A total of Versed 1.5 mg and Fentanyl 75 mcg was administered intravenously by the radiology nurse. Total intra-service moderate Sedation Time: 15 minutes. The patient's level of consciousness and vital signs were monitored continuously by radiology nursing throughout the procedure under my direct supervision. COMPLICATIONS: None immediate. PROCEDURE: Informed written consent was obtained from the patient after a thorough discussion of the procedural risks, benefits and alternatives. All questions were addressed. Maximal Sterile Barrier Technique was utilized including caps, mask, sterile gowns, sterile gloves, sterile drape, hand hygiene and skin antiseptic. A timeout was performed prior to the initiation of the procedure. Patient position supine on the ultrasound table. Epigastric skin prepped and draped in usual sterile fashion. Following local lidocaine administration, 17 gauge introducer needle was advanced into the left hepatic lobe mass, and three 18 gauge cores were obtained utilizing continuous ultrasound guidance. Gelfoam slurry was administered through the introducer needle at the biopsy site. Samples were sent to pathology in formalin. Needle removed and hemostasis achieved with 5 minutes of manual compression. Post procedure ultrasound images showed no evidence of significant hemorrhage. IMPRESSION: Ultrasound-guided biopsy of left liver mass. Electronically Signed   By: Miachel Roux M.D.   On: 06/28/2022 12:17   CT ABD PELVIS W/WO CM ONCOLOGY LIVER PROTOCOL  Result  Date: 06/25/2022 CLINICAL DATA:  Appendiceal mucinous neoplasm. Recent appendectomy. Follow-up metastatic disease and postop inflammatory disease. * Tracking Code: BO * EXAM: CT ABDOMEN AND PELVIS WITHOUT AND WITH CONTRAST TECHNIQUE: Multidetector CT imaging of the abdomen and pelvis was performed following the standard protocol before and following the bolus administration of intravenous contrast. RADIATION DOSE REDUCTION: This exam was performed according to the departmental dose-optimization program which includes automated exposure control, adjustment of the mA and/or kV according to patient size  and/or use of iterative reconstruction technique. CONTRAST:  189m OMNIPAQUE IOHEXOL 300 MG/ML  SOLN COMPARISON:  06/22/2022 FINDINGS: Lower Chest: No acute findings. Hepatobiliary: Multiple hypovascular metastases are again seen throughout the liver, without significant change compared to recent exam. Gallbladder is unremarkable. No evidence of biliary ductal dilatation. Pancreas:  No mass or inflammatory changes. Spleen: Within normal limits in size and appearance. Adrenals/Urinary Tract: No suspicious masses identified. No evidence of ureteral calculi or hydronephrosis. Stomach/Bowel: Diverticulosis is seen mainly involving the descending and sigmoid colon, however there is no evidence of diverticulitis. Mild diffuse wall thickening throughout the rectum and sigmoid colon is suspicious for proctocolitis. Postop changes are again seen from appendectomy. A focal 2.4 cm gas collection adjacent to surgical staples and base of cecum remains stable. No evidence of extravasation of oral contrast or free intraperitoneal air. No evidence of bowel obstruction. Nodular wall thickening is again seen involving the base of the cecum and terminal ileum, which is suspicious for tumor. Vascular/Lymphatic: Lymphadenopathy is again seen in the right abdominal mesentery, with largest lymph node measuring 2.1 cm on image 92/11, compared  to 1.5 cm previously. This is highly suspicious for metastatic disease. No acute vascular findings. Reproductive: Prior hysterectomy noted. Adnexal regions are unremarkable in appearance. Other:  None. Musculoskeletal:  No suspicious bone lesions identified. IMPRESSION: Postop changes from appendectomy, with stable 2.4 cm gas collection adjacent to surgical staples and base of cecum. No evidence of contrast leak or free intraperitoneal air. Persistent nodular wall thickening involving the base of the cecum and terminal ileum, highly suspicious for tumor. Mild increase in right lower quadrant mesenteric lymphadenopathy, highly suspicious for metastatic disease. Diffuse hepatic metastatic disease, without significant change. Mild diffuse wall thickening throughout the rectum and sigmoid colon, suspicious for proctocolitis. Colonic diverticulosis, without radiographic evidence of diverticulitis. Electronically Signed   By: JMarlaine HindM.D.   On: 06/25/2022 15:47    Labs:  CBC: Recent Labs    06/28/22 0440 06/29/22 0521 07/07/22 1538 07/21/22 1324  WBC 10.0 10.3 4.6 9.1  HGB 8.3* 8.3* 9.9* 10.6*  HCT 25.9* 26.0* 32.3* 33.6*  PLT 275 309 505* 302    COAGS: Recent Labs    03/02/22 2119 06/28/22 0440  INR 1.1 1.1  APTT 29  --     BMP: Recent Labs    06/29/22 0521 06/30/22 0528 07/07/22 1538 07/21/22 1324  NA 134* 138 137 138  K 3.3* 4.1 4.0 3.7  CL 106 106 103 102  CO2 '23 24 26 24  '$ GLUCOSE 108* 103* 124* 111*  BUN '10 11 13 17  '$ CALCIUM 8.2* 8.9 9.2 9.7  CREATININE 0.69 0.68 0.89 0.98  GFRNONAA >60 >60 >60 >60    LIVER FUNCTION TESTS: Recent Labs    03/06/22 0541 06/22/22 1742 07/07/22 1538 07/21/22 1324  BILITOT 0.8 1.1 0.6 1.1  AST '23 26 26 '$ 46*  ALT '20 16 11 15  '$ ALKPHOS 116 154* 143* 174*  PROT 5.7* 8.2* 8.3* 8.2*  ALBUMIN 2.7* 4.1 3.8 4.3    TUMOR MARKERS: Recent Labs    07/21/22 1324  CEA 33,149.70    Assessment and Plan:  Metastatic  adenocarcinoma  Will proceed with image guided placement of a tunneled catheter with port today by Dr. SSerafina Royals  Risks and benefits of image guided port-a-catheter placement was discussed with the patient including, but not limited to bleeding, infection, pneumothorax, or fibrin sheath development and need for additional procedures.  All of the patient's questions were answered, patient is  agreeable to proceed. Consent signed and in chart.  Thank you for allowing our service to participate in Branch 's care.  Electronically Signed: Murrell Redden, PA-C   07/23/2022, 10:10 AM      I spent a total of    25 Minutes in face to face in clinical consultation, greater than 50% of which was counseling/coordinating care for [facility] placement.

## 2022-07-25 ENCOUNTER — Other Ambulatory Visit: Payer: Self-pay | Admitting: Oncology

## 2022-07-26 ENCOUNTER — Telehealth: Payer: Self-pay

## 2022-07-26 NOTE — Telephone Encounter (Signed)
CSW attempted to contact patient to follow up after initial assessment.  Left vm.

## 2022-07-27 ENCOUNTER — Inpatient Hospital Stay: Payer: Medicare Other | Admitting: Licensed Clinical Social Worker

## 2022-07-27 ENCOUNTER — Telehealth: Payer: Self-pay | Admitting: Medical Oncology

## 2022-07-27 ENCOUNTER — Inpatient Hospital Stay: Payer: Medicare Other | Attending: Oncology

## 2022-07-27 DIAGNOSIS — K3580 Unspecified acute appendicitis: Secondary | ICD-10-CM | POA: Insufficient documentation

## 2022-07-27 DIAGNOSIS — Z853 Personal history of malignant neoplasm of breast: Secondary | ICD-10-CM | POA: Insufficient documentation

## 2022-07-27 DIAGNOSIS — Z5189 Encounter for other specified aftercare: Secondary | ICD-10-CM | POA: Insufficient documentation

## 2022-07-27 DIAGNOSIS — Z5111 Encounter for antineoplastic chemotherapy: Secondary | ICD-10-CM | POA: Insufficient documentation

## 2022-07-27 DIAGNOSIS — Z79899 Other long term (current) drug therapy: Secondary | ICD-10-CM | POA: Insufficient documentation

## 2022-07-27 DIAGNOSIS — C180 Malignant neoplasm of cecum: Secondary | ICD-10-CM | POA: Insufficient documentation

## 2022-07-27 DIAGNOSIS — R590 Localized enlarged lymph nodes: Secondary | ICD-10-CM | POA: Insufficient documentation

## 2022-07-27 DIAGNOSIS — R220 Localized swelling, mass and lump, head: Secondary | ICD-10-CM | POA: Insufficient documentation

## 2022-07-27 DIAGNOSIS — I10 Essential (primary) hypertension: Secondary | ICD-10-CM | POA: Insufficient documentation

## 2022-07-27 DIAGNOSIS — C787 Secondary malignant neoplasm of liver and intrahepatic bile duct: Secondary | ICD-10-CM | POA: Insufficient documentation

## 2022-07-27 NOTE — Telephone Encounter (Signed)
[employer] 2021-05 - Specimen Collection Study to Evaluate Biomarkers in Subjects with Cancer    Outgoing call: [phone]  LVMOM with patient's daughter informing her that research nurse, Threasa Beards will be at the clinic tomorrow to collect patient's blood specimen for study and provide gift card. Patient thanked and encouraged to call with questions.   Maxwell Marion, RN, BSN, [place] Clinical Research Nurse Lead 07/27/2022 3:47 PM

## 2022-07-27 NOTE — Progress Notes (Signed)
[place] CSW Progress Note  Holiday representative met with caregiver to psychosocial needs.  Christina [name], patient's daughter, met with the nurse navigator for chemo education for patient prior to [place] visit.  Provided education regarding required information for the [employer].  She said she would bring in her mother's proof of income tomorrow.  Will forward to Otilio Carpen, Development worker, community.  Christina [name] is very involved with her mother's care.  She is the only member working in the family currently and feels stress.  Will continue to explore available financial resources.  Provided active listening and supportive counseling.    Rodman Pickle Yoseline Andersson, LCSW

## 2022-07-28 ENCOUNTER — Inpatient Hospital Stay (HOSPITAL_BASED_OUTPATIENT_CLINIC_OR_DEPARTMENT_OTHER): Payer: Medicare Other | Admitting: Oncology

## 2022-07-28 ENCOUNTER — Other Ambulatory Visit (HOSPITAL_BASED_OUTPATIENT_CLINIC_OR_DEPARTMENT_OTHER): Payer: Self-pay

## 2022-07-28 ENCOUNTER — Encounter: Payer: Self-pay | Admitting: Oncology

## 2022-07-28 ENCOUNTER — Inpatient Hospital Stay: Payer: Medicare Other

## 2022-07-28 VITALS — BP 118/68 | HR 98 | Temp 97.9°F | Resp 18 | Wt 155.3 lb

## 2022-07-28 VITALS — BP 120/71 | HR 81

## 2022-07-28 DIAGNOSIS — C182 Malignant neoplasm of ascending colon: Secondary | ICD-10-CM | POA: Diagnosis not present

## 2022-07-28 DIAGNOSIS — C180 Malignant neoplasm of cecum: Secondary | ICD-10-CM | POA: Diagnosis present

## 2022-07-28 DIAGNOSIS — Z5189 Encounter for other specified aftercare: Secondary | ICD-10-CM | POA: Diagnosis not present

## 2022-07-28 DIAGNOSIS — Z5111 Encounter for antineoplastic chemotherapy: Secondary | ICD-10-CM | POA: Diagnosis not present

## 2022-07-28 DIAGNOSIS — K3580 Unspecified acute appendicitis: Secondary | ICD-10-CM | POA: Diagnosis not present

## 2022-07-28 DIAGNOSIS — Z79899 Other long term (current) drug therapy: Secondary | ICD-10-CM | POA: Diagnosis not present

## 2022-07-28 DIAGNOSIS — R590 Localized enlarged lymph nodes: Secondary | ICD-10-CM | POA: Diagnosis not present

## 2022-07-28 DIAGNOSIS — C787 Secondary malignant neoplasm of liver and intrahepatic bile duct: Secondary | ICD-10-CM | POA: Diagnosis not present

## 2022-07-28 DIAGNOSIS — I10 Essential (primary) hypertension: Secondary | ICD-10-CM | POA: Diagnosis not present

## 2022-07-28 DIAGNOSIS — Z853 Personal history of malignant neoplasm of breast: Secondary | ICD-10-CM | POA: Diagnosis not present

## 2022-07-28 DIAGNOSIS — R220 Localized swelling, mass and lump, head: Secondary | ICD-10-CM | POA: Diagnosis not present

## 2022-07-28 MED ORDER — OXALIPLATIN CHEMO INJECTION 100 MG/20ML
83.0000 mg/m2 | Freq: Once | INTRAVENOUS | Status: AC
  Administered 2022-07-28: 150 mg via INTRAVENOUS
  Filled 2022-07-28: qty 20

## 2022-07-28 MED ORDER — DEXTROSE 5 % IV SOLN: Freq: Once | INTRAVENOUS | Status: AC

## 2022-07-28 MED ORDER — FLUOROURACIL CHEMO INJECTION 2.5 GM/50ML
400.0000 mg/m2 | Freq: Once | INTRAVENOUS | Status: AC
  Administered 2022-07-28: 750 mg via INTRAVENOUS
  Filled 2022-07-28: qty 15

## 2022-07-28 MED ORDER — PROCHLORPERAZINE MALEATE 10 MG PO TABS
10.0000 mg | ORAL_TABLET | Freq: Once | ORAL | Status: AC
  Administered 2022-07-28: 10 mg via ORAL
  Filled 2022-07-28: qty 1

## 2022-07-28 MED ORDER — SODIUM CHLORIDE 0.9 % IV SOLN
10.0000 mg | Freq: Once | INTRAVENOUS | Status: AC
  Administered 2022-07-28: 10 mg via INTRAVENOUS
  Filled 2022-07-28: qty 10

## 2022-07-28 MED ORDER — SODIUM CHLORIDE 0.9 % IV SOLN
2400.0000 mg/m2 | INTRAVENOUS | Status: DC
  Administered 2022-07-28: 4350 mg via INTRAVENOUS
  Filled 2022-07-28: qty 87

## 2022-07-28 MED ORDER — SODIUM CHLORIDE 0.9 % IV SOLN
150.0000 mg | Freq: Once | INTRAVENOUS | Status: AC
  Administered 2022-07-28: 150 mg via INTRAVENOUS
  Filled 2022-07-28: qty 150

## 2022-07-28 MED ORDER — LEUCOVORIN CALCIUM INJECTION 350 MG
400.0000 mg/m2 | Freq: Once | INTRAVENOUS | Status: AC
  Administered 2022-07-28: 728 mg via INTRAVENOUS
  Filled 2022-07-28: qty 36.4

## 2022-07-28 NOTE — Research (Signed)
[employer] 2021-05 - Specimen Collection Study to Evaluate Biomarkers in Subjects with Cancer   Blood collected for ES from port after it was accessed. Blood was brought back to [facility] for processing. Gift card given to patient; after getting verbal OK from patient, daughter signed for [place], as patient was covered by sterile drapes during port access.  Marjie Skiff Angelis Gates, RN, BSN, [facility]  [facility]  [place] Clinical Research Nurse [facility] Direct Dial [phone]  Pager [phone] 07/28/2022 4:22 PM

## 2022-07-28 NOTE — Progress Notes (Signed)
  [place] OFFICE PROGRESS NOTE   Diagnosis: Colon cancer  INTERVAL HISTORY:   Christina [name] returns as scheduled.  She underwent Port-A-Cath placement on 07/23/2022.  She reports abdominal pain has improved.  She developed hives and lip swelling when she took Zofran for nausea.  She last had a bowel movement when she took Dulcolax.  No nausea or vomiting.  Objective:  Vital signs in last 24 hours:  Blood pressure 118/68, pulse 98, temperature 97.9 F (36.6 C), temperature source Tympanic, resp. rate 18, weight 155 lb 4.8 oz (70.4 kg), SpO2 100 %.    Resp: Lungs clear bilaterally Cardio: Good rate and rhythm GI: Mild tenderness in the right upper abdomen, no hepatosplenomegaly, no mass Vascular: No leg edema    Portacath/PICC-without erythema  Lab Results:  Lab Results  Component Value Date   WBC 9.1 07/21/2022   HGB 10.6 (L) 07/21/2022   HCT 33.6 (L) 07/21/2022   MCV 82.2 07/21/2022   PLT 302 07/21/2022   NEUTROABS 7.1 07/21/2022    CMP  Lab Results  Component Value Date   NA 138 07/21/2022   K 3.7 07/21/2022   CL 102 07/21/2022   CO2 24 07/21/2022   GLUCOSE 111 (H) 07/21/2022   BUN 17 07/21/2022   CREATININE 0.98 07/21/2022   CALCIUM 9.7 07/21/2022   PROT 8.2 (H) 07/21/2022   ALBUMIN 4.3 07/21/2022   AST 46 (H) 07/21/2022   ALT 15 07/21/2022   ALKPHOS 174 (H) 07/21/2022   BILITOT 1.1 07/21/2022   GFRNONAA >60 07/21/2022   GFRAA >60 10/17/2019    Lab Results  Component Value Date   CEA 3,885.25 (H) 07/21/2022    Lab Results  Component Value Date   INR 1.1 06/28/2022   LABPROT 14.3 06/28/2022     Medications: I have reviewed the patient's current medications.   Assessment/Plan: Metastatic colon cancer CT abdomen/pelvis 06/22/2022, interval appendectomy, extensive interstitial and extraluminal gas surrounding the surgical staples at the resection margin, extensive cecal wall and terminal ileum thickening, pathologic adenopathy  in the ileocolic lymph nodes, interval development of by lobar hypoenhancing hepatic masses Ultrasound-guided biopsy of left liver mass 06/28/2022-static adenocarcinoma, CDX2 and CK20 positive Colonoscopy 07/19/2022-malignant tumor in the cecum-moderately differentiated adenocarcinoma with mucinous features and background of high-grade dysplasia, ileocecal valve polyp-negative for malignancy Cycle 1 FOLFOX 07/28/2022 Low-grade appendiceal mucinous neoplasm 03/03/2022 Appendectomy with neoplastic changes in the epithelium at the appendiceal margin of resection, gangrenous appendicitis, no mucinous deposits on appendiceal serosa, no neoplastic infiltration of the submucosa and muscularis 3.  Right breast cancer 08/28/2015-pT2pN0, stage IIa invasive ductal carcinoma, grade 1, HER2 negative, ER positive, PR positive-adjuvant radiation and anastrozole 12/29/2015-May 2022 4.  Left breast cancer 08/28/2015,pT1cpN0, stage Ia invasive ductal carcinoma, grade 1, HER2 negative, ER positive, PR positive-adjuvant radiation, anastrozole 12/29/2015-May 2022  5.  Hypertension  6.  Upper abdominal pain-likely secondary to metastatic disease involving the liver and the cecal tumor  7.  Port-A-Cath placement 07/23/2022    Disposition: Christina [name] appears stable.  She will complete cycle 1 FOLFOX today.  The antiemetic regimen will be adjusted due to her allergy with Zofran.  She will return for an office visit and cycle 2 FOLFOX in 2 weeks.  She will use a stool softener and MiraLAX for constipation.  [name], MD  07/28/2022  12:52 PM

## 2022-07-28 NOTE — Patient Instructions (Addendum)
Christina [name]   The chemotherapy medication bag should finish at 46 hours, 96 hours, or 7 days. For example, if your pump is scheduled for 46 hours and it was put on at 4:00 p.m., it should finish at 2:00 p.m. the day it is scheduled to come off regardless of your appointment time.     Estimated time to finish at 3:15 Friday, July 30, 2022.   If the display on your pump reads "Low Volume" and it is beeping, take the batteries out of the pump and come to the cancer center for it to be taken off.   If the pump alarms go off prior to the pump reading "Low Volume" then call [phone] and someone can assist you.  If the plunger comes out and the chemotherapy medication is leaking out, please use your home chemo spill kit to clean up the spill. Do NOT use paper towels or other household products.  If you have problems or questions regarding your pump, please call either [phone] (24 hours a day) or the cancer center Monday-Friday 8:00 a.m.- 4:30 p.m. at the clinic number and we will assist you. If you are unable to get assistance, then go to the nearest Emergency Department and ask the staff to contact the IV team for assistance.  Discharge Instructions: Thank you for choosing [facility] to provide your oncology and hematology care.   If you have a lab appointment with the [facility], please go directly to the [facility] and check in at the registration area.   Wear comfortable clothing and clothing appropriate for easy access to any Portacath or PICC line.   We strive to give you quality time with your provider. You may need to reschedule your appointment if you arrive late (15 or more minutes).  Arriving late affects you and other patients whose appointments are after yours.  Also, if you miss three or more appointments without notifying the office, you may be dismissed from the clinic at the provider's discretion.      For prescription  refill requests, have your pharmacy contact our office and allow 72 hours for refills to be completed.    Today you received the following chemotherapy and/or immunotherapy agents Oxaliplatin, Leucovorin, Fluorouracil.      To help prevent nausea and vomiting after your treatment, we encourage you to take your nausea medication as directed.  BELOW ARE SYMPTOMS THAT SHOULD BE REPORTED IMMEDIATELY: *FEVER GREATER THAN 100.4 F (38 C) OR HIGHER *CHILLS OR SWEATING *NAUSEA AND VOMITING THAT IS NOT CONTROLLED WITH YOUR NAUSEA MEDICATION *UNUSUAL SHORTNESS OF BREATH *UNUSUAL BRUISING OR BLEEDING *URINARY PROBLEMS (pain or burning when urinating, or frequent urination) *BOWEL PROBLEMS (unusual diarrhea, constipation, pain near the anus) TENDERNESS IN MOUTH AND THROAT WITH OR WITHOUT PRESENCE OF ULCERS (sore throat, sores in mouth, or a toothache) UNUSUAL RASH, SWELLING OR PAIN  UNUSUAL VAGINAL DISCHARGE OR ITCHING   Items with * indicate a potential emergency and should be followed up as soon as possible or go to the Emergency Department if any problems should occur.  Please show the CHEMOTHERAPY ALERT CARD or IMMUNOTHERAPY ALERT CARD at check-in to the Emergency Department and triage nurse.  Should you have questions after your visit or need to cancel or reschedule your appointment, please contact [facility]  Dept: [phone]  and follow the prompts.  Office hours are 8:00 a.m. to 4:30 p.m. Monday - Friday. Please note that voicemails  left after 4:00 p.m. may not be returned until the following business day.  We are closed weekends and major holidays. You have access to a nurse at all times for urgent questions. Please call the main number to the clinic Dept: [phone] and follow the prompts.   For any non-urgent questions, you may also contact your provider using MyChart. We now offer e-Visits for anyone 52 and older to request care online for non-urgent  symptoms. For details visit [email or web address].   Also download the MyChart app! Go to the app store, search "MyChart", open the app, select [facility], and log in with your MyChart username and password.  Masks are optional in the cancer centers. If you would like for your care team to wear a mask while they are taking care of you, please let them know. You may have one support person who is at least 74 years old accompany you for your appointments.  Oxaliplatin Injection What is this medication? OXALIPLATIN (ox AL i PLA tin) treats some types of cancer. It works by slowing down the growth of cancer cells. This medicine may be used for other purposes; ask your health care provider or pharmacist if you have questions. COMMON BRAND NAME(S): Eloxatin What should I tell my care team before I take this medication? They need to know if you have any of these conditions: Heart disease History of irregular heartbeat or rhythm Liver disease Low blood cell levels (white cells, red cells, and platelets) Lung or breathing disease, such as asthma Take medications that treat or prevent blood clots Tingling of the fingers, toes, or other nerve disorder An unusual or allergic reaction to oxaliplatin, other medications, foods, dyes, or preservatives If you or your partner are pregnant or trying to get pregnant Breast-feeding How should I use this medication? This medication is injected into a vein. It is given by your care team in a hospital or clinic setting. Talk to your care team about the use of this medication in children. Special care may be needed. Overdosage: If you think you have taken too much of this medicine contact a poison control center or emergency room at once. NOTE: This medicine is only for you. Do not share this medicine with others. What if I miss a dose? Keep appointments for follow-up doses. It is important not to miss a dose. Call your care team if you are unable to keep an  appointment. What may interact with this medication? Do not take this medication with any of the following: Cisapride Dronedarone Pimozide Thioridazine This medication may also interact with the following: Aspirin and aspirin-like medications Certain medications that treat or prevent blood clots, such as warfarin, apixaban, dabigatran, and rivaroxaban Cisplatin Cyclosporine Diuretics Medications for infection, such as acyclovir, adefovir, amphotericin B, bacitracin, cidofovir, foscarnet, ganciclovir, gentamicin, pentamidine, vancomycin NSAIDs, medications for pain and inflammation, such as ibuprofen or naproxen Other medications that cause heart rhythm changes Pamidronate Zoledronic acid This list may not describe all possible interactions. Give your health care provider a list of all the medicines, herbs, non-prescription drugs, or dietary supplements you use. Also tell them if you smoke, drink alcohol, or use illegal drugs. Some items may interact with your medicine. What should I watch for while using this medication? Your condition will be monitored carefully while you are receiving this medication. You may need blood work while taking this medication. This medication may make you feel generally unwell. This is not uncommon as chemotherapy can affect healthy cells as well as  cancer cells. Report any side effects. Continue your course of treatment even though you feel ill unless your care team tells you to stop. This medication may increase your risk of getting an infection. Call your care team for advice if you get a fever, chills, sore throat, or other symptoms of a cold or flu. Do not treat yourself. Try to avoid being around people who are sick. Avoid taking medications that contain aspirin, acetaminophen, ibuprofen, naproxen, or ketoprofen unless instructed by your care team. These medications may hide a fever. Be careful brushing or flossing your teeth or using a toothpick because  you may get an infection or bleed more easily. If you have any dental work done, tell your dentist you are receiving this medication. This medication can make you more sensitive to cold. Do not drink cold drinks or use ice. Cover exposed skin before coming in contact with cold temperatures or cold objects. When out in cold weather wear warm clothing and cover your mouth and nose to warm the air that goes into your lungs. Tell your care team if you get sensitive to the cold. Talk to your care team if you or your partner are pregnant or think either of you might be pregnant. This medication can cause serious birth defects if taken during pregnancy and for 9 months after the last dose. A negative pregnancy test is required before starting this medication. A reliable form of contraception is recommended while taking this medication and for 9 months after the last dose. Talk to your care team about effective forms of contraception. Do not father a child while taking this medication and for 6 months after the last dose. Use a condom while having sex during this time period. Do not breastfeed while taking this medication and for 3 months after the last dose. This medication may cause infertility. Talk to your care team if you are concerned about your fertility. What side effects may I notice from receiving this medication? Side effects that you should report to your care team as soon as possible: Allergic reactions--skin rash, itching, hives, swelling of the face, lips, tongue, or throat Bleeding--bloody or black, tar-like stools, vomiting blood or brown material that looks like coffee grounds, red or dark brown urine, small red or purple spots on skin, unusual bruising or bleeding Dry cough, shortness of breath or trouble breathing Heart rhythm changes--fast or irregular heartbeat, dizziness, feeling faint or lightheaded, chest pain, trouble breathing Infection--fever, chills, cough, sore throat, wounds that  don't heal, pain or trouble when passing urine, general feeling of discomfort or being unwell Liver injury--right upper belly pain, loss of appetite, nausea, light-colored stool, dark yellow or brown urine, yellowing skin or eyes, unusual weakness or fatigue Low red blood cell level--unusual weakness or fatigue, dizziness, headache, trouble breathing Muscle injury--unusual weakness or fatigue, muscle pain, dark yellow or brown urine, decrease in amount of urine Pain, tingling, or numbness in the hands or feet Sudden and severe headache, confusion, change in vision, seizures, which may be signs of posterior reversible encephalopathy syndrome (PRES) Unusual bruising or bleeding Side effects that usually do not require medical attention (report to your care team if they continue or are bothersome): Diarrhea Nausea Pain, redness, or swelling with sores inside the mouth or throat Unusual weakness or fatigue Vomiting This list may not describe all possible side effects. Call your doctor for medical advice about side effects. You may report side effects to FDA at 1-800-FDA-1088. Where should I keep my medication?   This medication is given in a hospital or clinic. It will not be stored at home. NOTE: This sheet is a summary. It may not cover all possible information. If you have questions about this medicine, talk to your doctor, pharmacist, or health care provider.  2023 Elsevier/Gold Standard (2007-09-30 00:00:00)  Leucovorin Injection What is this medication? LEUCOVORIN (loo koe VOR in) prevents side effects from certain medications, such as methotrexate. It works by increasing folate levels. This helps protect healthy cells in your body. It may also be used to treat anemia caused by low levels of folate. It can also be used with fluorouracil, a type of chemotherapy, to treat colorectal cancer. It works by increasing the effects of fluorouracil in the body. This medicine may be used for other  purposes; ask your health care provider or pharmacist if you have questions. What should I tell my care team before I take this medication? They need to know if you have any of these conditions: Anemia from low levels of vitamin B12 in the blood An unusual or allergic reaction to leucovorin, folic acid, other medications, foods, dyes, or preservatives Pregnant or trying to get pregnant Breastfeeding How should I use this medication? This medication is injected into a vein or a muscle. It is given by your care team in a hospital or clinic setting. Talk to your care team about the use of this medication in children. Special care may be needed. Overdosage: If you think you have taken too much of this medicine contact a poison control center or emergency room at once. NOTE: This medicine is only for you. Do not share this medicine with others. What if I miss a dose? Keep appointments for follow-up doses. It is important not to miss your dose. Call your care team if you are unable to keep an appointment. What may interact with this medication? Capecitabine Fluorouracil Phenobarbital Phenytoin Primidone Trimethoprim;sulfamethoxazole This list may not describe all possible interactions. Give your health care provider a list of all the medicines, herbs, non-prescription drugs, or dietary supplements you use. Also tell them if you smoke, drink alcohol, or use illegal drugs. Some items may interact with your medicine. What should I watch for while using this medication? Your condition will be monitored carefully while you are receiving this medication. This medication may increase the side effects of 5-fluorouracil. Tell your care team if you have diarrhea or mouth sores that do not get better or that get worse. What side effects may I notice from receiving this medication? Side effects that you should report to your care team as soon as possible: Allergic reactions--skin rash, itching, hives,  swelling of the face, lips, tongue, or throat This list may not describe all possible side effects. Call your doctor for medical advice about side effects. You may report side effects to FDA at 1-800-FDA-1088. Where should I keep my medication? This medication is given in a hospital or clinic. It will not be stored at home. NOTE: This sheet is a summary. It may not cover all possible information. If you have questions about this medicine, talk to your doctor, pharmacist, or health care provider.  2023 Elsevier/Gold Standard (2022-01-12 00:00:00)  Fluorouracil Injection What is this medication? FLUOROURACIL (flure oh YOOR a sil) treats some types of cancer. It works by slowing down the growth of cancer cells. This medicine may be used for other purposes; ask your health care provider or pharmacist if you have questions. COMMON BRAND NAME(S): Adrucil What should I tell  my care team before I take this medication? They need to know if you have any of these conditions: Blood disorders Dihydropyrimidine dehydrogenase (DPD) deficiency Infection, such as chickenpox, cold sores, herpes Kidney disease Liver disease Poor nutrition Recent or ongoing radiation therapy An unusual or allergic reaction to fluorouracil, other medications, foods, dyes, or preservatives If you or your partner are pregnant or trying to get pregnant Breast-feeding How should I use this medication? This medication is injected into a vein. It is administered by your care team in a hospital or clinic setting. Talk to your care team about the use of this medication in children. Special care may be needed. Overdosage: If you think you have taken too much of this medicine contact a poison control center or emergency room at once. NOTE: This medicine is only for you. Do not share this medicine with others. What if I miss a dose? Keep appointments for follow-up doses. It is important not to miss your dose. Call your care team if  you are unable to keep an appointment. What may interact with this medication? Do not take this medication with any of the following: Live virus vaccines This medication may also interact with the following: Medications that treat or prevent blood clots, such as warfarin, enoxaparin, dalteparin This list may not describe all possible interactions. Give your health care provider a list of all the medicines, herbs, non-prescription drugs, or dietary supplements you use. Also tell them if you smoke, drink alcohol, or use illegal drugs. Some items may interact with your medicine. What should I watch for while using this medication? Your condition will be monitored carefully while you are receiving this medication. This medication may make you feel generally unwell. This is not uncommon as chemotherapy can affect healthy cells as well as cancer cells. Report any side effects. Continue your course of treatment even though you feel ill unless your care team tells you to stop. In some cases, you may be given additional medications to help with side effects. Follow all directions for their use. This medication may increase your risk of getting an infection. Call your care team for advice if you get a fever, chills, sore throat, or other symptoms of a cold or flu. Do not treat yourself. Try to avoid being around people who are sick. This medication may increase your risk to bruise or bleed. Call your care team if you notice any unusual bleeding. Be careful brushing or flossing your teeth or using a toothpick because you may get an infection or bleed more easily. If you have any dental work done, tell your dentist you are receiving this medication. Avoid taking medications that contain aspirin, acetaminophen, ibuprofen, naproxen, or ketoprofen unless instructed by your care team. These medications may hide a fever. Do not treat diarrhea with over the counter products. Contact your care team if you have diarrhea  that lasts more than 2 days or if it is severe and watery. This medication can make you more sensitive to the sun. Keep out of the sun. If you cannot avoid being in the sun, wear protective clothing and sunscreen. Do not use sun lamps, tanning beds, or tanning booths. Talk to your care team if you or your partner wish to become pregnant or think you might be pregnant. This medication can cause serious birth defects if taken during pregnancy and for 3 months after the last dose. A reliable form of contraception is recommended while taking this medication and for 3 months after  the last dose. Talk to your care team about effective forms of contraception. Do not father a child while taking this medication and for 3 months after the last dose. Use a condom while having sex during this time period. Do not breastfeed while taking this medication. This medication may cause infertility. Talk to your care team if you are concerned about your fertility. What side effects may I notice from receiving this medication? Side effects that you should report to your care team as soon as possible: Allergic reactions--skin rash, itching, hives, swelling of the face, lips, tongue, or throat Heart attack--pain or tightness in the chest, shoulders, arms, or jaw, nausea, shortness of breath, cold or clammy skin, feeling faint or lightheaded Heart failure--shortness of breath, swelling of the ankles, feet, or hands, sudden weight gain, unusual weakness or fatigue Heart rhythm changes--fast or irregular heartbeat, dizziness, feeling faint or lightheaded, chest pain, trouble breathing High ammonia level--unusual weakness or fatigue, confusion, loss of appetite, nausea, vomiting, seizures Infection--fever, chills, cough, sore throat, wounds that don't heal, pain or trouble when passing urine, general feeling of discomfort or being unwell Low red blood cell level--unusual weakness or fatigue, dizziness, headache, trouble  breathing Pain, tingling, or numbness in the hands or feet, muscle weakness, change in vision, confusion or trouble speaking, loss of balance or coordination, trouble walking, seizures Redness, swelling, and blistering of the skin over hands and feet Severe or prolonged diarrhea Unusual bruising or bleeding Side effects that usually do not require medical attention (report to your care team if they continue or are bothersome): Dry skin Headache Increased tears Nausea Pain, redness, or swelling with sores inside the mouth or throat Sensitivity to light Vomiting This list may not describe all possible side effects. Call your doctor for medical advice about side effects. You may report side effects to FDA at 1-800-FDA-1088. Where should I keep my medication? This medication is given in a hospital or clinic. It will not be stored at home. NOTE: This sheet is a summary. It may not cover all possible information. If you have questions about this medicine, talk to your doctor, pharmacist, or health care provider.  2023 Elsevier/Gold Standard (2021-12-08 00:00:00)  

## 2022-07-29 ENCOUNTER — Telehealth: Payer: Self-pay | Admitting: Emergency Medicine

## 2022-07-29 ENCOUNTER — Other Ambulatory Visit: Payer: Self-pay

## 2022-07-29 NOTE — Telephone Encounter (Signed)
24 Hour Callback 24 Hour callback post 1st time Folfox treatment. Telephone call to patient, no answer, left voicemail message with return phone number and instructions to call with any problems or questions

## 2022-07-30 ENCOUNTER — Other Ambulatory Visit: Payer: Self-pay

## 2022-07-30 ENCOUNTER — Inpatient Hospital Stay: Payer: Medicare Other

## 2022-07-30 VITALS — BP 111/68 | HR 95 | Temp 98.2°F | Resp 18

## 2022-07-30 DIAGNOSIS — C182 Malignant neoplasm of ascending colon: Secondary | ICD-10-CM

## 2022-07-30 DIAGNOSIS — Z5111 Encounter for antineoplastic chemotherapy: Secondary | ICD-10-CM | POA: Diagnosis not present

## 2022-07-30 MED ORDER — HEPARIN SOD (PORK) LOCK FLUSH 100 UNIT/ML IV SOLN
500.0000 [IU] | Freq: Once | INTRAVENOUS | Status: AC | PRN
  Administered 2022-07-30: 500 [IU]

## 2022-07-30 MED ORDER — SODIUM CHLORIDE 0.9% FLUSH
10.0000 mL | INTRAVENOUS | Status: DC | PRN
  Administered 2022-07-30: 10 mL

## 2022-07-30 NOTE — Patient Instructions (Signed)

## 2022-08-03 ENCOUNTER — Other Ambulatory Visit (HOSPITAL_COMMUNITY): Payer: Self-pay

## 2022-08-03 ENCOUNTER — Encounter: Payer: Self-pay | Admitting: Oncology

## 2022-08-08 ENCOUNTER — Other Ambulatory Visit: Payer: Self-pay | Admitting: Oncology

## 2022-08-10 ENCOUNTER — Inpatient Hospital Stay: Payer: Medicare Other

## 2022-08-10 ENCOUNTER — Inpatient Hospital Stay (HOSPITAL_BASED_OUTPATIENT_CLINIC_OR_DEPARTMENT_OTHER): Payer: Medicare Other | Admitting: Nurse Practitioner

## 2022-08-10 ENCOUNTER — Encounter: Payer: Self-pay | Admitting: Nurse Practitioner

## 2022-08-10 VITALS — BP 121/69 | HR 93 | Temp 98.2°F | Resp 18 | Ht 66.0 in | Wt 151.0 lb

## 2022-08-10 DIAGNOSIS — Z5111 Encounter for antineoplastic chemotherapy: Secondary | ICD-10-CM | POA: Diagnosis not present

## 2022-08-10 DIAGNOSIS — C182 Malignant neoplasm of ascending colon: Secondary | ICD-10-CM

## 2022-08-10 DIAGNOSIS — Z95828 Presence of other vascular implants and grafts: Secondary | ICD-10-CM

## 2022-08-10 DIAGNOSIS — C801 Malignant (primary) neoplasm, unspecified: Secondary | ICD-10-CM

## 2022-08-10 LAB — CMP (CANCER CENTER ONLY)
ALT: 8 U/L (ref 0–44)
AST: 23 U/L (ref 15–41)
Albumin: 4 g/dL (ref 3.5–5.0)
Alkaline Phosphatase: 164 U/L — ABNORMAL HIGH (ref 38–126)
Anion gap: 10 (ref 5–15)
BUN: 10 mg/dL (ref 8–23)
CO2: 27 mmol/L (ref 22–32)
Calcium: 9 mg/dL (ref 8.9–10.3)
Chloride: 101 mmol/L (ref 98–111)
Creatinine: 0.84 mg/dL (ref 0.44–1.00)
GFR, Estimated: >60 mL/min (ref >60)
Glucose, Bld: 99 mg/dL (ref 70–99)
Potassium: 3.3 mmol/L — ABNORMAL LOW (ref 3.5–5.1)
Sodium: 138 mmol/L (ref 135–145)
Total Bilirubin: 0.6 mg/dL (ref 0.3–1.2)
Total Protein: 7.1 g/dL (ref 6.5–8.1)

## 2022-08-10 LAB — CEA (ACCESS): CEA (CHCC): 2470.6 ng/mL — ABNORMAL HIGH (ref 0.00–5.00)

## 2022-08-10 LAB — CBC WITH DIFFERENTIAL (CANCER CENTER ONLY)
Abs Immature Granulocytes: 0.01 10*3/uL (ref 0.00–0.07)
Basophils Absolute: 0 10*3/uL (ref 0.0–0.1)
Basophils Relative: 1 %
Eosinophils Absolute: 0.2 10*3/uL (ref 0.0–0.5)
Eosinophils Relative: 5 %
HCT: 29.7 % — ABNORMAL LOW (ref 36.0–46.0)
Hemoglobin: 9.2 g/dL — ABNORMAL LOW (ref 12.0–15.0)
Immature Granulocytes: 0 %
Lymphocytes Relative: 31 %
Lymphs Abs: 1 10*3/uL (ref 0.7–4.0)
MCH: 25.2 pg — ABNORMAL LOW (ref 26.0–34.0)
MCHC: 31 g/dL (ref 30.0–36.0)
MCV: 81.4 fL (ref 80.0–100.0)
Monocytes Absolute: 0.5 10*3/uL (ref 0.1–1.0)
Monocytes Relative: 15 %
Neutro Abs: 1.5 10*3/uL — ABNORMAL LOW (ref 1.7–7.7)
Neutrophils Relative %: 48 %
Platelet Count: 254 10*3/uL (ref 150–400)
RBC: 3.65 MIL/uL — ABNORMAL LOW (ref 3.87–5.11)
RDW: 14.7 % (ref 11.5–15.5)
WBC Count: 3.1 10*3/uL — ABNORMAL LOW (ref 4.0–10.5)
nRBC: 0 % (ref 0.0–0.2)

## 2022-08-10 MED ORDER — HEPARIN SOD (PORK) LOCK FLUSH 100 UNIT/ML IV SOLN
500.0000 [IU] | Freq: Once | INTRAVENOUS | Status: AC
  Administered 2022-08-10: 500 [IU] via INTRAVENOUS

## 2022-08-10 MED ORDER — SODIUM CHLORIDE 0.9% FLUSH
10.0000 mL | INTRAVENOUS | Status: DC | PRN
  Administered 2022-08-10: 10 mL via INTRAVENOUS

## 2022-08-10 NOTE — Progress Notes (Signed)
  [facility] OFFICE PROGRESS NOTE   Diagnosis: Colon cancer  INTERVAL HISTORY:   Christina [name] returns as scheduled.  She completed cycle 1 FOLFOX 07/28/2022.  She denies significant nausea/vomiting.  No mouth sores.  She had recent constipation.  She took MiraLAX and then had loose stools yesterday.  None so far today.  She denies significant cold sensitivity.  1 day she noticed some tingling in her fingertips.  This has resolved.  She denies pain.  Objective:  Vital signs in last 24 hours:  Blood pressure 121/69, pulse 93, temperature 98.2 F (36.8 C), temperature source Oral, resp. rate 18, height _0  (1.676 m), weight 151 lb (68.5 kg), SpO2 100 %.    HEENT: Mild white coating over tongue.  No buccal thrush. Resp: Lungs clear bilaterally. Cardio: Regular rate and rhythm. GI: Abdomen soft and nontender.  No hepatomegaly. Vascular: No leg edema. Skin: Palms without erythema. Port-A-Cath without erythema.   Lab Results:  Lab Results  Component Value Date   WBC 3.1 (L) 08/10/2022   HGB 9.2 (L) 08/10/2022   HCT 29.7 (L) 08/10/2022   MCV 81.4 08/10/2022   PLT 254 08/10/2022   NEUTROABS 1.5 (L) 08/10/2022    Imaging:  No results found.  Medications: I have reviewed the patient's current medications.  Assessment/Plan: Metastatic colon cancer CT abdomen/pelvis 06/22/2022, interval appendectomy, extensive interstitial and extraluminal gas surrounding the surgical staples at the resection margin, extensive cecal wall and terminal ileum thickening, pathologic adenopathy in the ileocolic lymph nodes, interval development of by lobar hypoenhancing hepatic masses Ultrasound-guided biopsy of left liver mass 06/28/2022-static adenocarcinoma, CDX2 and CK20 positive; microsatellite stable, tumor mutation burden 4, K-ras G13D Colonoscopy 07/19/2022-malignant tumor in the cecum-moderately differentiated adenocarcinoma with mucinous features and background of high-grade  dysplasia, ileocecal valve polyp-negative for malignancy Cycle 1 FOLFOX 07/28/2022 Cycle 2 FOLFOX 08/11/2022, Udenyca 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  Right breast cancer 08/28/2015-pT2pN0, stage IIa invasive ductal carcinoma, grade 1, HER2 negative, ER positive, PR positive-adjuvant radiation and anastrozole 12/29/2015-May 2022 4.  Left breast cancer 08/28/2015,pT1cpN0, stage Ia invasive ductal carcinoma, grade 1, HER2 negative, ER positive, PR positive-adjuvant radiation, anastrozole 12/29/2015-May 2022   5.  Hypertension   6.  Upper abdominal pain-likely secondary to metastatic disease involving the liver and the cecal tumor   7.  Port-A-Cath placement 07/23/2022  Disposition: Christina [name] appears stable.  She has completed 1 cycle of FOLFOX.  She tolerated well.  Plan to proceed with cycle 2 as scheduled.  CBC from today shows mild neutropenia.  Neutropenic precautions reviewed.  We discussed white cell growth factor support on the day of pump discontinuation.  We reviewed potential toxicities including bone pain, skin rash, splenic rupture.  She agrees to proceed.  She will return for follow-up in 2 weeks.  We are available to see her sooner if needed.    Ned Card ANP/GNP-BC   08/10/2022  2:09 PM

## 2022-08-10 NOTE — Research (Signed)
[employer] 2021-05 - Specimen Collection Study to Evaluate Biomarkers in Subjects with Cancer    Met with patient and daughter this afternoon during patient's scheduled clinic visit. Blood specimen has been drawn and submitted to study. Patient had verbally consented to participate in study on the 29th of November (see research note) and patient had daughter sign for her, due to pain level at the time. After discussing situation with Geophysicist/field seismologist of daughter signing for patient, it was recommended to obtain signature from patient. I informed patient of the situation and that she has completed the study and nothing has changed, we were just asking for patient to sign consent with her signature. Patient gave her verbal understanding and denied questions and proceeded to sign and date consent (with original date of verbal consent) where indicated.  Patient thanked for her time and understanding and contribution to the study. Patient encouraged to reach out to MD or research for questions/concerns.   Maxwell Marion, RN, BSN, [facility] Clinical Research Nurse Lead 08/10/2022 2:22 PM

## 2022-08-11 ENCOUNTER — Inpatient Hospital Stay: Payer: Medicare Other

## 2022-08-11 ENCOUNTER — Other Ambulatory Visit: Payer: Self-pay

## 2022-08-11 ENCOUNTER — Inpatient Hospital Stay: Payer: Medicare Other | Admitting: Nutrition

## 2022-08-11 ENCOUNTER — Inpatient Hospital Stay: Payer: Medicare Other | Admitting: Nurse Practitioner

## 2022-08-11 VITALS — BP 129/68 | HR 70 | Temp 98.5°F | Resp 18

## 2022-08-11 DIAGNOSIS — C182 Malignant neoplasm of ascending colon: Secondary | ICD-10-CM

## 2022-08-11 DIAGNOSIS — Z5111 Encounter for antineoplastic chemotherapy: Secondary | ICD-10-CM | POA: Diagnosis not present

## 2022-08-11 MED ORDER — DEXTROSE 5 % IV SOLN: Freq: Once | INTRAVENOUS | Status: AC

## 2022-08-11 MED ORDER — SODIUM CHLORIDE 0.9 % IV SOLN
2400.0000 mg/m2 | INTRAVENOUS | Status: DC
  Administered 2022-08-11: 4350 mg via INTRAVENOUS
  Filled 2022-08-11: qty 87

## 2022-08-11 MED ORDER — OXALIPLATIN CHEMO INJECTION 100 MG/20ML
83.0000 mg/m2 | Freq: Once | INTRAVENOUS | Status: AC
  Administered 2022-08-11: 150 mg via INTRAVENOUS
  Filled 2022-08-11: qty 20

## 2022-08-11 MED ORDER — PROCHLORPERAZINE MALEATE 10 MG PO TABS
10.0000 mg | ORAL_TABLET | Freq: Once | ORAL | Status: AC
  Administered 2022-08-11: 10 mg via ORAL
  Filled 2022-08-11: qty 1

## 2022-08-11 MED ORDER — SODIUM CHLORIDE 0.9 % IV SOLN
150.0000 mg | Freq: Once | INTRAVENOUS | Status: AC
  Administered 2022-08-11: 150 mg via INTRAVENOUS
  Filled 2022-08-11: qty 150

## 2022-08-11 MED ORDER — FLUOROURACIL CHEMO INJECTION 2.5 GM/50ML
400.0000 mg/m2 | Freq: Once | INTRAVENOUS | Status: AC
  Administered 2022-08-11: 750 mg via INTRAVENOUS
  Filled 2022-08-11: qty 15

## 2022-08-11 MED ORDER — LEUCOVORIN CALCIUM INJECTION 350 MG
400.0000 mg/m2 | Freq: Once | INTRAVENOUS | Status: AC
  Administered 2022-08-11: 728 mg via INTRAVENOUS
  Filled 2022-08-11: qty 36.4

## 2022-08-11 MED ORDER — SODIUM CHLORIDE 0.9 % IV SOLN
10.0000 mg | Freq: Once | INTRAVENOUS | Status: AC
  Administered 2022-08-11: 10 mg via INTRAVENOUS
  Filled 2022-08-11: qty 10

## 2022-08-11 NOTE — Patient Instructions (Addendum)
[facility]   The chemotherapy medication bag should finish at 46 hours, 96 hours, or 7 days. For example, if your pump is scheduled for 46 hours and it was put on at 4:00 p.m., it should finish at 2:00 p.m. the day it is scheduled to come off regardless of your appointment time.     Estimated time to finish at 12:00 Friday, August 13, 2022.   If the display on your pump reads "Low Volume" and it is beeping, take the batteries out of the pump and come to the cancer center for it to be taken off.   If the pump alarms go off prior to the pump reading "Low Volume" then call [phone] and someone can assist you.  If the plunger comes out and the chemotherapy medication is leaking out, please use your home chemo spill kit to clean up the spill. Do NOT use paper towels or other household products.  If you have problems or questions regarding your pump, please call either [phone] (24 hours a day) or the cancer center Monday-Friday 8:00 a.m.- 4:30 p.m. at the clinic number and we will assist you. If you are unable to get assistance, then go to the nearest Emergency Department and ask the staff to contact the IV team for assistance.  Discharge Instructions: Thank you for choosing [facility] to provide your oncology and hematology care.   If you have a lab appointment with the [facility], please go directly to the [facility] and check in at the registration area.   Wear comfortable clothing and clothing appropriate for easy access to any Portacath or PICC line.   We strive to give you quality time with your provider. You may need to reschedule your appointment if you arrive late (15 or more minutes).  Arriving late affects you and other patients whose appointments are after yours.  Also, if you miss three or more appointments without notifying the office, you may be dismissed from the clinic at the provider's discretion.      For prescription  refill requests, have your pharmacy contact our office and allow 72 hours for refills to be completed.    Today you received the following chemotherapy and/or immunotherapy agents Oxaliplatin, Leucovorin, Fluorouracil.      To help prevent nausea and vomiting after your treatment, we encourage you to take your nausea medication as directed.  BELOW ARE SYMPTOMS THAT SHOULD BE REPORTED IMMEDIATELY: *FEVER GREATER THAN 100.4 F (38 C) OR HIGHER *CHILLS OR SWEATING *NAUSEA AND VOMITING THAT IS NOT CONTROLLED WITH YOUR NAUSEA MEDICATION *UNUSUAL SHORTNESS OF BREATH *UNUSUAL BRUISING OR BLEEDING *URINARY PROBLEMS (pain or burning when urinating, or frequent urination) *BOWEL PROBLEMS (unusual diarrhea, constipation, pain near the anus) TENDERNESS IN MOUTH AND THROAT WITH OR WITHOUT PRESENCE OF ULCERS (sore throat, sores in mouth, or a toothache) UNUSUAL RASH, SWELLING OR PAIN  UNUSUAL VAGINAL DISCHARGE OR ITCHING   Items with * indicate a potential emergency and should be followed up as soon as possible or go to the Emergency Department if any problems should occur.  Please show the CHEMOTHERAPY ALERT CARD or IMMUNOTHERAPY ALERT CARD at check-in to the Emergency Department and triage nurse.  Should you have questions after your visit or need to cancel or reschedule your appointment, please contact [facility]  Dept: [phone]  and follow the prompts.  Office hours are 8:00 a.m. to 4:30 p.m. Monday - Friday. Please note that voicemails  left after 4:00 p.m. may not be returned until the following business day.  We are closed weekends and major holidays. You have access to a nurse at all times for urgent questions. Please call the main number to the clinic Dept: [phone] and follow the prompts.   For any non-urgent questions, you may also contact your provider using MyChart. We now offer e-Visits for anyone 52 and older to request care online for non-urgent  symptoms. For details visit [email or web address].   Also download the MyChart app! Go to the app store, search "MyChart", open the app, select [facility], and log in with your MyChart username and password.  Masks are optional in the cancer centers. If you would like for your care team to wear a mask while they are taking care of you, please let them know. You may have one support person who is at least 74 years old accompany you for your appointments.  Oxaliplatin Injection What is this medication? OXALIPLATIN (ox AL i PLA tin) treats some types of cancer. It works by slowing down the growth of cancer cells. This medicine may be used for other purposes; ask your health care provider or pharmacist if you have questions. COMMON BRAND NAME(S): Eloxatin What should I tell my care team before I take this medication? They need to know if you have any of these conditions: Heart disease History of irregular heartbeat or rhythm Liver disease Low blood cell levels (white cells, red cells, and platelets) Lung or breathing disease, such as asthma Take medications that treat or prevent blood clots Tingling of the fingers, toes, or other nerve disorder An unusual or allergic reaction to oxaliplatin, other medications, foods, dyes, or preservatives If you or your partner are pregnant or trying to get pregnant Breast-feeding How should I use this medication? This medication is injected into a vein. It is given by your care team in a hospital or clinic setting. Talk to your care team about the use of this medication in children. Special care may be needed. Overdosage: If you think you have taken too much of this medicine contact a poison control center or emergency room at once. NOTE: This medicine is only for you. Do not share this medicine with others. What if I miss a dose? Keep appointments for follow-up doses. It is important not to miss a dose. Call your care team if you are unable to keep an  appointment. What may interact with this medication? Do not take this medication with any of the following: Cisapride Dronedarone Pimozide Thioridazine This medication may also interact with the following: Aspirin and aspirin-like medications Certain medications that treat or prevent blood clots, such as warfarin, apixaban, dabigatran, and rivaroxaban Cisplatin Cyclosporine Diuretics Medications for infection, such as acyclovir, adefovir, amphotericin B, bacitracin, cidofovir, foscarnet, ganciclovir, gentamicin, pentamidine, vancomycin NSAIDs, medications for pain and inflammation, such as ibuprofen or naproxen Other medications that cause heart rhythm changes Pamidronate Zoledronic acid This list may not describe all possible interactions. Give your health care provider a list of all the medicines, herbs, non-prescription drugs, or dietary supplements you use. Also tell them if you smoke, drink alcohol, or use illegal drugs. Some items may interact with your medicine. What should I watch for while using this medication? Your condition will be monitored carefully while you are receiving this medication. You may need blood work while taking this medication. This medication may make you feel generally unwell. This is not uncommon as chemotherapy can affect healthy cells as well as  cancer cells. Report any side effects. Continue your course of treatment even though you feel ill unless your care team tells you to stop. This medication may increase your risk of getting an infection. Call your care team for advice if you get a fever, chills, sore throat, or other symptoms of a cold or flu. Do not treat yourself. Try to avoid being around people who are sick. Avoid taking medications that contain aspirin, acetaminophen, ibuprofen, naproxen, or ketoprofen unless instructed by your care team. These medications may hide a fever. Be careful brushing or flossing your teeth or using a toothpick because  you may get an infection or bleed more easily. If you have any dental work done, tell your dentist you are receiving this medication. This medication can make you more sensitive to cold. Do not drink cold drinks or use ice. Cover exposed skin before coming in contact with cold temperatures or cold objects. When out in cold weather wear warm clothing and cover your mouth and nose to warm the air that goes into your lungs. Tell your care team if you get sensitive to the cold. Talk to your care team if you or your partner are pregnant or think either of you might be pregnant. This medication can cause serious birth defects if taken during pregnancy and for 9 months after the last dose. A negative pregnancy test is required before starting this medication. A reliable form of contraception is recommended while taking this medication and for 9 months after the last dose. Talk to your care team about effective forms of contraception. Do not father a child while taking this medication and for 6 months after the last dose. Use a condom while having sex during this time period. Do not breastfeed while taking this medication and for 3 months after the last dose. This medication may cause infertility. Talk to your care team if you are concerned about your fertility. What side effects may I notice from receiving this medication? Side effects that you should report to your care team as soon as possible: Allergic reactions--skin rash, itching, hives, swelling of the face, lips, tongue, or throat Bleeding--bloody or black, tar-like stools, vomiting blood or brown material that looks like coffee grounds, red or dark brown urine, small red or purple spots on skin, unusual bruising or bleeding Dry cough, shortness of breath or trouble breathing Heart rhythm changes--fast or irregular heartbeat, dizziness, feeling faint or lightheaded, chest pain, trouble breathing Infection--fever, chills, cough, sore throat, wounds that  don't heal, pain or trouble when passing urine, general feeling of discomfort or being unwell Liver injury--right upper belly pain, loss of appetite, nausea, light-colored stool, dark yellow or brown urine, yellowing skin or eyes, unusual weakness or fatigue Low red blood cell level--unusual weakness or fatigue, dizziness, headache, trouble breathing Muscle injury--unusual weakness or fatigue, muscle pain, dark yellow or brown urine, decrease in amount of urine Pain, tingling, or numbness in the hands or feet Sudden and severe headache, confusion, change in vision, seizures, which may be signs of posterior reversible encephalopathy syndrome (PRES) Unusual bruising or bleeding Side effects that usually do not require medical attention (report to your care team if they continue or are bothersome): Diarrhea Nausea Pain, redness, or swelling with sores inside the mouth or throat Unusual weakness or fatigue Vomiting This list may not describe all possible side effects. Call your doctor for medical advice about side effects. You may report side effects to FDA at 1-800-FDA-1088. Where should I keep my medication?   This medication is given in a hospital or clinic. It will not be stored at home. NOTE: This sheet is a summary. It may not cover all possible information. If you have questions about this medicine, talk to your doctor, pharmacist, or health care provider.  2023 Elsevier/Gold Standard (2007-09-30 00:00:00)  Leucovorin Injection What is this medication? LEUCOVORIN (loo koe VOR in) prevents side effects from certain medications, such as methotrexate. It works by increasing folate levels. This helps protect healthy cells in your body. It may also be used to treat anemia caused by low levels of folate. It can also be used with fluorouracil, a type of chemotherapy, to treat colorectal cancer. It works by increasing the effects of fluorouracil in the body. This medicine may be used for other  purposes; ask your health care provider or pharmacist if you have questions. What should I tell my care team before I take this medication? They need to know if you have any of these conditions: Anemia from low levels of vitamin B12 in the blood An unusual or allergic reaction to leucovorin, folic acid, other medications, foods, dyes, or preservatives Pregnant or trying to get pregnant Breastfeeding How should I use this medication? This medication is injected into a vein or a muscle. It is given by your care team in a hospital or clinic setting. Talk to your care team about the use of this medication in children. Special care may be needed. Overdosage: If you think you have taken too much of this medicine contact a poison control center or emergency room at once. NOTE: This medicine is only for you. Do not share this medicine with others. What if I miss a dose? Keep appointments for follow-up doses. It is important not to miss your dose. Call your care team if you are unable to keep an appointment. What may interact with this medication? Capecitabine Fluorouracil Phenobarbital Phenytoin Primidone Trimethoprim;sulfamethoxazole This list may not describe all possible interactions. Give your health care provider a list of all the medicines, herbs, non-prescription drugs, or dietary supplements you use. Also tell them if you smoke, drink alcohol, or use illegal drugs. Some items may interact with your medicine. What should I watch for while using this medication? Your condition will be monitored carefully while you are receiving this medication. This medication may increase the side effects of 5-fluorouracil. Tell your care team if you have diarrhea or mouth sores that do not get better or that get worse. What side effects may I notice from receiving this medication? Side effects that you should report to your care team as soon as possible: Allergic reactions--skin rash, itching, hives,  swelling of the face, lips, tongue, or throat This list may not describe all possible side effects. Call your doctor for medical advice about side effects. You may report side effects to FDA at 1-800-FDA-1088. Where should I keep my medication? This medication is given in a hospital or clinic. It will not be stored at home. NOTE: This sheet is a summary. It may not cover all possible information. If you have questions about this medicine, talk to your doctor, pharmacist, or health care provider.  2023 Elsevier/Gold Standard (2022-01-12 00:00:00)  Fluorouracil Injection What is this medication? FLUOROURACIL (flure oh YOOR a sil) treats some types of cancer. It works by slowing down the growth of cancer cells. This medicine may be used for other purposes; ask your health care provider or pharmacist if you have questions. COMMON BRAND NAME(S): Adrucil What should I tell   my care team before I take this medication? They need to know if you have any of these conditions: Blood disorders Dihydropyrimidine dehydrogenase (DPD) deficiency Infection, such as chickenpox, cold sores, herpes Kidney disease Liver disease Poor nutrition Recent or ongoing radiation therapy An unusual or allergic reaction to fluorouracil, other medications, foods, dyes, or preservatives If you or your partner are pregnant or trying to get pregnant Breast-feeding How should I use this medication? This medication is injected into a vein. It is administered by your care team in a hospital or clinic setting. Talk to your care team about the use of this medication in children. Special care may be needed. Overdosage: If you think you have taken too much of this medicine contact a poison control center or emergency room at once. NOTE: This medicine is only for you. Do not share this medicine with others. What if I miss a dose? Keep appointments for follow-up doses. It is important not to miss your dose. Call your care team if  you are unable to keep an appointment. What may interact with this medication? Do not take this medication with any of the following: Live virus vaccines This medication may also interact with the following: Medications that treat or prevent blood clots, such as warfarin, enoxaparin, dalteparin This list may not describe all possible interactions. Give your health care provider a list of all the medicines, herbs, non-prescription drugs, or dietary supplements you use. Also tell them if you smoke, drink alcohol, or use illegal drugs. Some items may interact with your medicine. What should I watch for while using this medication? Your condition will be monitored carefully while you are receiving this medication. This medication may make you feel generally unwell. This is not uncommon as chemotherapy can affect healthy cells as well as cancer cells. Report any side effects. Continue your course of treatment even though you feel ill unless your care team tells you to stop. In some cases, you may be given additional medications to help with side effects. Follow all directions for their use. This medication may increase your risk of getting an infection. Call your care team for advice if you get a fever, chills, sore throat, or other symptoms of a cold or flu. Do not treat yourself. Try to avoid being around people who are sick. This medication may increase your risk to bruise or bleed. Call your care team if you notice any unusual bleeding. Be careful brushing or flossing your teeth or using a toothpick because you may get an infection or bleed more easily. If you have any dental work done, tell your dentist you are receiving this medication. Avoid taking medications that contain aspirin, acetaminophen, ibuprofen, naproxen, or ketoprofen unless instructed by your care team. These medications may hide a fever. Do not treat diarrhea with over the counter products. Contact your care team if you have diarrhea  that lasts more than 2 days or if it is severe and watery. This medication can make you more sensitive to the sun. Keep out of the sun. If you cannot avoid being in the sun, wear protective clothing and sunscreen. Do not use sun lamps, tanning beds, or tanning booths. Talk to your care team if you or your partner wish to become pregnant or think you might be pregnant. This medication can cause serious birth defects if taken during pregnancy and for 3 months after the last dose. A reliable form of contraception is recommended while taking this medication and for 3 months after  the last dose. Talk to your care team about effective forms of contraception. Do not father a child while taking this medication and for 3 months after the last dose. Use a condom while having sex during this time period. Do not breastfeed while taking this medication. This medication may cause infertility. Talk to your care team if you are concerned about your fertility. What side effects may I notice from receiving this medication? Side effects that you should report to your care team as soon as possible: Allergic reactions--skin rash, itching, hives, swelling of the face, lips, tongue, or throat Heart attack--pain or tightness in the chest, shoulders, arms, or jaw, nausea, shortness of breath, cold or clammy skin, feeling faint or lightheaded Heart failure--shortness of breath, swelling of the ankles, feet, or hands, sudden weight gain, unusual weakness or fatigue Heart rhythm changes--fast or irregular heartbeat, dizziness, feeling faint or lightheaded, chest pain, trouble breathing High ammonia level--unusual weakness or fatigue, confusion, loss of appetite, nausea, vomiting, seizures Infection--fever, chills, cough, sore throat, wounds that don't heal, pain or trouble when passing urine, general feeling of discomfort or being unwell Low red blood cell level--unusual weakness or fatigue, dizziness, headache, trouble  breathing Pain, tingling, or numbness in the hands or feet, muscle weakness, change in vision, confusion or trouble speaking, loss of balance or coordination, trouble walking, seizures Redness, swelling, and blistering of the skin over hands and feet Severe or prolonged diarrhea Unusual bruising or bleeding Side effects that usually do not require medical attention (report to your care team if they continue or are bothersome): Dry skin Headache Increased tears Nausea Pain, redness, or swelling with sores inside the mouth or throat Sensitivity to light Vomiting This list may not describe all possible side effects. Call your doctor for medical advice about side effects. You may report side effects to FDA at 1-800-FDA-1088. Where should I keep my medication? This medication is given in a hospital or clinic. It will not be stored at home. NOTE: This sheet is a summary. It may not cover all possible information. If you have questions about this medicine, talk to your doctor, pharmacist, or health care provider.  2023 Elsevier/Gold Standard (2021-12-08 00:00:00)  

## 2022-08-11 NOTE — Progress Notes (Signed)
77 year old female diagnosed with met Colon Cancer. She is receiving FOLFOX and is followed by Dr. Benay Spice.  PMH includes Breast cancer, HTN, Anemia, hypercholesterolemia, Vitamin D deficiency, and radiation.  Medications include compazine.  Labs include K 3.3 on December 19.  Height: 66 inches Weight: 151 pounds UBW: 150 pounds per patient Documented weight of 184 pounds 15 oz on November 5 and 175 pounds November 2 in hospital bed. BMI: 24. 38.  Patient reports decreased appetite. She has an aversion to food smells. She has occasional nausea and vomiting. She has constipation but it was relieved with Miralax. Patient states she just wants to have a normal bowel movement. She no longer likes coffee. She tried Strawberry Ensure Complete today but does not like it. She is not interested in other nutrition drinks.   Nutrition Diagnosis: Food and Nutrition Related Knowledge Deficit related to metastatic cancer and associated treatments as evidenced by no prior need for nutrition related information.  Intervention: Educated on importance of small, frequent meals and snacks. Encouraged high protein foods. Reviewed strategies for improving constipation including increasing fruits and vegetables and water intake. Provided nutrition fact sheets. Questions answered. Contact information given.  Monitoring, Evaluation, Goals: Patient will tolerate adequate calories and protein to minimize wt loss.  Next Visit: Wednesday, January 3 during infusion.

## 2022-08-12 ENCOUNTER — Encounter: Payer: Self-pay | Admitting: Oncology

## 2022-08-13 ENCOUNTER — Other Ambulatory Visit (HOSPITAL_BASED_OUTPATIENT_CLINIC_OR_DEPARTMENT_OTHER): Payer: Self-pay

## 2022-08-13 ENCOUNTER — Inpatient Hospital Stay: Payer: Medicare Other

## 2022-08-13 VITALS — BP 113/64 | HR 76 | Temp 98.3°F | Resp 16

## 2022-08-13 DIAGNOSIS — Z5111 Encounter for antineoplastic chemotherapy: Secondary | ICD-10-CM | POA: Diagnosis not present

## 2022-08-13 DIAGNOSIS — C182 Malignant neoplasm of ascending colon: Secondary | ICD-10-CM

## 2022-08-13 MED ORDER — PEGFILGRASTIM-CBQV 6 MG/0.6ML ~~LOC~~ SOSY
6.0000 mg | PREFILLED_SYRINGE | Freq: Once | SUBCUTANEOUS | Status: AC
  Administered 2022-08-13: 6 mg via SUBCUTANEOUS
  Filled 2022-08-13: qty 0.6

## 2022-08-13 MED ORDER — HEPARIN SOD (PORK) LOCK FLUSH 100 UNIT/ML IV SOLN
500.0000 [IU] | Freq: Once | INTRAVENOUS | Status: AC | PRN
  Administered 2022-08-13: 500 [IU]

## 2022-08-13 MED ORDER — SODIUM CHLORIDE 0.9% FLUSH
10.0000 mL | INTRAVENOUS | Status: DC | PRN
  Administered 2022-08-13: 10 mL

## 2022-08-23 ENCOUNTER — Encounter: Payer: Self-pay | Admitting: Oncology

## 2022-08-23 ENCOUNTER — Other Ambulatory Visit: Payer: Self-pay | Admitting: Oncology

## 2022-08-24 ENCOUNTER — Telehealth: Payer: Self-pay

## 2022-08-24 ENCOUNTER — Inpatient Hospital Stay: Payer: Medicare Other

## 2022-08-24 ENCOUNTER — Other Ambulatory Visit (HOSPITAL_BASED_OUTPATIENT_CLINIC_OR_DEPARTMENT_OTHER): Payer: Self-pay

## 2022-08-24 ENCOUNTER — Inpatient Hospital Stay: Payer: Medicare Other | Attending: Oncology

## 2022-08-24 ENCOUNTER — Other Ambulatory Visit (HOSPITAL_COMMUNITY): Payer: Self-pay

## 2022-08-24 ENCOUNTER — Other Ambulatory Visit: Payer: Self-pay | Admitting: Nurse Practitioner

## 2022-08-24 ENCOUNTER — Encounter: Payer: Self-pay | Admitting: Nurse Practitioner

## 2022-08-24 ENCOUNTER — Encounter: Payer: Self-pay | Admitting: Oncology

## 2022-08-24 ENCOUNTER — Inpatient Hospital Stay (HOSPITAL_BASED_OUTPATIENT_CLINIC_OR_DEPARTMENT_OTHER): Payer: Medicare Other | Admitting: Nurse Practitioner

## 2022-08-24 ENCOUNTER — Other Ambulatory Visit: Payer: Self-pay

## 2022-08-24 VITALS — BP 103/70 | HR 100 | Temp 98.1°F | Resp 18 | Ht 66.0 in | Wt 144.0 lb

## 2022-08-24 DIAGNOSIS — C182 Malignant neoplasm of ascending colon: Secondary | ICD-10-CM | POA: Diagnosis not present

## 2022-08-24 DIAGNOSIS — Z79899 Other long term (current) drug therapy: Secondary | ICD-10-CM | POA: Diagnosis not present

## 2022-08-24 DIAGNOSIS — C180 Malignant neoplasm of cecum: Secondary | ICD-10-CM | POA: Insufficient documentation

## 2022-08-24 DIAGNOSIS — R0981 Nasal congestion: Secondary | ICD-10-CM | POA: Diagnosis not present

## 2022-08-24 DIAGNOSIS — K3580 Unspecified acute appendicitis: Secondary | ICD-10-CM | POA: Insufficient documentation

## 2022-08-24 DIAGNOSIS — Z853 Personal history of malignant neoplasm of breast: Secondary | ICD-10-CM | POA: Diagnosis not present

## 2022-08-24 DIAGNOSIS — Z5189 Encounter for other specified aftercare: Secondary | ICD-10-CM | POA: Diagnosis not present

## 2022-08-24 DIAGNOSIS — Z5111 Encounter for antineoplastic chemotherapy: Secondary | ICD-10-CM | POA: Insufficient documentation

## 2022-08-24 DIAGNOSIS — Z95828 Presence of other vascular implants and grafts: Secondary | ICD-10-CM

## 2022-08-24 DIAGNOSIS — R630 Anorexia: Secondary | ICD-10-CM | POA: Insufficient documentation

## 2022-08-24 DIAGNOSIS — I10 Essential (primary) hypertension: Secondary | ICD-10-CM | POA: Insufficient documentation

## 2022-08-24 LAB — CBC WITH DIFFERENTIAL (CANCER CENTER ONLY)
Abs Immature Granulocytes: 0.6 10*3/uL — ABNORMAL HIGH (ref 0.00–0.07)
Band Neutrophils: 3 %
Basophils Absolute: 0 10*3/uL (ref 0.0–0.1)
Basophils Relative: 0 %
Eosinophils Absolute: 0 10*3/uL (ref 0.0–0.5)
Eosinophils Relative: 0 %
HCT: 32.8 % — ABNORMAL LOW (ref 36.0–46.0)
Hemoglobin: 10.2 g/dL — ABNORMAL LOW (ref 12.0–15.0)
Lymphocytes Relative: 11 %
Lymphs Abs: 1.5 10*3/uL (ref 0.7–4.0)
MCH: 25.3 pg — ABNORMAL LOW (ref 26.0–34.0)
MCHC: 31.1 g/dL (ref 30.0–36.0)
MCV: 81.4 fL (ref 80.0–100.0)
Metamyelocytes Relative: 1 %
Monocytes Absolute: 1.1 10*3/uL — ABNORMAL HIGH (ref 0.1–1.0)
Monocytes Relative: 8 %
Myelocytes: 3 %
Neutro Abs: 10.8 10*3/uL — ABNORMAL HIGH (ref 1.7–7.7)
Neutrophils Relative %: 74 %
Platelet Count: 156 10*3/uL (ref 150–400)
RBC: 4.03 MIL/uL (ref 3.87–5.11)
RDW: 16.7 % — ABNORMAL HIGH (ref 11.5–15.5)
Smear Review: NORMAL
WBC Count: 14 10*3/uL — ABNORMAL HIGH (ref 4.0–10.5)
nRBC: 0 % (ref 0.0–0.2)

## 2022-08-24 LAB — MAGNESIUM: Magnesium: 1.7 mg/dL (ref 1.7–2.4)

## 2022-08-24 LAB — CMP (CANCER CENTER ONLY)
ALT: 8 U/L (ref 0–44)
AST: 24 U/L (ref 15–41)
Albumin: 3.9 g/dL (ref 3.5–5.0)
Alkaline Phosphatase: 145 U/L — ABNORMAL HIGH (ref 38–126)
Anion gap: 17 — ABNORMAL HIGH (ref 5–15)
BUN: 15 mg/dL (ref 8–23)
CO2: 25 mmol/L (ref 22–32)
Calcium: 9.2 mg/dL (ref 8.9–10.3)
Chloride: 96 mmol/L — ABNORMAL LOW (ref 98–111)
Creatinine: 0.6 mg/dL (ref 0.44–1.00)
GFR, Estimated: >60 mL/min (ref >60)
Glucose, Bld: 95 mg/dL (ref 70–99)
Potassium: 2.9 mmol/L — ABNORMAL LOW (ref 3.5–5.1)
Sodium: 138 mmol/L (ref 135–145)
Total Bilirubin: 0.8 mg/dL (ref 0.3–1.2)
Total Protein: 6.9 g/dL (ref 6.5–8.1)

## 2022-08-24 LAB — CEA (ACCESS): CEA (CHCC): 1312 ng/mL — ABNORMAL HIGH (ref 0.00–5.00)

## 2022-08-24 MED ORDER — POTASSIUM CHLORIDE CRYS ER 20 MEQ PO TBCR
EXTENDED_RELEASE_TABLET | ORAL | 1 refills | Status: DC
  Filled 2022-08-24 – 2022-09-22 (×2): qty 33, 30d supply, fill #0

## 2022-08-24 MED ORDER — PROCHLORPERAZINE MALEATE 10 MG PO TABS
5.0000 mg | ORAL_TABLET | Freq: Four times a day (QID) | ORAL | 2 refills | Status: DC | PRN
  Filled 2022-08-24 – 2022-08-26 (×2): qty 30, 8d supply, fill #0
  Filled 2022-09-03: qty 30, 8d supply, fill #1

## 2022-08-24 MED ORDER — HEPARIN SOD (PORK) LOCK FLUSH 100 UNIT/ML IV SOLN
500.0000 [IU] | Freq: Once | INTRAVENOUS | Status: AC
  Administered 2022-08-24: 500 [IU] via INTRAVENOUS

## 2022-08-24 MED ORDER — PREDNISONE 10 MG PO TABS
10.0000 mg | ORAL_TABLET | Freq: Every day | ORAL | 1 refills | Status: DC
  Filled 2022-08-24 – 2022-08-26 (×2): qty 30, 30d supply, fill #0

## 2022-08-24 MED ORDER — SODIUM CHLORIDE 0.9% FLUSH
10.0000 mL | INTRAVENOUS | Status: DC | PRN
  Administered 2022-08-24: 10 mL via INTRAVENOUS

## 2022-08-24 NOTE — Telephone Encounter (Signed)
Call Lab Gerrie Nordmann will add magnesium.

## 2022-08-24 NOTE — Patient Instructions (Signed)

## 2022-08-24 NOTE — Progress Notes (Signed)
Patient seen by Ned Card NP today  Vitals are within treatment parameters.  Labs reviewed by Ned Card NP and are not all within treatment parameters. K+ is  2.9. Today a  magnesium lab was add and  a new prescription  K+  was sent to pharmacy  Per physician team, patient is ready for treatment and there are NO modifications to the treatment plan.It is okay to treat on 08/25/22.

## 2022-08-24 NOTE — Progress Notes (Signed)
  [facility] OFFICE PROGRESS NOTE   Diagnosis: Colon cancer  INTERVAL HISTORY:   Christina [name] returns as scheduled.  She completed cycle 2 FOLFOX 08/11/2022.  She continues to have mild intermittent nausea, not clearly related to chemotherapy.  No mouth sores.  No diarrhea.  She experience cold sensitivity.  No persistent neuropathy symptoms.  Appetite is poor.  She relates this to certain "smells".  Objective:  Vital signs in last 24 hours:  Blood pressure 103/70, pulse 100, temperature 98.1 F (36.7 C), temperature source Oral, resp. rate 18, height _0  (1.676 m), weight 144 lb (65.3 kg), SpO2 100 %.    HEENT: No thrush or ulcers. Resp: Lungs clear bilaterally. Cardio: Regular rate and rhythm. GI: No hepatomegaly. Vascular: No leg edema. Skin: Palms without erythema. Port-A-Cath without erythema.   Lab Results:  Lab Results  Component Value Date   WBC 3.1 (L) 08/10/2022   HGB 9.2 (L) 08/10/2022   HCT 29.7 (L) 08/10/2022   MCV 81.4 08/10/2022   PLT 254 08/10/2022   NEUTROABS 1.5 (L) 08/10/2022    Imaging:  No results found.  Medications: I have reviewed the patient's current medications.  Assessment/Plan: Metastatic colon cancer CT abdomen/pelvis 06/22/2022, interval appendectomy, extensive interstitial and extraluminal gas surrounding the surgical staples at the resection margin, extensive cecal wall and terminal ileum thickening, pathologic adenopathy in the ileocolic lymph nodes, interval development of by lobar hypoenhancing hepatic masses Ultrasound-guided biopsy of left liver mass 06/28/2022-static adenocarcinoma, CDX2 and CK20 positive; microsatellite stable, tumor mutation burden 4, K-ras G13D Colonoscopy 07/19/2022-malignant tumor in the cecum-moderately differentiated adenocarcinoma with mucinous features and background of high-grade dysplasia, ileocecal valve polyp-negative for malignancy Cycle 1 FOLFOX 07/28/2022 Cycle 2 FOLFOX 08/11/2022,  Udenyca Cycle 3 FOLFOX 08/25/2022, Udenyca Low-grade appendiceal mucinous neoplasm 03/03/2022 Appendectomy with neoplastic changes in the epithelium at the appendiceal margin of resection, gangrenous appendicitis, no mucinous deposits on appendiceal serosa, no neoplastic infiltration of the submucosa and muscularis 3.  Right breast cancer 08/28/2015-pT2pN0, stage IIa invasive ductal carcinoma, grade 1, HER2 negative, ER positive, PR positive-adjuvant radiation and anastrozole 12/29/2015-May 2022 4.  Left breast cancer 08/28/2015,pT1cpN0, stage Ia invasive ductal carcinoma, grade 1, HER2 negative, ER positive, PR positive-adjuvant radiation, anastrozole 12/29/2015-May 2022   5.  Hypertension   6.  Upper abdominal pain-likely secondary to metastatic disease involving the liver and the cecal tumor   7.  Port-A-Cath placement 07/23/2022    Disposition: Christina [name] appears stable.  She has completed 2 cycles of FOLFOX.  Plan to proceed with cycle 3 as scheduled 08/25/2022.  CBC and chemistry panel are pending.  She will try prednisone 10 mg daily for appetite stimulation.  We reviewed potential side effects.  She agrees to proceed.  She will return for follow-up in 2 weeks.  We are available to see her sooner if needed.    Ned Card ANP/GNP-BC   08/24/2022  2:29 PM

## 2022-08-25 ENCOUNTER — Inpatient Hospital Stay: Payer: Medicare Other

## 2022-08-25 ENCOUNTER — Other Ambulatory Visit (HOSPITAL_BASED_OUTPATIENT_CLINIC_OR_DEPARTMENT_OTHER): Payer: Self-pay

## 2022-08-25 ENCOUNTER — Inpatient Hospital Stay: Payer: Medicare Other | Admitting: Nutrition

## 2022-08-26 ENCOUNTER — Other Ambulatory Visit: Payer: Self-pay

## 2022-08-26 ENCOUNTER — Other Ambulatory Visit (HOSPITAL_COMMUNITY): Payer: Self-pay

## 2022-08-26 ENCOUNTER — Other Ambulatory Visit (HOSPITAL_BASED_OUTPATIENT_CLINIC_OR_DEPARTMENT_OTHER): Payer: Self-pay

## 2022-08-26 ENCOUNTER — Inpatient Hospital Stay: Payer: Medicare Other

## 2022-08-26 VITALS — BP 129/75 | HR 85 | Temp 98.2°F | Resp 18

## 2022-08-26 DIAGNOSIS — Z5111 Encounter for antineoplastic chemotherapy: Secondary | ICD-10-CM | POA: Diagnosis not present

## 2022-08-26 DIAGNOSIS — C182 Malignant neoplasm of ascending colon: Secondary | ICD-10-CM

## 2022-08-26 MED ORDER — LEUCOVORIN CALCIUM INJECTION 350 MG
400.0000 mg/m2 | Freq: Once | INTRAVENOUS | Status: AC
  Administered 2022-08-26: 728 mg via INTRAVENOUS
  Filled 2022-08-26: qty 36.4

## 2022-08-26 MED ORDER — DEXTROSE 5 % IV SOLN: Freq: Once | INTRAVENOUS | Status: AC

## 2022-08-26 MED ORDER — PROCHLORPERAZINE MALEATE 10 MG PO TABS
10.0000 mg | ORAL_TABLET | Freq: Once | ORAL | Status: AC
  Administered 2022-08-26: 10 mg via ORAL
  Filled 2022-08-26: qty 1

## 2022-08-26 MED ORDER — SODIUM CHLORIDE 0.9 % IV SOLN
150.0000 mg | Freq: Once | INTRAVENOUS | Status: AC
  Administered 2022-08-26: 150 mg via INTRAVENOUS
  Filled 2022-08-26: qty 150

## 2022-08-26 MED ORDER — SODIUM CHLORIDE 0.9 % IV SOLN
10.0000 mg | Freq: Once | INTRAVENOUS | Status: AC
  Administered 2022-08-26: 10 mg via INTRAVENOUS
  Filled 2022-08-26: qty 10

## 2022-08-26 MED ORDER — DEXTROSE 5 % IV SOLN: Freq: Once | INTRAVENOUS | Status: DC

## 2022-08-26 MED ORDER — OXALIPLATIN CHEMO INJECTION 100 MG/20ML
83.0000 mg/m2 | Freq: Once | INTRAVENOUS | Status: AC
  Administered 2022-08-26: 150 mg via INTRAVENOUS
  Filled 2022-08-26: qty 10

## 2022-08-26 MED ORDER — FLUOROURACIL CHEMO INJECTION 2.5 GM/50ML
400.0000 mg/m2 | Freq: Once | INTRAVENOUS | Status: AC
  Administered 2022-08-26: 750 mg via INTRAVENOUS
  Filled 2022-08-26: qty 15

## 2022-08-26 MED ORDER — SODIUM CHLORIDE 0.9 % IV SOLN
2400.0000 mg/m2 | INTRAVENOUS | Status: DC
  Administered 2022-08-26: 4350 mg via INTRAVENOUS
  Filled 2022-08-26: qty 87

## 2022-08-26 NOTE — Patient Instructions (Addendum)
[facility]   The chemotherapy medication bag should finish at 46 hours, 96 hours, or 7 days. For example, if your pump is scheduled for 46 hours and it was put on at 4:00 p.m., it should finish at 2:00 p.m. the day it is scheduled to come off regardless of your appointment time.     Estimated time to finish is between 11:30 and 12:00 Saturday, August 28, 2022.  Go to [facility].   If the display on your pump reads "Low Volume" and it is beeping, take the batteries out of the pump and come to the cancer center for it to be taken off.   If the pump alarms go off prior to the pump reading "Low Volume" then call [phone] and someone can assist you.  If the plunger comes out and the chemotherapy medication is leaking out, please use your home chemo spill kit to clean up the spill. Do NOT use paper towels or other household products.  If you have problems or questions regarding your pump, please call either [phone] (24 hours a day) or the cancer center Monday-Friday 8:00 a.m.- 4:30 p.m. at the clinic number and we will assist you. If you are unable to get assistance, then go to the nearest Emergency Department and ask the staff to contact the IV team for assistance.  Discharge Instructions: Thank you for choosing [facility] to provide your oncology and hematology care.   If you have a lab appointment with the [facility], please go directly to the [facility] and check in at the registration area.   Wear comfortable clothing and clothing appropriate for easy access to any Portacath or PICC line.   We strive to give you quality time with your provider. You may need to reschedule your appointment if you arrive late (15 or more minutes).  Arriving late affects you and other patients whose appointments are after yours.  Also, if you miss three or more appointments without notifying the office, you may be dismissed from the clinic at  the provider's discretion.      For prescription refill requests, have your pharmacy contact our office and allow 72 hours for refills to be completed.    Today you received the following chemotherapy and/or immunotherapy agents Oxaliplatin, Leucovorin, Fluorouracil.      To help prevent nausea and vomiting after your treatment, we encourage you to take your nausea medication as directed.  BELOW ARE SYMPTOMS THAT SHOULD BE REPORTED IMMEDIATELY: *FEVER GREATER THAN 100.4 F (38 C) OR HIGHER *CHILLS OR SWEATING *NAUSEA AND VOMITING THAT IS NOT CONTROLLED WITH YOUR NAUSEA MEDICATION *UNUSUAL SHORTNESS OF BREATH *UNUSUAL BRUISING OR BLEEDING *URINARY PROBLEMS (pain or burning when urinating, or frequent urination) *BOWEL PROBLEMS (unusual diarrhea, constipation, pain near the anus) TENDERNESS IN MOUTH AND THROAT WITH OR WITHOUT PRESENCE OF ULCERS (sore throat, sores in mouth, or a toothache) UNUSUAL RASH, SWELLING OR PAIN  UNUSUAL VAGINAL DISCHARGE OR ITCHING   Items with * indicate a potential emergency and should be followed up as soon as possible or go to the Emergency Department if any problems should occur.  Please show the CHEMOTHERAPY ALERT CARD or IMMUNOTHERAPY ALERT CARD at check-in to the Emergency Department and triage nurse.  Should you have questions after your visit or need to cancel or reschedule your appointment, please contact [facility]  Dept: [phone]  and follow the prompts.  Office hours are 8:00 a.m.  to 4:30 p.m. Monday - Friday. Please note that voicemails left after 4:00 p.m. may not be returned until the following business day.  We are closed weekends and major holidays. You have access to a nurse at all times for urgent questions. Please call the main number to the clinic Dept: [phone] and follow the prompts.   For any non-urgent questions, you may also contact your provider using MyChart. We now offer e-Visits for anyone 69 and  older to request care online for non-urgent symptoms. For details visit [email or web address].   Also download the MyChart app! Go to the app store, search "MyChart", open the app, select [facility], and log in with your MyChart username and password.  Oxaliplatin Injection What is this medication? OXALIPLATIN (ox AL i PLA tin) treats some types of cancer. It works by slowing down the growth of cancer cells. This medicine may be used for other purposes; ask your health care provider or pharmacist if you have questions. COMMON BRAND NAME(S): Eloxatin What should I tell my care team before I take this medication? They need to know if you have any of these conditions: Heart disease History of irregular heartbeat or rhythm Liver disease Low blood cell levels (white cells, red cells, and platelets) Lung or breathing disease, such as asthma Take medications that treat or prevent blood clots Tingling of the fingers, toes, or other nerve disorder An unusual or allergic reaction to oxaliplatin, other medications, foods, dyes, or preservatives If you or your partner are pregnant or trying to get pregnant Breast-feeding How should I use this medication? This medication is injected into a vein. It is given by your care team in a hospital or clinic setting. Talk to your care team about the use of this medication in children. Special care may be needed. Overdosage: If you think you have taken too much of this medicine contact a poison control center or emergency room at once. NOTE: This medicine is only for you. Do not share this medicine with others. What if I miss a dose? Keep appointments for follow-up doses. It is important not to miss a dose. Call your care team if you are unable to keep an appointment. What may interact with this medication? Do not take this medication with any of the following: Cisapride Dronedarone Pimozide Thioridazine This medication may also interact with the  following: Aspirin and aspirin-like medications Certain medications that treat or prevent blood clots, such as warfarin, apixaban, dabigatran, and rivaroxaban Cisplatin Cyclosporine Diuretics Medications for infection, such as acyclovir, adefovir, amphotericin B, bacitracin, cidofovir, foscarnet, ganciclovir, gentamicin, pentamidine, vancomycin NSAIDs, medications for pain and inflammation, such as ibuprofen or naproxen Other medications that cause heart rhythm changes Pamidronate Zoledronic acid This list may not describe all possible interactions. Give your health care provider a list of all the medicines, herbs, non-prescription drugs, or dietary supplements you use. Also tell them if you smoke, drink alcohol, or use illegal drugs. Some items may interact with your medicine. What should I watch for while using this medication? Your condition will be monitored carefully while you are receiving this medication. You may need blood work while taking this medication. This medication may make you feel generally unwell. This is not uncommon as chemotherapy can affect healthy cells as well as cancer cells. Report any side effects. Continue your course of treatment even though you feel ill unless your care team tells you to stop. This medication may increase your risk of getting an infection. Call your care team for  advice if you get a fever, chills, sore throat, or other symptoms of a cold or flu. Do not treat yourself. Try to avoid being around people who are sick. Avoid taking medications that contain aspirin, acetaminophen, ibuprofen, naproxen, or ketoprofen unless instructed by your care team. These medications may hide a fever. Be careful brushing or flossing your teeth or using a toothpick because you may get an infection or bleed more easily. If you have any dental work done, tell your dentist you are receiving this medication. This medication can make you more sensitive to cold. Do not drink  cold drinks or use ice. Cover exposed skin before coming in contact with cold temperatures or cold objects. When out in cold weather wear warm clothing and cover your mouth and nose to warm the air that goes into your lungs. Tell your care team if you get sensitive to the cold. Talk to your care team if you or your partner are pregnant or think either of you might be pregnant. This medication can cause serious birth defects if taken during pregnancy and for 9 months after the last dose. A negative pregnancy test is required before starting this medication. A reliable form of contraception is recommended while taking this medication and for 9 months after the last dose. Talk to your care team about effective forms of contraception. Do not father a child while taking this medication and for 6 months after the last dose. Use a condom while having sex during this time period. Do not breastfeed while taking this medication and for 3 months after the last dose. This medication may cause infertility. Talk to your care team if you are concerned about your fertility. What side effects may I notice from receiving this medication? Side effects that you should report to your care team as soon as possible: Allergic reactions--skin rash, itching, hives, swelling of the face, lips, tongue, or throat Bleeding--bloody or black, tar-like stools, vomiting blood or brown material that looks like coffee grounds, red or dark brown urine, small red or purple spots on skin, unusual bruising or bleeding Dry cough, shortness of breath or trouble breathing Heart rhythm changes--fast or irregular heartbeat, dizziness, feeling faint or lightheaded, chest pain, trouble breathing Infection--fever, chills, cough, sore throat, wounds that don't heal, pain or trouble when passing urine, general feeling of discomfort or being unwell Liver injury--right upper belly pain, loss of appetite, nausea, light-colored stool, dark yellow or brown  urine, yellowing skin or eyes, unusual weakness or fatigue Low red blood cell level--unusual weakness or fatigue, dizziness, headache, trouble breathing Muscle injury--unusual weakness or fatigue, muscle pain, dark yellow or brown urine, decrease in amount of urine Pain, tingling, or numbness in the hands or feet Sudden and severe headache, confusion, change in vision, seizures, which may be signs of posterior reversible encephalopathy syndrome (PRES) Unusual bruising or bleeding Side effects that usually do not require medical attention (report to your care team if they continue or are bothersome): Diarrhea Nausea Pain, redness, or swelling with sores inside the mouth or throat Unusual weakness or fatigue Vomiting This list may not describe all possible side effects. Call your doctor for medical advice about side effects. You may report side effects to FDA at 1-800-FDA-1088. Where should I keep my medication? This medication is given in a hospital or clinic. It will not be stored at home. NOTE: This sheet is a summary. It may not cover all possible information. If you have questions about this medicine, talk to your  doctor, pharmacist, or health care provider.  2023 Elsevier/Gold Standard (2007-09-30 00:00:00)  Leucovorin Injection What is this medication? LEUCOVORIN (loo koe VOR in) prevents side effects from certain medications, such as methotrexate. It works by increasing folate levels. This helps protect healthy cells in your body. It may also be used to treat anemia caused by low levels of folate. It can also be used with fluorouracil, a type of chemotherapy, to treat colorectal cancer. It works by increasing the effects of fluorouracil in the body. This medicine may be used for other purposes; ask your health care provider or pharmacist if you have questions. What should I tell my care team before I take this medication? They need to know if you have any of these conditions: Anemia  from low levels of vitamin B12 in the blood An unusual or allergic reaction to leucovorin, folic acid, other medications, foods, dyes, or preservatives Pregnant or trying to get pregnant Breastfeeding How should I use this medication? This medication is injected into a vein or a muscle. It is given by your care team in a hospital or clinic setting. Talk to your care team about the use of this medication in children. Special care may be needed. Overdosage: If you think you have taken too much of this medicine contact a poison control center or emergency room at once. NOTE: This medicine is only for you. Do not share this medicine with others. What if I miss a dose? Keep appointments for follow-up doses. It is important not to miss your dose. Call your care team if you are unable to keep an appointment. What may interact with this medication? Capecitabine Fluorouracil Phenobarbital Phenytoin Primidone Trimethoprim;sulfamethoxazole This list may not describe all possible interactions. Give your health care provider a list of all the medicines, herbs, non-prescription drugs, or dietary supplements you use. Also tell them if you smoke, drink alcohol, or use illegal drugs. Some items may interact with your medicine. What should I watch for while using this medication? Your condition will be monitored carefully while you are receiving this medication. This medication may increase the side effects of 5-fluorouracil. Tell your care team if you have diarrhea or mouth sores that do not get better or that get worse. What side effects may I notice from receiving this medication? Side effects that you should report to your care team as soon as possible: Allergic reactions--skin rash, itching, hives, swelling of the face, lips, tongue, or throat This list may not describe all possible side effects. Call your doctor for medical advice about side effects. You may report side effects to FDA at  1-800-FDA-1088. Where should I keep my medication? This medication is given in a hospital or clinic. It will not be stored at home. NOTE: This sheet is a summary. It may not cover all possible information. If you have questions about this medicine, talk to your doctor, pharmacist, or health care provider.  2023 Elsevier/Gold Standard (2022-01-12 00:00:00)  Fluorouracil Injection What is this medication? FLUOROURACIL (flure oh YOOR a sil) treats some types of cancer. It works by slowing down the growth of cancer cells. This medicine may be used for other purposes; ask your health care provider or pharmacist if you have questions. COMMON BRAND NAME(S): Adrucil What should I tell my care team before I take this medication? They need to know if you have any of these conditions: Blood disorders Dihydropyrimidine dehydrogenase (DPD) deficiency Infection, such as chickenpox, cold sores, herpes Kidney disease Liver disease Poor nutrition Recent  or ongoing radiation therapy An unusual or allergic reaction to fluorouracil, other medications, foods, dyes, or preservatives If you or your partner are pregnant or trying to get pregnant Breast-feeding How should I use this medication? This medication is injected into a vein. It is administered by your care team in a hospital or clinic setting. Talk to your care team about the use of this medication in children. Special care may be needed. Overdosage: If you think you have taken too much of this medicine contact a poison control center or emergency room at once. NOTE: This medicine is only for you. Do not share this medicine with others. What if I miss a dose? Keep appointments for follow-up doses. It is important not to miss your dose. Call your care team if you are unable to keep an appointment. What may interact with this medication? Do not take this medication with any of the following: Live virus vaccines This medication may also interact with  the following: Medications that treat or prevent blood clots, such as warfarin, enoxaparin, dalteparin This list may not describe all possible interactions. Give your health care provider a list of all the medicines, herbs, non-prescription drugs, or dietary supplements you use. Also tell them if you smoke, drink alcohol, or use illegal drugs. Some items may interact with your medicine. What should I watch for while using this medication? Your condition will be monitored carefully while you are receiving this medication. This medication may make you feel generally unwell. This is not uncommon as chemotherapy can affect healthy cells as well as cancer cells. Report any side effects. Continue your course of treatment even though you feel ill unless your care team tells you to stop. In some cases, you may be given additional medications to help with side effects. Follow all directions for their use. This medication may increase your risk of getting an infection. Call your care team for advice if you get a fever, chills, sore throat, or other symptoms of a cold or flu. Do not treat yourself. Try to avoid being around people who are sick. This medication may increase your risk to bruise or bleed. Call your care team if you notice any unusual bleeding. Be careful brushing or flossing your teeth or using a toothpick because you may get an infection or bleed more easily. If you have any dental work done, tell your dentist you are receiving this medication. Avoid taking medications that contain aspirin, acetaminophen, ibuprofen, naproxen, or ketoprofen unless instructed by your care team. These medications may hide a fever. Do not treat diarrhea with over the counter products. Contact your care team if you have diarrhea that lasts more than 2 days or if it is severe and watery. This medication can make you more sensitive to the sun. Keep out of the sun. If you cannot avoid being in the sun, wear protective  clothing and sunscreen. Do not use sun lamps, tanning beds, or tanning booths. Talk to your care team if you or your partner wish to become pregnant or think you might be pregnant. This medication can cause serious birth defects if taken during pregnancy and for 3 months after the last dose. A reliable form of contraception is recommended while taking this medication and for 3 months after the last dose. Talk to your care team about effective forms of contraception. Do not father a child while taking this medication and for 3 months after the last dose. Use a condom while having sex during this time  period. Do not breastfeed while taking this medication. This medication may cause infertility. Talk to your care team if you are concerned about your fertility. What side effects may I notice from receiving this medication? Side effects that you should report to your care team as soon as possible: Allergic reactions--skin rash, itching, hives, swelling of the face, lips, tongue, or throat Heart attack--pain or tightness in the chest, shoulders, arms, or jaw, nausea, shortness of breath, cold or clammy skin, feeling faint or lightheaded Heart failure--shortness of breath, swelling of the ankles, feet, or hands, sudden weight gain, unusual weakness or fatigue Heart rhythm changes--fast or irregular heartbeat, dizziness, feeling faint or lightheaded, chest pain, trouble breathing High ammonia level--unusual weakness or fatigue, confusion, loss of appetite, nausea, vomiting, seizures Infection--fever, chills, cough, sore throat, wounds that don't heal, pain or trouble when passing urine, general feeling of discomfort or being unwell Low red blood cell level--unusual weakness or fatigue, dizziness, headache, trouble breathing Pain, tingling, or numbness in the hands or feet, muscle weakness, change in vision, confusion or trouble speaking, loss of balance or coordination, trouble walking, seizures Redness,  swelling, and blistering of the skin over hands and feet Severe or prolonged diarrhea Unusual bruising or bleeding Side effects that usually do not require medical attention (report to your care team if they continue or are bothersome): Dry skin Headache Increased tears Nausea Pain, redness, or swelling with sores inside the mouth or throat Sensitivity to light Vomiting This list may not describe all possible side effects. Call your doctor for medical advice about side effects. You may report side effects to FDA at 1-800-FDA-1088. Where should I keep my medication? This medication is given in a hospital or clinic. It will not be stored at home. NOTE: This sheet is a summary. It may not cover all possible information. If you have questions about this medicine, talk to your doctor, pharmacist, or health care provider.  2023 Elsevier/Gold Standard (2021-12-08 00:00:00)

## 2022-08-27 ENCOUNTER — Inpatient Hospital Stay: Payer: Medicare Other

## 2022-08-27 ENCOUNTER — Other Ambulatory Visit (HOSPITAL_BASED_OUTPATIENT_CLINIC_OR_DEPARTMENT_OTHER): Payer: Self-pay

## 2022-08-27 ENCOUNTER — Other Ambulatory Visit (HOSPITAL_COMMUNITY): Payer: Self-pay

## 2022-08-27 ENCOUNTER — Other Ambulatory Visit: Payer: Self-pay

## 2022-08-28 ENCOUNTER — Inpatient Hospital Stay: Payer: Medicare Other

## 2022-08-28 ENCOUNTER — Other Ambulatory Visit: Payer: Self-pay

## 2022-08-28 VITALS — BP 116/68 | HR 99 | Temp 97.4°F | Resp 17

## 2022-08-28 DIAGNOSIS — C182 Malignant neoplasm of ascending colon: Secondary | ICD-10-CM

## 2022-08-28 DIAGNOSIS — Z5111 Encounter for antineoplastic chemotherapy: Secondary | ICD-10-CM | POA: Diagnosis not present

## 2022-08-28 MED ORDER — PEGFILGRASTIM-CBQV 6 MG/0.6ML ~~LOC~~ SOSY
6.0000 mg | PREFILLED_SYRINGE | Freq: Once | SUBCUTANEOUS | Status: AC
  Administered 2022-08-28: 6 mg via SUBCUTANEOUS
  Filled 2022-08-28: qty 0.6

## 2022-08-28 MED ORDER — SODIUM CHLORIDE 0.9% FLUSH
10.0000 mL | INTRAVENOUS | Status: DC | PRN
  Administered 2022-08-28: 10 mL

## 2022-08-28 MED ORDER — HEPARIN SOD (PORK) LOCK FLUSH 100 UNIT/ML IV SOLN
500.0000 [IU] | Freq: Once | INTRAVENOUS | Status: AC | PRN
  Administered 2022-08-28: 500 [IU]

## 2022-08-28 NOTE — Patient Instructions (Signed)

## 2022-09-03 ENCOUNTER — Other Ambulatory Visit (HOSPITAL_COMMUNITY): Payer: Self-pay

## 2022-09-05 ENCOUNTER — Other Ambulatory Visit: Payer: Self-pay | Admitting: Oncology

## 2022-09-05 DIAGNOSIS — C182 Malignant neoplasm of ascending colon: Secondary | ICD-10-CM

## 2022-09-06 ENCOUNTER — Encounter: Payer: Self-pay | Admitting: Oncology

## 2022-09-07 ENCOUNTER — Inpatient Hospital Stay: Payer: Medicare Other | Admitting: Nurse Practitioner

## 2022-09-07 ENCOUNTER — Inpatient Hospital Stay: Payer: Medicare Other

## 2022-09-08 ENCOUNTER — Inpatient Hospital Stay: Payer: Medicare Other

## 2022-09-08 ENCOUNTER — Other Ambulatory Visit (HOSPITAL_COMMUNITY): Payer: Self-pay

## 2022-09-08 ENCOUNTER — Encounter: Payer: Self-pay | Admitting: Nurse Practitioner

## 2022-09-08 ENCOUNTER — Inpatient Hospital Stay: Payer: Medicare Other | Admitting: Nutrition

## 2022-09-08 ENCOUNTER — Telehealth: Payer: Self-pay

## 2022-09-08 ENCOUNTER — Inpatient Hospital Stay (HOSPITAL_BASED_OUTPATIENT_CLINIC_OR_DEPARTMENT_OTHER): Payer: Medicare Other | Admitting: Nurse Practitioner

## 2022-09-08 VITALS — BP 130/62 | HR 89 | Temp 98.2°F | Resp 16 | Wt 144.7 lb

## 2022-09-08 DIAGNOSIS — C182 Malignant neoplasm of ascending colon: Secondary | ICD-10-CM | POA: Diagnosis not present

## 2022-09-08 DIAGNOSIS — Z5111 Encounter for antineoplastic chemotherapy: Secondary | ICD-10-CM | POA: Diagnosis not present

## 2022-09-08 LAB — CMP (CANCER CENTER ONLY)
ALT: 11 U/L (ref 0–44)
AST: 23 U/L (ref 15–41)
Albumin: 3.9 g/dL (ref 3.5–5.0)
Alkaline Phosphatase: 172 U/L — ABNORMAL HIGH (ref 38–126)
Anion gap: 10 (ref 5–15)
BUN: 12 mg/dL (ref 8–23)
CO2: 28 mmol/L (ref 22–32)
Calcium: 9.6 mg/dL (ref 8.9–10.3)
Chloride: 103 mmol/L (ref 98–111)
Creatinine: 0.76 mg/dL (ref 0.44–1.00)
GFR, Estimated: >60 mL/min (ref >60)
Glucose, Bld: 103 mg/dL — ABNORMAL HIGH (ref 70–99)
Potassium: 3.4 mmol/L — ABNORMAL LOW (ref 3.5–5.1)
Sodium: 141 mmol/L (ref 135–145)
Total Bilirubin: 0.7 mg/dL (ref 0.3–1.2)
Total Protein: 6.9 g/dL (ref 6.5–8.1)

## 2022-09-08 LAB — CEA (ACCESS): CEA (CHCC): 1757.33 ng/mL — ABNORMAL HIGH (ref 0.00–5.00)

## 2022-09-08 MED ORDER — SODIUM CHLORIDE 0.9% FLUSH
10.0000 mL | Freq: Once | INTRAVENOUS | Status: AC
  Administered 2022-09-08: 10 mL via INTRAVENOUS

## 2022-09-08 MED ORDER — HEPARIN SOD (PORK) LOCK FLUSH 100 UNIT/ML IV SOLN
500.0000 [IU] | Freq: Once | INTRAVENOUS | Status: AC
  Administered 2022-09-08: 500 [IU] via INTRAVENOUS

## 2022-09-08 NOTE — Progress Notes (Signed)
Nutrition follow up completed with patient receiving FOLFOX for Colon Cancer.  Weight documented as 144 # 11.2 oz. This is decreased from 151 pounds on Dec 19.  Labs pending.  Patient states her appetite is better and she is eating a little more. She thinks her food aversion due to smell has improved. She denies nausea and vomiting.  Takes laxative if she feels constipated. She does not like ONS. Mentioned she ate a biscuit this morning and loved it.  Nutrition Diagnosis: Food and Nutrition Related Knowledge Deficit continues.  Intervention: Educated to eat 3 meals and 3 snacks and focus on higher calorie, higher protein foods. Provided examples. Continue to follow strategies for minimizing Constipation.  Monitoring, Evaluation, Goals: Patient will increase oral intake to minimize wt loss.  Next Visit: To be scheduled with treatment in ~ 1 month.

## 2022-09-08 NOTE — Progress Notes (Signed)
  [place] OFFICE PROGRESS NOTE   Diagnosis: Colon cancer  INTERVAL HISTORY:   Christina [name] returns as scheduled.  She completed cycle 3 FOLFOX 08/24/2022.  She denies nausea/vomit.  No mouth sores.  No significant diarrhea.  Cold sensitivity lasted for a few days.  No persistent neuropathy symptoms.  She reports nasal congestion and a stuffy sensation in the right ear.  Objective:  Vital signs in last 24 hours:  Blood pressure 130/62, pulse 89, temperature 98.2 F (36.8 C), temperature source Temporal, resp. rate 16, weight 144 lb 11.2 oz (65.6 kg), SpO2 97 %.    HEENT: No thrush or ulcers.  Unremarkable tympanic membrane bilaterally.  Small amount of cerumen bilaterally. Resp: Lungs clear bilaterally. Cardio: Regular rate and rhythm. GI: Abdomen soft and nontender.  No hepatosplenomegaly. Vascular: No leg edema. Neuro: Vibratory sense intact over the fingertips per tuning fork exam. Skin: Palms without erythema. Port-A-Cath without erythema.  Lab Results:  Lab Results  Component Value Date   WBC 11.3 (H) 09/08/2022   HGB 10.1 (L) 09/08/2022   HCT 33.4 (L) 09/08/2022   MCV 84.6 09/08/2022   PLT 208 09/08/2022   NEUTROABS 6.5 09/08/2022    Imaging:  No results found.  Medications: I have reviewed the patient's current medications.  Assessment/Plan: Metastatic colon cancer CT abdomen/pelvis 06/22/2022, interval appendectomy, extensive interstitial and extraluminal gas surrounding the surgical staples at the resection margin, extensive cecal wall and terminal ileum thickening, pathologic adenopathy in the ileocolic lymph nodes, interval development of by lobar hypoenhancing hepatic masses Ultrasound-guided biopsy of left liver mass 06/28/2022-static adenocarcinoma, CDX2 and CK20 positive; microsatellite stable, tumor mutation burden 4, K-ras G13D Colonoscopy 07/19/2022-malignant tumor in the cecum-moderately differentiated adenocarcinoma with mucinous  features and background of high-grade dysplasia, ileocecal valve polyp-negative for malignancy Cycle 1 FOLFOX 07/28/2022 Cycle 2 FOLFOX 08/11/2022, Udenyca Cycle 3 FOLFOX 08/25/2022, Udenyca Cycle 4 FOLFOX 09/09/2022, Udenyca 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  Right breast cancer 08/28/2015-pT2pN0, stage IIa invasive ductal carcinoma, grade 1, HER2 negative, ER positive, PR positive-adjuvant radiation and anastrozole 12/29/2015-May 2022 4.  Left breast cancer 08/28/2015,pT1cpN0, stage Ia invasive ductal carcinoma, grade 1, HER2 negative, ER positive, PR positive-adjuvant radiation, anastrozole 12/29/2015-May 2022   5.  Hypertension   6.  Upper abdominal pain-likely secondary to metastatic disease involving the liver and the cecal tumor   7.  Port-A-Cath placement 07/23/2022  Disposition: Christina [name] appears stable.  She has completed 3 cycles of FOLFOX.  She is tolerating treatment well.  We reviewed the improvement in the CEA since beginning treatment.  Plan to proceed with cycle 4 as scheduled 09/09/2022.  CBC reviewed.  Counts adequate to proceed as above.  She will return for follow-up in 2 weeks.  We are available to see her sooner if needed.    Ned Card ANP/GNP-BC   09/08/2022  2:52 PM

## 2022-09-08 NOTE — Telephone Encounter (Signed)
Patient seen by Ned Card, NP today  Labs reviewed by Ned Card, NP and are within treatment parameters.  Per physician team, patient is ready for treatment on 09/09/22 and there are NO modifications to the treatment plan.

## 2022-09-08 NOTE — Telephone Encounter (Signed)
Patient is currently taking her K+ as order.

## 2022-09-08 NOTE — Telephone Encounter (Signed)
-----  Message from Owens Shark, NP sent at 09/08/2022  4:09 PM EST ----- Please let her know potassium level is better, just slightly low on today's blood work.  Continue potassium as she is currently taking.

## 2022-09-09 ENCOUNTER — Other Ambulatory Visit (HOSPITAL_COMMUNITY): Payer: Self-pay

## 2022-09-09 ENCOUNTER — Inpatient Hospital Stay: Payer: Medicare Other

## 2022-09-09 VITALS — BP 111/73 | HR 73 | Temp 97.8°F | Resp 16

## 2022-09-09 DIAGNOSIS — C182 Malignant neoplasm of ascending colon: Secondary | ICD-10-CM

## 2022-09-09 DIAGNOSIS — Z5111 Encounter for antineoplastic chemotherapy: Secondary | ICD-10-CM | POA: Diagnosis not present

## 2022-09-09 LAB — CBC WITH DIFFERENTIAL (CANCER CENTER ONLY)
Abs Immature Granulocytes: 1.17 10*3/uL — ABNORMAL HIGH (ref 0.00–0.07)
Basophils Absolute: 0.1 10*3/uL (ref 0.0–0.1)
Basophils Relative: 1 %
Eosinophils Absolute: 0.1 10*3/uL (ref 0.0–0.5)
Eosinophils Relative: 1 %
HCT: 33.4 % — ABNORMAL LOW (ref 36.0–46.0)
Hemoglobin: 10.1 g/dL — ABNORMAL LOW (ref 12.0–15.0)
Immature Granulocytes: 10 %
Lymphocytes Relative: 22 %
Lymphs Abs: 2.4 10*3/uL (ref 0.7–4.0)
MCH: 25.6 pg — ABNORMAL LOW (ref 26.0–34.0)
MCHC: 30.2 g/dL (ref 30.0–36.0)
MCV: 84.6 fL (ref 80.0–100.0)
Monocytes Absolute: 1 10*3/uL (ref 0.1–1.0)
Monocytes Relative: 9 %
Neutro Abs: 6.5 10*3/uL (ref 1.7–7.7)
Neutrophils Relative %: 57 %
Platelet Count: 208 10*3/uL (ref 150–400)
RBC: 3.95 MIL/uL (ref 3.87–5.11)
RDW: 19 % — ABNORMAL HIGH (ref 11.5–15.5)
WBC Count: 11.3 10*3/uL — ABNORMAL HIGH (ref 4.0–10.5)
nRBC: 0.8 % — ABNORMAL HIGH (ref 0.0–0.2)

## 2022-09-09 MED ORDER — FLUOROURACIL CHEMO INJECTION 2.5 GM/50ML
400.0000 mg/m2 | Freq: Once | INTRAVENOUS | Status: AC
  Administered 2022-09-09: 750 mg via INTRAVENOUS
  Filled 2022-09-09: qty 15

## 2022-09-09 MED ORDER — OLMESARTAN MEDOXOMIL 20 MG PO TABS
20.0000 mg | ORAL_TABLET | Freq: Every day | ORAL | 3 refills | Status: DC
  Filled 2022-09-09: qty 90, 90d supply, fill #0

## 2022-09-09 MED ORDER — LEUCOVORIN CALCIUM INJECTION 350 MG
400.0000 mg/m2 | Freq: Once | INTRAVENOUS | Status: DC
  Filled 2022-09-09: qty 36.4

## 2022-09-09 MED ORDER — SODIUM CHLORIDE 0.9 % IV SOLN
10.0000 mg | Freq: Once | INTRAVENOUS | Status: AC
  Administered 2022-09-09: 10 mg via INTRAVENOUS
  Filled 2022-09-09: qty 10

## 2022-09-09 MED ORDER — SODIUM CHLORIDE 0.9 % IV SOLN
2400.0000 mg/m2 | INTRAVENOUS | Status: DC
  Administered 2022-09-09: 4350 mg via INTRAVENOUS
  Filled 2022-09-09: qty 87

## 2022-09-09 MED ORDER — SODIUM CHLORIDE 0.9 % IV SOLN
150.0000 mg | Freq: Once | INTRAVENOUS | Status: AC
  Administered 2022-09-09: 150 mg via INTRAVENOUS
  Filled 2022-09-09: qty 150

## 2022-09-09 MED ORDER — LEUCOVORIN CALCIUM INJECTION 350 MG
400.0000 mg/m2 | Freq: Once | INTRAVENOUS | Status: AC
  Administered 2022-09-09: 728 mg via INTRAVENOUS
  Filled 2022-09-09: qty 36.4

## 2022-09-09 MED ORDER — DEXTROSE 5 % IV SOLN: Freq: Once | INTRAVENOUS | Status: AC

## 2022-09-09 MED ORDER — PROCHLORPERAZINE MALEATE 10 MG PO TABS
10.0000 mg | ORAL_TABLET | Freq: Once | ORAL | Status: AC
  Administered 2022-09-09: 10 mg via ORAL
  Filled 2022-09-09: qty 1

## 2022-09-09 MED ORDER — AMLODIPINE BESYLATE 10 MG PO TABS
10.0000 mg | ORAL_TABLET | ORAL | 1 refills | Status: DC
  Filled 2022-09-09: qty 90, 90d supply, fill #0

## 2022-09-09 MED ORDER — OXALIPLATIN CHEMO INJECTION 100 MG/20ML
83.0000 mg/m2 | Freq: Once | INTRAVENOUS | Status: AC
  Administered 2022-09-09: 150 mg via INTRAVENOUS
  Filled 2022-09-09: qty 20

## 2022-09-09 NOTE — Patient Instructions (Addendum)
[facility]   The chemotherapy medication bag should finish at 46 hours, 96 hours, or 7 days. For example, if your pump is scheduled for 46 hours and it was put on at 4:00 p.m., it should finish at 2:00 p.m. the day it is scheduled to come off regardless of your appointment time.     Estimated time to finish at 12:45 Saturday, September 11, 2022.   If the display on your pump reads "Low Volume" and it is beeping, take the batteries out of the pump and come to the cancer center for it to be taken off.   If the pump alarms go off prior to the pump reading "Low Volume" then call [phone] and someone can assist you.  If the plunger comes out and the chemotherapy medication is leaking out, please use your home chemo spill kit to clean up the spill. Do NOT use paper towels or other household products.  If you have problems or questions regarding your pump, please call either [phone] (24 hours a day) or the cancer center Monday-Friday 8:00 a.m.- 4:30 p.m. at the clinic number and we will assist you. If you are unable to get assistance, then go to the nearest Emergency Department and ask the staff to contact the IV team for assistance.  Discharge Instructions: Thank you for choosing [facility] to provide your oncology and hematology care.   If you have a lab appointment with the [facility], please go directly to the [facility] and check in at the registration area.   Wear comfortable clothing and clothing appropriate for easy access to any Portacath or PICC line.   We strive to give you quality time with your provider. You may need to reschedule your appointment if you arrive late (15 or more minutes).  Arriving late affects you and other patients whose appointments are after yours.  Also, if you miss three or more appointments without notifying the office, you may be dismissed from the clinic at the provider's discretion.      For prescription  refill requests, have your pharmacy contact our office and allow 72 hours for refills to be completed.    Today you received the following chemotherapy and/or immunotherapy agents Oxaliplatin, Leucovorin, Fluorouracil.      To help prevent nausea and vomiting after your treatment, we encourage you to take your nausea medication as directed.  BELOW ARE SYMPTOMS THAT SHOULD BE REPORTED IMMEDIATELY: *FEVER GREATER THAN 100.4 F (38 C) OR HIGHER *CHILLS OR SWEATING *NAUSEA AND VOMITING THAT IS NOT CONTROLLED WITH YOUR NAUSEA MEDICATION *UNUSUAL SHORTNESS OF BREATH *UNUSUAL BRUISING OR BLEEDING *URINARY PROBLEMS (pain or burning when urinating, or frequent urination) *BOWEL PROBLEMS (unusual diarrhea, constipation, pain near the anus) TENDERNESS IN MOUTH AND THROAT WITH OR WITHOUT PRESENCE OF ULCERS (sore throat, sores in mouth, or a toothache) UNUSUAL RASH, SWELLING OR PAIN  UNUSUAL VAGINAL DISCHARGE OR ITCHING   Items with * indicate a potential emergency and should be followed up as soon as possible or go to the Emergency Department if any problems should occur.  Please show the CHEMOTHERAPY ALERT CARD or IMMUNOTHERAPY ALERT CARD at check-in to the Emergency Department and triage nurse.  Should you have questions after your visit or need to cancel or reschedule your appointment, please contact [facility]  Dept: [phone]  and follow the prompts.  Office hours are 8:00 a.m. to 4:30 p.m. Monday - Friday. Please note that voicemails  left after 4:00 p.m. may not be returned until the following business day.  We are closed weekends and major holidays. You have access to a nurse at all times for urgent questions. Please call the main number to the clinic Dept: [phone] and follow the prompts.   For any non-urgent questions, you may also contact your provider using MyChart. We now offer e-Visits for anyone 70 and older to request care online for non-urgent  symptoms. For details visit [email or web address].   Also download the MyChart app! Go to the app store, search "MyChart", open the app, select [facility], and log in with your MyChart username and password.  Oxaliplatin Injection What is this medication? OXALIPLATIN (ox AL i PLA tin) treats some types of cancer. It works by slowing down the growth of cancer cells. This medicine may be used for other purposes; ask your health care provider or pharmacist if you have questions. COMMON BRAND NAME(S): Eloxatin What should I tell my care team before I take this medication? They need to know if you have any of these conditions: Heart disease History of irregular heartbeat or rhythm Liver disease Low blood cell levels (white cells, red cells, and platelets) Lung or breathing disease, such as asthma Take medications that treat or prevent blood clots Tingling of the fingers, toes, or other nerve disorder An unusual or allergic reaction to oxaliplatin, other medications, foods, dyes, or preservatives If you or your partner are pregnant or trying to get pregnant Breast-feeding How should I use this medication? This medication is injected into a vein. It is given by your care team in a hospital or clinic setting. Talk to your care team about the use of this medication in children. Special care may be needed. Overdosage: If you think you have taken too much of this medicine contact a poison control center or emergency room at once. NOTE: This medicine is only for you. Do not share this medicine with others. What if I miss a dose? Keep appointments for follow-up doses. It is important not to miss a dose. Call your care team if you are unable to keep an appointment. What may interact with this medication? Do not take this medication with any of the following: Cisapride Dronedarone Pimozide Thioridazine This medication may also interact with the following: Aspirin and aspirin-like  medications Certain medications that treat or prevent blood clots, such as warfarin, apixaban, dabigatran, and rivaroxaban Cisplatin Cyclosporine Diuretics Medications for infection, such as acyclovir, adefovir, amphotericin B, bacitracin, cidofovir, foscarnet, ganciclovir, gentamicin, pentamidine, vancomycin NSAIDs, medications for pain and inflammation, such as ibuprofen or naproxen Other medications that cause heart rhythm changes Pamidronate Zoledronic acid This list may not describe all possible interactions. Give your health care provider a list of all the medicines, herbs, non-prescription drugs, or dietary supplements you use. Also tell them if you smoke, drink alcohol, or use illegal drugs. Some items may interact with your medicine. What should I watch for while using this medication? Your condition will be monitored carefully while you are receiving this medication. You may need blood work while taking this medication. This medication may make you feel generally unwell. This is not uncommon as chemotherapy can affect healthy cells as well as cancer cells. Report any side effects. Continue your course of treatment even though you feel ill unless your care team tells you to stop. This medication may increase your risk of getting an infection. Call your care team for advice if you get a fever, chills, sore throat, or  other symptoms of a cold or flu. Do not treat yourself. Try to avoid being around people who are sick. Avoid taking medications that contain aspirin, acetaminophen, ibuprofen, naproxen, or ketoprofen unless instructed by your care team. These medications may hide a fever. Be careful brushing or flossing your teeth or using a toothpick because you may get an infection or bleed more easily. If you have any dental work done, tell your dentist you are receiving this medication. This medication can make you more sensitive to cold. Do not drink cold drinks or use ice. Cover exposed  skin before coming in contact with cold temperatures or cold objects. When out in cold weather wear warm clothing and cover your mouth and nose to warm the air that goes into your lungs. Tell your care team if you get sensitive to the cold. Talk to your care team if you or your partner are pregnant or think either of you might be pregnant. This medication can cause serious birth defects if taken during pregnancy and for 9 months after the last dose. A negative pregnancy test is required before starting this medication. A reliable form of contraception is recommended while taking this medication and for 9 months after the last dose. Talk to your care team about effective forms of contraception. Do not father a child while taking this medication and for 6 months after the last dose. Use a condom while having sex during this time period. Do not breastfeed while taking this medication and for 3 months after the last dose. This medication may cause infertility. Talk to your care team if you are concerned about your fertility. What side effects may I notice from receiving this medication? Side effects that you should report to your care team as soon as possible: Allergic reactions--skin rash, itching, hives, swelling of the face, lips, tongue, or throat Bleeding--bloody or black, tar-like stools, vomiting blood or brown material that looks like coffee grounds, red or dark brown urine, small red or purple spots on skin, unusual bruising or bleeding Dry cough, shortness of breath or trouble breathing Heart rhythm changes--fast or irregular heartbeat, dizziness, feeling faint or lightheaded, chest pain, trouble breathing Infection--fever, chills, cough, sore throat, wounds that don't heal, pain or trouble when passing urine, general feeling of discomfort or being unwell Liver injury--right upper belly pain, loss of appetite, nausea, light-colored stool, dark yellow or brown urine, yellowing skin or eyes, unusual  weakness or fatigue Low red blood cell level--unusual weakness or fatigue, dizziness, headache, trouble breathing Muscle injury--unusual weakness or fatigue, muscle pain, dark yellow or brown urine, decrease in amount of urine Pain, tingling, or numbness in the hands or feet Sudden and severe headache, confusion, change in vision, seizures, which may be signs of posterior reversible encephalopathy syndrome (PRES) Unusual bruising or bleeding Side effects that usually do not require medical attention (report to your care team if they continue or are bothersome): Diarrhea Nausea Pain, redness, or swelling with sores inside the mouth or throat Unusual weakness or fatigue Vomiting This list may not describe all possible side effects. Call your doctor for medical advice about side effects. You may report side effects to FDA at 1-800-FDA-1088. Where should I keep my medication? This medication is given in a hospital or clinic. It will not be stored at home. NOTE: This sheet is a summary. It may not cover all possible information. If you have questions about this medicine, talk to your doctor, pharmacist, or health care provider.  2023 Elsevier/Gold Standard (  2007-09-30 00:00:00)  Leucovorin Injection What is this medication? LEUCOVORIN (loo koe VOR in) prevents side effects from certain medications, such as methotrexate. It works by increasing folate levels. This helps protect healthy cells in your body. It may also be used to treat anemia caused by low levels of folate. It can also be used with fluorouracil, a type of chemotherapy, to treat colorectal cancer. It works by increasing the effects of fluorouracil in the body. This medicine may be used for other purposes; ask your health care provider or pharmacist if you have questions. What should I tell my care team before I take this medication? They need to know if you have any of these conditions: Anemia from low levels of vitamin B12 in the  blood An unusual or allergic reaction to leucovorin, folic acid, other medications, foods, dyes, or preservatives Pregnant or trying to get pregnant Breastfeeding How should I use this medication? This medication is injected into a vein or a muscle. It is given by your care team in a hospital or clinic setting. Talk to your care team about the use of this medication in children. Special care may be needed. Overdosage: If you think you have taken too much of this medicine contact a poison control center or emergency room at once. NOTE: This medicine is only for you. Do not share this medicine with others. What if I miss a dose? Keep appointments for follow-up doses. It is important not to miss your dose. Call your care team if you are unable to keep an appointment. What may interact with this medication? Capecitabine Fluorouracil Phenobarbital Phenytoin Primidone Trimethoprim;sulfamethoxazole This list may not describe all possible interactions. Give your health care provider a list of all the medicines, herbs, non-prescription drugs, or dietary supplements you use. Also tell them if you smoke, drink alcohol, or use illegal drugs. Some items may interact with your medicine. What should I watch for while using this medication? Your condition will be monitored carefully while you are receiving this medication. This medication may increase the side effects of 5-fluorouracil. Tell your care team if you have diarrhea or mouth sores that do not get better or that get worse. What side effects may I notice from receiving this medication? Side effects that you should report to your care team as soon as possible: Allergic reactions--skin rash, itching, hives, swelling of the face, lips, tongue, or throat This list may not describe all possible side effects. Call your doctor for medical advice about side effects. You may report side effects to FDA at 1-800-FDA-1088. Where should I keep my  medication? This medication is given in a hospital or clinic. It will not be stored at home. NOTE: This sheet is a summary. It may not cover all possible information. If you have questions about this medicine, talk to your doctor, pharmacist, or health care provider.  2023 Elsevier/Gold Standard (2022-01-12 00:00:00)  Fluorouracil Injection What is this medication? FLUOROURACIL (flure oh YOOR a sil) treats some types of cancer. It works by slowing down the growth of cancer cells. This medicine may be used for other purposes; ask your health care provider or pharmacist if you have questions. COMMON BRAND NAME(S): Adrucil What should I tell my care team before I take this medication? They need to know if you have any of these conditions: Blood disorders Dihydropyrimidine dehydrogenase (DPD) deficiency Infection, such as chickenpox, cold sores, herpes Kidney disease Liver disease Poor nutrition Recent or ongoing radiation therapy An unusual or allergic reaction to  fluorouracil, other medications, foods, dyes, or preservatives If you or your partner are pregnant or trying to get pregnant Breast-feeding How should I use this medication? This medication is injected into a vein. It is administered by your care team in a hospital or clinic setting. Talk to your care team about the use of this medication in children. Special care may be needed. Overdosage: If you think you have taken too much of this medicine contact a poison control center or emergency room at once. NOTE: This medicine is only for you. Do not share this medicine with others. What if I miss a dose? Keep appointments for follow-up doses. It is important not to miss your dose. Call your care team if you are unable to keep an appointment. What may interact with this medication? Do not take this medication with any of the following: Live virus vaccines This medication may also interact with the following: Medications that treat  or prevent blood clots, such as warfarin, enoxaparin, dalteparin This list may not describe all possible interactions. Give your health care provider a list of all the medicines, herbs, non-prescription drugs, or dietary supplements you use. Also tell them if you smoke, drink alcohol, or use illegal drugs. Some items may interact with your medicine. What should I watch for while using this medication? Your condition will be monitored carefully while you are receiving this medication. This medication may make you feel generally unwell. This is not uncommon as chemotherapy can affect healthy cells as well as cancer cells. Report any side effects. Continue your course of treatment even though you feel ill unless your care team tells you to stop. In some cases, you may be given additional medications to help with side effects. Follow all directions for their use. This medication may increase your risk of getting an infection. Call your care team for advice if you get a fever, chills, sore throat, or other symptoms of a cold or flu. Do not treat yourself. Try to avoid being around people who are sick. This medication may increase your risk to bruise or bleed. Call your care team if you notice any unusual bleeding. Be careful brushing or flossing your teeth or using a toothpick because you may get an infection or bleed more easily. If you have any dental work done, tell your dentist you are receiving this medication. Avoid taking medications that contain aspirin, acetaminophen, ibuprofen, naproxen, or ketoprofen unless instructed by your care team. These medications may hide a fever. Do not treat diarrhea with over the counter products. Contact your care team if you have diarrhea that lasts more than 2 days or if it is severe and watery. This medication can make you more sensitive to the sun. Keep out of the sun. If you cannot avoid being in the sun, wear protective clothing and sunscreen. Do not use sun lamps,  tanning beds, or tanning booths. Talk to your care team if you or your partner wish to become pregnant or think you might be pregnant. This medication can cause serious birth defects if taken during pregnancy and for 3 months after the last dose. A reliable form of contraception is recommended while taking this medication and for 3 months after the last dose. Talk to your care team about effective forms of contraception. Do not father a child while taking this medication and for 3 months after the last dose. Use a condom while having sex during this time period. Do not breastfeed while taking this medication. This medication  may cause infertility. Talk to your care team if you are concerned about your fertility. What side effects may I notice from receiving this medication? Side effects that you should report to your care team as soon as possible: Allergic reactions--skin rash, itching, hives, swelling of the face, lips, tongue, or throat Heart attack--pain or tightness in the chest, shoulders, arms, or jaw, nausea, shortness of breath, cold or clammy skin, feeling faint or lightheaded Heart failure--shortness of breath, swelling of the ankles, feet, or hands, sudden weight gain, unusual weakness or fatigue Heart rhythm changes--fast or irregular heartbeat, dizziness, feeling faint or lightheaded, chest pain, trouble breathing High ammonia level--unusual weakness or fatigue, confusion, loss of appetite, nausea, vomiting, seizures Infection--fever, chills, cough, sore throat, wounds that don't heal, pain or trouble when passing urine, general feeling of discomfort or being unwell Low red blood cell level--unusual weakness or fatigue, dizziness, headache, trouble breathing Pain, tingling, or numbness in the hands or feet, muscle weakness, change in vision, confusion or trouble speaking, loss of balance or coordination, trouble walking, seizures Redness, swelling, and blistering of the skin over hands and  feet Severe or prolonged diarrhea Unusual bruising or bleeding Side effects that usually do not require medical attention (report to your care team if they continue or are bothersome): Dry skin Headache Increased tears Nausea Pain, redness, or swelling with sores inside the mouth or throat Sensitivity to light Vomiting This list may not describe all possible side effects. Call your doctor for medical advice about side effects. You may report side effects to FDA at 1-800-FDA-1088. Where should I keep my medication? This medication is given in a hospital or clinic. It will not be stored at home. NOTE: This sheet is a summary. It may not cover all possible information. If you have questions about this medicine, talk to your doctor, pharmacist, or health care provider.  2023 Elsevier/Gold Standard (2021-12-08 00:00:00)

## 2022-09-10 ENCOUNTER — Other Ambulatory Visit (HOSPITAL_COMMUNITY): Payer: Self-pay

## 2022-09-10 ENCOUNTER — Inpatient Hospital Stay: Payer: Medicare Other

## 2022-09-10 ENCOUNTER — Other Ambulatory Visit: Payer: Self-pay

## 2022-09-11 ENCOUNTER — Inpatient Hospital Stay: Payer: Medicare Other

## 2022-09-11 VITALS — BP 105/63 | HR 108 | Temp 97.2°F | Resp 14

## 2022-09-11 DIAGNOSIS — Z5111 Encounter for antineoplastic chemotherapy: Secondary | ICD-10-CM | POA: Diagnosis not present

## 2022-09-11 MED ORDER — SODIUM CHLORIDE 0.9% FLUSH
10.0000 mL | INTRAVENOUS | Status: DC | PRN
  Administered 2022-09-11: 10 mL

## 2022-09-11 MED ORDER — PEGFILGRASTIM-CBQV 6 MG/0.6ML ~~LOC~~ SOSY
6.0000 mg | PREFILLED_SYRINGE | Freq: Once | SUBCUTANEOUS | Status: AC
  Administered 2022-09-11: 6 mg via SUBCUTANEOUS

## 2022-09-11 MED ORDER — HEPARIN SOD (PORK) LOCK FLUSH 100 UNIT/ML IV SOLN
500.0000 [IU] | Freq: Once | INTRAVENOUS | Status: AC | PRN
  Administered 2022-09-11: 500 [IU]

## 2022-09-19 ENCOUNTER — Other Ambulatory Visit: Payer: Self-pay | Admitting: Oncology

## 2022-09-19 DIAGNOSIS — C182 Malignant neoplasm of ascending colon: Secondary | ICD-10-CM

## 2022-09-22 ENCOUNTER — Ambulatory Visit: Payer: Medicare Other

## 2022-09-22 ENCOUNTER — Inpatient Hospital Stay: Payer: Medicare Other

## 2022-09-22 ENCOUNTER — Ambulatory Visit: Payer: Medicare Other | Admitting: Oncology

## 2022-09-22 ENCOUNTER — Encounter: Payer: Self-pay | Admitting: Nurse Practitioner

## 2022-09-22 ENCOUNTER — Other Ambulatory Visit (HOSPITAL_BASED_OUTPATIENT_CLINIC_OR_DEPARTMENT_OTHER): Payer: Self-pay

## 2022-09-22 ENCOUNTER — Inpatient Hospital Stay (HOSPITAL_BASED_OUTPATIENT_CLINIC_OR_DEPARTMENT_OTHER): Payer: Medicare Other | Admitting: Nurse Practitioner

## 2022-09-22 ENCOUNTER — Other Ambulatory Visit: Payer: Medicare Other

## 2022-09-22 VITALS — BP 103/63 | HR 100 | Temp 98.1°F | Resp 18 | Ht 66.0 in | Wt 135.0 lb

## 2022-09-22 DIAGNOSIS — C182 Malignant neoplasm of ascending colon: Secondary | ICD-10-CM

## 2022-09-22 DIAGNOSIS — Z95828 Presence of other vascular implants and grafts: Secondary | ICD-10-CM

## 2022-09-22 DIAGNOSIS — Z5111 Encounter for antineoplastic chemotherapy: Secondary | ICD-10-CM | POA: Diagnosis not present

## 2022-09-22 LAB — CBC WITH DIFFERENTIAL (CANCER CENTER ONLY)
Abs Immature Granulocytes: 0.3 10*3/uL — ABNORMAL HIGH (ref 0.00–0.07)
Band Neutrophils: 3 %
Basophils Absolute: 0 10*3/uL (ref 0.0–0.1)
Basophils Relative: 0 %
Eosinophils Absolute: 0 10*3/uL (ref 0.0–0.5)
Eosinophils Relative: 0 %
HCT: 33.6 % — ABNORMAL LOW (ref 36.0–46.0)
Hemoglobin: 10.6 g/dL — ABNORMAL LOW (ref 12.0–15.0)
Lymphocytes Relative: 22 %
Lymphs Abs: 3.8 10*3/uL (ref 0.7–4.0)
MCH: 26.2 pg (ref 26.0–34.0)
MCHC: 31.5 g/dL (ref 30.0–36.0)
MCV: 83.2 fL (ref 80.0–100.0)
Metamyelocytes Relative: 1 %
Monocytes Absolute: 1.4 10*3/uL — ABNORMAL HIGH (ref 0.1–1.0)
Monocytes Relative: 8 %
Myelocytes: 1 %
Neutro Abs: 11.6 10*3/uL — ABNORMAL HIGH (ref 1.7–7.7)
Neutrophils Relative %: 65 %
Platelet Count: 120 10*3/uL — ABNORMAL LOW (ref 150–400)
RBC: 4.04 MIL/uL (ref 3.87–5.11)
RDW: 20.5 % — ABNORMAL HIGH (ref 11.5–15.5)
WBC Count: 17.1 10*3/uL — ABNORMAL HIGH (ref 4.0–10.5)
nRBC: 0.5 % — ABNORMAL HIGH (ref 0.0–0.2)

## 2022-09-22 LAB — CMP (CANCER CENTER ONLY)
ALT: 12 U/L (ref 0–44)
AST: 27 U/L (ref 15–41)
Albumin: 3.7 g/dL (ref 3.5–5.0)
Alkaline Phosphatase: 170 U/L — ABNORMAL HIGH (ref 38–126)
Anion gap: 11 (ref 5–15)
BUN: 13 mg/dL (ref 8–23)
CO2: 26 mmol/L (ref 22–32)
Calcium: 9 mg/dL (ref 8.9–10.3)
Chloride: 102 mmol/L (ref 98–111)
Creatinine: 0.82 mg/dL (ref 0.44–1.00)
GFR, Estimated: >60 mL/min (ref >60)
Glucose, Bld: 121 mg/dL — ABNORMAL HIGH (ref 70–99)
Potassium: 3.5 mmol/L (ref 3.5–5.1)
Sodium: 139 mmol/L (ref 135–145)
Total Bilirubin: 0.7 mg/dL (ref 0.3–1.2)
Total Protein: 6.6 g/dL (ref 6.5–8.1)

## 2022-09-22 MED ORDER — SODIUM CHLORIDE 0.9% FLUSH
10.0000 mL | Freq: Once | INTRAVENOUS | Status: AC
  Administered 2022-09-22: 10 mL via INTRAVENOUS

## 2022-09-22 MED ORDER — MEGESTROL ACETATE 400 MG/10ML PO SUSP
200.0000 mg | Freq: Two times a day (BID) | ORAL | 0 refills | Status: DC
  Filled 2022-09-22 (×2): qty 240, 24d supply, fill #0

## 2022-09-22 MED ORDER — HEPARIN SOD (PORK) LOCK FLUSH 100 UNIT/ML IV SOLN
500.0000 [IU] | Freq: Once | INTRAVENOUS | Status: AC
  Administered 2022-09-22: 500 [IU] via INTRAVENOUS

## 2022-09-22 NOTE — Progress Notes (Signed)
  [facility] OFFICE PROGRESS NOTE   Diagnosis: Colon cancer  INTERVAL HISTORY:   Christina [name] returns as scheduled.  She completed cycle 4 FOLFOX 09/09/2022.  She denies nausea following chemotherapy.  No mouth sores.  No diarrhea.  No significant issues with cold sensitivity.  No numbness or tingling in the hands or feet at present.  Her main complaint is a poor appetite and weight loss.  She becomes full quickly.  Objective:  Vital signs in last 24 hours:  Blood pressure 103/63, pulse 100, temperature 98.1 F (36.7 C), temperature source Oral, resp. rate 18, height '5\' 6"'$  (1.676 m), weight 135 lb (61.2 kg), SpO2 99 %.    HEENT: No thrush or ulcers. Resp: Lungs clear bilaterally. Cardio: Regular rate and rhythm. GI: Abdomen soft and nontender.  No hepatosplenomegaly. Vascular: No leg edema. Neuro: Vibratory sense intact over the fingertips per tuning fork exam. Skin: Palms without erythema. Port-A-Cath without erythema.   Lab Results:  Lab Results  Component Value Date   WBC 17.1 (H) 09/22/2022   HGB 10.6 (L) 09/22/2022   HCT 33.6 (L) 09/22/2022   MCV 83.2 09/22/2022   PLT 120 (L) 09/22/2022   NEUTROABS 11.6 (H) 09/22/2022    Imaging:  No results found.  Medications: I have reviewed the patient's current medications.  Assessment/Plan: Metastatic colon cancer CT abdomen/pelvis 06/22/2022, interval appendectomy, extensive interstitial and extraluminal gas surrounding the surgical staples at the resection margin, extensive cecal wall and terminal ileum thickening, pathologic adenopathy in the ileocolic lymph nodes, interval development of by lobar hypoenhancing hepatic masses Ultrasound-guided biopsy of left liver mass 06/28/2022-static adenocarcinoma, CDX2 and CK20 positive; microsatellite stable, tumor mutation burden 4, K-ras G13D Colonoscopy 07/19/2022-malignant tumor in the cecum-moderately differentiated adenocarcinoma with mucinous features and  background of high-grade dysplasia, ileocecal valve polyp-negative for malignancy Cycle 1 FOLFOX 07/28/2022 Cycle 2 FOLFOX 08/11/2022, Udenyca Cycle 3 FOLFOX 08/25/2022, Udenyca Cycle 4 FOLFOX 09/09/2022, Udenyca 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  Right breast cancer 08/28/2015-pT2pN0, stage IIa invasive ductal carcinoma, grade 1, HER2 negative, ER positive, PR positive-adjuvant radiation and anastrozole 12/29/2015-May 2022 4.  Left breast cancer 08/28/2015,pT1cpN0, stage Ia invasive ductal carcinoma, grade 1, HER2 negative, ER positive, PR positive-adjuvant radiation, anastrozole 12/29/2015-May 2022   5.  Hypertension   6.  Upper abdominal pain-likely secondary to metastatic disease involving the liver and the cecal tumor   7.  Port-A-Cath placement 07/23/2022  Disposition: [name] has completed 4 cycles of FOLFOX chemotherapy.  Overall she has tolerated chemotherapy well but continues to have anorexia/weight loss.  We decided to hold tomorrow's treatment and refer for restaging CT scans.    She has been on prednisone for appetite stimulation.  She will discontinue prednisone and begin Megace 200 mg twice daily.  She will have CT scans 09/30/2022.  We will see her in follow-up 10/01/2022.  We are available to see her sooner if needed.    Ned Card ANP/GNP-BC   09/22/2022  3:36 PM

## 2022-09-22 NOTE — Patient Instructions (Signed)

## 2022-09-23 ENCOUNTER — Inpatient Hospital Stay: Payer: Medicare Other

## 2022-09-23 ENCOUNTER — Other Ambulatory Visit (HOSPITAL_COMMUNITY): Payer: Self-pay

## 2022-09-25 ENCOUNTER — Inpatient Hospital Stay: Payer: Medicare Other

## 2022-09-30 ENCOUNTER — Ambulatory Visit (HOSPITAL_COMMUNITY)
Admission: RE | Admit: 2022-09-30 | Discharge: 2022-09-30 | Disposition: A | Discharge status: Home / Self Care | Payer: Medicare Other | Source: Ambulatory Visit | Attending: Nurse Practitioner | Admitting: Nurse Practitioner

## 2022-09-30 DIAGNOSIS — C182 Malignant neoplasm of ascending colon: Secondary | ICD-10-CM | POA: Insufficient documentation

## 2022-09-30 MED ORDER — IOHEXOL 300 MG/ML  SOLN
100.0000 mL | Freq: Once | INTRAMUSCULAR | Status: AC | PRN
  Administered 2022-09-30: 100 mL via INTRAVENOUS

## 2022-09-30 MED ORDER — IOHEXOL 9 MG/ML PO SOLN
ORAL | Status: AC
  Filled 2022-09-30: qty 1000

## 2022-09-30 MED ORDER — IOHEXOL 9 MG/ML PO SOLN
500.0000 mL | ORAL | Status: AC
  Administered 2022-09-30 (×2): 500 mL via ORAL

## 2022-10-01 ENCOUNTER — Inpatient Hospital Stay: Payer: Medicare Other

## 2022-10-01 ENCOUNTER — Encounter: Payer: Self-pay | Admitting: Nurse Practitioner

## 2022-10-01 ENCOUNTER — Other Ambulatory Visit: Payer: Self-pay

## 2022-10-01 ENCOUNTER — Telehealth: Payer: Self-pay

## 2022-10-01 ENCOUNTER — Inpatient Hospital Stay: Payer: Medicare Other | Attending: Oncology | Admitting: Nurse Practitioner

## 2022-10-01 VITALS — BP 99/67 | HR 98 | Temp 98.8°F | Resp 18

## 2022-10-01 VITALS — BP 95/58 | HR 120 | Temp 98.1°F | Resp 18 | Ht 66.0 in | Wt 138.0 lb

## 2022-10-01 DIAGNOSIS — Z79631 Long term (current) use of antimetabolite agent: Secondary | ICD-10-CM | POA: Insufficient documentation

## 2022-10-01 DIAGNOSIS — C182 Malignant neoplasm of ascending colon: Secondary | ICD-10-CM

## 2022-10-01 DIAGNOSIS — Z5112 Encounter for antineoplastic immunotherapy: Secondary | ICD-10-CM | POA: Diagnosis present

## 2022-10-01 DIAGNOSIS — Z79899 Other long term (current) drug therapy: Secondary | ICD-10-CM | POA: Diagnosis not present

## 2022-10-01 DIAGNOSIS — Z5111 Encounter for antineoplastic chemotherapy: Secondary | ICD-10-CM | POA: Diagnosis present

## 2022-10-01 DIAGNOSIS — R000 Tachycardia, unspecified: Secondary | ICD-10-CM | POA: Insufficient documentation

## 2022-10-01 DIAGNOSIS — K3580 Unspecified acute appendicitis: Secondary | ICD-10-CM | POA: Diagnosis not present

## 2022-10-01 DIAGNOSIS — Z79634 Long term (current) use of topoisomerase inhibitor: Secondary | ICD-10-CM | POA: Diagnosis not present

## 2022-10-01 DIAGNOSIS — I10 Essential (primary) hypertension: Secondary | ICD-10-CM | POA: Insufficient documentation

## 2022-10-01 DIAGNOSIS — Z853 Personal history of malignant neoplasm of breast: Secondary | ICD-10-CM | POA: Insufficient documentation

## 2022-10-01 DIAGNOSIS — C180 Malignant neoplasm of cecum: Secondary | ICD-10-CM | POA: Insufficient documentation

## 2022-10-01 DIAGNOSIS — C787 Secondary malignant neoplasm of liver and intrahepatic bile duct: Secondary | ICD-10-CM | POA: Insufficient documentation

## 2022-10-01 DIAGNOSIS — R059 Cough, unspecified: Secondary | ICD-10-CM | POA: Diagnosis not present

## 2022-10-01 DIAGNOSIS — Z95828 Presence of other vascular implants and grafts: Secondary | ICD-10-CM

## 2022-10-01 LAB — CBC WITH DIFFERENTIAL (CANCER CENTER ONLY)
Abs Immature Granulocytes: 0.34 10*3/uL — ABNORMAL HIGH (ref 0.00–0.07)
Basophils Absolute: 0.1 10*3/uL (ref 0.0–0.1)
Basophils Relative: 0 %
Eosinophils Absolute: 0 10*3/uL (ref 0.0–0.5)
Eosinophils Relative: 0 %
HCT: 31.7 % — ABNORMAL LOW (ref 36.0–46.0)
Hemoglobin: 10 g/dL — ABNORMAL LOW (ref 12.0–15.0)
Immature Granulocytes: 2 %
Lymphocytes Relative: 11 %
Lymphs Abs: 1.7 10*3/uL (ref 0.7–4.0)
MCH: 26.6 pg (ref 26.0–34.0)
MCHC: 31.5 g/dL (ref 30.0–36.0)
MCV: 84.3 fL (ref 80.0–100.0)
Monocytes Absolute: 2.1 10*3/uL — ABNORMAL HIGH (ref 0.1–1.0)
Monocytes Relative: 13 %
Neutro Abs: 12.1 10*3/uL — ABNORMAL HIGH (ref 1.7–7.7)
Neutrophils Relative %: 74 %
Platelet Count: 240 10*3/uL (ref 150–400)
RBC: 3.76 MIL/uL — ABNORMAL LOW (ref 3.87–5.11)
RDW: 21.4 % — ABNORMAL HIGH (ref 11.5–15.5)
WBC Count: 16.7 10*3/uL — ABNORMAL HIGH (ref 4.0–10.5)
nRBC: 0 % (ref 0.0–0.2)

## 2022-10-01 LAB — CMP (CANCER CENTER ONLY)
ALT: 16 U/L (ref 0–44)
AST: 55 U/L — ABNORMAL HIGH (ref 15–41)
Albumin: 3.3 g/dL — ABNORMAL LOW (ref 3.5–5.0)
Alkaline Phosphatase: 166 U/L — ABNORMAL HIGH (ref 38–126)
Anion gap: 11 (ref 5–15)
BUN: 12 mg/dL (ref 8–23)
CO2: 23 mmol/L (ref 22–32)
Calcium: 8.8 mg/dL — ABNORMAL LOW (ref 8.9–10.3)
Chloride: 103 mmol/L (ref 98–111)
Creatinine: 0.84 mg/dL (ref 0.44–1.00)
GFR, Estimated: >60 mL/min (ref >60)
Glucose, Bld: 117 mg/dL — ABNORMAL HIGH (ref 70–99)
Potassium: 4.3 mmol/L (ref 3.5–5.1)
Sodium: 137 mmol/L (ref 135–145)
Total Bilirubin: 0.9 mg/dL (ref 0.3–1.2)
Total Protein: 6.1 g/dL — ABNORMAL LOW (ref 6.5–8.1)

## 2022-10-01 MED ORDER — HEPARIN SOD (PORK) LOCK FLUSH 100 UNIT/ML IV SOLN
500.0000 [IU] | Freq: Once | INTRAVENOUS | Status: AC
  Administered 2022-10-01: 500 [IU] via INTRAVENOUS

## 2022-10-01 MED ORDER — SODIUM CHLORIDE 0.9 % IV SOLN: INTRAVENOUS | Status: DC

## 2022-10-01 MED ORDER — SODIUM CHLORIDE 0.9% FLUSH
10.0000 mL | INTRAVENOUS | Status: DC | PRN
  Administered 2022-10-01: 10 mL via INTRAVENOUS

## 2022-10-01 NOTE — Patient Instructions (Signed)

## 2022-10-01 NOTE — Progress Notes (Signed)
DISCONTINUE ON PATHWAY REGIMEN - Colorectal     A cycle is every 14 days:     Oxaliplatin      Leucovorin      Fluorouracil      Fluorouracil   **Always confirm dose/schedule in your pharmacy ordering system**  REASON: Disease Progression PRIOR TREATMENT: [id number]: mFOLFOX6 q14 Days TREATMENT RESPONSE: Progressive Disease (PD)  START ON PATHWAY REGIMEN - Colorectal     A cycle is every 14 days:     Bevacizumab-xxxx      Irinotecan      Leucovorin      Fluorouracil      Fluorouracil   **Always confirm dose/schedule in your pharmacy ordering system**  Patient Characteristics: Distant Metastases, Nonsurgical Candidate, KRAS/NRAS Mutation Positive/Unknown (BRAF V600 Wild-Type/Unknown), Standard Cytotoxic Therapy, Second Line Standard Cytotoxic Therapy, Bevacizumab Eligible Tumor Location: Colon Therapeutic Status: Distant Metastases Microsatellite/Mismatch Repair Status: MSS/pMMR BRAF Mutation Status: Wild-Type (no mutation) KRAS/NRAS Mutation Status: Mutation Positive Preferred Therapy Approach: Standard Cytotoxic Therapy Standard Cytotoxic Line of Therapy: Second Line Standard Cytotoxic Therapy Bevacizumab Eligibility: Eligible Intent of Therapy: Non-Curative / Palliative Intent, Discussed with Patient

## 2022-10-01 NOTE — Patient Instructions (Signed)

## 2022-10-01 NOTE — Progress Notes (Signed)
[facility] OFFICE PROGRESS NOTE   Diagnosis: Colon cancer  INTERVAL HISTORY:   Ms. Bjornsen returns as scheduled.  She has completed 4 cycles of FOLFOX.  Cycle 5 was held last week due to poor performance status.  She denies nausea/eating.  No diarrhea.  No shortness of breath.  No chest pain.  No fever.  Occasional cough.  Objective:  Vital signs in last 24 hours:  Blood pressure (!) 95/58, pulse (!) 120, temperature 98.1 F (36.7 C), temperature source Oral, resp. rate 18, height 5' 6"$  (1.676 m), weight 138 lb (62.6 kg), SpO2 100 %.    HEENT: Tongue appears dry.  Mild white coating over tongue.  No ulcers. Resp: Lungs clear bilaterally. Cardio: Regular, tachycardic. GI: Abdomen soft and nontender.  No hepatomegaly.  No mass. Vascular: No leg edema. Skin: Skin turgor intact. Port-A-Cath without erythema.  Lab Results:  Lab Results  Component Value Date   WBC 17.1 (H) 09/22/2022   HGB 10.6 (L) 09/22/2022   HCT 33.6 (L) 09/22/2022   MCV 83.2 09/22/2022   PLT 120 (L) 09/22/2022   NEUTROABS 11.6 (H) 09/22/2022    Imaging:  CT Abdomen Pelvis W Contrast  Result Date: 10/01/2022 CLINICAL DATA:  Colon cancer, assess treatment response. * Tracking Code: BO * EXAM: CT ABDOMEN AND PELVIS WITH CONTRAST TECHNIQUE: Multidetector CT imaging of the abdomen and pelvis was performed using the standard protocol following bolus administration of intravenous contrast. RADIATION DOSE REDUCTION: This exam was performed according to the departmental dose-optimization program which includes automated exposure control, adjustment of the mA and/or kV according to patient size and/or use of iterative reconstruction technique. CONTRAST:  174m OMNIPAQUE IOHEXOL 300 MG/ML  SOLN COMPARISON:  06/22/2022. FINDINGS: Lower chest: No acute findings. Heart size normal. No pericardial or pleural effusion. Distal esophagus is grossly unremarkable. Hepatobiliary: Heterogeneous nodules and masses  throughout the liver are enlarged or new in the interval. Index mass in the inferior right hepatic lobe measures 4.4 x 7.9 cm (2/35), previously 2.9 x 5.2 cm. Gallbladder is unremarkable. No biliary ductal dilatation. Pancreas: Negative. Spleen: Negative. Adrenals/Urinary Tract: Adrenal glands are unremarkable. Low-attenuation lesions in the kidneys measure up to 3.9 cm on the right. No specific follow-up necessary. Kidneys are otherwise unremarkable. Ureters are decompressed. Bladder is grossly unremarkable. Stomach/Bowel: Stomach and small bowel are grossly unremarkable. Masslike cecal wall thickening, difficult to measure but grossly similar to 06/22/2022. Colon is otherwise unremarkable. Vascular/Lymphatic: Vascular structures are unremarkable. Ileocolic mesenteric lymph nodes measure up to 12 mm (2/64), previously 1.5 cm. No additional pathologically enlarged lymph nodes. Reproductive: Hysterectomy.  No adnexal mass. Other: No free fluid. Elevated left hemidiaphragm. Mesenteries and peritoneum are otherwise unremarkable. Musculoskeletal: Degenerative changes in the spine. IMPRESSION: Primary cecal mass appears grossly similar to 06/22/2022. Adjacent ileocolic mesenteric lymph nodes are minimally smaller. Unfortunately, hepatic metastatic disease has progressed. Electronically Signed   By: MLorin PicketM.D.   On: 10/01/2022 11:59    Medications: I have reviewed the patient's current medications.  Assessment/Plan: Metastatic colon cancer CT abdomen/pelvis 06/22/2022, interval appendectomy, extensive interstitial and extraluminal gas surrounding the surgical staples at the resection margin, extensive cecal wall and terminal ileum thickening, pathologic adenopathy in the ileocolic lymph nodes, interval development of by lobar hypoenhancing hepatic masses Ultrasound-guided biopsy of left liver mass 06/28/2022-static adenocarcinoma, CDX2 and CK20 positive; microsatellite stable, tumor mutation burden 4,  K-ras G13D Colonoscopy 07/19/2022-malignant tumor in the cecum-moderately differentiated adenocarcinoma with mucinous features and background of high-grade dysplasia, ileocecal valve  polyp-negative for malignancy Cycle 1 FOLFOX 07/28/2022 Cycle 2 FOLFOX 08/11/2022, Udenyca Cycle 3 FOLFOX 08/25/2022, Udenyca Cycle 4 FOLFOX 09/09/2022, Udenyca CTs abdomen/pelvis 10/01/2022-heterogeneous nodules and masses throughout the liver are enlarged or new in the interval.  Index mass in the inferior right hepatic lobe measures 4.4 x 7.9 cm, previously 2.9 x 5.2 cm.  Primary cecal mass appears grossly similar; adjacent ileocolic mesenteric lymph nodes are minimally smaller. 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  Right breast cancer 08/28/2015-pT2pN0, stage IIa invasive ductal carcinoma, grade 1, HER2 negative, ER positive, PR positive-adjuvant radiation and anastrozole 12/29/2015-May 2022 4.  Left breast cancer 08/28/2015,pT1cpN0, stage Ia invasive ductal carcinoma, grade 1, HER2 negative, ER positive, PR positive-adjuvant radiation, anastrozole 12/29/2015-May 2022   5.  Hypertension   6.  Upper abdominal pain-likely secondary to metastatic disease involving the liver and the cecal tumor   7.  Port-A-Cath placement 07/23/2022    Disposition: Ms. Williston appears stable.  She has completed 4 cycles of FOLFOX chemotherapy.  Restaging CTs show evidence of disease progression.  CT report/images reviewed with Mr. Temkin and her daughter at today's visit.  Dr. Benay Spice recommends changing treatment to FOLFIRI/Avastin.  We reviewed potential side effects associated with irinotecan including hair loss, bone marrow toxicity, nausea, diarrhea.  We reviewed potential toxicities associate with Avastin  including bleeding, delayed wound healing, hypertension, proteinuria, wound dehiscence, bowel perforation, increased risk of blood clots and strokes,  posterior reversible encephalopathy syndrome.  She agrees to proceed.  She has mild hypotension and is tachycardic.  She is likely mildly dehydrated.  She will discontinue blood pressure medications.  She is receiving IV fluids.  She will return for cycle 1 FOLFIRI/Avastin next week.  We will see her prior to treatment.  Patient seen with Dr. Benay Spice.    Ned Card ANP/GNP-BC   10/01/2022  3:13 PM This was a shared visit with Ned Card.  Ms. Tiana Loft was interviewed and examined.  We reviewed the CT findings and images with Ms. Graig and her daughter.  There is CT evidence of significant disease progression in the liver.  We discussed treatment options.  The tumor has a RAS mutation.  She will not be a candidate for an EGFR inhibitor or immunotherapy.  I recommend changing to FOLFIRI/bevacizumab.  We reviewed potential toxicities associated with this regimen.  She agrees to proceed.  A chemotherapy plan was entered today.  I was present for greater than 50% of today's visit.  I performed medical decision making.  Julieanne Manson, MD

## 2022-10-03 ENCOUNTER — Other Ambulatory Visit: Payer: Self-pay | Admitting: Oncology

## 2022-10-06 ENCOUNTER — Encounter: Payer: Self-pay | Admitting: Nurse Practitioner

## 2022-10-06 ENCOUNTER — Inpatient Hospital Stay: Payer: Medicare Other

## 2022-10-06 ENCOUNTER — Inpatient Hospital Stay (HOSPITAL_BASED_OUTPATIENT_CLINIC_OR_DEPARTMENT_OTHER): Payer: Medicare Other | Admitting: Nurse Practitioner

## 2022-10-06 ENCOUNTER — Other Ambulatory Visit: Payer: Self-pay

## 2022-10-06 VITALS — BP 106/76 | HR 100 | Temp 98.1°F | Resp 18 | Ht 66.0 in | Wt 135.0 lb

## 2022-10-06 DIAGNOSIS — C182 Malignant neoplasm of ascending colon: Secondary | ICD-10-CM | POA: Diagnosis not present

## 2022-10-06 DIAGNOSIS — Z5112 Encounter for antineoplastic immunotherapy: Secondary | ICD-10-CM | POA: Diagnosis not present

## 2022-10-06 LAB — CBC WITH DIFFERENTIAL (CANCER CENTER ONLY)
Abs Immature Granulocytes: 0.41 10*3/uL — ABNORMAL HIGH (ref 0.00–0.07)
Basophils Absolute: 0.1 10*3/uL (ref 0.0–0.1)
Basophils Relative: 1 %
Eosinophils Absolute: 0 10*3/uL (ref 0.0–0.5)
Eosinophils Relative: 0 %
HCT: 29.6 % — ABNORMAL LOW (ref 36.0–46.0)
Hemoglobin: 9.5 g/dL — ABNORMAL LOW (ref 12.0–15.0)
Immature Granulocytes: 3 %
Lymphocytes Relative: 16 %
Lymphs Abs: 2.4 10*3/uL (ref 0.7–4.0)
MCH: 26.8 pg (ref 26.0–34.0)
MCHC: 32.1 g/dL (ref 30.0–36.0)
MCV: 83.6 fL (ref 80.0–100.0)
Monocytes Absolute: 1.5 10*3/uL — ABNORMAL HIGH (ref 0.1–1.0)
Monocytes Relative: 10 %
Neutro Abs: 10.1 10*3/uL — ABNORMAL HIGH (ref 1.7–7.7)
Neutrophils Relative %: 70 %
Platelet Count: 263 10*3/uL (ref 150–400)
RBC: 3.54 MIL/uL — ABNORMAL LOW (ref 3.87–5.11)
RDW: 20.4 % — ABNORMAL HIGH (ref 11.5–15.5)
WBC Count: 14.5 10*3/uL — ABNORMAL HIGH (ref 4.0–10.5)
nRBC: 0 % (ref 0.0–0.2)

## 2022-10-06 LAB — CMP (CANCER CENTER ONLY)
ALT: 19 U/L (ref 0–44)
AST: 41 U/L (ref 15–41)
Albumin: 3.2 g/dL — ABNORMAL LOW (ref 3.5–5.0)
Alkaline Phosphatase: 201 U/L — ABNORMAL HIGH (ref 38–126)
Anion gap: 10 (ref 5–15)
BUN: 7 mg/dL — ABNORMAL LOW (ref 8–23)
CO2: 22 mmol/L (ref 22–32)
Calcium: 8.8 mg/dL — ABNORMAL LOW (ref 8.9–10.3)
Chloride: 102 mmol/L (ref 98–111)
Creatinine: 0.76 mg/dL (ref 0.44–1.00)
GFR, Estimated: >60 mL/min (ref >60)
Glucose, Bld: 93 mg/dL (ref 70–99)
Potassium: 4.4 mmol/L (ref 3.5–5.1)
Sodium: 134 mmol/L — ABNORMAL LOW (ref 135–145)
Total Bilirubin: 0.9 mg/dL (ref 0.3–1.2)
Total Protein: 6.3 g/dL — ABNORMAL LOW (ref 6.5–8.1)

## 2022-10-06 LAB — URINALYSIS, COMPLETE (UACMP) WITH MICROSCOPIC
Bilirubin Urine: NEGATIVE
Glucose, UA: NEGATIVE mg/dL
Nitrite: NEGATIVE
Protein, ur: >300 mg/dL — AB
Specific Gravity, Urine: 1.025 (ref 1.005–1.030)
pH: 6 (ref 5.0–8.0)

## 2022-10-06 LAB — TOTAL PROTEIN, URINE DIPSTICK: Protein, ur: >300 mg/dL — AB

## 2022-10-06 LAB — CEA (ACCESS): CEA (CHCC): 3294.33 ng/mL — ABNORMAL HIGH (ref 0.00–5.00)

## 2022-10-06 NOTE — Progress Notes (Signed)
  [facility] OFFICE PROGRESS NOTE   Diagnosis: Colon cancer  INTERVAL HISTORY:   Christina [name] returns as scheduled.  She feels better.  Appetite is improving.  Overall good fluid intake.  No nausea or vomiting.  No diarrhea.  No fever.  No cough or shortness of breath.  No leg swelling or calf pain.  Objective:  Vital signs in last 24 hours:  Blood pressure 106/76, pulse 100, temperature 98.1 F (36.7 C), resp. rate 18, height '5\' 6"'$  (1.676 m), weight 135 lb (61.2 kg), SpO2 100 %.    HEENT: No thrush or ulcers.  Mucous membranes are moist. Resp: Lungs clear bilaterally. Cardio: Regular rate and rhythm. GI: Abdomen soft and nontender.  No hepatomegaly. Vascular: No leg edema. Neuro: Alert and oriented. Skin: Skin turgor intact. Port-A-Cath without erythema.   Lab Results:  Lab Results  Component Value Date   WBC 14.5 (H) 10/06/2022   HGB 9.5 (L) 10/06/2022   HCT 29.6 (L) 10/06/2022   MCV 83.6 10/06/2022   PLT 263 10/06/2022   NEUTROABS 10.1 (H) 10/06/2022    Imaging:  No results found.  Medications: I have reviewed the patient's current medications.  Assessment/Plan: Metastatic colon cancer CT abdomen/pelvis 06/22/2022, interval appendectomy, extensive interstitial and extraluminal gas surrounding the surgical staples at the resection margin, extensive cecal wall and terminal ileum thickening, pathologic adenopathy in the ileocolic lymph nodes, interval development of by lobar hypoenhancing hepatic masses Ultrasound-guided biopsy of left liver mass 06/28/2022-static adenocarcinoma, CDX2 and CK20 positive; microsatellite stable, tumor mutation burden 4, K-ras G13D Colonoscopy 07/19/2022-malignant tumor in the cecum-moderately differentiated adenocarcinoma with mucinous features and background of high-grade dysplasia, ileocecal valve polyp-negative for malignancy Cycle 1 FOLFOX 07/28/2022 Cycle 2 FOLFOX 08/11/2022, Udenyca Cycle 3 FOLFOX 08/25/2022,  Udenyca Cycle 4 FOLFOX 09/09/2022, Udenyca CTs abdomen/pelvis 10/01/2022-heterogeneous nodules and masses throughout the liver are enlarged or new in the interval.  Index mass in the inferior right hepatic lobe measures 4.4 x 7.9 cm, previously 2.9 x 5.2 cm.  Primary cecal mass appears grossly similar; adjacent ileocolic mesenteric lymph nodes are minimally smaller. Cycle 1 FOLFIRI/Avastin 10/06/2022 Low-grade appendiceal mucinous neoplasm 03/03/2022 Appendectomy with neoplastic changes in the epithelium at the appendiceal margin of resection, gangrenous appendicitis, no mucinous deposits on appendiceal serosa, no neoplastic infiltration of the submucosa and muscularis 3.  Right breast cancer 08/28/2015-pT2pN0, stage IIa invasive ductal carcinoma, grade 1, HER2 negative, ER positive, PR positive-adjuvant radiation and anastrozole 12/29/2015-May 2022 4.  Left breast cancer 08/28/2015,pT1cpN0, stage Ia invasive ductal carcinoma, grade 1, HER2 negative, ER positive, PR positive-adjuvant radiation, anastrozole 12/29/2015-May 2022   5.  Hypertension   6.  Upper abdominal pain-likely secondary to metastatic disease involving the liver and the cecal tumor   7.  Port-A-Cath placement 07/23/2022    Disposition: Christina [name] appears stable.  She is scheduled to begin treatment tomorrow with FOLFIRI/Avastin.  We again reviewed potential toxicities.  She agrees to proceed.  CBC and chemistry panel reviewed.  Labs adequate to proceed as above.  She will return for lab, follow-up, cycle 2 FOLFIRI/Avastin in 2 weeks.  We are available to see her sooner if needed.    Ned Card ANP/GNP-BC   10/06/2022  2:56 PM

## 2022-10-07 ENCOUNTER — Inpatient Hospital Stay: Payer: Medicare Other

## 2022-10-07 ENCOUNTER — Other Ambulatory Visit: Payer: Medicare Other

## 2022-10-07 ENCOUNTER — Other Ambulatory Visit (HOSPITAL_COMMUNITY): Payer: Self-pay

## 2022-10-07 ENCOUNTER — Other Ambulatory Visit: Payer: Self-pay

## 2022-10-07 ENCOUNTER — Encounter: Payer: Self-pay | Admitting: Oncology

## 2022-10-07 ENCOUNTER — Other Ambulatory Visit: Payer: Self-pay | Admitting: Nurse Practitioner

## 2022-10-07 VITALS — BP 108/73 | HR 70 | Temp 98.2°F | Resp 18

## 2022-10-07 DIAGNOSIS — Z5112 Encounter for antineoplastic immunotherapy: Secondary | ICD-10-CM | POA: Diagnosis not present

## 2022-10-07 DIAGNOSIS — C182 Malignant neoplasm of ascending colon: Secondary | ICD-10-CM

## 2022-10-07 LAB — URINALYSIS, COMPLETE (UACMP) WITH MICROSCOPIC
Bilirubin Urine: NEGATIVE
Glucose, UA: NEGATIVE mg/dL
Ketones, ur: NEGATIVE mg/dL
Nitrite: NEGATIVE
Protein, ur: >300 mg/dL — AB
Specific Gravity, Urine: 1.022 (ref 1.005–1.030)
pH: 6 (ref 5.0–8.0)

## 2022-10-07 LAB — TOTAL PROTEIN, URINE DIPSTICK: Protein, ur: >300 mg/dL — AB

## 2022-10-07 MED ORDER — SODIUM CHLORIDE 0.9 % IV SOLN
2400.0000 mg/m2 | INTRAVENOUS | Status: DC
  Administered 2022-10-07: 4100 mg via INTRAVENOUS
  Filled 2022-10-07: qty 82

## 2022-10-07 MED ORDER — SODIUM CHLORIDE 0.9 % IV SOLN
150.0000 mg | Freq: Once | INTRAVENOUS | Status: AC
  Administered 2022-10-07: 150 mg via INTRAVENOUS
  Filled 2022-10-07: qty 150

## 2022-10-07 MED ORDER — SODIUM CHLORIDE 0.9 % IV SOLN
5.0000 mg/kg | Freq: Once | INTRAVENOUS | Status: AC
  Administered 2022-10-07: 300 mg via INTRAVENOUS
  Filled 2022-10-07: qty 12

## 2022-10-07 MED ORDER — ATROPINE SULFATE 1 MG/ML IV SOLN
0.5000 mg | Freq: Once | INTRAVENOUS | Status: AC | PRN
  Administered 2022-10-07: 0.5 mg via INTRAVENOUS
  Filled 2022-10-07: qty 1

## 2022-10-07 MED ORDER — SODIUM CHLORIDE 0.9 % IV SOLN
10.0000 mg | Freq: Once | INTRAVENOUS | Status: AC
  Administered 2022-10-07: 10 mg via INTRAVENOUS
  Filled 2022-10-07: qty 10

## 2022-10-07 MED ORDER — SODIUM CHLORIDE 0.9 % IV SOLN
180.0000 mg/m2 | Freq: Once | INTRAVENOUS | Status: AC
  Administered 2022-10-07: 300 mg via INTRAVENOUS
  Filled 2022-10-07: qty 15

## 2022-10-07 MED ORDER — FLUOROURACIL CHEMO INJECTION 2.5 GM/50ML
400.0000 mg/m2 | Freq: Once | INTRAVENOUS | Status: AC
  Administered 2022-10-07: 700 mg via INTRAVENOUS
  Filled 2022-10-07: qty 14

## 2022-10-07 MED ORDER — SODIUM CHLORIDE 0.9 % IV SOLN: Freq: Once | INTRAVENOUS | Status: AC

## 2022-10-07 MED ORDER — PROCHLORPERAZINE EDISYLATE 10 MG/2ML IJ SOLN
10.0000 mg | Freq: Once | INTRAMUSCULAR | Status: AC
  Administered 2022-10-07: 10 mg via INTRAVENOUS
  Filled 2022-10-07: qty 2

## 2022-10-07 MED ORDER — SODIUM CHLORIDE 0.9 % IV SOLN
400.0000 mg/m2 | Freq: Once | INTRAVENOUS | Status: AC
  Administered 2022-10-07: 684 mg via INTRAVENOUS
  Filled 2022-10-07: qty 34.2

## 2022-10-07 MED ORDER — AMOXICILLIN-POT CLAVULANATE 500-125 MG PO TABS
1.0000 | ORAL_TABLET | Freq: Two times a day (BID) | ORAL | 0 refills | Status: AC
  Filled 2022-10-07: qty 10, 5d supply, fill #0

## 2022-10-07 NOTE — Patient Instructions (Addendum)
The chemotherapy medication bag should finish at 46 hours, 96 hours, or 7 days. For example, if your pump is scheduled for 46 hours and it was put on at 4:00 p.m., it should finish at 2:00 p.m. the day it is scheduled to come off regardless of your appointment time.     Estimated time to finish at 1:15pm on Saturday 10/09/22 at [facility].   If the display on your pump reads "Low Volume" and it is beeping, take the batteries out of the pump and come to the cancer center for it to be taken off.   If the pump alarms go off prior to the pump reading "Low Volume" then call [phone] and someone can assist you.  If the plunger comes out and the chemotherapy medication is leaking out, please use your home chemo spill kit to clean up the spill. Do NOT use paper towels or other household products.  If you have problems or questions regarding your pump, please call either [phone] (24 hours a day) or the cancer center Monday-Friday 8:00 a.m.- 4:30 p.m. at the clinic number and we will assist you. If you are unable to get assistance, then go to the nearest Emergency Department and ask the staff to contact the IV team for assistance.   [facility]  Discharge Instructions: Thank you for choosing [facility] to provide your oncology and hematology care.   If you have a lab appointment with the [facility], please go directly to the [facility] and check in at the registration area.   Wear comfortable clothing and clothing appropriate for easy access to any Portacath or PICC line.   We strive to give you quality time with your provider. You may need to reschedule your appointment if you arrive late (15 or more minutes).  Arriving late affects you and other patients whose appointments are after yours.  Also, if you miss three or more appointments without notifying the office, you may be dismissed from the clinic at the provider's  discretion.      For prescription refill requests, have your pharmacy contact our office and allow 72 hours for refills to be completed.    Today you received the following chemotherapy and/or immunotherapy agents: irinotecan, leucovorin, fluorouracil      To help prevent nausea and vomiting after your treatment, we encourage you to take your nausea medication as directed.  BELOW ARE SYMPTOMS THAT SHOULD BE REPORTED IMMEDIATELY: *FEVER GREATER THAN 100.4 F (38 C) OR HIGHER *CHILLS OR SWEATING *NAUSEA AND VOMITING THAT IS NOT CONTROLLED WITH YOUR NAUSEA MEDICATION *UNUSUAL SHORTNESS OF BREATH *UNUSUAL BRUISING OR BLEEDING *URINARY PROBLEMS (pain or burning when urinating, or frequent urination) *BOWEL PROBLEMS (unusual diarrhea, constipation, pain near the anus) TENDERNESS IN MOUTH AND THROAT WITH OR WITHOUT PRESENCE OF ULCERS (sore throat, sores in mouth, or a toothache) UNUSUAL RASH, SWELLING OR PAIN  UNUSUAL VAGINAL DISCHARGE OR ITCHING   Items with * indicate a potential emergency and should be followed up as soon as possible or go to the Emergency Department if any problems should occur.  Please show the CHEMOTHERAPY ALERT CARD or IMMUNOTHERAPY ALERT CARD at check-in to the Emergency Department and triage nurse.  Should you have questions after your visit or need to cancel or reschedule your appointment, please contact [facility]  Dept: [phone]  and follow the prompts.  Office hours are 8:00 a.m. to 4:30 p.m. Monday -  Friday. Please note that voicemails left after 4:00 p.m. may not be returned until the following business day.  We are closed weekends and major holidays. You have access to a nurse at all times for urgent questions. Please call the main number to the clinic Dept: [phone] and follow the prompts.   For any non-urgent questions, you may also contact your provider using MyChart. We now offer e-Visits for anyone 91 and older to  request care online for non-urgent symptoms. For details visit [email or web address].   Also download the MyChart app! Go to the app store, search "MyChart", open the app, select [facility], and log in with your MyChart username and password.  Irinotecan Injection What is this medication? IRINOTECAN (ir in oh TEE kan) treats some types of cancer. It works by slowing down the growth of cancer cells. This medicine may be used for other purposes; ask your health care provider or pharmacist if you have questions. COMMON BRAND NAME(S): Camptosar What should I tell my care team before I take this medication? They need to know if you have any of these conditions: Dehydration Diarrhea Infection, especially a viral infection, such as chickenpox, cold sores, herpes Liver disease Low blood cell levels (white cells, red cells, and platelets) Low levels of electrolytes, such as calcium, magnesium, or potassium in your blood Recent or ongoing radiation An unusual or allergic reaction to irinotecan, other medications, foods, dyes, or preservatives If you or your partner are pregnant or trying to get pregnant Breast-feeding How should I use this medication? This medication is injected into a vein. It is given by your care team in a hospital or clinic setting. Talk to your care team about the use of this medication in children. Special care may be needed. Overdosage: If you think you have taken too much of this medicine contact a poison control center or emergency room at once. NOTE: This medicine is only for you. Do not share this medicine with others. What if I miss a dose? Keep appointments for follow-up doses. It is important not to miss your dose. Call your care team if you are unable to keep an appointment. What may interact with this medication? Do not take this medication with any of the following: Cobicistat Itraconazole This medication may also interact with the following: Certain  antibiotics, such as clarithromycin, rifampin, rifabutin Certain antivirals for HIV or AIDS Certain medications for fungal infections, such as ketoconazole, posaconazole, voriconazole Certain medications for seizures, such as carbamazepine, phenobarbital, phenytoin Gemfibrozil Nefazodone St. John's wort This list may not describe all possible interactions. Give your health care provider a list of all the medicines, herbs, non-prescription drugs, or dietary supplements you use. Also tell them if you smoke, drink alcohol, or use illegal drugs. Some items may interact with your medicine. What should I watch for while using this medication? Your condition will be monitored carefully while you are receiving this medication. You may need blood work while taking this medication. This medication may make you feel generally unwell. This is not uncommon as chemotherapy can affect healthy cells as well as cancer cells. Report any side effects. Continue your course of treatment even though you feel ill unless your care team tells you to stop. This medication can cause serious side effects. To reduce the risk, your care team may give you other medications to take before receiving this one. Be sure to follow the directions from your care team. This medication may affect your coordination, reaction time, or judgement. Do  not drive or operate machinery until you know how this medication affects you. Sit up or stand slowly to reduce the risk of dizzy or fainting spells. Drinking alcohol with this medication can increase the risk of these side effects. This medication may increase your risk of getting an infection. Call your care team for advice if you get a fever, chills, sore throat, or other symptoms of a cold or flu. Do not treat yourself. Try to avoid being around people who are sick. Avoid taking medications that contain aspirin, acetaminophen, ibuprofen, naproxen, or ketoprofen unless instructed by your care  team. These medications may hide a fever. This medication may increase your risk to bruise or bleed. Call your care team if you notice any unusual bleeding. Be careful brushing or flossing your teeth or using a toothpick because you may get an infection or bleed more easily. If you have any dental work done, tell your dentist you are receiving this medication. Talk to your care team if you or your partner are pregnant or think either of you might be pregnant. This medication can cause serious birth defects if taken during pregnancy and for 6 months after the last dose. You will need a negative pregnancy test before starting this medication. Contraception is recommended while taking this medication and for 6 months after the last dose. Your care team can help you find the option that works for you. Do not father a child while taking this medication and for 3 months after the last dose. Use a condom for contraception during this time period. Do not breastfeed while taking this medication and for 7 days after the last dose. This medication may cause infertility. Talk to your care team if you are concerned about your fertility. What side effects may I notice from receiving this medication? Side effects that you should report to your care team as soon as possible: Allergic reactions--skin rash, itching, hives, swelling of the face, lips, tongue, or throat Dry cough, shortness of breath or trouble breathing Increased saliva or tears, increased sweating, stomach cramping, diarrhea, small pupils, unusual weakness or fatigue, slow heartbeat Infection--fever, chills, cough, sore throat, wounds that don't heal, pain or trouble when passing urine, general feeling of discomfort or being unwell Kidney injury--decrease in the amount of urine, swelling of the ankles, hands, or feet Low red blood cell level--unusual weakness or fatigue, dizziness, headache, trouble breathing Severe or prolonged diarrhea Unusual  bruising or bleeding Side effects that usually do not require medical attention (report to your care team if they continue or are bothersome): Constipation Diarrhea Hair loss Loss of appetite Nausea Stomach pain This list may not describe all possible side effects. Call your doctor for medical advice about side effects. You may report side effects to FDA at 1-800-FDA-1088. Where should I keep my medication? This medication is given in a hospital or clinic. It will not be stored at home. NOTE: This sheet is a summary. It may not cover all possible information. If you have questions about this medicine, talk to your doctor, pharmacist, or health care provider.  2023 Elsevier/Gold Standard (2021-12-17 00:00:00)

## 2022-10-07 NOTE — Progress Notes (Signed)
Urine Protein >300. Dr. Benay Spice made aware, per Dr. Benay Spice, please check urinalysis, and ok to proceed with premedications pending results.  Order entered for urinalysis.   Per Ned Card, NP, ok to proceed with bevacizumab, urine "has a lot of white cells in urine today, I will send a prescription to her pharmacy".   Patient made aware and verbalized understanding.   1400--patient complained of stomach cramping and feeling of having to have an urgent bowel movement.  Irinotecan and leucovorin infusion stopped, 10 mL NS flushed, atropine given (see MAR), 10 mL NS flushed.  Infusion restarted. Patient noted immediate relief of stomach cramping.

## 2022-10-07 NOTE — Telephone Encounter (Signed)
I called Christina [name] to do reminder of her appointment with lab/flush/lisa.

## 2022-10-08 ENCOUNTER — Telehealth: Payer: Self-pay

## 2022-10-08 NOTE — Telephone Encounter (Signed)
Called and spoke to patient and patient's daughter [name] to check on patient after receiving irinotecan and bevacizumab for the first time.  Patient stated she has felt ok, just has noted some loose stools.  Instructed daughter and patient to have patient take immodium for the loose stools.  Patient also stated she noted loose stools with the antibiotic.  Instructed patient to take the antibiotic with food.   Patient and daughter both verbalized understanding and agreed to call our office if they had any additional questions or concerns.  All questions were answered during phone call.

## 2022-10-09 ENCOUNTER — Inpatient Hospital Stay: Payer: Medicare Other

## 2022-10-09 VITALS — BP 131/85 | HR 83 | Temp 97.5°F | Resp 16

## 2022-10-09 DIAGNOSIS — Z5112 Encounter for antineoplastic immunotherapy: Secondary | ICD-10-CM | POA: Diagnosis not present

## 2022-10-09 DIAGNOSIS — C182 Malignant neoplasm of ascending colon: Secondary | ICD-10-CM

## 2022-10-09 MED ORDER — HEPARIN SOD (PORK) LOCK FLUSH 100 UNIT/ML IV SOLN
500.0000 [IU] | Freq: Once | INTRAVENOUS | Status: AC | PRN
  Administered 2022-10-09: 500 [IU]

## 2022-10-09 MED ORDER — SODIUM CHLORIDE 0.9% FLUSH
10.0000 mL | INTRAVENOUS | Status: DC | PRN
  Administered 2022-10-09: 10 mL

## 2022-10-17 ENCOUNTER — Other Ambulatory Visit: Payer: Self-pay | Admitting: Oncology

## 2022-10-20 ENCOUNTER — Inpatient Hospital Stay: Payer: Medicare Other

## 2022-10-20 ENCOUNTER — Inpatient Hospital Stay: Payer: Medicare Other | Admitting: Oncology

## 2022-10-20 ENCOUNTER — Inpatient Hospital Stay: Payer: Medicare Other | Admitting: Nutrition

## 2022-10-21 ENCOUNTER — Inpatient Hospital Stay (HOSPITAL_BASED_OUTPATIENT_CLINIC_OR_DEPARTMENT_OTHER): Payer: Medicare Other | Admitting: Nurse Practitioner

## 2022-10-21 ENCOUNTER — Encounter: Payer: Self-pay | Admitting: *Deleted

## 2022-10-21 ENCOUNTER — Inpatient Hospital Stay: Payer: Medicare Other

## 2022-10-21 ENCOUNTER — Telehealth: Payer: Self-pay

## 2022-10-21 ENCOUNTER — Other Ambulatory Visit (HOSPITAL_BASED_OUTPATIENT_CLINIC_OR_DEPARTMENT_OTHER): Payer: Self-pay

## 2022-10-21 ENCOUNTER — Encounter: Payer: Self-pay | Admitting: Nurse Practitioner

## 2022-10-21 VITALS — BP 103/79 | HR 84 | Temp 98.1°F | Resp 18 | Ht 66.0 in | Wt 126.2 lb

## 2022-10-21 VITALS — BP 116/75 | HR 81 | Resp 18

## 2022-10-21 DIAGNOSIS — E876 Hypokalemia: Secondary | ICD-10-CM | POA: Diagnosis not present

## 2022-10-21 DIAGNOSIS — C182 Malignant neoplasm of ascending colon: Secondary | ICD-10-CM

## 2022-10-21 DIAGNOSIS — Z95828 Presence of other vascular implants and grafts: Secondary | ICD-10-CM

## 2022-10-21 DIAGNOSIS — Z5112 Encounter for antineoplastic immunotherapy: Secondary | ICD-10-CM | POA: Diagnosis not present

## 2022-10-21 LAB — CBC WITH DIFFERENTIAL (CANCER CENTER ONLY)
Abs Immature Granulocytes: 0 10*3/uL (ref 0.00–0.07)
Basophils Absolute: 0 10*3/uL (ref 0.0–0.1)
Basophils Relative: 1 %
Eosinophils Absolute: 0.2 10*3/uL (ref 0.0–0.5)
Eosinophils Relative: 9 %
HCT: 31 % — ABNORMAL LOW (ref 36.0–46.0)
Hemoglobin: 9.4 g/dL — ABNORMAL LOW (ref 12.0–15.0)
Immature Granulocytes: 0 %
Lymphocytes Relative: 54 %
Lymphs Abs: 1.1 10*3/uL (ref 0.7–4.0)
MCH: 26.5 pg (ref 26.0–34.0)
MCHC: 30.3 g/dL (ref 30.0–36.0)
MCV: 87.3 fL (ref 80.0–100.0)
Monocytes Absolute: 0.3 10*3/uL (ref 0.1–1.0)
Monocytes Relative: 16 %
Neutro Abs: 0.4 10*3/uL — CL (ref 1.7–7.7)
Neutrophils Relative %: 20 %
Platelet Count: 253 10*3/uL (ref 150–400)
RBC: 3.55 MIL/uL — ABNORMAL LOW (ref 3.87–5.11)
RDW: 19 % — ABNORMAL HIGH (ref 11.5–15.5)
WBC Count: 1.9 10*3/uL — ABNORMAL LOW (ref 4.0–10.5)
nRBC: 0 % (ref 0.0–0.2)

## 2022-10-21 LAB — CMP (CANCER CENTER ONLY)
ALT: 8 U/L (ref 0–44)
AST: 23 U/L (ref 15–41)
Albumin: 3.4 g/dL — ABNORMAL LOW (ref 3.5–5.0)
Alkaline Phosphatase: 171 U/L — ABNORMAL HIGH (ref 38–126)
Anion gap: 9 (ref 5–15)
BUN: 8 mg/dL (ref 8–23)
CO2: 23 mmol/L (ref 22–32)
Calcium: 9.2 mg/dL (ref 8.9–10.3)
Chloride: 108 mmol/L (ref 98–111)
Creatinine: 0.69 mg/dL (ref 0.44–1.00)
GFR, Estimated: >60 mL/min (ref >60)
Glucose, Bld: 92 mg/dL (ref 70–99)
Potassium: 3.4 mmol/L — ABNORMAL LOW (ref 3.5–5.1)
Sodium: 140 mmol/L (ref 135–145)
Total Bilirubin: 0.6 mg/dL (ref 0.3–1.2)
Total Protein: 6.7 g/dL (ref 6.5–8.1)

## 2022-10-21 MED ORDER — HEPARIN SOD (PORK) LOCK FLUSH 100 UNIT/ML IV SOLN
500.0000 [IU] | Freq: Once | INTRAVENOUS | Status: AC
  Administered 2022-10-21: 500 [IU] via INTRAVENOUS

## 2022-10-21 MED ORDER — SODIUM CHLORIDE 0.9% FLUSH
10.0000 mL | Freq: Once | INTRAVENOUS | Status: AC
  Administered 2022-10-21: 10 mL via INTRAVENOUS

## 2022-10-21 MED ORDER — POTASSIUM CHLORIDE 10 MEQ/100ML IV SOLN
10.0000 meq | INTRAVENOUS | Status: AC
  Administered 2022-10-21 (×2): 10 meq via INTRAVENOUS
  Filled 2022-10-21: qty 100

## 2022-10-21 MED ORDER — SODIUM CHLORIDE 0.9 % IV SOLN: Freq: Once | INTRAVENOUS | Status: AC

## 2022-10-21 MED ORDER — SODIUM CHLORIDE 0.9 % IV SOLN: INTRAVENOUS | Status: AC

## 2022-10-21 NOTE — Telephone Encounter (Signed)
Called lab to check on patient's Mo[place].  Verbal ANC of 0.4 received.  Ned Card, NP made aware. Chemo treatment to be held today.

## 2022-10-21 NOTE — Progress Notes (Signed)
Provided patient a case of Ensure (Vanilla). Does not like chocolate. Has appointment to see dietician on 10/27/22.

## 2022-10-21 NOTE — Patient Instructions (Addendum)
Hypokalemia Hypokalemia means that the amount of potassium in the blood is lower than normal. Potassium is a mineral (electrolyte) that helps regulate the amount of fluid in the body. It also stimulates muscle tightening (contraction) and helps nerves work properly. Normally, most of the body's potassium is inside cells, and only a very small amount is in the blood. Because the amount in the blood is so small, minor changes to potassium levels in the blood can be life-threatening. What are the causes? This condition may be caused by: Antibiotic medicine. Diarrhea or vomiting. Taking too much of a medicine that helps you have a bowel movement (laxative) can cause diarrhea and lead to hypokalemia. Chronic kidney disease (CKD). Medicines that help the body get rid of excess fluid (diuretics). Eating disorders, such as anorexia or bulimia. Low magnesium levels in the body. Sweating a lot. What are the signs or symptoms? Symptoms of this condition include: Weakness. Constipation. Fatigue. Muscle cramps. Mental confusion. Skipped heartbeats or irregular heartbeat (palpitations). Tingling or numbness. How is this diagnosed? This condition is diagnosed with a blood test. How is this treated? This condition may be treated by: Taking potassium supplements. Adjusting the medicines that you take. Eating more foods that contain a lot of potassium. If your potassium level is very low, you may need to get potassium through an IV and be monitored in the hospital. Follow these instructions at home: Eating and drinking  Eat a healthy diet. A healthy diet includes fresh fruits and vegetables, whole grains, healthy fats, and lean proteins. If told, eat more foods that contain a lot of potassium. These include: Nuts, such as peanuts and pistachios. Seeds, such as sunflower seeds and pumpkin seeds. Peas, lentils, and lima beans. Whole grain and bran cereals and breads. Fresh fruits and vegetables,  such as apricots, avocado, bananas, cantaloupe, kiwi, oranges, tomatoes, asparagus, and potatoes. Juices, such as orange, tomato, and prune. Lean meats, including fish. Milk and milk products, such as yogurt. General instructions Take over-the-counter and prescription medicines only as told by your health care provider. This includes vitamins, natural food products, and supplements. Keep all follow-up visits. This is important. Contact a health care provider if: You have weakness that gets worse. You feel your heart pounding or racing. You vomit. You have diarrhea. You have diabetes and you have trouble keeping your blood sugar in your target range. Get help right away if: You have chest pain. You have shortness of breath. You have vomiting or diarrhea that lasts for more than 2 days. You faint. These symptoms may be an emergency. Get help right away. Call 911. Do not wait to see if the symptoms will go away. Do not drive yourself to the hospital. Summary Hypokalemia means that the amount of potassium in the blood is lower than normal. This condition is diagnosed with a blood test. Hypokalemia may be treated by taking potassium supplements, adjusting the medicines that you take, or eating more foods that are high in potassium. If your potassium level is very low, you may need to get potassium through an IV and be monitored in the hospital. This information is not intended to replace advice given to you by your health care provider. Make sure you discuss any questions you have with your health care provider. Document Revised: 04/23/2021 Document Reviewed: 04/23/2021 Elsevier Patient Education  [place].   Rehydration, Older Adult  Rehydration is the replacement of fluids, salts, and minerals in the body (electrolytes) that are lost during dehydration. Dehydration  is when there is not enough water or other fluids in the body. This happens when you lose more fluids than you  take in. People who are age 75 or older have a higher risk of dehydration than younger adults. This is because in older age, the body: Is less able to maintain the right amount of water. Does not respond to temperature changes as well. Does not get a sense of thirst as easily or quickly. Other causes include: Not drinking enough fluids. This can occur when you are ill, when you forget to drink, or when you are doing activities that require a lot of energy, especially in hot weather. Conditions that cause loss of water or other fluids. These include diarrhea, vomiting, sweating, or urinating a lot. Other illnesses, such as fever or infection. Certain medicines, such as those that remove excess fluid from the body (diuretics). Symptoms of mild or moderate dehydration may include thirst, dry lips and mouth, and dizziness. Symptoms of severe dehydration may include increased heart rate, confusion, fainting, and not urinating. In severe cases, you may need to get fluids through an IV at the hospital. For mild or moderate cases, you can usually rehydrate at home by drinking certain fluids as told by your health care provider. What are the risks? Rehydration is usually safe. Taking in too much fluid (overhydration) can be a problem but is rare. Overhydration can cause an imbalance of electrolytes in the body, kidney failure, fluid in the lungs, or a decrease in salt (sodium) levels in the body. Supplies needed: You will need an oral rehydration solution (ORS) if your health care provider tells you to use one. This is a drink to treat dehydration. It can be found in pharmacies and retail stores. How to rehydrate Fluids Follow instructions from your health care provider about what to drink. The kind of fluid and the amount you should drink depend on your condition. In general, you should choose drinks that you prefer. If told by your health care provider, drink an ORS. Make an ORS by following  instructions on the package. Start by drinking small amounts, about  cup (120 mL) every 5-10 minutes. Slowly increase how much you drink until you have taken in the amount recommended by your health care provider. Drink enough clear fluids to keep your urine pale yellow. If you were told to drink an ORS, finish it first, then start slowly drinking other clear fluids. Drink fluids such as: Water. This includes sparkling and flavored water. Drinking only water can lead to having too little sodium in your body (hyponatremia). Follow the advice of your health care provider. Water from ice chips you suck on. Fruit juice with water added to it(diluted). Sports drinks. Hot or cold herbal teas. Broth-based soups. Coffee. Milk or milk products. Food Follow instructions from your health care provider about what to eat while you rehydrate. Your health care provider may recommend that you slowly begin eating regular foods in small amounts. Eat foods that contain a healthy balance of electrolytes, such as bananas, oranges, potatoes, tomatoes, and spinach. Avoid foods that are greasy or contain a lot of sugar. In some cases, you may get nutrition through a feeding tube that is passed through your nose and into your stomach (nasogastric tube, or NG tube). This may be done if you have uncontrolled vomiting or diarrhea. Drinks to avoid  Certain drinks may make dehydration worse. While you rehydrate, avoid drinking alcohol. How to tell if you are recovering from dehydration  You may be getting better if: You are urinating more often than before you started rehydrating. Your urine is pale yellow. Your energy level improves. You vomit less often. You have diarrhea less often. Your appetite improves or returns to normal. You feel less dizzy or light-headed. Your skin tone and color start to look more normal. Follow these instructions at home: Take over-the-counter and prescription medicines only as told by  your health care provider. Do not take sodium tablets. Doing this can lead to having too much sodium in your body (hypernatremia). Contact a health care provider if: You continue to have symptoms of mild or moderate dehydration, such as: Thirst. Dry lips. Slightly dry mouth. Dizziness. Dark urine or less urine than usual. Muscle cramps. You continue to vomit or have diarrhea. Get help right away if: You have symptoms of dehydration that get worse. You have a fever. You have a severe headache. You have been vomiting and have problems, such as: Your vomiting gets worse. Your vomit includes blood or green matter (bile). You cannot eat or drink without vomiting. You have problems with urination or bowel movements, such as: Diarrhea that gets worse. Blood in your stool (feces). This may cause stool to look black and tarry. Not urinating, or urinating only a small amount of very dark urine, within 6-8 hours. You have trouble breathing. You have symptoms that get worse with treatment. These symptoms may be an emergency. Get help right away. Call 911. Do not wait to see if the symptoms will go away. Do not drive yourself to the hospital. This information is not intended to replace advice given to you by your health care provider. Make sure you discuss any questions you have with your health care provider. Document Revised: 12/23/2021 Document Reviewed: 12/21/2021 Elsevier Patient Education  [place].  Neutropenia Neutropenia is a condition that occurs when you have low levels of neutrophils. Neutrophils are a type of white blood cells. They are made in the spongy center of bones (bone marrow). They fight infections. Neutrophils are your body's main defense against infections. The fewer neutrophils you have and the longer your body remains without them, the greater your risk of getting a severe infection. What are the causes? This condition can occur if your body uses up or  destroys neutrophils faster than your bone marrow can make them. Neutropenia may be caused by: A bacterial or fungal infection. Allergic disorders. Reactions to some medicines. An autoimmune disease. An enlarged spleen. This condition can also occur if your bone marrow does not produce enough neutrophils. This problem may be caused by: Cancer. Cancer treatments, such as radiation or chemotherapy. Viral infections. Medicines, such as phenytoin. Vitamin B12 deficiency. Diseases of the bone marrow. Environmental toxins, such as insecticides. What are the signs or symptoms? This condition does not usually cause symptoms. If symptoms are present, they are usually caused by an underlying infection. Symptoms of an infection may include: Fever. Chills. Swollen glands. Mouth ulcers. Cough. Rash or skin infection. Skin may be red, swollen, or painful. Abdominal or rectal pain. Frequent urination or pain or burning with urination. Because neutropenia weakens the immune system, symptoms of infection may be reduced. It is important to be aware of any changes in your body and talk to your health care provider. How is this diagnosed? This condition is diagnosed based on your medical history and a physical exam. Tests will also be done, such as: A complete blood count (CBC). Bone marrow biopsy. This is collecting  a sample of bone marrow for testing. A chest X-ray. A urine culture. A blood culture. How is this treated? Treatment depends on the underlying cause and severity of your condition. Mild neutropenia may not require treatment. Treatment may include medicines, such as: Antibiotic medicine given through an IV. Antiviral medicines. Antifungal medicines. A medicine to increase production of neutrophils (colony-stimulating factor). You may get this medicine through an IV or by injection. Steroids given through an IV. If an underlying condition is causing neutropenia, you may need treatment  for that condition. If medicines or cancer treatments are causing neutropenia, your health care provider may have you stop the medicines or treatment. Follow these instructions at home: Medicines  Take over-the-counter and prescription medicines only as told by your health care provider. Get an annual flu shot. Ask your health care provider whether you or anyone you live with needs any other vaccines. Eating and drinking Do not share food utensils. Do not eat unpasteurized foods. Do not eat raw or undercooked meat, eggs, or seafood. Do not eat unwashed, raw fruits or vegetables. Lifestyle Avoid exposure to groups of people or children. Avoid being around people who are sick. Avoid being around live plants or fresh flowers. Avoid being around dirt or dust, such as in construction areas or gardens. Wear gloves if you are going to do yard work or gardening. Do not provide direct care for pets. Avoid animal droppings. Do not clean litter boxes and bird cages. Do not have sex unless your health care provider has approved. Hygiene  Bathe daily. Clean the area between the genitals and the anus (perineal area) after you urinate or have a bowel movement. If you are female, wipe from front to back. Get regular dental care and brush your teeth with a soft toothbrush before and after meals. Do not use a regular razor. Use an electric razor to remove hair. Wash your hands often with soap and water for at least 20 seconds. Make sure others who come in contact with you also wash their hands. If soap and water are not available, use hand sanitizer. General instructions Take steps to reduce your risk of injury or infection. Follow any precautions as told by your health care provider. Take actions to avoid cuts and burns. For example: Be cautious when you use knives. Always cut away from yourself. Keep knives in protective sheaths or guards when not in use. Use oven mitts when you cook with a hot stove,  oven, or grill. Stand a safe distance away from open fires. Do not use tampons, enemas, or rectal suppositories unless your health care provider has approved. Keep all follow-up visits. This is important. Contact a health care provider if: You have a cough. You have a sore throat. You develop sores in your mouth or anus. You have a warm, red, or tender area on your skin. You have red streaks on the skin. You develop a rash. You have swollen lymph nodes. You have frequent or painful urination. You have vaginal discharge or itching. Get help right away if: You have a fever. You have chills or shaking. You have nausea or vomiting. You have a lot of fatigue. You have shortness of breath. Summary Neutropenia is a condition that occurs when you have a lower-than-normal level of a type of white blood cell (neutrophils) in your body. This condition can occur if your body uses up or destroys neutrophils faster than your bone marrow can make them. Treatment depends on the underlying cause and  severity of your condition. Mild neutropenia may not require treatment. Follow any precautions as told by your health care provider to reduce your risk for injury or infection. This information is not intended to replace advice given to you by your health care provider. Make sure you discuss any questions you have with your health care provider. Document Revised: 02/04/2021 Document Reviewed: 02/04/2021 Elsevier Patient Education  [place].

## 2022-10-21 NOTE — Progress Notes (Signed)
Patient seen by Ned Card, NP today  Vitals are within treatment parameters.   Labs reviewed by Ned Card, NP and are NOT within treatment parameters, verbal ANC 0.4.  Per physician team, treatment to be held today.  Patient to receive IVF and IV K today.

## 2022-10-21 NOTE — Progress Notes (Signed)
[facility] OFFICE PROGRESS NOTE   Diagnosis: Colon cancer  INTERVAL HISTORY:   Christina [name] returns as scheduled.  She completed cycle 1 FOLFIRI/Avastin 10/06/2022.  She feels she tolerated well.  No nausea or vomiting.  No mouth sores.  No significant diarrhea.  She denies bleeding.  No abdominal pain.  No fever or chills.  Appetite varies.  She feels weak today.  Objective:  Vital signs in last 24 hours:  Blood pressure 103/79, pulse 84, temperature 98.1 F (36.7 C), temperature source Oral, resp. rate 18, height '5\' 6"'$  (1.676 m), weight 126 lb 3.2 oz (57.2 kg), SpO2 96 %.    HEENT: No thrush or ulcers. Resp: Lungs clear bilaterally. Cardio: Regular rate and rhythm. GI: Abdomen soft and nontender.  No hepatosplenomegaly.  No mass. Vascular: No leg edema.  Skin: Palms without erythema. Port-A-Cath without erythema.  Lab Results:  Lab Results  Component Value Date   WBC 1.9 (L) 10/21/2022   HGB 9.4 (L) 10/21/2022   HCT 31.0 (L) 10/21/2022   MCV 87.3 10/21/2022   PLT 253 10/21/2022   NEUTROABS 0.4 (LL) 10/21/2022    Imaging:  No results found.  Medications: I have reviewed the patient's current medications.  Assessment/Plan: Metastatic colon cancer CT abdomen/pelvis 06/22/2022, interval appendectomy, extensive interstitial and extraluminal gas surrounding the surgical staples at the resection margin, extensive cecal wall and terminal ileum thickening, pathologic adenopathy in the ileocolic lymph nodes, interval development of by lobar hypoenhancing hepatic masses Ultrasound-guided biopsy of left liver mass 06/28/2022-static adenocarcinoma, CDX2 and CK20 positive; microsatellite stable, tumor mutation burden 4, K-ras G13D Colonoscopy 07/19/2022-malignant tumor in the cecum-moderately differentiated adenocarcinoma with mucinous features and background of high-grade dysplasia, ileocecal valve polyp-negative for malignancy Cycle 1 FOLFOX 07/28/2022 Cycle 2  FOLFOX 08/11/2022, Udenyca Cycle 3 FOLFOX 08/25/2022, Udenyca Cycle 4 FOLFOX 09/09/2022, Udenyca CTs abdomen/pelvis 10/01/2022-heterogeneous nodules and masses throughout the liver are enlarged or new in the interval.  Index mass in the inferior right hepatic lobe measures 4.4 x 7.9 cm, previously 2.9 x 5.2 cm.  Primary cecal mass appears grossly similar; adjacent ileocolic mesenteric lymph nodes are minimally smaller. 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  Right breast cancer 08/28/2015-pT2pN0, stage IIa invasive ductal carcinoma, grade 1, HER2 negative, ER positive, PR positive-adjuvant radiation and anastrozole 12/29/2015-May 2022 4.  Left breast cancer 08/28/2015,pT1cpN0, stage Ia invasive ductal carcinoma, grade 1, HER2 negative, ER positive, PR positive-adjuvant radiation, anastrozole 12/29/2015-May 2022   5.  Hypertension   6.  Upper abdominal pain-likely secondary to metastatic disease involving the liver and the cecal tumor   7.  Port-A-Cath placement 07/23/2022    Disposition: Christina [name] appears stable.  She has completed 1 cycle of FOLFIRI/Avastin.  She had no significant acute toxicity.  She is neutropenic today.  We reviewed precautions.  She and her daughter understand to contact the office with fever, chills, other signs of infection.  She is currently on an antibiotic for a urinary tract infection which she will continue.  We will add Udenyca on day of pump discontinuation with cycle 2.  She has had Udenyca in the past and tolerated well.  She will return for lab, follow-up, FOLFIRI/Avastin in approximately 1 week.  She agrees with this plan.  Unfortunately she continues to  lose  weight.  She will begin nutritional supplements.  She will meet with the dietitian next week.  She will work on increasing calories.  She feels weak.  We will give her a liter of IV fluids and 20 mEq of IV potassium while in the office today.  I will see her in follow-up in 1 week.    Ned Card ANP/GNP-BC   10/21/2022  9:59 AM

## 2022-10-22 ENCOUNTER — Inpatient Hospital Stay: Payer: Medicare Other

## 2022-10-23 ENCOUNTER — Inpatient Hospital Stay: Payer: Medicare Other

## 2022-10-23 ENCOUNTER — Other Ambulatory Visit: Payer: Self-pay | Admitting: Oncology

## 2022-10-27 ENCOUNTER — Inpatient Hospital Stay: Payer: Medicare Other | Admitting: Nurse Practitioner

## 2022-10-27 ENCOUNTER — Inpatient Hospital Stay: Payer: Medicare Other

## 2022-10-27 ENCOUNTER — Inpatient Hospital Stay (HOSPITAL_BASED_OUTPATIENT_CLINIC_OR_DEPARTMENT_OTHER): Payer: Medicare Other | Admitting: Nurse Practitioner

## 2022-10-27 ENCOUNTER — Ambulatory Visit (INDEPENDENT_AMBULATORY_CARE_PROVIDER_SITE_OTHER): Payer: Medicare Other

## 2022-10-27 ENCOUNTER — Encounter: Payer: Self-pay | Admitting: Nurse Practitioner

## 2022-10-27 ENCOUNTER — Other Ambulatory Visit (HOSPITAL_BASED_OUTPATIENT_CLINIC_OR_DEPARTMENT_OTHER): Payer: Self-pay

## 2022-10-27 ENCOUNTER — Ambulatory Visit: Payer: Medicare Other

## 2022-10-27 ENCOUNTER — Encounter: Payer: Self-pay | Admitting: Oncology

## 2022-10-27 ENCOUNTER — Inpatient Hospital Stay: Payer: Medicare Other | Attending: Oncology | Admitting: Nutrition

## 2022-10-27 VITALS — BP 111/88 | HR 100 | Temp 98.1°F | Resp 18 | Ht 66.0 in | Wt 133.0 lb

## 2022-10-27 DIAGNOSIS — Z86718 Personal history of other venous thrombosis and embolism: Secondary | ICD-10-CM | POA: Insufficient documentation

## 2022-10-27 DIAGNOSIS — I82402 Acute embolism and thrombosis of unspecified deep veins of left lower extremity: Secondary | ICD-10-CM | POA: Diagnosis not present

## 2022-10-27 DIAGNOSIS — Z7901 Long term (current) use of anticoagulants: Secondary | ICD-10-CM | POA: Diagnosis not present

## 2022-10-27 DIAGNOSIS — C182 Malignant neoplasm of ascending colon: Secondary | ICD-10-CM

## 2022-10-27 DIAGNOSIS — K3580 Unspecified acute appendicitis: Secondary | ICD-10-CM | POA: Diagnosis not present

## 2022-10-27 DIAGNOSIS — C180 Malignant neoplasm of cecum: Secondary | ICD-10-CM | POA: Diagnosis present

## 2022-10-27 DIAGNOSIS — Z5112 Encounter for antineoplastic immunotherapy: Secondary | ICD-10-CM | POA: Diagnosis present

## 2022-10-27 DIAGNOSIS — M79662 Pain in left lower leg: Secondary | ICD-10-CM | POA: Diagnosis not present

## 2022-10-27 DIAGNOSIS — Z5111 Encounter for antineoplastic chemotherapy: Secondary | ICD-10-CM | POA: Insufficient documentation

## 2022-10-27 DIAGNOSIS — Z79631 Long term (current) use of antimetabolite agent: Secondary | ICD-10-CM | POA: Diagnosis not present

## 2022-10-27 DIAGNOSIS — Z79634 Long term (current) use of topoisomerase inhibitor: Secondary | ICD-10-CM | POA: Diagnosis not present

## 2022-10-27 DIAGNOSIS — C787 Secondary malignant neoplasm of liver and intrahepatic bile duct: Secondary | ICD-10-CM | POA: Insufficient documentation

## 2022-10-27 DIAGNOSIS — Z95828 Presence of other vascular implants and grafts: Secondary | ICD-10-CM

## 2022-10-27 DIAGNOSIS — I10 Essential (primary) hypertension: Secondary | ICD-10-CM | POA: Diagnosis not present

## 2022-10-27 DIAGNOSIS — Z79899 Other long term (current) drug therapy: Secondary | ICD-10-CM | POA: Diagnosis not present

## 2022-10-27 LAB — CMP (CANCER CENTER ONLY)
ALT: 9 U/L (ref 0–44)
AST: 24 U/L (ref 15–41)
Albumin: 3 g/dL — ABNORMAL LOW (ref 3.5–5.0)
Alkaline Phosphatase: 193 U/L — ABNORMAL HIGH (ref 38–126)
Anion gap: 9 (ref 5–15)
BUN: 7 mg/dL — ABNORMAL LOW (ref 8–23)
CO2: 23 mmol/L (ref 22–32)
Calcium: 8.7 mg/dL — ABNORMAL LOW (ref 8.9–10.3)
Chloride: 103 mmol/L (ref 98–111)
Creatinine: 0.57 mg/dL (ref 0.44–1.00)
GFR, Estimated: >60 mL/min (ref >60)
Glucose, Bld: 93 mg/dL (ref 70–99)
Potassium: 4 mmol/L (ref 3.5–5.1)
Sodium: 135 mmol/L (ref 135–145)
Total Bilirubin: 0.5 mg/dL (ref 0.3–1.2)
Total Protein: 6 g/dL — ABNORMAL LOW (ref 6.5–8.1)

## 2022-10-27 LAB — CBC WITH DIFFERENTIAL (CANCER CENTER ONLY)
Abs Immature Granulocytes: 0.1 10*3/uL — ABNORMAL HIGH (ref 0.00–0.07)
Basophils Absolute: 0.1 10*3/uL (ref 0.0–0.1)
Basophils Relative: 1 %
Eosinophils Absolute: 0.1 10*3/uL (ref 0.0–0.5)
Eosinophils Relative: 3 %
HCT: 30.5 % — ABNORMAL LOW (ref 36.0–46.0)
Hemoglobin: 9.6 g/dL — ABNORMAL LOW (ref 12.0–15.0)
Immature Granulocytes: 2 %
Lymphocytes Relative: 47 %
Lymphs Abs: 2 10*3/uL (ref 0.7–4.0)
MCH: 27 pg (ref 26.0–34.0)
MCHC: 31.5 g/dL (ref 30.0–36.0)
MCV: 85.9 fL (ref 80.0–100.0)
Monocytes Absolute: 0.9 10*3/uL (ref 0.1–1.0)
Monocytes Relative: 20 %
Neutro Abs: 1.2 10*3/uL — ABNORMAL LOW (ref 1.7–7.7)
Neutrophils Relative %: 27 %
Platelet Count: 297 10*3/uL (ref 150–400)
RBC: 3.55 MIL/uL — ABNORMAL LOW (ref 3.87–5.11)
RDW: 18.3 % — ABNORMAL HIGH (ref 11.5–15.5)
WBC Count: 4.3 10*3/uL (ref 4.0–10.5)
nRBC: 0 % (ref 0.0–0.2)

## 2022-10-27 MED ORDER — MEGESTROL ACETATE 400 MG/10ML PO SUSP
200.0000 mg | Freq: Two times a day (BID) | ORAL | 0 refills | Status: DC
  Filled 2022-10-27: qty 240, 24d supply, fill #0

## 2022-10-27 MED ORDER — HEPARIN SOD (PORK) LOCK FLUSH 100 UNIT/ML IV SOLN
500.0000 [IU] | Freq: Once | INTRAVENOUS | Status: AC
  Administered 2022-10-27: 500 [IU] via INTRAVENOUS

## 2022-10-27 MED ORDER — APIXABAN (ELIQUIS) VTE STARTER PACK (10MG AND 5MG)
ORAL_TABLET | ORAL | 0 refills | Status: DC
  Filled 2022-10-27: qty 74, 30d supply, fill #0

## 2022-10-27 MED ORDER — SODIUM CHLORIDE 0.9% FLUSH
10.0000 mL | INTRAVENOUS | Status: DC | PRN
  Administered 2022-10-27: 10 mL via INTRAVENOUS

## 2022-10-27 NOTE — Patient Instructions (Signed)

## 2022-10-27 NOTE — Progress Notes (Signed)
[place] OFFICE PROGRESS NOTE   Diagnosis: Colon cancer  INTERVAL HISTORY:   Christina [name] returns as scheduled.  She completed cycle 1 FOLFIRI/Avastin 10/06/2022.  Cycle 2 was held 10/21/2022 due to neutropenia.  She is feeling better.  Appetite has improved since beginning Megace.  No nausea or vomiting.  No diarrhea.  She has intermittent nasal congestion.  She reports recent left calf pain.  Objective:  Vital signs in last 24 hours:  Blood pressure 111/88, pulse 100, temperature 98.1 F (36.7 C), temperature source Oral, resp. rate 18, height '5\' 6"'$  (1.676 m), weight 133 lb (60.3 kg), SpO2 100 %.    HEENT: Mild white coating over tongue.  No buccal thrush.  No ulcers. Resp: Lungs clear bilaterally. Cardio: Regular rate and rhythm. GI: Abdomen soft and nontender.  No hepatomegaly. Vascular: Trace edema left lower leg. Skin: Palms without erythema. Port-A-Cath without erythema.   Lab Results:  Lab Results  Component Value Date   WBC 4.3 10/27/2022   HGB 9.6 (L) 10/27/2022   HCT 30.5 (L) 10/27/2022   MCV 85.9 10/27/2022   PLT 297 10/27/2022   NEUTROABS 1.2 (L) 10/27/2022    Imaging:  VAS [place] LOWER EXTREMITY VENOUS (DVT)  Result Date: 10/27/2022  Lower Venous DVT Study Patient Name:  Christina [name]  Date of Exam:   10/27/2022 Medical Rec #: [id number]      Accession #:    [id number] Date of Birth: 12-21-1945      Patient Gender: F Patient Age:   76 years Exam Location:  [facility] Procedure:      VAS [place] LOWER EXTREMITY VENOUS (DVT) Referring Phys: Ned Card --------------------------------------------------------------------------------  Indications: Left calf and knee pain x 1 week. Patient denies increased chest pains and SOB.  Comparison Study: None Performing Technologist: Alecia Mackin RVT, RDCS (AE), RDMS  Examination Guidelines: A complete evaluation includes B-mode imaging, spectral Doppler, color Doppler, and power Doppler as needed of all accessible  portions of each vessel. Bilateral testing is considered an integral part of a complete examination. Limited examinations for reoccurring indications may be performed as noted. The reflux portion of the exam is performed with the patient in reverse Trendelenburg.  +---------+---------------+---------+-----------+----------+--------------+ RIGHT    CompressibilityPhasicitySpontaneityPropertiesThrombus Aging +---------+---------------+---------+-----------+----------+--------------+ CFV      Full           Yes      Yes                                 +---------+---------------+---------+-----------+----------+--------------+ SFJ      Full           Yes      Yes                                 +---------+---------------+---------+-----------+----------+--------------+ FV Prox  Full           Yes      Yes                                 +---------+---------------+---------+-----------+----------+--------------+ FV Mid   Full           Yes      Yes                                 +---------+---------------+---------+-----------+----------+--------------+  FV DistalFull           Yes      Yes                                 +---------+---------------+---------+-----------+----------+--------------+ PFV      Full                                                        +---------+---------------+---------+-----------+----------+--------------+ POP      Full           Yes      Yes                                 +---------+---------------+---------+-----------+----------+--------------+ PTV      Full           Yes      Yes                                 +---------+---------------+---------+-----------+----------+--------------+ PERO     Full           Yes      Yes                                 +---------+---------------+---------+-----------+----------+--------------+ Gastroc  Full                                                         +---------+---------------+---------+-----------+----------+--------------+ GSV      Full           Yes      Yes                                 +---------+---------------+---------+-----------+----------+--------------+   +---------+---------------+---------+-----------+----------+--------------+ LEFT     CompressibilityPhasicitySpontaneityPropertiesThrombus Aging +---------+---------------+---------+-----------+----------+--------------+ CFV      Full           Yes      Yes                                 +---------+---------------+---------+-----------+----------+--------------+ SFJ      Full           Yes      Yes                                 +---------+---------------+---------+-----------+----------+--------------+ FV Prox  Full           Yes      Yes                                 +---------+---------------+---------+-----------+----------+--------------+ FV Mid   Full           Yes  Yes                                 +---------+---------------+---------+-----------+----------+--------------+ FV DistalPartial        No       Yes                                 +---------+---------------+---------+-----------+----------+--------------+ PFV      Full                                                        +---------+---------------+---------+-----------+----------+--------------+ POP      Partial        No       Yes                                 +---------+---------------+---------+-----------+----------+--------------+ PTV      Full           No       Yes                                 +---------+---------------+---------+-----------+----------+--------------+ PERO     None           No       No                                  +---------+---------------+---------+-----------+----------+--------------+ Gastroc  Full                                                         +---------+---------------+---------+-----------+----------+--------------+ GSV      Full           Yes      Yes                                 +---------+---------------+---------+-----------+----------+--------------+    Findings reported to Merceda Elks, RN and Ned Card, NP thru secure chat in EPIC at 3:10 pm ( Patient being seen at [facility] [place] today).  Summary: RIGHT: - No evidence of deep vein thrombosis in the lower extremity. No indirect evidence of obstruction proximal to the inguinal ligament. - No cystic structure found in the popliteal fossa.  LEFT: - Findings consistent with acute deep vein thrombosis involving the left femoral vein, left popliteal vein, and left peroneal veins. - No cystic structure found in the popliteal fossa.  *See table(s) above for measurements and observations.    Preliminary     Medications: I have reviewed the patient's current medications.  Assessment/Plan: Metastatic colon cancer CT abdomen/pelvis 06/22/2022, interval appendectomy, extensive interstitial and extraluminal gas surrounding the surgical staples at the resection margin, extensive cecal wall and terminal ileum thickening, pathologic adenopathy in the ileocolic lymph nodes, interval development of by lobar hypoenhancing hepatic masses Ultrasound-guided  biopsy of left liver mass 06/28/2022-static adenocarcinoma, CDX2 and CK20 positive; microsatellite stable, tumor mutation burden 4, K-ras G13D Colonoscopy 07/19/2022-malignant tumor in the cecum-moderately differentiated adenocarcinoma with mucinous features and background of high-grade dysplasia, ileocecal valve polyp-negative for malignancy Cycle 1 FOLFOX 07/28/2022 Cycle 2 FOLFOX 08/11/2022, Udenyca Cycle 3 FOLFOX 08/25/2022, Udenyca Cycle 4 FOLFOX 09/09/2022, Udenyca CTs abdomen/pelvis 10/01/2022-heterogeneous nodules and masses throughout the liver are enlarged or new in the interval.  Index mass in the inferior right hepatic lobe  measures 4.4 x 7.9 cm, previously 2.9 x 5.2 cm.  Primary cecal mass appears grossly similar; adjacent ileocolic mesenteric lymph nodes are minimally smaller. Cycle 1 FOLFIRI/Avastin 10/06/2022 Chemotherapy held 10/21/2022 due to neutropenia Cycle 2 FOLFIRI/Avastin 10/28/2022, Udenyca, irinotecan dose reduced, 5-FU bolus eliminated Low-grade appendiceal mucinous neoplasm 03/03/2022 Appendectomy with neoplastic changes in the epithelium at the appendiceal margin of resection, gangrenous appendicitis, no mucinous deposits on appendiceal serosa, no neoplastic infiltration of the submucosa and muscularis 3.  Right breast cancer 08/28/2015-pT2pN0, stage IIa invasive ductal carcinoma, grade 1, HER2 negative, ER positive, PR positive-adjuvant radiation and anastrozole 12/29/2015-May 2022 4.  Left breast cancer 08/28/2015,pT1cpN0, stage Ia invasive ductal carcinoma, grade 1, HER2 negative, ER positive, PR positive-adjuvant radiation, anastrozole 12/29/2015-May 2022   5.  Hypertension   6.  Upper abdominal pain-likely secondary to metastatic disease involving the liver and the cecal tumor   7.  Port-A-Cath placement 07/23/2022  8.  Acute DVT involving left femoral vein, left popliteal vein and left peroneal veins 10/27/2022, Eliquis initiated  Disposition: Ms. Niederhauser appears stable.  She has completed 1 cycle of FOLFIRI/Avastin.  Cycle 2 was held last week due to neutropenia.  The [place] is higher but she remains neutropenic.  Plan to proceed with treatment tomorrow as scheduled.  Irinotecan will be dose reduced, 5-FU bolus eliminated.  She will receive Udenyca on the day of pump discontinuation.  She has a new left lower extremity DVT.  We reviewed the diagnosis and rationale for anticoagulation.  She understands the bleeding risk with anticoagulation.  She agrees to proceed.  She will begin Eliquis starter pack today.  She will return for lab, follow-up, treatment in 2 weeks.  We are available to see her sooner if  needed.    Ned Card ANP/GNP-BC   10/27/2022  3:39 PM

## 2022-10-27 NOTE — Progress Notes (Signed)
Brief nutrition follow up with patient and daughter.   Weight increased to 133 pounds today from 126 pounds 3 oz on Feb 29. Patient reports Megace is helping and she feels hungry. She is eating 3 meals daily. She wants to know if she can have milkshakes and ice cream.  She has been eating a lot of potatoes and string beans recently. She does not like ONS.  Nutrition Diagnosis: Food and Nutrition Related Knowledge Deficit continues  Intervention: Educated to feel free to add milkshakes and ice cream as desired.  Reviewed dietary sources of potassium. Continue frequent small meals and snacks. Declines ONS.  Monitoring, Evaluation, Goals: Patient will tolerate increased calories and protein to minimize wt loss.  Next Visit: To be scheduled as needed.

## 2022-10-27 NOTE — Addendum Note (Signed)
Addended by: [name] B on: 10/27/2022 05:16 PM   Modules accepted: Orders

## 2022-10-27 NOTE — Progress Notes (Signed)
Patient seen by Ned Card NP today  Vitals are within treatment parameters.  Labs reviewed by Ned Card NP and are within treatment parameters.  Per physician team, patient is ready for treatment. Please note that modifications are being made to the treatment plan including Irinotecan will be dose reduced. 5-FU bolus is eliminated. Treatment will be on 10/28/2022.

## 2022-10-28 ENCOUNTER — Other Ambulatory Visit (HOSPITAL_BASED_OUTPATIENT_CLINIC_OR_DEPARTMENT_OTHER): Payer: Self-pay

## 2022-10-28 ENCOUNTER — Inpatient Hospital Stay: Payer: Medicare Other

## 2022-10-28 ENCOUNTER — Ambulatory Visit: Payer: Medicare Other

## 2022-10-28 VITALS — BP 152/72 | HR 67 | Temp 98.2°F | Resp 17

## 2022-10-28 DIAGNOSIS — C182 Malignant neoplasm of ascending colon: Secondary | ICD-10-CM

## 2022-10-28 DIAGNOSIS — I82402 Acute embolism and thrombosis of unspecified deep veins of left lower extremity: Secondary | ICD-10-CM

## 2022-10-28 DIAGNOSIS — Z5112 Encounter for antineoplastic immunotherapy: Secondary | ICD-10-CM | POA: Diagnosis not present

## 2022-10-28 LAB — TOTAL PROTEIN, URINE DIPSTICK: Protein, ur: NEGATIVE mg/dL

## 2022-10-28 MED ORDER — SODIUM CHLORIDE 0.9 % IV SOLN
5.0000 mg/kg | Freq: Once | INTRAVENOUS | Status: AC
  Administered 2022-10-28: 300 mg via INTRAVENOUS
  Filled 2022-10-28: qty 12

## 2022-10-28 MED ORDER — ATROPINE SULFATE 1 MG/ML IV SOLN
0.5000 mg | Freq: Once | INTRAVENOUS | Status: AC | PRN
  Administered 2022-10-28: 0.5 mg via INTRAVENOUS
  Filled 2022-10-28: qty 1

## 2022-10-28 MED ORDER — SODIUM CHLORIDE 0.9 % IV SOLN
150.0000 mg | Freq: Once | INTRAVENOUS | Status: AC
  Administered 2022-10-28: 150 mg via INTRAVENOUS
  Filled 2022-10-28: qty 5

## 2022-10-28 MED ORDER — SODIUM CHLORIDE 0.9 % IV SOLN
10.0000 mg | Freq: Once | INTRAVENOUS | Status: AC
  Administered 2022-10-28: 10 mg via INTRAVENOUS
  Filled 2022-10-28: qty 1

## 2022-10-28 MED ORDER — SODIUM CHLORIDE 0.9 % IV SOLN
400.0000 mg/m2 | Freq: Once | INTRAVENOUS | Status: AC
  Administered 2022-10-28: 652 mg via INTRAVENOUS
  Filled 2022-10-28: qty 32.6

## 2022-10-28 MED ORDER — SODIUM CHLORIDE 0.9 % IV SOLN
2400.0000 mg/m2 | INTRAVENOUS | Status: DC
  Administered 2022-10-28: 3900 mg via INTRAVENOUS
  Filled 2022-10-28: qty 78

## 2022-10-28 MED ORDER — SODIUM CHLORIDE 0.9 % IV SOLN
135.0000 mg/m2 | Freq: Once | INTRAVENOUS | Status: AC
  Administered 2022-10-28: 220 mg via INTRAVENOUS
  Filled 2022-10-28: qty 10

## 2022-10-28 MED ORDER — PROCHLORPERAZINE EDISYLATE 10 MG/2ML IJ SOLN
10.0000 mg | Freq: Once | INTRAMUSCULAR | Status: AC
  Administered 2022-10-28: 10 mg via INTRAVENOUS
  Filled 2022-10-28: qty 2

## 2022-10-28 MED ORDER — SODIUM CHLORIDE 0.9 % IV SOLN: Freq: Once | INTRAVENOUS | Status: AC

## 2022-10-28 NOTE — Patient Instructions (Addendum)
[place]   The chemotherapy medication bag should finish at 46 hours, 96 hours, or 7 days. For example, if your pump is scheduled for 46 hours and it was put on at 4:00 p.m., it should finish at 2:00 p.m. the day it is scheduled to come off regardless of your appointment time.     Estimated time to finish at 12:45 Saturday, October 30, 2022.   If the display on your pump reads "Low Volume" and it is beeping, take the batteries out of the pump and come to the cancer center for it to be taken off.   If the pump alarms go off prior to the pump reading "Low Volume" then call [phone] and someone can assist you.  If the plunger comes out and the chemotherapy medication is leaking out, please use your home chemo spill kit to clean up the spill. Do NOT use paper towels or other household products.  If you have problems or questions regarding your pump, please call either [phone] (24 hours a day) or the cancer center Monday-Friday 8:00 a.m.- 4:30 p.m. at the clinic number and we will assist you. If you are unable to get assistance, then go to the nearest Emergency Department and ask the staff to contact the IV team for assistance.  Discharge Instructions: Thank you for choosing [facility] to provide your oncology and hematology care.   If you have a lab appointment with the [facility], please go directly to the [facility] and check in at the registration area.   Wear comfortable clothing and clothing appropriate for easy access to any Portacath or PICC line.   We strive to give you quality time with your provider. You may need to reschedule your appointment if you arrive late (15 or more minutes).  Arriving late affects you and other patients whose appointments are after yours.  Also, if you miss three or more appointments without notifying the office, you may be dismissed from the clinic at the provider's discretion.      For  prescription refill requests, have your pharmacy contact our office and allow 72 hours for refills to be completed.    Today you received the following chemotherapy and/or immunotherapy agents Bevacizumab, Irinotecan, Leucovorin, Fluorouracil.      To help prevent nausea and vomiting after your treatment, we encourage you to take your nausea medication as directed.  BELOW ARE SYMPTOMS THAT SHOULD BE REPORTED IMMEDIATELY: *FEVER GREATER THAN 100.4 F (38 C) OR HIGHER *CHILLS OR SWEATING *NAUSEA AND VOMITING THAT IS NOT CONTROLLED WITH YOUR NAUSEA MEDICATION *UNUSUAL SHORTNESS OF BREATH *UNUSUAL BRUISING OR BLEEDING *URINARY PROBLEMS (pain or burning when urinating, or frequent urination) *BOWEL PROBLEMS (unusual diarrhea, constipation, pain near the anus) TENDERNESS IN MOUTH AND THROAT WITH OR WITHOUT PRESENCE OF ULCERS (sore throat, sores in mouth, or a toothache) UNUSUAL RASH, SWELLING OR PAIN  UNUSUAL VAGINAL DISCHARGE OR ITCHING   Items with * indicate a potential emergency and should be followed up as soon as possible or go to the Emergency Department if any problems should occur.  Please show the CHEMOTHERAPY ALERT CARD or IMMUNOTHERAPY ALERT CARD at check-in to the Emergency Department and triage nurse.  Should you have questions after your visit or need to cancel or reschedule your appointment, please contact [facility]  Dept: [phone]  and follow the prompts.  Office hours are 8:00 a.m. to 4:30 p.m. Monday - Friday. Please  note that voicemails left after 4:00 p.m. may not be returned until the following business day.  We are closed weekends and major holidays. You have access to a nurse at all times for urgent questions. Please call the main number to the clinic Dept: [phone] and follow the prompts.   For any non-urgent questions, you may also contact your provider using MyChart. We now offer e-Visits for anyone 32 and older to request  care online for non-urgent symptoms. For details visit [email or web address].   Also download the MyChart app! Go to the app store, search "MyChart", open the app, select [facility], and log in with your MyChart username and password.  Bevacizumab Injection What is this medication? BEVACIZUMAB (be va SIZ yoo mab) treats some types of cancer. It works by blocking a protein that causes cancer cells to grow and multiply. This helps to slow or stop the spread of cancer cells. It is a monoclonal antibody. This medicine may be used for other purposes; ask your health care provider or pharmacist if you have questions. COMMON BRAND NAME(S): Alymsys, Avastin, MVASI, Noah Charon What should I tell my care team before I take this medication? They need to know if you have any of these conditions: Blood clots Coughing up blood Having or recent surgery Heart failure High blood pressure History of a connection between 2 or more body parts that do not usually connect (fistula) History of a tear in your stomach or intestines Protein in your urine An unusual or allergic reaction to bevacizumab, other medications, foods, dyes, or preservatives Pregnant or trying to get pregnant Breast-feeding How should I use this medication? This medication is injected into a vein. It is given by your care team in a hospital or clinic setting. Talk to your care team the use of this medication in children. Special care may be needed. Overdosage: If you think you have taken too much of this medicine contact a poison control center or emergency room at once. NOTE: This medicine is only for you. Do not share this medicine with others. What if I miss a dose? Keep appointments for follow-up doses. It is important not to miss your dose. Call your care team if you are unable to keep an appointment. What may interact with this medication? Interactions are not expected. This list may not describe all possible interactions. Give  your health care provider a list of all the medicines, herbs, non-prescription drugs, or dietary supplements you use. Also tell them if you smoke, drink alcohol, or use illegal drugs. Some items may interact with your medicine. What should I watch for while using this medication? Your condition will be monitored carefully while you are receiving this medication. You may need blood work while taking this medication. This medication may make you feel generally unwell. This is not uncommon as chemotherapy can affect healthy cells as well as cancer cells. Report any side effects. Continue your course of treatment even though you feel ill unless your care team tells you to stop. This medication may increase your risk to bruise or bleed. Call your care team if you notice any unusual bleeding. Before having surgery, talk to your care team to make sure it is ok. This medication can increase the risk of poor healing of your surgical site or wound. You will need to stop this medication for 28 days before surgery. After surgery, wait at least 28 days before restarting this medication. Make sure the surgical site or wound is healed enough before restarting  this medication. Talk to your care team if questions. Talk to your care team if you may be pregnant. Serious birth defects can occur if you take this medication during pregnancy and for 6 months after the last dose. Contraception is recommended while taking this medication and for 6 months after the last dose. Your care team can help you find the option that works for you. Do not breastfeed while taking this medication and for 6 months after the last dose. This medication can cause infertility. Talk to your care team if you are concerned about your fertility. What side effects may I notice from receiving this medication? Side effects that you should report to your care team as soon as possible: Allergic reactions--skin rash, itching, hives, swelling of the face,  lips, tongue, or throat Bleeding--bloody or black, tar-like stools, vomiting blood or brown material that looks like coffee grounds, red or dark brown urine, small red or purple spots on skin, unusual bruising or bleeding Blood clot--pain, swelling, or warmth in the leg, shortness of breath, chest pain Heart attack--pain or tightness in the chest, shoulders, arms, or jaw, nausea, shortness of breath, cold or clammy skin, feeling faint or lightheaded Heart failure--shortness of breath, swelling of the ankles, feet, or hands, sudden weight gain, unusual weakness or fatigue Increase in blood pressure Infection--fever, chills, cough, sore throat, wounds that don't heal, pain or trouble when passing urine, general feeling of discomfort or being unwell Infusion reactions--chest pain, shortness of breath or trouble breathing, feeling faint or lightheaded Kidney injury--decrease in the amount of urine, swelling of the ankles, hands, or feet Stomach pain that is severe, does not go away, or gets worse Stroke--sudden numbness or weakness of the face, arm, or leg, trouble speaking, confusion, trouble walking, loss of balance or coordination, dizziness, severe headache, change in vision Sudden and severe headache, confusion, change in vision, seizures, which may be signs of posterior reversible encephalopathy syndrome (PRES) Side effects that usually do not require medical attention (report to your care team if they continue or are bothersome): Back pain Change in taste Diarrhea Dry skin Increased tears Nosebleed This list may not describe all possible side effects. Call your doctor for medical advice about side effects. You may report side effects to FDA at 1-800-FDA-1088. Where should I keep my medication? This medication is given in a hospital or clinic. It will not be stored at home. NOTE: This sheet is a summary. It may not cover all possible information. If you have questions about this medicine,  talk to your doctor, pharmacist, or health care provider.  2023 Elsevier/Gold Standard (2021-12-11 00:00:00)  Irinotecan Injection What is this medication? IRINOTECAN (ir in oh TEE kan) treats some types of cancer. It works by slowing down the growth of cancer cells. This medicine may be used for other purposes; ask your health care provider or pharmacist if you have questions. COMMON BRAND NAME(S): Camptosar What should I tell my care team before I take this medication? They need to know if you have any of these conditions: Dehydration Diarrhea Infection, especially a viral infection, such as chickenpox, cold sores, herpes Liver disease Low blood cell levels (white cells, red cells, and platelets) Low levels of electrolytes, such as calcium, magnesium, or potassium in your blood Recent or ongoing radiation An unusual or allergic reaction to irinotecan, other medications, foods, dyes, or preservatives If you or your partner are pregnant or trying to get pregnant Breast-feeding How should I use this medication? This medication is injected  into a vein. It is given by your care team in a hospital or clinic setting. Talk to your care team about the use of this medication in children. Special care may be needed. Overdosage: If you think you have taken too much of this medicine contact a poison control center or emergency room at once. NOTE: This medicine is only for you. Do not share this medicine with others. What if I miss a dose? Keep appointments for follow-up doses. It is important not to miss your dose. Call your care team if you are unable to keep an appointment. What may interact with this medication? Do not take this medication with any of the following: Cobicistat Itraconazole This medication may also interact with the following: Certain antibiotics, such as clarithromycin, rifampin, rifabutin Certain antivirals for HIV or AIDS Certain medications for fungal infections, such  as ketoconazole, posaconazole, voriconazole Certain medications for seizures, such as carbamazepine, phenobarbital, phenytoin Gemfibrozil Nefazodone St. John's wort This list may not describe all possible interactions. Give your health care provider a list of all the medicines, herbs, non-prescription drugs, or dietary supplements you use. Also tell them if you smoke, drink alcohol, or use illegal drugs. Some items may interact with your medicine. What should I watch for while using this medication? Your condition will be monitored carefully while you are receiving this medication. You may need blood work while taking this medication. This medication may make you feel generally unwell. This is not uncommon as chemotherapy can affect healthy cells as well as cancer cells. Report any side effects. Continue your course of treatment even though you feel ill unless your care team tells you to stop. This medication can cause serious side effects. To reduce the risk, your care team may give you other medications to take before receiving this one. Be sure to follow the directions from your care team. This medication may affect your coordination, reaction time, or judgement. Do not drive or operate machinery until you know how this medication affects you. Sit up or stand slowly to reduce the risk of dizzy or fainting spells. Drinking alcohol with this medication can increase the risk of these side effects. This medication may increase your risk of getting an infection. Call your care team for advice if you get a fever, chills, sore throat, or other symptoms of a cold or flu. Do not treat yourself. Try to avoid being around people who are sick. Avoid taking medications that contain aspirin, acetaminophen, ibuprofen, naproxen, or ketoprofen unless instructed by your care team. These medications may hide a fever. This medication may increase your risk to bruise or bleed. Call your care team if you notice any  unusual bleeding. Be careful brushing or flossing your teeth or using a toothpick because you may get an infection or bleed more easily. If you have any dental work done, tell your dentist you are receiving this medication. Talk to your care team if you or your partner are pregnant or think either of you might be pregnant. This medication can cause serious birth defects if taken during pregnancy and for 6 months after the last dose. You will need a negative pregnancy test before starting this medication. Contraception is recommended while taking this medication and for 6 months after the last dose. Your care team can help you find the option that works for you. Do not father a child while taking this medication and for 3 months after the last dose. Use a condom for contraception during this time  period. Do not breastfeed while taking this medication and for 7 days after the last dose. This medication may cause infertility. Talk to your care team if you are concerned about your fertility. What side effects may I notice from receiving this medication? Side effects that you should report to your care team as soon as possible: Allergic reactions--skin rash, itching, hives, swelling of the face, lips, tongue, or throat Dry cough, shortness of breath or trouble breathing Increased saliva or tears, increased sweating, stomach cramping, diarrhea, small pupils, unusual weakness or fatigue, slow heartbeat Infection--fever, chills, cough, sore throat, wounds that don't heal, pain or trouble when passing urine, general feeling of discomfort or being unwell Kidney injury--decrease in the amount of urine, swelling of the ankles, hands, or feet Low red blood cell level--unusual weakness or fatigue, dizziness, headache, trouble breathing Severe or prolonged diarrhea Unusual bruising or bleeding Side effects that usually do not require medical attention (report to your care team if they continue or are  bothersome): Constipation Diarrhea Hair loss Loss of appetite Nausea Stomach pain This list may not describe all possible side effects. Call your doctor for medical advice about side effects. You may report side effects to FDA at 1-800-FDA-1088. Where should I keep my medication? This medication is given in a hospital or clinic. It will not be stored at home. NOTE: This sheet is a summary. It may not cover all possible information. If you have questions about this medicine, talk to your doctor, pharmacist, or health care provider.  2023 Elsevier/Gold Standard (2021-12-17 00:00:00)  Leucovorin Injection What is this medication? LEUCOVORIN (loo koe VOR in) prevents side effects from certain medications, such as methotrexate. It works by increasing folate levels. This helps protect healthy cells in your body. It may also be used to treat anemia caused by low levels of folate. It can also be used with fluorouracil, a type of chemotherapy, to treat colorectal cancer. It works by increasing the effects of fluorouracil in the body. This medicine may be used for other purposes; ask your health care provider or pharmacist if you have questions. What should I tell my care team before I take this medication? They need to know if you have any of these conditions: Anemia from low levels of vitamin B12 in the blood An unusual or allergic reaction to leucovorin, folic acid, other medications, foods, dyes, or preservatives Pregnant or trying to get pregnant Breastfeeding How should I use this medication? This medication is injected into a vein or a muscle. It is given by your care team in a hospital or clinic setting. Talk to your care team about the use of this medication in children. Special care may be needed. Overdosage: If you think you have taken too much of this medicine contact a poison control center or emergency room at once. NOTE: This medicine is only for you. Do not share this medicine with  others. What if I miss a dose? Keep appointments for follow-up doses. It is important not to miss your dose. Call your care team if you are unable to keep an appointment. What may interact with this medication? Capecitabine Fluorouracil Phenobarbital Phenytoin Primidone Trimethoprim;sulfamethoxazole This list may not describe all possible interactions. Give your health care provider a list of all the medicines, herbs, non-prescription drugs, or dietary supplements you use. Also tell them if you smoke, drink alcohol, or use illegal drugs. Some items may interact with your medicine. What should I watch for while using this medication? Your condition will  be monitored carefully while you are receiving this medication. This medication may increase the side effects of 5-fluorouracil. Tell your care team if you have diarrhea or mouth sores that do not get better or that get worse. What side effects may I notice from receiving this medication? Side effects that you should report to your care team as soon as possible: Allergic reactions--skin rash, itching, hives, swelling of the face, lips, tongue, or throat This list may not describe all possible side effects. Call your doctor for medical advice about side effects. You may report side effects to FDA at 1-800-FDA-1088. Where should I keep my medication? This medication is given in a hospital or clinic. It will not be stored at home. NOTE: This sheet is a summary. It may not cover all possible information. If you have questions about this medicine, talk to your doctor, pharmacist, or health care provider.  2023 Elsevier/Gold Standard (2021-12-18 00:00:00)  Fluorouracil Injection What is this medication? FLUOROURACIL (flure oh YOOR a sil) treats some types of cancer. It works by slowing down the growth of cancer cells. This medicine may be used for other purposes; ask your health care provider or pharmacist if you have questions. COMMON BRAND  NAME(S): Adrucil What should I tell my care team before I take this medication? They need to know if you have any of these conditions: Blood disorders Dihydropyrimidine dehydrogenase (DPD) deficiency Infection, such as chickenpox, cold sores, herpes Kidney disease Liver disease Poor nutrition Recent or ongoing radiation therapy An unusual or allergic reaction to fluorouracil, other medications, foods, dyes, or preservatives If you or your partner are pregnant or trying to get pregnant Breast-feeding How should I use this medication? This medication is injected into a vein. It is administered by your care team in a hospital or clinic setting. Talk to your care team about the use of this medication in children. Special care may be needed. Overdosage: If you think you have taken too much of this medicine contact a poison control center or emergency room at once. NOTE: This medicine is only for you. Do not share this medicine with others. What if I miss a dose? Keep appointments for follow-up doses. It is important not to miss your dose. Call your care team if you are unable to keep an appointment. What may interact with this medication? Do not take this medication with any of the following: Live virus vaccines This medication may also interact with the following: Medications that treat or prevent blood clots, such as warfarin, enoxaparin, dalteparin This list may not describe all possible interactions. Give your health care provider a list of all the medicines, herbs, non-prescription drugs, or dietary supplements you use. Also tell them if you smoke, drink alcohol, or use illegal drugs. Some items may interact with your medicine. What should I watch for while using this medication? Your condition will be monitored carefully while you are receiving this medication. This medication may make you feel generally unwell. This is not uncommon as chemotherapy can affect healthy cells as well as  cancer cells. Report any side effects. Continue your course of treatment even though you feel ill unless your care team tells you to stop. In some cases, you may be given additional medications to help with side effects. Follow all directions for their use. This medication may increase your risk of getting an infection. Call your care team for advice if you get a fever, chills, sore throat, or other symptoms of a cold or flu. Do  not treat yourself. Try to avoid being around people who are sick. This medication may increase your risk to bruise or bleed. Call your care team if you notice any unusual bleeding. Be careful brushing or flossing your teeth or using a toothpick because you may get an infection or bleed more easily. If you have any dental work done, tell your dentist you are receiving this medication. Avoid taking medications that contain aspirin, acetaminophen, ibuprofen, naproxen, or ketoprofen unless instructed by your care team. These medications may hide a fever. Do not treat diarrhea with over the counter products. Contact your care team if you have diarrhea that lasts more than 2 days or if it is severe and watery. This medication can make you more sensitive to the sun. Keep out of the sun. If you cannot avoid being in the sun, wear protective clothing and sunscreen. Do not use sun lamps, tanning beds, or tanning booths. Talk to your care team if you or your partner wish to become pregnant or think you might be pregnant. This medication can cause serious birth defects if taken during pregnancy and for 3 months after the last dose. A reliable form of contraception is recommended while taking this medication and for 3 months after the last dose. Talk to your care team about effective forms of contraception. Do not father a child while taking this medication and for 3 months after the last dose. Use a condom while having sex during this time period. Do not breastfeed while taking this  medication. This medication may cause infertility. Talk to your care team if you are concerned about your fertility. What side effects may I notice from receiving this medication? Side effects that you should report to your care team as soon as possible: Allergic reactions--skin rash, itching, hives, swelling of the face, lips, tongue, or throat Heart attack--pain or tightness in the chest, shoulders, arms, or jaw, nausea, shortness of breath, cold or clammy skin, feeling faint or lightheaded Heart failure--shortness of breath, swelling of the ankles, feet, or hands, sudden weight gain, unusual weakness or fatigue Heart rhythm changes--fast or irregular heartbeat, dizziness, feeling faint or lightheaded, chest pain, trouble breathing High ammonia level--unusual weakness or fatigue, confusion, loss of appetite, nausea, vomiting, seizures Infection--fever, chills, cough, sore throat, wounds that don't heal, pain or trouble when passing urine, general feeling of discomfort or being unwell Low red blood cell level--unusual weakness or fatigue, dizziness, headache, trouble breathing Pain, tingling, or numbness in the hands or feet, muscle weakness, change in vision, confusion or trouble speaking, loss of balance or coordination, trouble walking, seizures Redness, swelling, and blistering of the skin over hands and feet Severe or prolonged diarrhea Unusual bruising or bleeding Side effects that usually do not require medical attention (report to your care team if they continue or are bothersome): Dry skin Headache Increased tears Nausea Pain, redness, or swelling with sores inside the mouth or throat Sensitivity to light Vomiting This list may not describe all possible side effects. Call your doctor for medical advice about side effects. You may report side effects to FDA at 1-800-FDA-1088. Where should I keep my medication? This medication is given in a hospital or clinic. It will not be stored  at home. NOTE: This sheet is a summary. It may not cover all possible information. If you have questions about this medicine, talk to your doctor, pharmacist, or health care provider.  2023 Elsevier/Gold Standard (2021-12-08 00:00:00)

## 2022-10-28 NOTE — Progress Notes (Signed)
Per Dr. Benay Spice, [facility] to remove pump early at 12:45pm on 10/30/22. Ok to waste remainder of fluorouracil, no bolus.

## 2022-10-30 ENCOUNTER — Inpatient Hospital Stay: Payer: Medicare Other

## 2022-10-30 VITALS — BP 132/89 | HR 83 | Temp 98.9°F | Resp 17

## 2022-10-30 DIAGNOSIS — Z5112 Encounter for antineoplastic immunotherapy: Secondary | ICD-10-CM | POA: Diagnosis not present

## 2022-10-30 MED ORDER — SODIUM CHLORIDE 0.9% FLUSH
10.0000 mL | INTRAVENOUS | Status: DC | PRN
  Administered 2022-10-30: 10 mL

## 2022-10-30 MED ORDER — PEGFILGRASTIM-CBQV 6 MG/0.6ML ~~LOC~~ SOSY
6.0000 mg | PREFILLED_SYRINGE | Freq: Once | SUBCUTANEOUS | Status: AC
  Administered 2022-10-30: 6 mg via SUBCUTANEOUS

## 2022-10-30 MED ORDER — HEPARIN SOD (PORK) LOCK FLUSH 100 UNIT/ML IV SOLN
500.0000 [IU] | Freq: Once | INTRAVENOUS | Status: AC | PRN
  Administered 2022-10-30: 500 [IU]

## 2022-11-04 ENCOUNTER — Inpatient Hospital Stay: Payer: Medicare Other | Admitting: Nurse Practitioner

## 2022-11-04 ENCOUNTER — Inpatient Hospital Stay: Payer: Medicare Other

## 2022-11-05 ENCOUNTER — Telehealth: Payer: Self-pay

## 2022-11-05 NOTE — Telephone Encounter (Signed)
Patient's daughter called after hour to report her mother is having pain in the right leg. I called and spoke with Miss [name] and she states her mother is feeling better she take tylenol. Denies and pain. I made Lattie Haw aware.

## 2022-11-07 ENCOUNTER — Other Ambulatory Visit: Payer: Self-pay | Admitting: Oncology

## 2022-11-07 DIAGNOSIS — C182 Malignant neoplasm of ascending colon: Secondary | ICD-10-CM

## 2022-11-10 ENCOUNTER — Encounter: Payer: Self-pay | Admitting: *Deleted

## 2022-11-10 ENCOUNTER — Inpatient Hospital Stay: Payer: Medicare Other

## 2022-11-10 ENCOUNTER — Other Ambulatory Visit (HOSPITAL_COMMUNITY): Payer: Self-pay

## 2022-11-10 ENCOUNTER — Inpatient Hospital Stay (HOSPITAL_BASED_OUTPATIENT_CLINIC_OR_DEPARTMENT_OTHER): Payer: Medicare Other | Admitting: Oncology

## 2022-11-10 ENCOUNTER — Telehealth: Payer: Self-pay | Admitting: *Deleted

## 2022-11-10 ENCOUNTER — Ambulatory Visit: Payer: Medicare Other | Admitting: Nutrition

## 2022-11-10 VITALS — BP 111/88 | HR 112 | Temp 98.1°F | Resp 18 | Ht 66.0 in | Wt 121.0 lb

## 2022-11-10 VITALS — BP 127/71 | HR 69

## 2022-11-10 DIAGNOSIS — Z5112 Encounter for antineoplastic immunotherapy: Secondary | ICD-10-CM | POA: Diagnosis not present

## 2022-11-10 DIAGNOSIS — C182 Malignant neoplasm of ascending colon: Secondary | ICD-10-CM

## 2022-11-10 DIAGNOSIS — I82402 Acute embolism and thrombosis of unspecified deep veins of left lower extremity: Secondary | ICD-10-CM

## 2022-11-10 LAB — TOTAL PROTEIN, URINE DIPSTICK: Protein, ur: 100 mg/dL — AB

## 2022-11-10 LAB — CBC WITH DIFFERENTIAL (CANCER CENTER ONLY)
Abs Immature Granulocytes: 0.34 10*3/uL — ABNORMAL HIGH (ref 0.00–0.07)
Basophils Absolute: 0.1 10*3/uL (ref 0.0–0.1)
Basophils Relative: 1 %
Eosinophils Absolute: 0.1 10*3/uL (ref 0.0–0.5)
Eosinophils Relative: 1 %
HCT: 30.6 % — ABNORMAL LOW (ref 36.0–46.0)
Hemoglobin: 9.5 g/dL — ABNORMAL LOW (ref 12.0–15.0)
Immature Granulocytes: 3 %
Lymphocytes Relative: 23 %
Lymphs Abs: 2.8 10*3/uL (ref 0.7–4.0)
MCH: 26.5 pg (ref 26.0–34.0)
MCHC: 31 g/dL (ref 30.0–36.0)
MCV: 85.5 fL (ref 80.0–100.0)
Monocytes Absolute: 0.8 10*3/uL (ref 0.1–1.0)
Monocytes Relative: 7 %
Neutro Abs: 8.3 10*3/uL — ABNORMAL HIGH (ref 1.7–7.7)
Neutrophils Relative %: 65 %
Platelet Count: 405 10*3/uL — ABNORMAL HIGH (ref 150–400)
RBC: 3.58 MIL/uL — ABNORMAL LOW (ref 3.87–5.11)
RDW: 17.4 % — ABNORMAL HIGH (ref 11.5–15.5)
WBC Count: 12.4 10*3/uL — ABNORMAL HIGH (ref 4.0–10.5)
nRBC: 0.2 % (ref 0.0–0.2)

## 2022-11-10 LAB — CMP (CANCER CENTER ONLY)
ALT: 6 U/L (ref 0–44)
AST: 15 U/L (ref 15–41)
Albumin: 3.3 g/dL — ABNORMAL LOW (ref 3.5–5.0)
Alkaline Phosphatase: 188 U/L — ABNORMAL HIGH (ref 38–126)
Anion gap: 12 (ref 5–15)
BUN: 9 mg/dL (ref 8–23)
CO2: 20 mmol/L — ABNORMAL LOW (ref 22–32)
Calcium: 9.4 mg/dL (ref 8.9–10.3)
Chloride: 106 mmol/L (ref 98–111)
Creatinine: 0.76 mg/dL (ref 0.44–1.00)
GFR, Estimated: >60 mL/min (ref >60)
Glucose, Bld: 98 mg/dL (ref 70–99)
Potassium: 3.9 mmol/L (ref 3.5–5.1)
Sodium: 138 mmol/L (ref 135–145)
Total Bilirubin: 0.6 mg/dL (ref 0.3–1.2)
Total Protein: 6.9 g/dL (ref 6.5–8.1)

## 2022-11-10 LAB — CEA (ACCESS): CEA (CHCC): 3471.97 ng/mL — ABNORMAL HIGH (ref 0.00–5.00)

## 2022-11-10 MED ORDER — SODIUM CHLORIDE 0.9 % IV SOLN
400.0000 mg/m2 | Freq: Once | INTRAVENOUS | Status: AC
  Administered 2022-11-10: 640 mg via INTRAVENOUS
  Filled 2022-11-10: qty 32

## 2022-11-10 MED ORDER — ACETAMINOPHEN 325 MG PO TABS
650.0000 mg | ORAL_TABLET | Freq: Once | ORAL | Status: AC
  Administered 2022-11-10: 650 mg via ORAL
  Filled 2022-11-10: qty 2

## 2022-11-10 MED ORDER — PROCHLORPERAZINE EDISYLATE 10 MG/2ML IJ SOLN
10.0000 mg | Freq: Once | INTRAMUSCULAR | Status: AC
  Administered 2022-11-10: 10 mg via INTRAVENOUS
  Filled 2022-11-10: qty 2

## 2022-11-10 MED ORDER — SODIUM CHLORIDE 0.9% FLUSH
10.0000 mL | INTRAVENOUS | Status: DC | PRN
  Administered 2022-11-10: 10 mL

## 2022-11-10 MED ORDER — SODIUM CHLORIDE 0.9 % IV SOLN
135.0000 mg/m2 | Freq: Once | INTRAVENOUS | Status: AC
  Administered 2022-11-10: 200 mg via INTRAVENOUS
  Filled 2022-11-10: qty 10

## 2022-11-10 MED ORDER — SODIUM CHLORIDE 0.9 % IV SOLN: INTRAVENOUS | Status: AC

## 2022-11-10 MED ORDER — SODIUM CHLORIDE 0.9 % IV SOLN
10.0000 mg | Freq: Once | INTRAVENOUS | Status: AC
  Administered 2022-11-10: 10 mg via INTRAVENOUS
  Filled 2022-11-10: qty 10

## 2022-11-10 MED ORDER — PREDNISONE 10 MG PO TABS
10.0000 mg | ORAL_TABLET | Freq: Every day | ORAL | 3 refills | Status: DC
  Filled 2022-11-10: qty 30, 30d supply, fill #0

## 2022-11-10 MED ORDER — SODIUM CHLORIDE 0.9 % IV SOLN: Freq: Once | INTRAVENOUS | Status: AC

## 2022-11-10 MED ORDER — SODIUM CHLORIDE 0.9 % IV SOLN
150.0000 mg | Freq: Once | INTRAVENOUS | Status: AC
  Administered 2022-11-10: 150 mg via INTRAVENOUS
  Filled 2022-11-10: qty 150

## 2022-11-10 MED ORDER — SODIUM CHLORIDE 0.9 % IV SOLN
5.0000 mg/kg | Freq: Once | INTRAVENOUS | Status: AC
  Administered 2022-11-10: 300 mg via INTRAVENOUS
  Filled 2022-11-10: qty 12

## 2022-11-10 MED ORDER — SODIUM CHLORIDE 0.9 % IV SOLN
2400.0000 mg/m2 | INTRAVENOUS | Status: DC
  Administered 2022-11-10: 3500 mg via INTRAVENOUS
  Filled 2022-11-10: qty 70

## 2022-11-10 MED ORDER — ATROPINE SULFATE 1 MG/ML IV SOLN
0.5000 mg | Freq: Once | INTRAVENOUS | Status: AC | PRN
  Administered 2022-11-10: 0.5 mg via INTRAVENOUS
  Filled 2022-11-10: qty 1

## 2022-11-10 NOTE — Progress Notes (Signed)
[facility] OFFICE PROGRESS NOTE   Diagnosis: Colon cancer  INTERVAL HISTORY:   Christina [name] completed another cycle of FOLFIRI/bevacizumab on 10/28/2022.  No nausea/vomiting or mouth sores.  No significant diarrhea.  She has occasional bleeding with a bowel movement.  No other bleeding.  She stays in her room most of the time.  Her appetite is poor.  She is here today with her daughter.  She is able to ambulate with assistance. She complains of pain in the left lower leg. Objective:  Vital signs in last 24 hours:  Blood pressure 111/88, pulse (!) 112, temperature 98.1 F (36.7 C), temperature source Oral, resp. rate 18, height 5\' 6"  (1.676 m), weight 121 lb (54.9 kg), SpO2 100 %.    HEENT: No thrush or ulcers Resp: Clear bilaterally Cardio: Regular rate and rhythm GI: No hepatomegaly, nontender Vascular: Trace edema throughout the left lower leg Neuro: Alert and oriented, she ambulated to the examination table  Portacath/PICC-without erythema  Lab Results:  Lab Results  Component Value Date   WBC 12.4 (H) 11/10/2022   HGB 9.5 (L) 11/10/2022   HCT 30.6 (L) 11/10/2022   MCV 85.5 11/10/2022   PLT 405 (H) 11/10/2022   NEUTROABS 8.3 (H) 11/10/2022    CMP  Lab Results  Component Value Date   NA 138 11/10/2022   K 3.9 11/10/2022   CL 106 11/10/2022   CO2 20 (L) 11/10/2022   GLUCOSE 98 11/10/2022   BUN 9 11/10/2022   CREATININE 0.76 11/10/2022   CALCIUM 9.4 11/10/2022   PROT 6.9 11/10/2022   ALBUMIN 3.3 (L) 11/10/2022   AST 15 11/10/2022   ALT 6 11/10/2022   ALKPHOS 188 (H) 11/10/2022   BILITOT 0.6 11/10/2022   GFRNONAA >60 11/10/2022   GFRAA >60 10/17/2019    Lab Results  Component Value Date   CEA 3,471.97 (H) 11/10/2022    Lab Results  Component Value Date   INR 1.1 06/28/2022   LABPROT 14.3 06/28/2022    Imaging:  No results found.  Medications: I have reviewed the patient's current medications.   Assessment/Plan: Metastatic  colon cancer CT abdomen/pelvis 06/22/2022, interval appendectomy, extensive interstitial and extraluminal gas surrounding the surgical staples at the resection margin, extensive cecal wall and terminal ileum thickening, pathologic adenopathy in the ileocolic lymph nodes, interval development of by lobar hypoenhancing hepatic masses Ultrasound-guided biopsy of left liver mass 06/28/2022-static adenocarcinoma, CDX2 and CK20 positive; microsatellite stable, tumor mutation burden 4, K-ras G13D Colonoscopy 07/19/2022-malignant tumor in the cecum-moderately differentiated adenocarcinoma with mucinous features and background of high-grade dysplasia, ileocecal valve polyp-negative for malignancy Cycle 1 FOLFOX 07/28/2022 Cycle 2 FOLFOX 08/11/2022, Udenyca Cycle 3 FOLFOX 08/25/2022, Udenyca Cycle 4 FOLFOX 09/09/2022, Udenyca CTs abdomen/pelvis 10/01/2022-heterogeneous nodules and masses throughout the liver are enlarged or new in the interval.  Index mass in the inferior right hepatic lobe measures 4.4 x 7.9 cm, previously 2.9 x 5.2 cm.  Primary cecal mass appears grossly similar; adjacent ileocolic mesenteric lymph nodes are minimally smaller. 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  Right breast cancer 08/28/2015-pT2pN0, stage IIa invasive ductal carcinoma, grade 1, HER2 negative, ER positive, PR positive-adjuvant radiation and anastrozole 12/29/2015-May 2022  4.  Left breast cancer 08/28/2015,pT1cpN0, stage Ia invasive ductal carcinoma, grade 1, HER2 negative, ER positive, PR  positive-adjuvant radiation, anastrozole 12/29/2015-May 2022   5.  Hypertension   6.  Upper abdominal pain-likely secondary to metastatic disease involving the liver and the cecal tumor   7.  Port-A-Cath placement 07/23/2022  8.  Acute DVT involving left femoral vein, left popliteal vein and left peroneal veins 10/27/2022, Eliquis initiated    Christina [name] has metastatic colon cancer.  She has completed 2 cycles of FOLFIRI/Avastin.  Her performance status remains borderline.  I explained the goals of chemotherapy are to palliate symptoms and improve survival.  No therapy will be curative.  We will need to consider discontinuing chemotherapy and referring her for hospice care if her performance status does not improve following a few more cycles of chemotherapy.  She will complete cycle 3 FOLFIRI/Avastin today.  She will return for an office visit and chemotherapy in 2 weeks.  We will check the CEA when she returns in 2 weeks. The leg pain is likely related to the recently diagnosed DVT.  She will use Tylenol as needed for pain.  I encouraged her to increase her calorie and liquid intake.  We will arrange for follow-up with the cancer center nutritionist.  I does adjust to the chemotherapy for weight loss.  [name], MD  11/10/2022  1:16 PM

## 2022-11-10 NOTE — Progress Notes (Signed)
Nutrition follow up completed with patient and daughter for metastatic colon cancer. She has completed 2 cycles of FOLFIRI/Avastin.  She will complete cycle 3 FOLFIRI/Avastin today.   Weight decreased and documented as 121 pounds today.  10-27-22  133 pounds  10-21-22 126 pounds 3 oz  10-06-22 135 pounds 09-08-22 144 pounds 11.2 oz 08-10-22 151 pounds   Labs noted  Patient denies nausea, vomiting and diarrhea. She does not have mouth sores. Per daughter, patient is taking megace as prescribed. She does not drink oral nutrition supplements. Patient has been refusing foods that she has been told to avoid in the past. (Declined pork because she was told years ago not to eat it because of high blood pressure) Daughter states patient will refuse to eat. Daughter gave her 2 crackers for breakfast because she wouldn't eat anything else.  Pt has now been refusing potatoes, which she loves, because she thought they gave her loose stools one time. Patient verbalizes she wants hot dogs, steak, fried fish, etc. She ate 1/2 hot dog with chili and cole slaw and 1/2 serving of fries during infusion. Now verbalizes early satiety.  12% weight loss over 3 months which is clinically significant.  Daughter requesting home PT to help patient increase strength. MD informed. Also inquires about help at home. Will contact SW for more information.  Nutrition Diagnosis: Food and Nutrition Related Knowledge Deficit continues Unintended wt loss related to cancer and associated treatments as evidenced by 12% wt loss over 3 months.  Intervention: Educated on importance of eating higher calorie foods and liquids more often throughout the day. Add shakes and ice cream and other favorite foods prn. Educated importance of eating small amounts of favorite foods throughout the day. Try to eat high protein foods. Add OJ to breakfast.  Monitoring, Evaluation, Goals: Patient will tolerate increased calories and protein to  minimize wt loss.  Next Visit: To be scheduled as needed.

## 2022-11-10 NOTE — Patient Instructions (Addendum)
[place]   The chemotherapy medication bag should finish at 46 hours, 96 hours, or 7 days. For example, if your pump is scheduled for 46 hours and it was put on at 4:00 p.m., it should finish at 2:00 p.m. the day it is scheduled to come off regardless of your appointment time.     Estimated time to finish at 12:15pm Friday November 12, 2022 at [facility].   If the display on your pump reads "Low Volume" and it is beeping, take the batteries out of the pump and come to the cancer center for it to be taken off.   If the pump alarms go off prior to the pump reading "Low Volume" then call [phone] and someone can assist you.  If the plunger comes out and the chemotherapy medication is leaking out, please use your home chemo spill kit to clean up the spill. Do NOT use paper towels or other household products.  If you have problems or questions regarding your pump, please call either [phone] (24 hours a day) or the cancer center Monday-Friday 8:00 a.m.- 4:30 p.m. at the clinic number and we will assist you. If you are unable to get assistance, then go to the nearest Emergency Department and ask the staff to contact the IV team for assistance.  Discharge Instructions: Thank you for choosing [facility] to provide your oncology and hematology care.   If you have a lab appointment with the [facility], please go directly to the [facility] and check in at the registration area.   Wear comfortable clothing and clothing appropriate for easy access to any Portacath or PICC line.   We strive to give you quality time with your provider. You may need to reschedule your appointment if you arrive late (15 or more minutes).  Arriving late affects you and other patients whose appointments are after yours.  Also, if you miss three or more appointments without notifying the office, you may be dismissed from the clinic at the provider's discretion.       For prescription refill requests, have your pharmacy contact our office and allow 72 hours for refills to be completed.    Today you received the following chemotherapy and/or immunotherapy agents Bevacizumab, Irinotecan, Leucovorin, Fluorouracil.      To help prevent nausea and vomiting after your treatment, we encourage you to take your nausea medication as directed.  BELOW ARE SYMPTOMS THAT SHOULD BE REPORTED IMMEDIATELY: *FEVER GREATER THAN 100.4 F (38 C) OR HIGHER *CHILLS OR SWEATING *NAUSEA AND VOMITING THAT IS NOT CONTROLLED WITH YOUR NAUSEA MEDICATION *UNUSUAL SHORTNESS OF BREATH *UNUSUAL BRUISING OR BLEEDING *URINARY PROBLEMS (pain or burning when urinating, or frequent urination) *BOWEL PROBLEMS (unusual diarrhea, constipation, pain near the anus) TENDERNESS IN MOUTH AND THROAT WITH OR WITHOUT PRESENCE OF ULCERS (sore throat, sores in mouth, or a toothache) UNUSUAL RASH, SWELLING OR PAIN  UNUSUAL VAGINAL DISCHARGE OR ITCHING   Items with * indicate a potential emergency and should be followed up as soon as possible or go to the Emergency Department if any problems should occur.  Please show the CHEMOTHERAPY ALERT CARD or IMMUNOTHERAPY ALERT CARD at check-in to the Emergency Department and triage nurse.  Should you have questions after your visit or need to cancel or reschedule your appointment, please contact [facility]  Dept: [phone]  and follow the prompts.  Office hours are 8:00 a.m. to 4:30 p.m. Monday -  Friday. Please note that voicemails left after 4:00 p.m. may not be returned until the following business day.  We are closed weekends and major holidays. You have access to a nurse at all times for urgent questions. Please call the main number to the clinic Dept: [phone] and follow the prompts.   For any non-urgent questions, you may also contact your provider using MyChart. We now offer e-Visits for anyone 28 and older to  request care online for non-urgent symptoms. For details visit [email or web address].   Also download the MyChart app! Go to the app store, search "MyChart", open the app, select [facility], and log in with your MyChart username and password.  Bevacizumab Injection What is this medication? BEVACIZUMAB (be va SIZ yoo mab) treats some types of cancer. It works by blocking a protein that causes cancer cells to grow and multiply. This helps to slow or stop the spread of cancer cells. It is a monoclonal antibody. This medicine may be used for other purposes; ask your health care provider or pharmacist if you have questions. COMMON BRAND NAME(S): Alymsys, Avastin, MVASI, Noah Charon What should I tell my care team before I take this medication? They need to know if you have any of these conditions: Blood clots Coughing up blood Having or recent surgery Heart failure High blood pressure History of a connection between 2 or more body parts that do not usually connect (fistula) History of a tear in your stomach or intestines Protein in your urine An unusual or allergic reaction to bevacizumab, other medications, foods, dyes, or preservatives Pregnant or trying to get pregnant Breast-feeding How should I use this medication? This medication is injected into a vein. It is given by your care team in a hospital or clinic setting. Talk to your care team the use of this medication in children. Special care may be needed. Overdosage: If you think you have taken too much of this medicine contact a poison control center or emergency room at once. NOTE: This medicine is only for you. Do not share this medicine with others. What if I miss a dose? Keep appointments for follow-up doses. It is important not to miss your dose. Call your care team if you are unable to keep an appointment. What may interact with this medication? Interactions are not expected. This list may not describe all possible interactions.  Give your health care provider a list of all the medicines, herbs, non-prescription drugs, or dietary supplements you use. Also tell them if you smoke, drink alcohol, or use illegal drugs. Some items may interact with your medicine. What should I watch for while using this medication? Your condition will be monitored carefully while you are receiving this medication. You may need blood work while taking this medication. This medication may make you feel generally unwell. This is not uncommon as chemotherapy can affect healthy cells as well as cancer cells. Report any side effects. Continue your course of treatment even though you feel ill unless your care team tells you to stop. This medication may increase your risk to bruise or bleed. Call your care team if you notice any unusual bleeding. Before having surgery, talk to your care team to make sure it is ok. This medication can increase the risk of poor healing of your surgical site or wound. You will need to stop this medication for 28 days before surgery. After surgery, wait at least 28 days before restarting this medication. Make sure the surgical site or wound is healed enough  before restarting this medication. Talk to your care team if questions. Talk to your care team if you may be pregnant. Serious birth defects can occur if you take this medication during pregnancy and for 6 months after the last dose. Contraception is recommended while taking this medication and for 6 months after the last dose. Your care team can help you find the option that works for you. Do not breastfeed while taking this medication and for 6 months after the last dose. This medication can cause infertility. Talk to your care team if you are concerned about your fertility. What side effects may I notice from receiving this medication? Side effects that you should report to your care team as soon as possible: Allergic reactions--skin rash, itching, hives, swelling of the  face, lips, tongue, or throat Bleeding--bloody or black, tar-like stools, vomiting blood or brown material that looks like coffee grounds, red or dark brown urine, small red or purple spots on skin, unusual bruising or bleeding Blood clot--pain, swelling, or warmth in the leg, shortness of breath, chest pain Heart attack--pain or tightness in the chest, shoulders, arms, or jaw, nausea, shortness of breath, cold or clammy skin, feeling faint or lightheaded Heart failure--shortness of breath, swelling of the ankles, feet, or hands, sudden weight gain, unusual weakness or fatigue Increase in blood pressure Infection--fever, chills, cough, sore throat, wounds that don't heal, pain or trouble when passing urine, general feeling of discomfort or being unwell Infusion reactions--chest pain, shortness of breath or trouble breathing, feeling faint or lightheaded Kidney injury--decrease in the amount of urine, swelling of the ankles, hands, or feet Stomach pain that is severe, does not go away, or gets worse Stroke--sudden numbness or weakness of the face, arm, or leg, trouble speaking, confusion, trouble walking, loss of balance or coordination, dizziness, severe headache, change in vision Sudden and severe headache, confusion, change in vision, seizures, which may be signs of posterior reversible encephalopathy syndrome (PRES) Side effects that usually do not require medical attention (report to your care team if they continue or are bothersome): Back pain Change in taste Diarrhea Dry skin Increased tears Nosebleed This list may not describe all possible side effects. Call your doctor for medical advice about side effects. You may report side effects to FDA at 1-800-FDA-1088. Where should I keep my medication? This medication is given in a hospital or clinic. It will not be stored at home. NOTE: This sheet is a summary. It may not cover all possible information. If you have questions about this  medicine, talk to your doctor, pharmacist, or health care provider.  2023 Elsevier/Gold Standard (2021-12-11 00:00:00)  Irinotecan Injection What is this medication? IRINOTECAN (ir in oh TEE kan) treats some types of cancer. It works by slowing down the growth of cancer cells. This medicine may be used for other purposes; ask your health care provider or pharmacist if you have questions. COMMON BRAND NAME(S): Camptosar What should I tell my care team before I take this medication? They need to know if you have any of these conditions: Dehydration Diarrhea Infection, especially a viral infection, such as chickenpox, cold sores, herpes Liver disease Low blood cell levels (white cells, red cells, and platelets) Low levels of electrolytes, such as calcium, magnesium, or potassium in your blood Recent or ongoing radiation An unusual or allergic reaction to irinotecan, other medications, foods, dyes, or preservatives If you or your partner are pregnant or trying to get pregnant Breast-feeding How should I use this medication? This medication  is injected into a vein. It is given by your care team in a hospital or clinic setting. Talk to your care team about the use of this medication in children. Special care may be needed. Overdosage: If you think you have taken too much of this medicine contact a poison control center or emergency room at once. NOTE: This medicine is only for you. Do not share this medicine with others. What if I miss a dose? Keep appointments for follow-up doses. It is important not to miss your dose. Call your care team if you are unable to keep an appointment. What may interact with this medication? Do not take this medication with any of the following: Cobicistat Itraconazole This medication may also interact with the following: Certain antibiotics, such as clarithromycin, rifampin, rifabutin Certain antivirals for HIV or AIDS Certain medications for fungal  infections, such as ketoconazole, posaconazole, voriconazole Certain medications for seizures, such as carbamazepine, phenobarbital, phenytoin Gemfibrozil Nefazodone St. John's wort This list may not describe all possible interactions. Give your health care provider a list of all the medicines, herbs, non-prescription drugs, or dietary supplements you use. Also tell them if you smoke, drink alcohol, or use illegal drugs. Some items may interact with your medicine. What should I watch for while using this medication? Your condition will be monitored carefully while you are receiving this medication. You may need blood work while taking this medication. This medication may make you feel generally unwell. This is not uncommon as chemotherapy can affect healthy cells as well as cancer cells. Report any side effects. Continue your course of treatment even though you feel ill unless your care team tells you to stop. This medication can cause serious side effects. To reduce the risk, your care team may give you other medications to take before receiving this one. Be sure to follow the directions from your care team. This medication may affect your coordination, reaction time, or judgement. Do not drive or operate machinery until you know how this medication affects you. Sit up or stand slowly to reduce the risk of dizzy or fainting spells. Drinking alcohol with this medication can increase the risk of these side effects. This medication may increase your risk of getting an infection. Call your care team for advice if you get a fever, chills, sore throat, or other symptoms of a cold or flu. Do not treat yourself. Try to avoid being around people who are sick. Avoid taking medications that contain aspirin, acetaminophen, ibuprofen, naproxen, or ketoprofen unless instructed by your care team. These medications may hide a fever. This medication may increase your risk to bruise or bleed. Call your care team if you  notice any unusual bleeding. Be careful brushing or flossing your teeth or using a toothpick because you may get an infection or bleed more easily. If you have any dental work done, tell your dentist you are receiving this medication. Talk to your care team if you or your partner are pregnant or think either of you might be pregnant. This medication can cause serious birth defects if taken during pregnancy and for 6 months after the last dose. You will need a negative pregnancy test before starting this medication. Contraception is recommended while taking this medication and for 6 months after the last dose. Your care team can help you find the option that works for you. Do not father a child while taking this medication and for 3 months after the last dose. Use a condom for contraception during  this time period. Do not breastfeed while taking this medication and for 7 days after the last dose. This medication may cause infertility. Talk to your care team if you are concerned about your fertility. What side effects may I notice from receiving this medication? Side effects that you should report to your care team as soon as possible: Allergic reactions--skin rash, itching, hives, swelling of the face, lips, tongue, or throat Dry cough, shortness of breath or trouble breathing Increased saliva or tears, increased sweating, stomach cramping, diarrhea, small pupils, unusual weakness or fatigue, slow heartbeat Infection--fever, chills, cough, sore throat, wounds that don't heal, pain or trouble when passing urine, general feeling of discomfort or being unwell Kidney injury--decrease in the amount of urine, swelling of the ankles, hands, or feet Low red blood cell level--unusual weakness or fatigue, dizziness, headache, trouble breathing Severe or prolonged diarrhea Unusual bruising or bleeding Side effects that usually do not require medical attention (report to your care team if they continue or are  bothersome): Constipation Diarrhea Hair loss Loss of appetite Nausea Stomach pain This list may not describe all possible side effects. Call your doctor for medical advice about side effects. You may report side effects to FDA at 1-800-FDA-1088. Where should I keep my medication? This medication is given in a hospital or clinic. It will not be stored at home. NOTE: This sheet is a summary. It may not cover all possible information. If you have questions about this medicine, talk to your doctor, pharmacist, or health care provider.  2023 Elsevier/Gold Standard (2021-12-17 00:00:00)  Leucovorin Injection What is this medication? LEUCOVORIN (loo koe VOR in) prevents side effects from certain medications, such as methotrexate. It works by increasing folate levels. This helps protect healthy cells in your body. It may also be used to treat anemia caused by low levels of folate. It can also be used with fluorouracil, a type of chemotherapy, to treat colorectal cancer. It works by increasing the effects of fluorouracil in the body. This medicine may be used for other purposes; ask your health care provider or pharmacist if you have questions. What should I tell my care team before I take this medication? They need to know if you have any of these conditions: Anemia from low levels of vitamin B12 in the blood An unusual or allergic reaction to leucovorin, folic acid, other medications, foods, dyes, or preservatives Pregnant or trying to get pregnant Breastfeeding How should I use this medication? This medication is injected into a vein or a muscle. It is given by your care team in a hospital or clinic setting. Talk to your care team about the use of this medication in children. Special care may be needed. Overdosage: If you think you have taken too much of this medicine contact a poison control center or emergency room at once. NOTE: This medicine is only for you. Do not share this medicine with  others. What if I miss a dose? Keep appointments for follow-up doses. It is important not to miss your dose. Call your care team if you are unable to keep an appointment. What may interact with this medication? Capecitabine Fluorouracil Phenobarbital Phenytoin Primidone Trimethoprim;sulfamethoxazole This list may not describe all possible interactions. Give your health care provider a list of all the medicines, herbs, non-prescription drugs, or dietary supplements you use. Also tell them if you smoke, drink alcohol, or use illegal drugs. Some items may interact with your medicine. What should I watch for while using this medication? Your  condition will be monitored carefully while you are receiving this medication. This medication may increase the side effects of 5-fluorouracil. Tell your care team if you have diarrhea or mouth sores that do not get better or that get worse. What side effects may I notice from receiving this medication? Side effects that you should report to your care team as soon as possible: Allergic reactions--skin rash, itching, hives, swelling of the face, lips, tongue, or throat This list may not describe all possible side effects. Call your doctor for medical advice about side effects. You may report side effects to FDA at 1-800-FDA-1088. Where should I keep my medication? This medication is given in a hospital or clinic. It will not be stored at home. NOTE: This sheet is a summary. It may not cover all possible information. If you have questions about this medicine, talk to your doctor, pharmacist, or health care provider.  2023 Elsevier/Gold Standard (2021-12-18 00:00:00)  Fluorouracil Injection What is this medication? FLUOROURACIL (flure oh YOOR a sil) treats some types of cancer. It works by slowing down the growth of cancer cells. This medicine may be used for other purposes; ask your health care provider or pharmacist if you have questions. COMMON BRAND  NAME(S): Adrucil What should I tell my care team before I take this medication? They need to know if you have any of these conditions: Blood disorders Dihydropyrimidine dehydrogenase (DPD) deficiency Infection, such as chickenpox, cold sores, herpes Kidney disease Liver disease Poor nutrition Recent or ongoing radiation therapy An unusual or allergic reaction to fluorouracil, other medications, foods, dyes, or preservatives If you or your partner are pregnant or trying to get pregnant Breast-feeding How should I use this medication? This medication is injected into a vein. It is administered by your care team in a hospital or clinic setting. Talk to your care team about the use of this medication in children. Special care may be needed. Overdosage: If you think you have taken too much of this medicine contact a poison control center or emergency room at once. NOTE: This medicine is only for you. Do not share this medicine with others. What if I miss a dose? Keep appointments for follow-up doses. It is important not to miss your dose. Call your care team if you are unable to keep an appointment. What may interact with this medication? Do not take this medication with any of the following: Live virus vaccines This medication may also interact with the following: Medications that treat or prevent blood clots, such as warfarin, enoxaparin, dalteparin This list may not describe all possible interactions. Give your health care provider a list of all the medicines, herbs, non-prescription drugs, or dietary supplements you use. Also tell them if you smoke, drink alcohol, or use illegal drugs. Some items may interact with your medicine. What should I watch for while using this medication? Your condition will be monitored carefully while you are receiving this medication. This medication may make you feel generally unwell. This is not uncommon as chemotherapy can affect healthy cells as well as  cancer cells. Report any side effects. Continue your course of treatment even though you feel ill unless your care team tells you to stop. In some cases, you may be given additional medications to help with side effects. Follow all directions for their use. This medication may increase your risk of getting an infection. Call your care team for advice if you get a fever, chills, sore throat, or other symptoms of a cold or  flu. Do not treat yourself. Try to avoid being around people who are sick. This medication may increase your risk to bruise or bleed. Call your care team if you notice any unusual bleeding. Be careful brushing or flossing your teeth or using a toothpick because you may get an infection or bleed more easily. If you have any dental work done, tell your dentist you are receiving this medication. Avoid taking medications that contain aspirin, acetaminophen, ibuprofen, naproxen, or ketoprofen unless instructed by your care team. These medications may hide a fever. Do not treat diarrhea with over the counter products. Contact your care team if you have diarrhea that lasts more than 2 days or if it is severe and watery. This medication can make you more sensitive to the sun. Keep out of the sun. If you cannot avoid being in the sun, wear protective clothing and sunscreen. Do not use sun lamps, tanning beds, or tanning booths. Talk to your care team if you or your partner wish to become pregnant or think you might be pregnant. This medication can cause serious birth defects if taken during pregnancy and for 3 months after the last dose. A reliable form of contraception is recommended while taking this medication and for 3 months after the last dose. Talk to your care team about effective forms of contraception. Do not father a child while taking this medication and for 3 months after the last dose. Use a condom while having sex during this time period. Do not breastfeed while taking this  medication. This medication may cause infertility. Talk to your care team if you are concerned about your fertility. What side effects may I notice from receiving this medication? Side effects that you should report to your care team as soon as possible: Allergic reactions--skin rash, itching, hives, swelling of the face, lips, tongue, or throat Heart attack--pain or tightness in the chest, shoulders, arms, or jaw, nausea, shortness of breath, cold or clammy skin, feeling faint or lightheaded Heart failure--shortness of breath, swelling of the ankles, feet, or hands, sudden weight gain, unusual weakness or fatigue Heart rhythm changes--fast or irregular heartbeat, dizziness, feeling faint or lightheaded, chest pain, trouble breathing High ammonia level--unusual weakness or fatigue, confusion, loss of appetite, nausea, vomiting, seizures Infection--fever, chills, cough, sore throat, wounds that don't heal, pain or trouble when passing urine, general feeling of discomfort or being unwell Low red blood cell level--unusual weakness or fatigue, dizziness, headache, trouble breathing Pain, tingling, or numbness in the hands or feet, muscle weakness, change in vision, confusion or trouble speaking, loss of balance or coordination, trouble walking, seizures Redness, swelling, and blistering of the skin over hands and feet Severe or prolonged diarrhea Unusual bruising or bleeding Side effects that usually do not require medical attention (report to your care team if they continue or are bothersome): Dry skin Headache Increased tears Nausea Pain, redness, or swelling with sores inside the mouth or throat Sensitivity to light Vomiting This list may not describe all possible side effects. Call your doctor for medical advice about side effects. You may report side effects to FDA at 1-800-FDA-1088. Where should I keep my medication? This medication is given in a hospital or clinic. It will not be stored  at home. NOTE: This sheet is a summary. It may not cover all possible information. If you have questions about this medicine, talk to your doctor, pharmacist, or health care provider.  2023 Elsevier/Gold Standard (2021-12-08 00:00:00)

## 2022-11-10 NOTE — Telephone Encounter (Signed)
Family had requested home health for physical therapy--OK per Dr. Benay Spice. Patient is weak and deconditioned. Has had falls as well.  Also asking about increasing dose of Megace for appetite. Will not increase dose due to increased risk of blood clots. Will refill her Prednisone 10 mg daily. Daughter made aware.

## 2022-11-10 NOTE — Progress Notes (Signed)
Per Dr. Benay Spice: OK to proceed w/treatment today with urine protein 100 and pulse 98 after IVF.

## 2022-11-10 NOTE — Progress Notes (Signed)
Patient seen by Dr. Benay Spice today  Vitals are not all within treatment parameters. Pulse rate 102-112  Labs reviewed by Dr. Benay Spice and are within treatment parameters. Still needs urine protein to treat  Per physician team, patient is ready for treatment. Please note that modifications are being made to the treatment plan including Please give 500 ml NS over 1 hour and recheck pulse rate.

## 2022-11-10 NOTE — Patient Instructions (Signed)

## 2022-11-11 ENCOUNTER — Ambulatory Visit: Payer: Medicare Other

## 2022-11-11 ENCOUNTER — Other Ambulatory Visit: Payer: Medicare Other

## 2022-11-11 ENCOUNTER — Ambulatory Visit: Payer: Medicare Other | Admitting: Nurse Practitioner

## 2022-11-12 ENCOUNTER — Telehealth: Payer: Self-pay

## 2022-11-12 ENCOUNTER — Other Ambulatory Visit: Payer: Self-pay

## 2022-11-12 ENCOUNTER — Inpatient Hospital Stay: Payer: Medicare Other

## 2022-11-12 VITALS — BP 121/86 | HR 87 | Temp 99.0°F

## 2022-11-12 DIAGNOSIS — Z5112 Encounter for antineoplastic immunotherapy: Secondary | ICD-10-CM | POA: Diagnosis not present

## 2022-11-12 DIAGNOSIS — C182 Malignant neoplasm of ascending colon: Secondary | ICD-10-CM

## 2022-11-12 MED ORDER — PEGFILGRASTIM-CBQV 6 MG/0.6ML ~~LOC~~ SOSY
6.0000 mg | PREFILLED_SYRINGE | Freq: Once | SUBCUTANEOUS | Status: AC
  Administered 2022-11-12: 6 mg via SUBCUTANEOUS
  Filled 2022-11-12: qty 0.6

## 2022-11-12 MED ORDER — SODIUM CHLORIDE 0.9% FLUSH
10.0000 mL | INTRAVENOUS | Status: DC | PRN
  Administered 2022-11-12: 10 mL

## 2022-11-12 MED ORDER — HEPARIN SOD (PORK) LOCK FLUSH 100 UNIT/ML IV SOLN
500.0000 [IU] | Freq: Once | INTRAVENOUS | Status: AC | PRN
  Administered 2022-11-12: 500 [IU]

## 2022-11-12 NOTE — Telephone Encounter (Signed)
CSW attempted to contact patient's daughter, per her request. for assistance for her mom at home.  Left vm.

## 2022-11-15 ENCOUNTER — Other Ambulatory Visit (HOSPITAL_COMMUNITY): Payer: Self-pay

## 2022-11-16 ENCOUNTER — Encounter (HOSPITAL_COMMUNITY): Payer: Self-pay

## 2022-11-16 ENCOUNTER — Telehealth: Payer: Self-pay

## 2022-11-16 ENCOUNTER — Other Ambulatory Visit: Payer: Self-pay

## 2022-11-16 ENCOUNTER — Telehealth: Payer: Self-pay | Admitting: Internal Medicine

## 2022-11-16 ENCOUNTER — Emergency Department (HOSPITAL_COMMUNITY)
Admission: EM | Admit: 2022-11-16 | Discharge: 2022-11-16 | Disposition: A | Discharge status: Home / Self Care | Payer: Medicare Other | Attending: Emergency Medicine | Admitting: Emergency Medicine

## 2022-11-16 ENCOUNTER — Emergency Department (HOSPITAL_COMMUNITY): Payer: Medicare Other

## 2022-11-16 DIAGNOSIS — W44F3XA Food entering into or through a natural orifice, initial encounter: Secondary | ICD-10-CM | POA: Diagnosis not present

## 2022-11-16 DIAGNOSIS — Z7901 Long term (current) use of anticoagulants: Secondary | ICD-10-CM | POA: Diagnosis not present

## 2022-11-16 DIAGNOSIS — C189 Malignant neoplasm of colon, unspecified: Secondary | ICD-10-CM | POA: Diagnosis not present

## 2022-11-16 DIAGNOSIS — Z86718 Personal history of other venous thrombosis and embolism: Secondary | ICD-10-CM | POA: Insufficient documentation

## 2022-11-16 DIAGNOSIS — M542 Cervicalgia: Secondary | ICD-10-CM | POA: Diagnosis present

## 2022-11-16 DIAGNOSIS — T18128A Food in esophagus causing other injury, initial encounter: Secondary | ICD-10-CM

## 2022-11-16 LAB — CBC
HCT: 28.9 % — ABNORMAL LOW (ref 36.0–46.0)
Hemoglobin: 8.9 g/dL — ABNORMAL LOW (ref 12.0–15.0)
MCH: 26.7 pg (ref 26.0–34.0)
MCHC: 30.8 g/dL (ref 30.0–36.0)
MCV: 86.8 fL (ref 80.0–100.0)
Platelets: 336 10*3/uL (ref 150–400)
RBC: 3.33 MIL/uL — ABNORMAL LOW (ref 3.87–5.11)
RDW: 16.5 % — ABNORMAL HIGH (ref 11.5–15.5)
WBC: 6.4 10*3/uL (ref 4.0–10.5)
nRBC: 0 % (ref 0.0–0.2)

## 2022-11-16 LAB — BASIC METABOLIC PANEL
Anion gap: 9 (ref 5–15)
BUN: 8 mg/dL (ref 8–23)
CO2: 22 mmol/L (ref 22–32)
Calcium: 8.4 mg/dL — ABNORMAL LOW (ref 8.9–10.3)
Chloride: 103 mmol/L (ref 98–111)
Creatinine, Ser: 0.73 mg/dL (ref 0.44–1.00)
GFR, Estimated: >60 mL/min (ref >60)
Glucose, Bld: 96 mg/dL (ref 70–99)
Potassium: 3.6 mmol/L (ref 3.5–5.1)
Sodium: 134 mmol/L — ABNORMAL LOW (ref 135–145)

## 2022-11-16 LAB — SAMPLE TO BLOOD BANK

## 2022-11-16 MED ORDER — GLUCAGON HCL RDNA (DIAGNOSTIC) 1 MG IJ SOLR
1.0000 mg | Freq: Once | INTRAMUSCULAR | Status: AC
  Administered 2022-11-16: 1 mg via INTRAVENOUS
  Filled 2022-11-16: qty 1

## 2022-11-16 MED ORDER — METOCLOPRAMIDE HCL 5 MG/ML IJ SOLN
5.0000 mg | Freq: Once | INTRAMUSCULAR | Status: AC
  Administered 2022-11-16: 5 mg via INTRAVENOUS
  Filled 2022-11-16: qty 2

## 2022-11-16 NOTE — Telephone Encounter (Signed)
Patient's daughter called the after hour line states Christina [name] is shortness of breath. Patient is unable to swallow, something stuck in throat, restrict airways. Patient was eating a biscuit. Patient was advised to go the ED and get the biscuit removed. I called several time for an updated no answer and I left a message to give [place] a call back.

## 2022-11-16 NOTE — Discharge Instructions (Signed)
You were seen for the food that was struck in your throat in the emergency department.   At home, please adhere to a clear liquid diet for the next 24 hours.    Follow-up with your primary doctor in 2-3 days regarding your visit.  The GI doctors will be calling to set up a swallow study for you.  If you do not hear from them by tomorrow afternoon please call Dr. Henrene Pastor with the information listed in this packet.  Return immediately to the emergency department if you experience any of the following: Inability to swallow your saliva, severe neck pain, difficulty breathing, or any other concerning symptoms.    Thank you for visiting our Emergency Department. It was a pleasure taking care of you today.

## 2022-11-16 NOTE — ED Notes (Signed)
Pt resting in bed with no acute distress noted at this time.  

## 2022-11-16 NOTE — ED Provider Notes (Signed)
[place] EMERGENCY DEPARTMENT AT [facility] Provider Note   CSN: [id number] Arrival date & time: 11/16/22  1717     History  Chief Complaint  Patient presents with   Food Bolus    Christina [name] is a 77 y.o. female.  77 year old female with a history of colon cancer on chemotherapy, DVT on Eliquis, and breast cancer in remission who presents to the emergency department with neck pain.  Patient reports that she was eating a biscuit yesterday at 1:30 PM with jelly.  She feels like it got stuck in the bottom of her throat near the base of her neck.  Has had some difficulty swallowing since then.  No voice changes or difficulty breathing.  Does not have a history of similar symptoms.  No head or neck cancer.       Home Medications Prior to Admission medications   Medication Sig Start Date End Date Taking? Authorizing Provider  acetaminophen (TYLENOL) 500 MG tablet Take 500 mg by mouth every 6 (six) hours as needed for fever.    [provider]  APIXABAN Arne Cleveland) VTE STARTER PACK (10MG  AND 5MG ) Take as directed on package: start with two-5mg  tablets twice daily for 7 days. On day 8, switch to one-5mg  tablet twice daily. 10/27/22   Owens Shark, NP  HYDROmorphone (DILAUDID) 2 MG tablet Take 1 tablet (2 mg total) by mouth every 6 (six) hours as needed for severe pain or moderate pain. Patient not taking: Reported on 10/27/2022 07/21/22   Ladell Pier, MD  lidocaine-prilocaine (EMLA) cream Apply 1 Application topically as needed. Patient not taking: Reported on 10/27/2022 07/21/22   Ladell Pier, MD  megestrol (MEGACE) 400 MG/10ML suspension Take 5 mLs (200 mg total) by mouth 2 (two) times daily. 10/27/22   Owens Shark, NP  olopatadine (PATADAY) 0.1 % ophthalmic solution Place 1 drop into both eyes 2 times daily. Patient not taking: Reported on 06/22/2022 12/02/21   Raspet, Junie Panning K, PA-C  potassium chloride SA (KLOR-CON M) 20 MEQ tablet Take 1 tablet (20 mEq  total) by mouth 2 (two) times daily for 3 days, THEN 1 tablet (20 mEq total) daily 08/24/22 11/10/22  Owens Shark, NP  predniSONE (DELTASONE) 10 MG tablet Take 1 tablet (10 mg total) by mouth daily with breakfast. 11/10/22   Ladell Pier, MD  prochlorperazine (COMPAZINE) 10 MG tablet Take 1/2 - 1 tablet (5 - 10 mg total) by mouth every 6  hours as needed for nausea or vomiting. Patient not taking: Reported on 10/27/2022 08/24/22   Owens Shark, NP  losartan-hydrochlorothiazide (HYZAAR) 100-25 MG tablet Take 1 tablet by mouth daily. 12/31/20 06/03/21        Allergies    Zofran [ondansetron hcl], Onion, Simvastatin, Aspirin, and Hydrocodone    Review of Systems   Review of Systems  Physical Exam Updated Vital Signs BP 134/82   Pulse 84   Temp 99.2 F (37.3 C)   Resp 17   Ht 5\' 6"  (1.676 m)   Wt 55 kg   SpO2 100%   BMI 19.57 kg/m  Physical Exam Vitals and nursing note reviewed.  Constitutional:      General: She is not in acute distress.    Appearance: She is well-developed.     Comments: Appears to be tolerating her secretions.  HENT:     Head: Normocephalic and atraumatic.     Right Ear: External ear normal.     Left Ear: External  ear normal.     Nose: Nose normal.     Mouth/Throat:     Pharynx: No oropharyngeal exudate or posterior oropharyngeal erythema.     Comments: No foreign bodies noted in oropharynx Eyes:     Extraocular Movements: Extraocular movements intact.     Conjunctiva/sclera: Conjunctivae normal.     Pupils: Pupils are equal, round, and reactive to light.  Pulmonary:     Effort: Pulmonary effort is normal. No respiratory distress.     Breath sounds: Normal breath sounds. No stridor.  Musculoskeletal:     Cervical back: Normal range of motion and neck supple.  Neurological:     Mental Status: She is alert.     ED Results / Procedures / Treatments   Labs (all labs ordered are listed, but only abnormal results are displayed) Labs Reviewed  CBC -  Abnormal; Notable for the following components:      Result Value   RBC 3.33 (*)    Hemoglobin 8.9 (*)    HCT 28.9 (*)    RDW 16.5 (*)    All other components within normal limits  BASIC METABOLIC PANEL - Abnormal; Notable for the following components:   Sodium 134 (*)    Calcium 8.4 (*)    All other components within normal limits  SAMPLE TO BLOOD BANK    EKG None  Radiology DG Neck Soft Tissue  Result Date: 11/16/2022 CLINICAL DATA:  Food bolus and neck. EXAM: NECK SOFT TISSUES - 1+ VIEW COMPARISON:  None Available. FINDINGS: There is no evidence of retropharyngeal soft tissue swelling or epiglottic enlargement. The cervical airway is unremarkable and no radio-opaque foreign body identified. Chest port is visualized on the right. IMPRESSION: Negative. Electronically Signed   By: Ronney Asters M.D.   On: 11/16/2022 19:15    Procedures Procedures   Medications Ordered in ED Medications  glucagon (human recombinant) (GLUCAGEN) injection 1 mg (1 mg Intravenous Given 11/16/22 1923)  metoCLOPramide (REGLAN) injection 5 mg (5 mg Intravenous Given 11/16/22 1923)    ED Course/ Medical Decision Making/ A&P Clinical Course as of 11/16/22 2301  Tue Nov 16, 2022  2045 Dr Henrene Pastor from GI consulted.  Feels that patient's sensation is likely due to irritation of her esophagus rather than true food impaction.  Will have her follow-up in clinic for a swallow study. [RP]    Clinical Course User Index [RP] Fransico Meadow, MD                            Medical Decision Making Amount and/or Complexity of Data Reviewed Labs: ordered. Radiology: ordered.  Risk Prescription drug management.   Christina [name] is a 77 y.o. female with comorbidities that complicate the patient evaluation including colon cancer on chemotherapy, DVT on Eliquis, and breast cancer in remission who presents to the emergency department with concerns for food impaction  Initial Ddx:  Food impaction, esophageal  irritation, diverticulum, mass  MDM:  Feel the patient may have a food impaction given her symptoms.  Appears to be tolerating secretions at this time.  Will give her glucagon and Reglan for her possible impaction and talk to GI after obtaining neck x-rays.  If this is negative could potentially be from a diverticulum of her pharynx or esophagus or a mass.  Plan:  Labs Neck x-ray Glucagon Reglan GI consult  ED Summary/Re-evaluation:  Patient was feeling better after the glucagon and Reglan.  Talk to GI  who recommended that she trial p.o.  After swallowing water she felt that the foreign body sensation had resolved.  Dr. Henrene Pastor from GI recommended that she follow-up in clinic for a swallow study which she will call her about.  Also instructed her to follow-up with her primary doctor in several days regarding her symptoms.  This patient presents to the ED for concern of complaints listed in HPI, this involves an extensive number of treatment options, and is a complaint that carries with it a high risk of complications and morbidity. Disposition including potential need for admission considered.   Dispo: DC Home. Return precautions discussed including, but not limited to, those listed in the AVS. Allowed pt time to ask questions which were answered fully prior to dc.  Additional history obtained from daughter Records reviewed Outpatient Clinic Notes The following labs were independently interpreted: Chemistry and show no acute abnormality I independently reviewed the following imaging with scope of interpretation limited to determining acute life threatening conditions related to emergency care:  neck x-ray  and agree with the radiologist interpretation with the following exceptions: none I personally reviewed and interpreted cardiac monitoring: normal sinus rhythm  I personally reviewed and interpreted the pt's EKG: see above for interpretation  I have reviewed the patients home medications  and made adjustments as needed Consults: Gastroenterology Social Determinants of health:  Elderly   Final Clinical Impression(s) / ED Diagnoses Final diagnoses:  Food impaction of esophagus, initial encounter    Rx / DC Orders ED Discharge Orders     None         Fransico Meadow, MD 11/16/22 [phone]

## 2022-11-16 NOTE — Telephone Encounter (Signed)
GI ON CALL NOTE  EDP called with food impaction, which passed. Complex patient of Dr. Dayle Points with metastatic colon cancer and DVT on Eliquis. Told to stay on clears only overnight and we would reach out in am with additional plans.  Nurse, Please contact the patient and set up a Barium swallow "dysphagia, r/o stricture".  Christina [name]. Christina [name]., M.D. [facility] Division of Gastroenterology

## 2022-11-16 NOTE — ED Triage Notes (Signed)
Patient has a piece of biscuit stuck in her throat from lunch yesterday. She said it tasted like an old biscuit. Still able to swallow. Has not vomited.

## 2022-11-17 ENCOUNTER — Other Ambulatory Visit: Payer: Medicare Other

## 2022-11-17 ENCOUNTER — Ambulatory Visit: Payer: Medicare Other | Admitting: Oncology

## 2022-11-17 ENCOUNTER — Ambulatory Visit: Payer: Medicare Other

## 2022-11-17 ENCOUNTER — Other Ambulatory Visit: Payer: Self-pay

## 2022-11-17 DIAGNOSIS — R1310 Dysphagia, unspecified: Secondary | ICD-10-CM

## 2022-11-17 NOTE — Telephone Encounter (Signed)
Pt scheduled for barium swallow at [facility] 11/22/22 at 9:30am, pt to arrive there at [place] with pts daughter and she needs the appt moved to a date that she is off work. Daughter given phone number to rad scheduling [phone] to call and reschedule the exam to a date that works for them.

## 2022-11-19 ENCOUNTER — Other Ambulatory Visit (HOSPITAL_COMMUNITY): Payer: Self-pay

## 2022-11-20 ENCOUNTER — Other Ambulatory Visit (HOSPITAL_COMMUNITY): Payer: Self-pay

## 2022-11-21 ENCOUNTER — Other Ambulatory Visit: Payer: Self-pay | Admitting: Oncology

## 2022-11-21 DIAGNOSIS — C182 Malignant neoplasm of ascending colon: Secondary | ICD-10-CM

## 2022-11-22 ENCOUNTER — Other Ambulatory Visit (HOSPITAL_COMMUNITY): Payer: Self-pay

## 2022-11-22 ENCOUNTER — Encounter: Payer: Self-pay | Admitting: Oncology

## 2022-11-22 ENCOUNTER — Other Ambulatory Visit: Payer: Self-pay

## 2022-11-22 ENCOUNTER — Ambulatory Visit (HOSPITAL_COMMUNITY): Payer: Medicare Other

## 2022-11-22 ENCOUNTER — Telehealth: Payer: Self-pay

## 2022-11-22 DIAGNOSIS — C182 Malignant neoplasm of ascending colon: Secondary | ICD-10-CM

## 2022-11-22 MED ORDER — POTASSIUM CHLORIDE 40 MEQ/15ML (20%) PO SOLN
7.5000 mL | Freq: Two times a day (BID) | ORAL | 1 refills | Status: DC
  Filled 2022-11-22: qty 225, 15d supply, fill #0

## 2022-11-22 MED ORDER — MEGESTROL ACETATE 400 MG/10ML PO SUSP
200.0000 mg | Freq: Two times a day (BID) | ORAL | 0 refills | Status: DC
  Filled 2022-11-22: qty 240, 24d supply, fill #0

## 2022-11-22 NOTE — Telephone Encounter (Signed)
The patient's daughter contacted the access nurse line to request a refill of her mother's medications, potassium and metformin in liquid form, as she is completely out. She mentioned that her mother has been experiencing weight loss without the metformin.   After conducting a follow-up call with the patient, it was found that she is not currently taking metformin as per her medication list. The daughter had confused the medications and actually needed a refill of megace and potassium. She requested that the medications be sent to [place].  I assured the patient's daughter that I would work on arranging the medication refill. She mentioned that her mother is not eating well, and asked for advice on how to improve her appetite. I suggested preparing her favorite foods in small portions, avoiding greasy or fried foods. The daughter also inquired about the possibility of a feeding tube for her mother.  The patient is scheduled to come in on Wednesday, and I recommended that she discuss the option of a feeding tube with the provider during the visit.

## 2022-11-23 ENCOUNTER — Other Ambulatory Visit: Payer: Self-pay

## 2022-11-23 ENCOUNTER — Inpatient Hospital Stay (HOSPITAL_COMMUNITY)
Admission: RE | Admit: 2022-11-23 | Discharge: 2022-11-23 | Disposition: A | Discharge status: Home / Self Care | Payer: Medicare Other | Source: Ambulatory Visit | Attending: Internal Medicine | Admitting: Internal Medicine

## 2022-11-24 ENCOUNTER — Other Ambulatory Visit (HOSPITAL_COMMUNITY): Payer: Self-pay

## 2022-11-24 ENCOUNTER — Ambulatory Visit (HOSPITAL_COMMUNITY)
Admission: RE | Admit: 2022-11-24 | Discharge: 2022-11-24 | Disposition: A | Discharge status: Home / Self Care | Payer: Medicare Other | Source: Ambulatory Visit | Attending: Internal Medicine | Admitting: Internal Medicine

## 2022-11-24 ENCOUNTER — Telehealth: Payer: Self-pay

## 2022-11-24 DIAGNOSIS — R1310 Dysphagia, unspecified: Secondary | ICD-10-CM

## 2022-11-24 NOTE — Telephone Encounter (Signed)
Spoke with patient's daughter. She stated that a Tues, Wednesday , or Thursday would be good for CT chest and that June 10 is good for the EGD.

## 2022-11-25 ENCOUNTER — Inpatient Hospital Stay: Payer: Medicare Other

## 2022-11-25 ENCOUNTER — Encounter: Payer: Self-pay | Admitting: Nurse Practitioner

## 2022-11-25 ENCOUNTER — Inpatient Hospital Stay: Payer: Medicare Other | Attending: Oncology

## 2022-11-25 ENCOUNTER — Other Ambulatory Visit: Payer: Self-pay

## 2022-11-25 ENCOUNTER — Inpatient Hospital Stay (HOSPITAL_BASED_OUTPATIENT_CLINIC_OR_DEPARTMENT_OTHER): Payer: Medicare Other | Admitting: Nurse Practitioner

## 2022-11-25 VITALS — BP 116/90 | HR 100 | Temp 98.1°F | Resp 18 | Ht 66.0 in | Wt 115.0 lb

## 2022-11-25 DIAGNOSIS — K3580 Unspecified acute appendicitis: Secondary | ICD-10-CM | POA: Diagnosis not present

## 2022-11-25 DIAGNOSIS — I82412 Acute embolism and thrombosis of left femoral vein: Secondary | ICD-10-CM | POA: Insufficient documentation

## 2022-11-25 DIAGNOSIS — C180 Malignant neoplasm of cecum: Secondary | ICD-10-CM | POA: Diagnosis present

## 2022-11-25 DIAGNOSIS — Z79899 Other long term (current) drug therapy: Secondary | ICD-10-CM | POA: Insufficient documentation

## 2022-11-25 DIAGNOSIS — Z853 Personal history of malignant neoplasm of breast: Secondary | ICD-10-CM | POA: Diagnosis not present

## 2022-11-25 DIAGNOSIS — K224 Dyskinesia of esophagus: Secondary | ICD-10-CM | POA: Insufficient documentation

## 2022-11-25 DIAGNOSIS — C182 Malignant neoplasm of ascending colon: Secondary | ICD-10-CM

## 2022-11-25 DIAGNOSIS — I82432 Acute embolism and thrombosis of left popliteal vein: Secondary | ICD-10-CM | POA: Diagnosis not present

## 2022-11-25 DIAGNOSIS — I10 Essential (primary) hypertension: Secondary | ICD-10-CM | POA: Diagnosis not present

## 2022-11-25 DIAGNOSIS — R933 Abnormal findings on diagnostic imaging of other parts of digestive tract: Secondary | ICD-10-CM

## 2022-11-25 DIAGNOSIS — Z95828 Presence of other vascular implants and grafts: Secondary | ICD-10-CM

## 2022-11-25 DIAGNOSIS — I82452 Acute embolism and thrombosis of left peroneal vein: Secondary | ICD-10-CM | POA: Diagnosis not present

## 2022-11-25 DIAGNOSIS — R9389 Abnormal findings on diagnostic imaging of other specified body structures: Secondary | ICD-10-CM

## 2022-11-25 DIAGNOSIS — R1310 Dysphagia, unspecified: Secondary | ICD-10-CM

## 2022-11-25 LAB — CBC WITH DIFFERENTIAL (CANCER CENTER ONLY)
Abs Immature Granulocytes: 0.55 10*3/uL — ABNORMAL HIGH (ref 0.00–0.07)
Basophils Absolute: 0.1 10*3/uL (ref 0.0–0.1)
Basophils Relative: 1 %
Eosinophils Absolute: 0.1 10*3/uL (ref 0.0–0.5)
Eosinophils Relative: 1 %
HCT: 31 % — ABNORMAL LOW (ref 36.0–46.0)
Hemoglobin: 9.5 g/dL — ABNORMAL LOW (ref 12.0–15.0)
Immature Granulocytes: 4 %
Lymphocytes Relative: 20 %
Lymphs Abs: 2.5 10*3/uL (ref 0.7–4.0)
MCH: 26.5 pg (ref 26.0–34.0)
MCHC: 30.6 g/dL (ref 30.0–36.0)
MCV: 86.4 fL (ref 80.0–100.0)
Monocytes Absolute: 0.7 10*3/uL (ref 0.1–1.0)
Monocytes Relative: 6 %
Neutro Abs: 8.5 10*3/uL — ABNORMAL HIGH (ref 1.7–7.7)
Neutrophils Relative %: 68 %
Platelet Count: 369 10*3/uL (ref 150–400)
RBC: 3.59 MIL/uL — ABNORMAL LOW (ref 3.87–5.11)
RDW: 17.4 % — ABNORMAL HIGH (ref 11.5–15.5)
WBC Count: 12.4 10*3/uL — ABNORMAL HIGH (ref 4.0–10.5)
nRBC: 0.7 % — ABNORMAL HIGH (ref 0.0–0.2)

## 2022-11-25 LAB — CMP (CANCER CENTER ONLY)
ALT: 6 U/L (ref 0–44)
AST: 18 U/L (ref 15–41)
Albumin: 3.4 g/dL — ABNORMAL LOW (ref 3.5–5.0)
Alkaline Phosphatase: 144 U/L — ABNORMAL HIGH (ref 38–126)
Anion gap: 12 (ref 5–15)
BUN: 14 mg/dL (ref 8–23)
CO2: 22 mmol/L (ref 22–32)
Calcium: 9.1 mg/dL (ref 8.9–10.3)
Chloride: 109 mmol/L (ref 98–111)
Creatinine: 0.96 mg/dL (ref 0.44–1.00)
GFR, Estimated: >60 mL/min (ref >60)
Glucose, Bld: 104 mg/dL — ABNORMAL HIGH (ref 70–99)
Potassium: 3.3 mmol/L — ABNORMAL LOW (ref 3.5–5.1)
Sodium: 143 mmol/L (ref 135–145)
Total Bilirubin: 0.6 mg/dL (ref 0.3–1.2)
Total Protein: 6.4 g/dL — ABNORMAL LOW (ref 6.5–8.1)

## 2022-11-25 LAB — CEA (ACCESS): CEA (CHCC): 3032.05 ng/mL — ABNORMAL HIGH (ref 0.00–5.00)

## 2022-11-25 MED ORDER — HEPARIN SOD (PORK) LOCK FLUSH 100 UNIT/ML IV SOLN
500.0000 [IU] | Freq: Once | INTRAVENOUS | Status: AC
  Administered 2022-11-25: 500 [IU] via INTRAVENOUS

## 2022-11-25 MED ORDER — SODIUM CHLORIDE 0.9% FLUSH
10.0000 mL | INTRAVENOUS | Status: AC | PRN
  Administered 2022-11-25: 10 mL via INTRAVENOUS

## 2022-11-25 NOTE — Progress Notes (Signed)
[facility] OFFICE PROGRESS NOTE   Diagnosis: Colon cancer  INTERVAL HISTORY:   Christina [name] returns as scheduled.  She completed cycle 3 FOLFIRI/Avastin 11/10/2022.  She denies nausea/vomiting.  No mouth sores.  2 loose stools a day.  Occasional blood on the toilet tissue after a bowel movement.  She denies pain.  Appetite remains poor.  She is losing weight.  Her daughter reports she is very weak.  Objective:  Vital signs in last 24 hours:  Blood pressure (!) 116/90, pulse 100, temperature 98.1 F (36.7 C), temperature source Oral, resp. rate 18, height 5\' 6"  (1.676 m), weight 115 lb (52.2 kg), SpO2 99 %.    HEENT: No thrush or ulcers. Resp: Lungs clear bilaterally. Cardio: Regular rate and rhythm. GI: No hepatosplenomegaly. Vascular: No leg edema. Skin: No rash. Port-A-Cath without erythema.  Lab Results:  Lab Results  Component Value Date   WBC 12.4 (H) 11/25/2022   HGB 9.5 (L) 11/25/2022   HCT 31.0 (L) 11/25/2022   MCV 86.4 11/25/2022   PLT 369 11/25/2022   NEUTROABS 8.5 (H) 11/25/2022    Imaging:  DG ESOPHAGUS W SINGLE CM (SOL OR THIN BA)  Result Date: 11/24/2022 CLINICAL DATA:  Patient with recent food impaction requiring visit to the ED. Barium study requested for further evaluation. EXAM: ESOPHAGUS/BARIUM SWALLOW/TABLET STUDY TECHNIQUE: Single contrast examination was performed using thin liquid barium. This exam was performed by Soyla Dryer, NP, and was supervised and interpreted by Dr. Maree Erie. FLUOROSCOPY: Radiation Exposure Index (as provided by the fluoroscopic device): 7.40 mGy Kerma COMPARISON:  None Available. FINDINGS: Swallowing: Appears normal. No vestibular penetration or aspiration seen. Pharynx: Unremarkable. Esophagus: Normal appearance.  No mass or stricture observed. Esophageal motility: Dysmotility with tertiary contractions. Hiatal Hernia: None. Gastroesophageal reflux: None visualized. Ingested 54mm barium tablet: Became stuck in  the mid to lower esophagus. However, there is no fixed mass or stricture in this location. Swallowed contrast passes around the tablet. This arrest is probably due to poor peristaltic motility. Other: None IMPRESSION: Limited study due to patient mobility. No aspiration observed, esophagus is without mass or stricture. Esophageal dysmotility with tertiary contractions. 13 mm barium tablet became stuck in the mid to lower esophagus. Multiple attempts made with water and barium were unsuccessful in getting the pill into the stomach, though swallowed material passes easily around tablet. Read by: Soyla Dryer, NP Electronically Signed   By: Nelson Chimes M.D.   On: 11/24/2022 13:34    Medications: I have reviewed the patient's current medications.  Assessment/Plan: Metastatic colon cancer CT abdomen/pelvis 06/22/2022, interval appendectomy, extensive interstitial and extraluminal gas surrounding the surgical staples at the resection margin, extensive cecal wall and terminal ileum thickening, pathologic adenopathy in the ileocolic lymph nodes, interval development of by lobar hypoenhancing hepatic masses Ultrasound-guided biopsy of left liver mass 06/28/2022-static adenocarcinoma, CDX2 and CK20 positive; microsatellite stable, tumor mutation burden 4, K-ras G13D Colonoscopy 07/19/2022-malignant tumor in the cecum-moderately differentiated adenocarcinoma with mucinous features and background of high-grade dysplasia, ileocecal valve polyp-negative for malignancy Cycle 1 FOLFOX 07/28/2022 Cycle 2 FOLFOX 08/11/2022, Udenyca Cycle 3 FOLFOX 08/25/2022, Udenyca Cycle 4 FOLFOX 09/09/2022, Udenyca CTs abdomen/pelvis 10/01/2022-heterogeneous nodules and masses throughout the liver are enlarged or new in the interval.  Index mass in the inferior right hepatic lobe measures 4.4 x 7.9 cm, previously 2.9 x 5.2 cm.  Primary cecal mass appears grossly similar; adjacent ileocolic mesenteric lymph nodes are minimally  smaller. 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  Right breast cancer 08/28/2015-pT2pN0, stage IIa invasive ductal carcinoma, grade 1, HER2 negative, ER positive, PR positive-adjuvant radiation and anastrozole 12/29/2015-May 2022 4.  Left breast cancer 08/28/2015,pT1cpN0, stage Ia invasive ductal carcinoma, grade 1, HER2 negative, ER positive, PR positive-adjuvant radiation, anastrozole 12/29/2015-May 2022   5.  Hypertension   6.  Upper abdominal pain-likely secondary to metastatic disease involving the liver and the cecal tumor   7.  Port-A-Cath placement 07/23/2022   8.  Acute DVT involving left femoral vein, left popliteal vein and left peroneal veins 10/27/2022, Eliquis initiated    Disposition: Christina [name] has completed 3 cycles of FOLFIRI/Avastin.  Her performance status continues to decline.  She is not a candidate for further chemotherapy in her current condition.  She and her daughter acknowledge she is not doing well and agree with holding chemotherapy today.  We decided to go ahead and get CT scans, then see her back next week to review the results.  We had preliminary discussion regarding a hospice referral.  They agree with this plan.  CT scans 12/01/2022.  Follow-up visit 12/02/2022.    Ned Card ANP/GNP-BC   11/25/2022  11:01 AM

## 2022-11-25 NOTE — Patient Instructions (Signed)

## 2022-11-25 NOTE — Progress Notes (Signed)
Patient seen by Lisa Thomas NP today  Vitals are within treatment parameters.  Labs reviewed by Lisa Thomas NP and are within treatment parameters.  Per physician team, patient will not be receiving treatment today.  

## 2022-11-25 NOTE — Addendum Note (Signed)
Addended by: Velna Hatchet on: 11/25/2022 11:10 AM   Modules accepted: Orders

## 2022-11-25 NOTE — Addendum Note (Signed)
Addended by: Darrall Dears on: 11/25/2022 08:19 AM   Modules accepted: Orders

## 2022-11-26 ENCOUNTER — Other Ambulatory Visit: Payer: Self-pay

## 2022-11-26 NOTE — Addendum Note (Signed)
Addended by: Justice Britain on: 11/26/2022 08:59 AM   Modules accepted: Orders

## 2022-11-27 ENCOUNTER — Inpatient Hospital Stay: Payer: Medicare Other

## 2022-11-30 ENCOUNTER — Ambulatory Visit (HOSPITAL_BASED_OUTPATIENT_CLINIC_OR_DEPARTMENT_OTHER): Admission: RE | Admit: 2022-11-30 | Payer: Medicare Other | Source: Ambulatory Visit

## 2022-12-01 ENCOUNTER — Telehealth: Payer: Self-pay

## 2022-12-01 NOTE — Telephone Encounter (Signed)
   Regenia Kleiner May 21, 1946 [id number]  Dear Lonna Cobb , NP:  We have scheduled the above named patient for a(n)  procedure. Our records show that (s)he is on anticoagulation therapy.  Please advise as to whether the patient may come off their therapy of Eliquis  2 days prior to their procedure which is scheduled for 01/31/23.  Please route your response to [facility] or fax response to [phone].  Sincerely,    [facility]

## 2022-12-02 ENCOUNTER — Other Ambulatory Visit: Payer: Self-pay

## 2022-12-02 ENCOUNTER — Encounter (HOSPITAL_BASED_OUTPATIENT_CLINIC_OR_DEPARTMENT_OTHER): Payer: Self-pay

## 2022-12-02 ENCOUNTER — Encounter: Payer: Self-pay | Admitting: Nurse Practitioner

## 2022-12-02 ENCOUNTER — Emergency Department (HOSPITAL_BASED_OUTPATIENT_CLINIC_OR_DEPARTMENT_OTHER): Payer: Medicare Other

## 2022-12-02 ENCOUNTER — Inpatient Hospital Stay: Payer: Medicare Other | Admitting: Nurse Practitioner

## 2022-12-02 ENCOUNTER — Observation Stay (HOSPITAL_BASED_OUTPATIENT_CLINIC_OR_DEPARTMENT_OTHER)
Admission: EM | Admit: 2022-12-02 | Discharge: 2022-12-04 | Disposition: A | Discharge status: Home Health | Payer: Medicare Other | Attending: Internal Medicine | Admitting: Internal Medicine

## 2022-12-02 ENCOUNTER — Inpatient Hospital Stay (HOSPITAL_BASED_OUTPATIENT_CLINIC_OR_DEPARTMENT_OTHER): Payer: Medicare Other | Admitting: Nurse Practitioner

## 2022-12-02 ENCOUNTER — Ambulatory Visit (HOSPITAL_BASED_OUTPATIENT_CLINIC_OR_DEPARTMENT_OTHER)
Admission: RE | Admit: 2022-12-02 | Discharge: 2022-12-02 | Disposition: A | Discharge status: Home / Self Care | Payer: Medicare Other | Source: Ambulatory Visit | Attending: Nurse Practitioner | Admitting: Nurse Practitioner

## 2022-12-02 VITALS — BP 108/75 | HR 102 | Temp 96.4°F | Resp 16 | Wt 110.0 lb

## 2022-12-02 DIAGNOSIS — Z7901 Long term (current) use of anticoagulants: Secondary | ICD-10-CM | POA: Insufficient documentation

## 2022-12-02 DIAGNOSIS — Z853 Personal history of malignant neoplasm of breast: Secondary | ICD-10-CM | POA: Diagnosis not present

## 2022-12-02 DIAGNOSIS — R1310 Dysphagia, unspecified: Secondary | ICD-10-CM

## 2022-12-02 DIAGNOSIS — Z79899 Other long term (current) drug therapy: Secondary | ICD-10-CM | POA: Diagnosis not present

## 2022-12-02 DIAGNOSIS — E860 Dehydration: Secondary | ICD-10-CM | POA: Diagnosis not present

## 2022-12-02 DIAGNOSIS — I10 Essential (primary) hypertension: Secondary | ICD-10-CM | POA: Diagnosis present

## 2022-12-02 DIAGNOSIS — I82409 Acute embolism and thrombosis of unspecified deep veins of unspecified lower extremity: Secondary | ICD-10-CM | POA: Insufficient documentation

## 2022-12-02 DIAGNOSIS — R627 Adult failure to thrive: Secondary | ICD-10-CM | POA: Diagnosis not present

## 2022-12-02 DIAGNOSIS — C182 Malignant neoplasm of ascending colon: Secondary | ICD-10-CM

## 2022-12-02 DIAGNOSIS — Z1152 Encounter for screening for COVID-19: Secondary | ICD-10-CM | POA: Insufficient documentation

## 2022-12-02 DIAGNOSIS — R55 Syncope and collapse: Secondary | ICD-10-CM | POA: Diagnosis present

## 2022-12-02 DIAGNOSIS — C189 Malignant neoplasm of colon, unspecified: Secondary | ICD-10-CM | POA: Diagnosis present

## 2022-12-02 DIAGNOSIS — R1010 Upper abdominal pain, unspecified: Secondary | ICD-10-CM | POA: Diagnosis not present

## 2022-12-02 DIAGNOSIS — Z86718 Personal history of other venous thrombosis and embolism: Secondary | ICD-10-CM | POA: Insufficient documentation

## 2022-12-02 DIAGNOSIS — Z85038 Personal history of other malignant neoplasm of large intestine: Secondary | ICD-10-CM | POA: Diagnosis not present

## 2022-12-02 LAB — URINALYSIS, ROUTINE W REFLEX MICROSCOPIC
Bacteria, UA: NONE SEEN
Glucose, UA: NEGATIVE mg/dL
Hgb urine dipstick: NEGATIVE
Ketones, ur: 15 mg/dL — AB
Leukocytes,Ua: NEGATIVE
Nitrite: NEGATIVE
Protein, ur: 100 mg/dL — AB
Specific Gravity, Urine: 1.031 — ABNORMAL HIGH (ref 1.005–1.030)
pH: 5.5 (ref 5.0–8.0)

## 2022-12-02 LAB — COMPREHENSIVE METABOLIC PANEL
ALT: 7 U/L (ref 0–44)
AST: 24 U/L (ref 15–41)
Albumin: 3.3 g/dL — ABNORMAL LOW (ref 3.5–5.0)
Alkaline Phosphatase: 168 U/L — ABNORMAL HIGH (ref 38–126)
Anion gap: 18 — ABNORMAL HIGH (ref 5–15)
BUN: 17 mg/dL (ref 8–23)
CO2: 16 mmol/L — ABNORMAL LOW (ref 22–32)
Calcium: 9.2 mg/dL (ref 8.9–10.3)
Chloride: 108 mmol/L (ref 98–111)
Creatinine, Ser: 0.73 mg/dL (ref 0.44–1.00)
GFR, Estimated: >60 mL/min (ref >60)
Glucose, Bld: 103 mg/dL — ABNORMAL HIGH (ref 70–99)
Potassium: 3.9 mmol/L (ref 3.5–5.1)
Sodium: 142 mmol/L (ref 135–145)
Total Bilirubin: 0.8 mg/dL (ref 0.3–1.2)
Total Protein: 6.1 g/dL — ABNORMAL LOW (ref 6.5–8.1)

## 2022-12-02 LAB — RESP PANEL BY RT-PCR (RSV, FLU A&B, COVID)  RVPGX2
Influenza A by PCR: NEGATIVE
Influenza B by PCR: NEGATIVE
Resp Syncytial Virus by PCR: NEGATIVE
SARS Coronavirus 2 by RT PCR: NEGATIVE

## 2022-12-02 LAB — CBC WITH DIFFERENTIAL/PLATELET
Abs Immature Granulocytes: 0.87 10*3/uL — ABNORMAL HIGH (ref 0.00–0.07)
Basophils Absolute: 0.1 10*3/uL (ref 0.0–0.1)
Basophils Relative: 0 %
Eosinophils Absolute: 0 10*3/uL (ref 0.0–0.5)
Eosinophils Relative: 0 %
HCT: 32 % — ABNORMAL LOW (ref 36.0–46.0)
Hemoglobin: 9.8 g/dL — ABNORMAL LOW (ref 12.0–15.0)
Immature Granulocytes: 4 %
Lymphocytes Relative: 5 %
Lymphs Abs: 1 10*3/uL (ref 0.7–4.0)
MCH: 26.5 pg (ref 26.0–34.0)
MCHC: 30.6 g/dL (ref 30.0–36.0)
MCV: 86.5 fL (ref 80.0–100.0)
Monocytes Absolute: 1.1 10*3/uL — ABNORMAL HIGH (ref 0.1–1.0)
Monocytes Relative: 6 %
Neutro Abs: 16.9 10*3/uL — ABNORMAL HIGH (ref 1.7–7.7)
Neutrophils Relative %: 85 %
Platelets: 379 10*3/uL (ref 150–400)
RBC: 3.7 MIL/uL — ABNORMAL LOW (ref 3.87–5.11)
RDW: 18.6 % — ABNORMAL HIGH (ref 11.5–15.5)
WBC: 20 10*3/uL — ABNORMAL HIGH (ref 4.0–10.5)
nRBC: 0.3 % — ABNORMAL HIGH (ref 0.0–0.2)

## 2022-12-02 LAB — LACTIC ACID, PLASMA
Lactic Acid, Venous: 2.1 mmol/L (ref 0.5–1.9)
Lactic Acid, Venous: 2.5 mmol/L (ref 0.5–1.9)
Lactic Acid, Venous: 2 mmol/L (ref 0.5–1.9)

## 2022-12-02 LAB — MAGNESIUM: Magnesium: 1.7 mg/dL (ref 1.7–2.4)

## 2022-12-02 LAB — CBG MONITORING, ED: Glucose-Capillary: 106 mg/dL — ABNORMAL HIGH (ref 70–99)

## 2022-12-02 MED ORDER — LACTATED RINGERS IV BOLUS
500.0000 mL | Freq: Once | INTRAVENOUS | Status: AC
  Administered 2022-12-02: 500 mL via INTRAVENOUS

## 2022-12-02 NOTE — ED Notes (Signed)
Dr Naasz at bedside  

## 2022-12-02 NOTE — ED Notes (Signed)
Daughter at bedside.

## 2022-12-02 NOTE — Plan of Care (Signed)
[facility] will assume care on arrival to accepting facility. Until arrival, care as per EDP. However, [facility] available 24/7 for questions and assistance.  Nursing staff, please page [facility] Admits and Consults ([phone]) as soon as the patient arrives to the hospital.   

## 2022-12-02 NOTE — Progress Notes (Signed)
[facility] OFFICE PROGRESS NOTE   Diagnosis: Colon cancer  INTERVAL HISTORY:   [name] returns as scheduled.  At her last visit 11/25/2022 she was noted to have a continued decline in her performance status.  She was not felt to be a candidate for further chemotherapy.  She was referred for CT scans.  Her daughter contacted the office earlier this week to report Christina [name] declined to have the CT scans.  She is weaker.  Appetite is poor.  She vomits intermittently.  Had loose stools yesterday.  No fever.  No shortness of breath.  She is having trouble swallowing solids, tolerating liquids okay.  She spends the majority of the day in bed or on the couch.  Her daughter is assisting with all ADLs.  Objective:  Vital signs in last 24 hours:  Blood pressure 108/75, pulse (!) 102, temperature (!) 96.4 F (35.8 C), temperature source Tympanic, resp. rate 16, weight 110 lb (49.9 kg), SpO2 100 %.   Hectic appearing elderly female. HEENT: Mucous membranes appear moist. Resp: Lungs clear bilaterally. Cardio: Regular, tachycardic. GI: Abdomen soft and nontender.  No hepatomegaly.  No mass. Vascular: No leg edema. Neuro: Alert and oriented.  Moves all extremities.  Following commands.  Grip strength equal. Port-A-Cath without erythema.  Lab Results:  Lab Results  Component Value Date   WBC 12.4 (H) 11/25/2022   HGB 9.5 (L) 11/25/2022   HCT 31.0 (L) 11/25/2022   MCV 86.4 11/25/2022   PLT 369 11/25/2022   NEUTROABS 8.5 (H) 11/25/2022    Imaging:  No results found.  Medications: I have reviewed the patient's current medications.  Assessment/Plan: Metastatic colon cancer CT abdomen/pelvis 06/22/2022, interval appendectomy, extensive interstitial and extraluminal gas surrounding the surgical staples at the resection margin, extensive cecal wall and terminal ileum thickening, pathologic adenopathy in the ileocolic lymph nodes, interval development of by lobar  hypoenhancing hepatic masses Ultrasound-guided biopsy of left liver mass 06/28/2022-static adenocarcinoma, CDX2 and CK20 positive; microsatellite stable, tumor mutation burden 4, K-ras G13D Colonoscopy 07/19/2022-malignant tumor in the cecum-moderately differentiated adenocarcinoma with mucinous features and background of high-grade dysplasia, ileocecal valve polyp-negative for malignancy Cycle 1 FOLFOX 07/28/2022 Cycle 2 FOLFOX 08/11/2022, Udenyca Cycle 3 FOLFOX 08/25/2022, Udenyca Cycle 4 FOLFOX 09/09/2022, Udenyca CTs abdomen/pelvis 10/01/2022-heterogeneous nodules and masses throughout the liver are enlarged or new in the interval.  Index mass in the inferior right hepatic lobe measures 4.4 x 7.9 cm, previously 2.9 x 5.2 cm.  Primary cecal mass appears grossly similar; adjacent ileocolic mesenteric lymph nodes are minimally smaller. 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  Right breast cancer 08/28/2015-pT2pN0, stage IIa invasive ductal carcinoma, grade 1, HER2 negative, ER positive, PR positive-adjuvant radiation and anastrozole 12/29/2015-May 2022 4.  Left breast cancer 08/28/2015,pT1cpN0, stage Ia invasive ductal carcinoma, grade 1, HER2 negative, ER positive, PR positive-adjuvant radiation, anastrozole 12/29/2015-May 2022   5.  Hypertension   6.  Upper abdominal pain-likely secondary to metastatic disease involving the liver and the cecal tumor   7.  Port-A-Cath placement 07/23/2022   8.  Acute DVT involving left femoral vein, left popliteal vein and left  peroneal veins 10/27/2022, Eliquis initiated  Disposition: Christina [name] continues to decline.  She and her daughter understand the decline is due to progression of the cancer.  They further understand she is not a candidate for additional treatment.  We again discussed supportive/comfort care with a hospice referral.  We discussed CODE STATUS with her and her daughter.  They want to wait for other family members to arrive before making a decision.  Christina [name] had an episode of decreased responsiveness in the exam room.  She vomited.  We are concerned for aspiration.  At present she is awake, alert, oriented.  We are making arrangements for additional evaluation in the emergency department, transfer to [facility] for admission.  Dr. Truett Perna reviewed her case with the [facility] emergency room physician.  Patient seen with Dr. Truett Perna.  Lonna Cobb ANP/GNP-BC   12/02/2022  4:45 PM This was a shared visit with Lonna Cobb.  Christina [name] was interviewed and examined.  She presented to the office for a planned restaging evaluation.  She had an episode of decreased responsiveness while drinking CT contrast.  Her mental status quickly recovered over a few minutes.  Her performance status has declined over the past month.  She now has an ECOG 3-4 performance status.  She is dependent on her daughter for ADLs.  We discussed the poor prognosis with Christina [name] and her daughter.  She is not a candidate for further chemotherapy.  I recommend hospice care.  She appears to be a candidate for residential hospice.  We discussed CPR and ACLS.  We recommend a no CODE BLUE status.  She would like to speak with additional family members prior to deciding on CODE STATUS.  Christina [name] will be referred to the emergency room for hospital admission this evening.  I will check on her 12/03/2022.  I recommend an [facility] referral to consider transfer to [facility].   I was present for greater than 50% of today's  visit.  I performed medical decision making.  Mancel Bale, MD

## 2022-12-02 NOTE — ED Triage Notes (Signed)
Arrives to ED on stretcher. Pt alert and oriented person, place, situation.   Pt on campus for cancer services. Pt was in room with family. Family reports pt began to shake for a few moments then had difficulty speaking was 'out of it'- this seemed to last for ~2 minutes.  Arrives with accessed port- received ~271mL NS.

## 2022-12-02 NOTE — ED Provider Notes (Signed)
[place] EMERGENCY DEPARTMENT AT [facility] Provider Note   CSN: [id number] Arrival date & time: 12/02/22  1710     History  Chief Complaint  Patient presents with   Near Syncope   Altered Mental Status    Dibbie Maddy is a 77 y.o. female with metastatic colon cancer, breast cancer, HTN, anemia, prolonged QT presents with LOC/unresponsive episode, c/f aspiration.  Patient presents with her daughter who reports recent continued decline in her performance status.  She is taking poor p.o. and her daughter notes that she has had a hard time to get her to eat or drink anything over the last several days.  On arrival to the cancer center here [facility] today she was referred to oncology for CT scans to demonstrate progression of her cancer.  Patient seemed "out of it" and like she was responding more slowly.  While she was drinking the oral contrast patient had an episode of unresponsiveness or loss of consciousness.  She had an episode of vomiting and there was concern that she may have aspirated the contrast media.  She was hypotensive and pale appearing.  She had a pulse during the entire episode.  Patient eventually came around after several minutes and was tachycardic.  She received approximately 200 cc of fluid through her port prior to arrival.  On arrival her vitals are stable and patient is responsive, A&O x 4.  I received a call from patient's oncologist Dr. Truett Perna who relayed to me what happened and stated that he recommends admission to [facility] emergency department and discussion of hospice and code status.  Further discussion with the daughter and patient.  Patient states that she has not had any pain recently, has felt generally weak but otherwise nothing out of the ordinary, states she has not been able to eat much.  She has not had any urinary symptoms, abdominal pain, nausea vomit diarrhea constipation, chest pain shortness of breath or cough.  No swelling in her  legs.  She does not feel short of breath now.  She states she felt very weak earlier but does not really remember what happened.     Near Syncope  Altered Mental Status      Home Medications Prior to Admission medications   Medication Sig Start Date End Date Taking? Authorizing Provider  acetaminophen (TYLENOL) 500 MG tablet Take 500 mg by mouth every 6 (six) hours as needed for fever.    [provider]  APIXABAN Everlene Balls) VTE STARTER PACK (10MG  AND 5MG ) Take as directed on package: start with two-5mg  tablets twice daily for 7 days. On day 8, switch to one-5mg  tablet twice daily. 10/27/22   Rana Snare, NP  HYDROmorphone (DILAUDID) 2 MG tablet Take 1 tablet (2 mg total) by mouth every 6 (six) hours as needed for severe pain or moderate pain. Patient not taking: Reported on 10/27/2022 07/21/22   Ladene Artist, MD  lidocaine-prilocaine (EMLA) cream Apply 1 Application topically as needed. Patient not taking: Reported on 10/27/2022 07/21/22   Ladene Artist, MD  megestrol (MEGACE) 400 MG/10ML suspension Take 5 mLs (200 mg total) by mouth 2 (two) times daily. 11/22/22   Rana Snare, NP  olopatadine (PATADAY) 0.1 % ophthalmic solution Place 1 drop into both eyes 2 times daily. Patient not taking: Reported on 06/22/2022 12/02/21   Raspet, Noberto Retort, PA-C  Potassium Chloride 40 MEQ/15ML (20%) SOLN Take 7.5 mLs by mouth 2 (two) times daily. Dilute in 4 oz of water.  11/22/22   Rana Snare, NP  predniSONE (DELTASONE) 10 MG tablet Take 1 tablet (10 mg total) by mouth daily with breakfast. 11/10/22   Ladene Artist, MD  prochlorperazine (COMPAZINE) 10 MG tablet Take 1/2 - 1 tablet (5 - 10 mg total) by mouth every 6  hours as needed for nausea or vomiting. Patient not taking: Reported on 10/27/2022 08/24/22   Rana Snare, NP  losartan-hydrochlorothiazide (HYZAAR) 100-25 MG tablet Take 1 tablet by mouth daily. 12/31/20 06/03/21        Allergies    Zofran [ondansetron hcl], Onion,  Simvastatin, Aspirin, and Hydrocodone    Review of Systems   Review of Systems  Cardiovascular:  Positive for near-syncope.   Review of systems Negative for f/c.  A 10 point review of systems was performed and is negative unless otherwise reported in HPI.  Physical Exam Updated Vital Signs BP 127/83   Pulse 84   Temp 97.9 F (36.6 C) (Oral)   Resp 19   Ht 5\' 6"  (1.676 m)   SpO2 100%   BMI 17.75 kg/m  Physical Exam General: Chronically ill-appearing elderly female, lying in bed.  HEENT: PERRLA, esotropia and horizontal nystagmus noted bilaterally, Sclera anicteric, MMM, trachea midline.  Cardiology: RRR, no murmurs/rubs/gallops. BL radial and DP pulses equal bilaterally.  Resp: Normal respiratory rate and effort. CTAB, no wheezes, rhonchi, crackles.  Abd: Soft, non-tender, non-distended. No rebound tenderness or guarding.  GU: Deferred. MSK: No peripheral edema or signs of trauma. Extremities without deformity or TTP. No cyanosis or clubbing. Skin: warm, dry. No rashes or lesions. Back: No CVA tenderness Neuro: A&Ox4, CNs II-XII grossly intact. MAEs. Sensation grossly intact.  Psych: Normal mood and affect.   ED Results / Procedures / Treatments   Labs (all labs ordered are listed, but only abnormal results are displayed) Labs Reviewed  CBC WITH DIFFERENTIAL/PLATELET - Abnormal; Notable for the following components:      Result Value   WBC 20.0 (*)    RBC 3.70 (*)    Hemoglobin 9.8 (*)    HCT 32.0 (*)    RDW 18.6 (*)    nRBC 0.3 (*)    Neutro Abs 16.9 (*)    Monocytes Absolute 1.1 (*)    Abs Immature Granulocytes 0.87 (*)    All other components within normal limits  COMPREHENSIVE METABOLIC PANEL - Abnormal; Notable for the following components:   CO2 16 (*)    Glucose, Bld 103 (*)    Total Protein 6.1 (*)    Albumin 3.3 (*)    Alkaline Phosphatase 168 (*)    Anion gap 18 (*)    All other components within normal limits  LACTIC ACID, PLASMA - Abnormal; Notable  for the following components:   Lactic Acid, Venous 2.1 (*)    All other components within normal limits  LACTIC ACID, PLASMA - Abnormal; Notable for the following components:   Lactic Acid, Venous 2.5 (*)    All other components within normal limits  URINALYSIS, ROUTINE W REFLEX MICROSCOPIC - Abnormal; Notable for the following components:   Specific Gravity, Urine 1.031 (*)    Bilirubin Urine SMALL (*)    Ketones, ur 15 (*)    Protein, ur 100 (*)    All other components within normal limits  LACTIC ACID, PLASMA - Abnormal; Notable for the following components:   Lactic Acid, Venous 2.0 (*)    All other components within normal limits  CBG MONITORING, ED - Abnormal; Notable  for the following components:   Glucose-Capillary 106 (*)    All other components within normal limits  RESP PANEL BY RT-PCR (RSV, FLU A&B, COVID)  RVPGX2  CULTURE, BLOOD (ROUTINE X 2)  CULTURE, BLOOD (ROUTINE X 2)  MAGNESIUM    EKG None  Radiology CT Head Wo Contrast  Result Date: 12/02/2022 CLINICAL DATA:  Mental status change, unknown cause. EXAM: CT HEAD WITHOUT CONTRAST TECHNIQUE: Contiguous axial images were obtained from the base of the skull through the vertex without intravenous contrast. RADIATION DOSE REDUCTION: This exam was performed according to the departmental dose-optimization program which includes automated exposure control, adjustment of the mA and/or kV according to patient size and/or use of iterative reconstruction technique. COMPARISON:  03/23/2021. FINDINGS: Brain: No acute intracranial hemorrhage, midline shift or mass effect. No extra-axial fluid collection. Periventricular and subcortical white matter hypodensities are present bilaterally. No hydrocephalus. Vascular: No hyperdense vessel or unexpected calcification. Skull: Normal. Negative for fracture or focal lesion. Sinuses/Orbits: A round density is present in the left maxillary sinus, possible mucosal retention cyst or polyp. No  acute orbital abnormality. Other: None. IMPRESSION: 1. No acute intracranial process. 2. Chronic microvascular ischemic changes. Electronically Signed   By: Thornell SartoriusLaura  Taylor M.D.   On: 12/02/2022 22:11   DG Chest Portable 1 View  Result Date: 12/02/2022 CLINICAL DATA:  Syncope, hypotension EXAM: PORTABLE CHEST 1 VIEW COMPARISON:  03/02/2022 FINDINGS: The heart size and mediastinal contours are within normal limits. Right chest port catheter. Both lungs are clear. The visualized skeletal structures are unremarkable. IMPRESSION: No acute abnormality of the lungs. Electronically Signed   By: Jearld LeschAlex D Bibbey M.D.   On: 12/02/2022 18:52    Procedures Procedures    Medications Ordered in ED Medications  lactated ringers bolus 500 mL (0 mLs Intravenous Stopped 12/02/22 2127)    ED Course/ Medical Decision Making/ A&P                          Medical Decision Making Amount and/or Complexity of Data Reviewed Labs: ordered. Decision-making details documented in ED Course. Radiology: ordered. Decision-making details documented in ED Course.  Risk Decision regarding hospitalization.    This patient presents to the ED for concern of episode of LOC/possible aspiration, FTT and decreased PO intake; this involves an extensive number of treatment options, and is a complaint that carries with it a high risk of complications and morbidity.  I considered the following differential and admission for this acute, potentially life threatening condition.   MDM:    Patient initially presented to cancer center and was found to be hypotensive on arrival in s/o poor PO intake, FTT, generalized weakness. Had started to drink contrast and loss consciousness w/ episode of vomiting. She was not observed to have any seizure like activity. No FNDs to indicate CVA. Consider hypoglycemia/hyperglycemia, anemia and electrolyte abnormalities as possible etiologies. Consider arrhythmias and ACS, although without associated  symptoms, less likely. Consider mets or increased ICP given known underlying cancer. Daughter states that nystagmus observed is baseline for pt. Consider PE as well given cancer, although without hypoxia or sob, no cough/CP, no signs/sx sof DVT less likely. Patient with no abnormal lung sounds here, no resp distress, no SOB/cough, lower c/f aspiration pneumonitis at this time. CXR doesn't show PNA or aspiration.   For her hypotension, in s/o significantly decreased PO intake and was fluid responsive. She's had no fever or localizing symptoms for infection but do consider sepsis esp  with leukocytosis to 20. No CP/SOB/hypoxia to indicate cardiogenic/obstructive shock. She hasn't had any hypotensive BPs here and has felt generally weak but otherwise at baseline. Patient's oncologist recommended evaluation and subsequent admission to hospital, chart review note states they have made arrangements for her to be admitted at [facility]. Patient's w/u here today only significant for mild lactic acidosis, leukocytosis, and anion gap w/ ketonuria, likely d/t decreased PO intake. Will give IVF and consult to hospitalist.   Clinical Course as of 12/02/22 2343  Thu Dec 02, 2022  1900 WBC(!): 20.0 +Leukocytosis [HN]  1901 Hemoglobin(!): 9.8 C/w prior values [HN]  1901 DG Chest Portable 1 View FINDINGS: The heart size and mediastinal contours are within normal limits. Right chest port catheter. Both lungs are clear. The visualized skeletal structures are unremarkable.  IMPRESSION: No acute abnormality of the lungs   [HN]  1901 Glucose-Capillary(!): 106 [HN]  1901 Magnesium: 1.7 [HN]  2103 Lactic Acid, Venous(!!): 2.5 Giving IVF [HN]  2228 Urinalysis, Routine w reflex microscopic -Urine, Clean Catch(!) +ketonuria, proteinuria [HN]  2327 CT Head Wo Contrast 1. No acute intracranial process. 2. Chronic microvascular ischemic changes.   [HN]    Clinical Course User Index [HN] Loetta Rough, MD     Labs: I Ordered, and personally interpreted labs.  The pertinent results include:  those listed above  Imaging Studies ordered: I ordered imaging studies including CXR, CTH I independently visualized and interpreted imaging. I agree with the radiologist interpretation  Additional history obtained from chart review, daughter at bedside.    Cardiac Monitoring: The patient was maintained on a cardiac monitor.  I personally viewed and interpreted the cardiac monitored which showed an underlying rhythm of: NSR  Reevaluation: After the interventions noted above, I reevaluated the patient and found that they have :improved  Social Determinants of Health: Patient lives with daughter  Disposition:  Admit  Co morbidities that complicate the patient evaluation  Past Medical History:  Diagnosis Date   Anemia    Breast cancer 08/28/2015   Bilateral   Breast cancer of upper-outer quadrant of right female breast 06/23/2015   High cholesterol    Hx of radiation therapy 11/05/15- 12/16/15   Right Breast and Left Breast   Hypertension    Personal history of radiation therapy    Right Breast Cancer   Vitamin D deficiency 06/29/2018     Medicines Meds ordered this encounter  Medications   lactated ringers bolus 500 mL    I have reviewed the patients home medicines and have made adjustments as needed  Problem List / ED Course: Problem List Items Addressed This Visit   None Visit Diagnoses     Syncope and collapse    -  Primary   Failure to thrive in adult       Dehydration                       This note was created using dictation software, which may contain spelling or grammatical errors.    Loetta Rough, MD 12/02/22 [phone]

## 2022-12-03 ENCOUNTER — Encounter (HOSPITAL_COMMUNITY): Payer: Self-pay

## 2022-12-03 DIAGNOSIS — I10 Essential (primary) hypertension: Secondary | ICD-10-CM | POA: Diagnosis not present

## 2022-12-03 DIAGNOSIS — R627 Adult failure to thrive: Secondary | ICD-10-CM | POA: Diagnosis not present

## 2022-12-03 DIAGNOSIS — Z1152 Encounter for screening for COVID-19: Secondary | ICD-10-CM | POA: Diagnosis not present

## 2022-12-03 DIAGNOSIS — Z7901 Long term (current) use of anticoagulants: Secondary | ICD-10-CM | POA: Diagnosis not present

## 2022-12-03 DIAGNOSIS — Z79899 Other long term (current) drug therapy: Secondary | ICD-10-CM | POA: Diagnosis not present

## 2022-12-03 DIAGNOSIS — E860 Dehydration: Secondary | ICD-10-CM | POA: Diagnosis not present

## 2022-12-03 DIAGNOSIS — C182 Malignant neoplasm of ascending colon: Secondary | ICD-10-CM

## 2022-12-03 DIAGNOSIS — R1310 Dysphagia, unspecified: Secondary | ICD-10-CM

## 2022-12-03 DIAGNOSIS — R55 Syncope and collapse: Secondary | ICD-10-CM | POA: Diagnosis present

## 2022-12-03 DIAGNOSIS — Z853 Personal history of malignant neoplasm of breast: Secondary | ICD-10-CM | POA: Diagnosis not present

## 2022-12-03 DIAGNOSIS — R112 Nausea with vomiting, unspecified: Secondary | ICD-10-CM

## 2022-12-03 DIAGNOSIS — Z86718 Personal history of other venous thrombosis and embolism: Secondary | ICD-10-CM | POA: Diagnosis not present

## 2022-12-03 DIAGNOSIS — Z85038 Personal history of other malignant neoplasm of large intestine: Secondary | ICD-10-CM | POA: Diagnosis not present

## 2022-12-03 DIAGNOSIS — R1010 Upper abdominal pain, unspecified: Secondary | ICD-10-CM | POA: Diagnosis not present

## 2022-12-03 DIAGNOSIS — I82409 Acute embolism and thrombosis of unspecified deep veins of unspecified lower extremity: Secondary | ICD-10-CM | POA: Insufficient documentation

## 2022-12-03 LAB — COMPREHENSIVE METABOLIC PANEL
ALT: 11 U/L (ref 0–44)
AST: 27 U/L (ref 15–41)
Albumin: 2.6 g/dL — ABNORMAL LOW (ref 3.5–5.0)
Alkaline Phosphatase: 143 U/L — ABNORMAL HIGH (ref 38–126)
Anion gap: 11 (ref 5–15)
BUN: 18 mg/dL (ref 8–23)
CO2: 22 mmol/L (ref 22–32)
Calcium: 8.4 mg/dL — ABNORMAL LOW (ref 8.9–10.3)
Chloride: 107 mmol/L (ref 98–111)
Creatinine, Ser: 0.79 mg/dL (ref 0.44–1.00)
GFR, Estimated: >60 mL/min (ref >60)
Glucose, Bld: 90 mg/dL (ref 70–99)
Potassium: 3.5 mmol/L (ref 3.5–5.1)
Sodium: 140 mmol/L (ref 135–145)
Total Bilirubin: 0.9 mg/dL (ref 0.3–1.2)
Total Protein: 5.8 g/dL — ABNORMAL LOW (ref 6.5–8.1)

## 2022-12-03 LAB — CBC WITH DIFFERENTIAL/PLATELET
Abs Immature Granulocytes: 0.35 10*3/uL — ABNORMAL HIGH (ref 0.00–0.07)
Basophils Absolute: 0.1 10*3/uL (ref 0.0–0.1)
Basophils Relative: 1 %
Eosinophils Absolute: 0 10*3/uL (ref 0.0–0.5)
Eosinophils Relative: 0 %
HCT: 28.6 % — ABNORMAL LOW (ref 36.0–46.0)
Hemoglobin: 8.8 g/dL — ABNORMAL LOW (ref 12.0–15.0)
Immature Granulocytes: 2 %
Lymphocytes Relative: 15 %
Lymphs Abs: 2.5 10*3/uL (ref 0.7–4.0)
MCH: 26.9 pg (ref 26.0–34.0)
MCHC: 30.8 g/dL (ref 30.0–36.0)
MCV: 87.5 fL (ref 80.0–100.0)
Monocytes Absolute: 1.2 10*3/uL — ABNORMAL HIGH (ref 0.1–1.0)
Monocytes Relative: 7 %
Neutro Abs: 12.5 10*3/uL — ABNORMAL HIGH (ref 1.7–7.7)
Neutrophils Relative %: 75 %
Platelets: 304 10*3/uL (ref 150–400)
RBC: 3.27 MIL/uL — ABNORMAL LOW (ref 3.87–5.11)
RDW: 18 % — ABNORMAL HIGH (ref 11.5–15.5)
WBC: 16.7 10*3/uL — ABNORMAL HIGH (ref 4.0–10.5)
nRBC: 0.4 % — ABNORMAL HIGH (ref 0.0–0.2)

## 2022-12-03 LAB — PHOSPHORUS: Phosphorus: 2.9 mg/dL (ref 2.5–4.6)

## 2022-12-03 LAB — MAGNESIUM: Magnesium: 2 mg/dL (ref 1.7–2.4)

## 2022-12-03 LAB — LACTIC ACID, PLASMA: Lactic Acid, Venous: 1.2 mmol/L (ref 0.5–1.9)

## 2022-12-03 LAB — PROCALCITONIN: Procalcitonin: 0.42 ng/mL

## 2022-12-03 LAB — BRAIN NATRIURETIC PEPTIDE: B Natriuretic Peptide: 138.5 pg/mL — ABNORMAL HIGH (ref 0.0–100.0)

## 2022-12-03 MED ORDER — APIXABAN 5 MG PO TABS
5.0000 mg | ORAL_TABLET | Freq: Two times a day (BID) | ORAL | Status: DC
  Administered 2022-12-03 – 2022-12-04 (×2): 5 mg via ORAL
  Filled 2022-12-03 (×3): qty 1

## 2022-12-03 MED ORDER — OXYCODONE HCL 5 MG PO TABS: 5.0000 mg | ORAL_TABLET | ORAL | Status: DC | PRN

## 2022-12-03 MED ORDER — SODIUM CHLORIDE 0.9 % IV SOLN: INTRAVENOUS | Status: DC

## 2022-12-03 MED ORDER — CHLORHEXIDINE GLUCONATE CLOTH 2 % EX PADS
6.0000 | MEDICATED_PAD | Freq: Every day | CUTANEOUS | Status: DC
  Administered 2022-12-03 – 2022-12-04 (×2): 6 via TOPICAL

## 2022-12-03 MED ORDER — MEGESTROL ACETATE 400 MG/10ML PO SUSP
200.0000 mg | Freq: Two times a day (BID) | ORAL | Status: DC
  Administered 2022-12-03 – 2022-12-04 (×2): 200 mg via ORAL
  Filled 2022-12-03 (×3): qty 10

## 2022-12-03 MED ORDER — APIXABAN 5 MG PO TABS: 5.0000 mg | ORAL_TABLET | Freq: Two times a day (BID) | ORAL | Status: DC

## 2022-12-03 MED ORDER — ALBUTEROL SULFATE (2.5 MG/3ML) 0.083% IN NEBU: 2.5000 mg | INHALATION_SOLUTION | RESPIRATORY_TRACT | Status: DC | PRN

## 2022-12-03 MED ORDER — APIXABAN 5 MG PO TABS: 10.0000 mg | ORAL_TABLET | Freq: Two times a day (BID) | ORAL | Status: DC

## 2022-12-03 MED ORDER — ACETAMINOPHEN 500 MG PO TABS: 500.0000 mg | ORAL_TABLET | Freq: Four times a day (QID) | ORAL | Status: DC | PRN

## 2022-12-03 NOTE — ED Notes (Signed)
Report called to Renita, Charity fundraiser for [id number] Rm [id number] at [facility].

## 2022-12-03 NOTE — Assessment & Plan Note (Signed)
-   Recently diagnosed on 10/27/2022 - Duplex notes acute DVT involving left femoral, popliteal, and peroneal veins -Has been on Eliquis at home; discontinued in setting of pursuing hospice/comfort care

## 2022-12-03 NOTE — Assessment & Plan Note (Addendum)
-   Reports of nausea and vomiting at home.  Also some concern for possible aspiration - SLP eval cancelled after hospice discussions; patient okay to continue soft diet upon d/c home

## 2022-12-03 NOTE — Progress Notes (Signed)
Had patient drink some water from straw and she is able to tolerate well without any coughing or discomfort.

## 2022-12-03 NOTE — H&P (Signed)
History and Physical    Christina [name] ZOX:[id number] DOB: [birth year] DOA: 12/02/2022  PCP: Virgilio Belling, PA-C  Patient coming from: [facility]  I have personally briefly reviewed patient's old medical records in [facility]  Chief Complaint: near syncope , change in ms   HPI: Christina [name] is a 77 y.o. female with medical history significant of metastatic colon cancer, breast cancer  HTN, anemia, prolonged QT HLD, who presents to [facility] with complaint of syncope episode  with concern for aspiration. Patient initially presented to oncology with complaints of progressive decline and decrease po at home as well as n/v intermittently. It was also noted that patient was having trouble swallowing solids but able to tolerate liquids.  ON evaluation in oncology clinic patient was noted to have an episode of decrease responsive s/p episode of vomiting. Due to continued decline patient was referred to ED. OF at the time of emesis there was also concern for aspiration. OF note per oncology patient disease as progressed and is no longer a candidate for therapy and palliative /hospice was recommended however family deferred further decision at that time. Patient currently in reference to episode has no recollection, she notes no current n/v/d/dysuria/chest pain/sob/ at this time but notes she feels very weak.     ED Course:  Vitals Temp 96.4, bp 108/75, hr 102, rr 16  EKG: sinus tachycardia  Lab Wbc20 ( 12.4) , hgb 9.8,p, 16.9/left shift Na 142, K 3.9,  bicarb 16 , glu 103 cr 0.73  alkphos 168  ag 18 Mag 1.7lacti 2.1, lactic 2.5  Resp panel neg  UA ketones, neg  WJX:BJYNWGNFAO: 1. No acute intracranial process. 2. Chronic microvascular ischemic changes. Cxr NAD  Tx lr  Review of Systems: As per HPI otherwise 10 point review of systems negative.   Past Medical History:  Diagnosis Date   Anemia    Breast cancer 08/28/2015   Bilateral   Breast cancer of upper-outer quadrant of right  female breast 06/23/2015   High cholesterol    Hx of radiation therapy 11/05/15- 12/16/15   Right Breast and Left Breast   Hypertension    Personal history of radiation therapy    Right Breast Cancer   Vitamin D deficiency 06/29/2018    Past Surgical History:  Procedure Laterality Date   BIOPSY  07/19/2022   Procedure: BIOPSY;  Surgeon: Jenel Lucks, MD;  Location: [facility] ENDOSCOPY;  Service: Gastroenterology;;   BREAST LUMPECTOMY Left 08/28/2015   BREAST LUMPECTOMY Right 08/28/2015   BREAST LUMPECTOMY WITH NEEDLE LOCALIZATION AND AXILLARY SENTINEL LYMPH NODE BX Bilateral 08/28/2015   Procedure: BILATERAL BREAST LUMPECTOMY WITH BILATERAL  NEEDLE LOCALIZATION AND BILATERAL AXILLARY SENTINEL LYMPH NODE BX;  Surgeon: Harriette Bouillon, MD;  Location: [facility] OR;  Service: General;  Laterality: Bilateral;   COLONOSCOPY WITH PROPOFOL N/A 07/19/2022   Procedure: COLONOSCOPY WITH PROPOFOL;  Surgeon: Jenel Lucks, MD;  Location: [facility] ENDOSCOPY;  Service: Gastroenterology;  Laterality: N/A;   IR IMAGING GUIDED PORT INSERTION  07/23/2022   IR US GUIDE BX ASP/DRAIN  06/28/2022   LAPAROSCOPIC APPENDECTOMY N/A 03/03/2022   Procedure: APPENDECTOMY LAPAROSCOPIC;  Surgeon: Harriette Bouillon, MD;  Location: [facility] ORS;  Service: General;  Laterality: N/A;     reports that she has never smoked. She has never used smokeless tobacco. She reports that she does not drink alcohol and does not use drugs.  Allergies  Allergen Reactions   Zofran [Ondansetron Hcl] Hives    Itching, swelling lips   Onion  Nausea And Vomiting    White onion   Simvastatin Other (See Comments)    myalgia   Aspirin Nausea Only and Nausea And Vomiting   Hydrocodone Itching and Rash    Family History  Problem Relation Age of Onset   Cancer Mother        Uterine   Diabetes Sister    Lung cancer Brother    Diabetes Brother    Cancer - Colon Neg Hx    Esophageal cancer Neg Hx    Liver disease Neg Hx     Prior to Admission  medications   Medication Sig Start Date End Date Taking? Authorizing Provider  acetaminophen (TYLENOL) 500 MG tablet Take 500 mg by mouth every 6 (six) hours as needed for fever.    [provider]  APIXABAN Everlene Balls) VTE STARTER PACK (  AND ) Take as directed on package: start with two-5mg  tablets twice daily for 7 days. On day 8, switch to one-5mg  tablet twice daily. 10/27/22   Rana Snare, NP  HYDROmorphone (DILAUDID) 2 MG tablet Take 1 tablet (2 mg total) by mouth every 6 (six) hours as needed for severe pain or moderate pain. Patient not taking: Reported on 10/27/2022 07/21/22   Ladene Artist, MD  lidocaine-prilocaine (EMLA) cream Apply 1 Application topically as needed. Patient not taking: Reported on 10/27/2022 07/21/22   Ladene Artist, MD  megestrol (MEGACE) 400 MG/10ML suspension Take 5 mLs (200 mg total) by mouth 2 (two) times daily. 11/22/22   Rana Snare, NP  olopatadine (PATADAY) 0.1 % ophthalmic solution Place 1 drop into both eyes 2 times daily. Patient not taking: Reported on 06/22/2022 12/02/21   Raspet, Noberto Retort, PA-C  Potassium Chloride 40 MEQ/15ML (20%) SOLN Take 7.5 mLs by mouth 2 (two) times daily. Dilute in 4 oz of water. 11/22/22   Rana Snare, NP  predniSONE (DELTASONE) 10 MG tablet Take 1 tablet (10 mg total) by mouth daily with breakfast. 11/10/22   Ladene Artist, MD  prochlorperazine (COMPAZINE) 10 MG tablet Take 1/2 - 1 tablet (5 - 10 mg total) by mouth every 6  hours as needed for nausea or vomiting. Patient not taking: Reported on 10/27/2022 08/24/22   Rana Snare, NP  losartan-hydrochlorothiazide (HYZAAR) 100-25 MG tablet Take 1 tablet by mouth daily. 12/31/20 06/03/21      Physical Exam: Vitals:   12/03/22 0000 12/03/22 0030 12/03/22 0035 12/03/22 0201  BP: 121/87 130/80  134/83  Pulse: 79 85  87  Resp: Temp:   98.2 F (36.8 C) 97.7 F (36.5 C)  TempSrc:   Oral Oral  SpO2: 100% 100%  100%  Height:        Constitutional: NAD,  calm, comfortable, pale  Vitals:   12/03/22 0000 12/03/22 0030 12/03/22 0035 12/03/22 0201  BP: 121/87 130/80  134/83  Pulse: 79 85  87  Resp: Temp:   98.2 F (36.8 C) 97.7 F (36.5 C)  TempSrc:   Oral Oral  SpO2: 100% 100%  100%  Height:       Eyes: PERRL, lids and conjunctivae normal ENMT: Mucous membranes are moist. Posterior pharynx clear of any exudate or lesions.Normal dentition.  Neck: normal, supple, no masses, no thyromegaly Respiratory: clear to auscultation bilaterally, no wheezing, no crackles. Normal respiratory effort. No accessory muscle use.  Cardiovascular: Regular rate and rhythm, no murmurs / rubs / gallops. No extremity edema. 2+ pedal pulses.  Abdomen: no tenderness, no masses palpated. No hepatosplenomegaly. Bowel sounds positive.  Musculoskeletal: no clubbing / cyanosis. No joint deformity upper and lower extremities. Good ROM, no contractures. Normal muscle tone.  Skin: no rashes, lesions, ulcers. No induration Neurologic: CN 2-12 grossly intact. Sensation intact, . Strength 5/5 in all 4.  Psychiatric: Normal judgment and insight. Alert and oriented x 2. Normal mood.    Labs on Admission: I have personally reviewed following labs and imaging studies  CBC: Recent Labs  Lab 12/02/22 1806  WBC 20.0*  NEUTROABS 16.9*  HGB 9.8*  HCT 32.0*  MCV 86.5  PLT 379   Basic Metabolic Panel: Recent Labs  Lab 12/02/22 1806  NA 142  K 3.9  CL 108  CO2 16*  GLUCOSE 103*  BUN 17  CREATININE 0.73  CALCIUM 9.2  MG 1.7   GFR: Estimated Creatinine Clearance: 48.6 mL/min (by C-G formula based on SCr of 0.73 mg/dL). Liver Function Tests: Recent Labs  Lab 12/02/22 1806  AST 24  ALT 7  ALKPHOS 168*  BILITOT 0.8  PROT 6.1*  ALBUMIN 3.3*   No results for input(s): "LIPASE", "AMYLASE" in the last 168 hours. No results for input(s): "AMMONIA" in the last 168 hours. Coagulation Profile: No results for input(s): "INR", "PROTIME" in the last 168  hours. Cardiac Enzymes: No results for input(s): "CKTOTAL", "CKMB", "CKMBINDEX", "TROPONINI" in the last 168 hours. BNP (last 3 results) No results for input(s): "PROBNP" in the last 8760 hours. HbA1C: No results for input(s): "HGBA1C" in the last 72 hours. CBG: Recent Labs  Lab 12/02/22 1713  GLUCAP 106*   Lipid Profile: No results for input(s): "CHOL", "HDL", "LDLCALC", "TRIG", "CHOLHDL", "LDLDIRECT" in the last 72 hours. Thyroid Function Tests: No results for input(s): "TSH", "T4TOTAL", "FREET4", "T3FREE", "THYROIDAB" in the last 72 hours. Anemia Panel: No results for input(s): "VITAMINB12", "FOLATE", "FERRITIN", "TIBC", "IRON", "RETICCTPCT" in the last 72 hours. Urine analysis:    Component Value Date/Time   COLORURINE YELLOW 12/02/2022 2215   APPEARANCEUR CLEAR 12/02/2022 2215   LABSPEC 1.031 (H) 12/02/2022 2215   PHURINE 5.5 12/02/2022 2215   GLUCOSEU NEGATIVE 12/02/2022 2215   HGBUR NEGATIVE 12/02/2022 2215   HGBUR negative 04/01/2010 0000   BILIRUBINUR SMALL (A) 12/02/2022 2215   KETONESUR 15 (A) 12/02/2022 2215   PROTEINUR 100 (A) 12/02/2022 2215   UROBILINOGEN 0.2 10/25/2017 2022   NITRITE NEGATIVE 12/02/2022 2215   LEUKOCYTESUR NEGATIVE 12/02/2022 2215    Radiological Exams on Admission: CT Head Wo Contrast  Result Date: 12/02/2022 CLINICAL DATA:  Mental status change, unknown cause. EXAM: CT HEAD WITHOUT CONTRAST TECHNIQUE: Contiguous axial images were obtained from the base of the skull through the vertex without intravenous contrast. RADIATION DOSE REDUCTION: This exam was performed according to the departmental dose-optimization program which includes automated exposure control, adjustment of the mA and/or kV according to patient size and/or use of iterative reconstruction technique. COMPARISON:  03/23/2021. FINDINGS: Brain: No acute intracranial hemorrhage, midline shift or mass effect. No extra-axial fluid collection. Periventricular and subcortical white  matter hypodensities are present bilaterally. No hydrocephalus. Vascular: No hyperdense vessel or unexpected calcification. Skull: Normal. Negative for fracture or focal lesion. Sinuses/Orbits: A round density is present in the left maxillary sinus, possible mucosal retention cyst or polyp. No acute orbital abnormality. Other: None. IMPRESSION: 1. No acute intracranial process. 2. Chronic microvascular ischemic changes. Electronically Signed   By: Thornell Sartorius M.D.   On: 12/02/2022 22:11   DG Chest Portable 1 View  Result  Date: 12/02/2022 CLINICAL DATA:  Syncope, hypotension EXAM: PORTABLE CHEST 1 VIEW COMPARISON:  03/02/2022 FINDINGS: The heart size and mediastinal contours are within normal limits. Right chest port catheter. Both lungs are clear. The visualized skeletal structures are unremarkable. IMPRESSION: No acute abnormality of the lungs. Electronically Signed   By: Jearld Lesch M.D.   On: 12/02/2022 18:52    EKG: Independently reviewed. See above     Assessment/Plan    Failure to thrive insetting of progressive  metastatic colon ca  -patient per oncology is not a candidate for further treatment , hospice was recommended  -family /patient deliberating decision  -admit to for supportive care with ivfs  -repeat labs  - infectious work up negative thus far , patient without fever - lactic elevated due to severe dehydration /liver dysfunction due to mets -palliative care consult   Syncopal/near syncopal episode  -occurred in setting of n/v  -neuro non-focal  - CTH negative -presumed orthostatic/ vasovagal  -supportive care   Concern for Dysphagia  -? 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  -s/p appendectomy   Upper abdominal pain  -presumed due to mets to liver  Hypertension  -stable -holding medications currently insetting of presumed  orthostasis  DVT -continue Eliquis     DVT prophylaxis: Eliquis Code Status: full/ as discussed per patient wishes in event of cardiac arrest  Family Communication:  Disposition Plan: patient  expected to be admitted greater than 2 midnights  Consults called: palliative care  Admission status: med tele   Lurline Del MD [facility]   If 7PM-7AM, please contact night-coverage [email or web address] Password [facility]  12/03/2022, 4:15 AM

## 2022-12-03 NOTE — Evaluation (Signed)
SLP Cancellation Note  Patient Details Name: Christina [name] MRN: [id number] DOB: [birth year]   Cancelled treatment:       Reason Eval/Treat Not Completed: Other (comment) (pt now comfort care, will sign off. rn advised she was ordering pt softer foods and nectar liquids)   Chales Abrahams 12/03/2022, 6:32 PM   Rolena Infante, MS [facility] SLP Acute Rehab Services Office [phone]

## 2022-12-03 NOTE — Progress Notes (Signed)
  Daily Progress Note   Patient Name: Tanasha Nuber       Date: 12/03/2022 DOB: May 07, 1946  Age: 77 y.o. MRN#: [id number] Attending Physician: Lewie Chamber, MD Primary Care Physician: Chyrel Masson Admit Date: 12/02/2022 Length of Stay: 0 days  Discussed care with primary hospitalist today. PMT consult received to assist with GOC. During discussions today with other providers, patient and family have already elected to seek hospice care/inpatient hospice evaluation. Discussed this with [facility] liaison as well as seeking placement at [facility]. Noted with currently medical decisions determined, PMT consult will be discontinued at this time. Please reach out if our team can be of assistance in the future.    Alvester Morin, DO Palliative Care Provider PMT # [phone]

## 2022-12-03 NOTE — Progress Notes (Signed)
Progress Note    Christina [name]   QMV:[id number]  DOB: 02-Jul-1946  DOA: 12/02/2022     0 PCP: Virgilio Belling, PA-C  Initial CC: near syncope, AMS  Hospital Course: 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 She had presented to oncology on 12/02/2022 for restaging CT scans.  During appointment, she became altered and had a near syncopal episode.  There was also reports of nausea/vomiting intermittently at home and progressive decline.  She was sent to the ER from oncology office due to these events. CT head was negative for acute abnormalities and showed chronic microvascular ischemic changes.  CXR also negative for any signs of aspiration or focal infiltrates. Leukocytosis was noted on CBC otherwise labs were rather unremarkable.  Vitals were also normal. She was cachectic appearing with obvious progressive functional decline at home over the past several weeks to months.  Palliative care was consulted on admission to help assist with GOC discussions and code status discussions.  She has also been evaluated by oncology with recommendations for consideration of hospice.  No further cancer treatments are being recommended nor any further imaging surveillance.  Interval History:  Patient frail and resting in NAD.  She was unable to discuss much related to events leading to hospitalization. Daughter was present later in the afternoon and spoke with her bedside and in the hall regarding goals of care. They understand poor prognosis and overall decline along with oncology recommendation for hospice.  Preference is residential hospice as they are not sure if they can care for her at home.  Assessment and Plan: * Failure to thrive in adult - progressive functional decline and patient appears cachectic. Admitted after  near-syncope and AMS during oncology appt on 4/11 - patient no longer a treatment candidate in current state and does not appear to have ability for improvement enough to achieve this. Has been recommended for hospice per oncology and I agree with this as well. Her QOL appears poor and prognosis continues to worsen  - patient was not fully able to interact with conversation regarding code status and states family would help her make decisions also - palliative care consulted on admission as well -   Dysphagia - Reports of nausea and vomiting at home.  Also some concern for possible aspiration - SLP eval appropriate, follow-up assessment  DVT (deep venous thrombosis) - Recently diagnosed on 10/27/2022 - Duplex notes acute DVT involving left femoral, popliteal, and peroneal veins -Has been on Eliquis at home  Colon cancer - Followed by Dr. Truett Perna outpatient.  Diagnosed October 2023 -Had presented to oncology office on 12/02/2022 for repeat CT imaging for staging.  Due to her failure to thrive and overall functional decline, she has been recommended for no further treatments and to be considered for discharging home with hospice versus residential hospice per oncology - Palliative care also consulted on admission for assistance with GOC discussions.  Essential hypertension - BP meds on hold in setting of near syncope on admission   Old records reviewed in assessment of this patient  Antimicrobials:   DVT prophylaxis:   apixaban (ELIQUIS) tablet 5 mg   Code Status:   Code Status: Full Code  Mobility Assessment (last 72 hours)     Mobility Assessment     Row Name 12/03/22 0159  Does patient have an order for bedrest or is patient medically unstable No - Continue assessment       What is the highest level of mobility based on the progressive mobility assessment? Level 1 (Bedfast) - Unable to balance while sitting on edge of bed       Is the above level different from  baseline mobility prior to current illness? Yes - Recommend PT order                Barriers to discharge:  Disposition Plan:  Home with hospice vs residential hospice Status is:   Objective: Blood pressure (!) 142/78, pulse 77, temperature (!) 97.4 F (36.3 C), temperature source Oral, resp. rate 18, height 5\' 6"  (1.676 m), SpO2 100 %.  Examination:  Physical Exam Constitutional:      Comments: Chronically ill-appearing elderly woman resting in bed in no distress  HENT:     Head: Normocephalic and atraumatic.     Mouth/Throat:     Mouth: Mucous membranes are dry.  Eyes:     Extraocular Movements: Extraocular movements intact.     Comments: Dysconjugate gaze  Cardiovascular:     Rate and Rhythm: Normal rate and regular rhythm.  Pulmonary:     Effort: Pulmonary effort is normal. No respiratory distress.     Breath sounds: Normal breath sounds. No wheezing.  Abdominal:     General: Bowel sounds are normal. There is no distension.     Palpations: Abdomen is soft.     Tenderness: There is no abdominal tenderness.  Musculoskeletal:        General: Normal range of motion.     Cervical back: Normal range of motion and neck supple.  Skin:    General: Skin is warm and dry.  Neurological:     General: No focal deficit present.      Consultants:  Oncology   Procedures:    Data Reviewed: Results for orders placed or performed during the hospital encounter of 12/02/22 (from the past 24 hour(s))  CBG monitoring, ED     Status: Abnormal   Collection Time: 12/02/22  5:13 PM  Result Value Ref Range   Glucose-Capillary 106 (H) 70 - 99 mg/dL  CBC with Differential     Status: Abnormal   Collection Time: 12/02/22  6:06 PM  Result Value Ref Range   WBC 20.0 (H) 4.0 - 10.5 K/uL   RBC 3.70 (L) 3.87 - 5.11 MIL/uL   Hemoglobin 9.8 (L) 12.0 - 15.0 g/dL   HCT 14.4 (L) 31.5 - 40.0 %   MCV 86.5 80.0 - 100.0 fL   MCH 26.5 26.0 - 34.0 pg   MCHC 30.6 30.0 - 36.0 g/dL   RDW 86.7 (H)  61.9 - 15.5 %   Platelets 379 150 - 400 K/uL   nRBC 0.3 (H) 0.0 - 0.2 %   Neutrophils Relative % 85 %   Neutro Abs 16.9 (H) 1.7 - 7.7 K/uL   Lymphocytes Relative 5 %   Lymphs Abs 1.0 0.7 - 4.0 K/uL   Monocytes Relative 6 %   Monocytes Absolute 1.1 (H) 0.1 - 1.0 K/uL   Eosinophils Relative 0 %   Eosinophils Absolute 0.0 0.0 - 0.5 K/uL   Basophils Relative 0 %   Basophils Absolute 0.1 0.0 - 0.1 K/uL   Immature Granulocytes 4 %   Abs Immature Granulocytes 0.87 (H) 0.00 - 0.07 K/uL  Comprehensive metabolic panel     Status: Abnormal   Collection Time:  12/02/22  6:06 PM  Result Value Ref Range   Sodium 142 135 - 145 mmol/L   Potassium 3.9 3.5 - 5.1 mmol/L   Chloride 108 98 - 111 mmol/L   CO2 16 (L) 22 - 32 mmol/L   Glucose, Bld 103 (H) 70 - 99 mg/dL   BUN 17 8 - 23 mg/dL   Creatinine, Ser 8.41 0.44 - 1.00 mg/dL   Calcium 9.2 8.9 - 32.4 mg/dL   Total Protein 6.1 (L) 6.5 - 8.1 g/dL   Albumin 3.3 (L) 3.5 - 5.0 g/dL   AST 24 15 - 41 U/L   ALT 7 0 - 44 U/L   Alkaline Phosphatase 168 (H) 38 - 126 U/L   Total Bilirubin 0.8 0.3 - 1.2 mg/dL   GFR, Estimated >40 >10 mL/min   Anion gap 18 (H) 5 - 15  Magnesium     Status: None   Collection Time: 12/02/22  6:06 PM  Result Value Ref Range   Magnesium 1.7 1.7 - 2.4 mg/dL  Lactic acid, plasma     Status: Abnormal   Collection Time: 12/02/22  6:38 PM  Result Value Ref Range   Lactic Acid, Venous 2.1 (HH) 0.5 - 1.9 mmol/L  Blood culture (routine x 2)     Status: None (Preliminary result)   Collection Time: 12/02/22  6:38 PM   Specimen: BLOOD  Result Value Ref Range   Specimen Description      BLOOD PORTA CATH BOTTLES DRAWN AEROBIC AND ANAEROBIC Blood Culture adequate volume Performed at [facility], [address] [place], [place] [address]    Special Requests      NONE Performed at [facility], [address] [place], [place] [address]    Culture      NO GROWTH < 12 HOURS Performed at  [facility], [address] [place], [place] [address]    Report Status PENDING   Blood culture (routine x 2)     Status: None (Preliminary result)   Collection Time: 12/02/22  6:38 PM   Specimen: BLOOD  Result Value Ref Range   Specimen Description      BLOOD LEFT ANTECUBITAL BOTTLES DRAWN AEROBIC AND ANAEROBIC Blood Culture adequate volume Performed at [facility], [address] [place], [place] [address]    Special Requests      NONE Performed at [facility], [address] [place], [place] [address]    Culture      NO GROWTH < 12 HOURS Performed at [facility], [address] [place], [place] [address]    Report Status PENDING   Lactic acid, plasma     Status: Abnormal   Collection Time: 12/02/22  7:20 PM  Result Value Ref Range   Lactic Acid, Venous 2.5 (HH) 0.5 - 1.9 mmol/L  Resp panel by RT-PCR (RSV, Flu A&B, Covid) Anterior Nasal Swab     Status: None   Collection Time: 12/02/22  9:31 PM   Specimen: Anterior Nasal Swab  Result Value Ref Range   SARS Coronavirus 2 by RT PCR NEGATIVE NEGATIVE   Influenza A by PCR NEGATIVE NEGATIVE   Influenza B by PCR NEGATIVE NEGATIVE   Resp Syncytial Virus by PCR NEGATIVE NEGATIVE  Urinalysis, Routine w reflex microscopic -Urine, Clean Catch     Status: Abnormal   Collection Time: 12/02/22 10:15 PM  Result Value Ref Range   Color, Urine YELLOW YELLOW   APPearance CLEAR CLEAR   Specific  Gravity, Urine 1.031 (H) 1.005 - 1.030   pH 5.5 5.0 - 8.0   Glucose, UA NEGATIVE NEGATIVE mg/dL   Hgb urine dipstick NEGATIVE NEGATIVE   Bilirubin Urine SMALL (A) NEGATIVE   Ketones, ur 15 (A) NEGATIVE mg/dL   Protein, ur 098 (A) NEGATIVE mg/dL   Nitrite NEGATIVE NEGATIVE   Leukocytes,Ua NEGATIVE NEGATIVE   RBC / HPF 0-5 0 - 5 RBC/hpf   WBC, UA 0-5 0 - 5 WBC/hpf   Bacteria, UA NONE SEEN NONE SEEN   Squamous Epithelial / HPF 0-5 0 - 5 /HPF   Mucus PRESENT   Lactic acid, plasma      Status: Abnormal   Collection Time: 12/02/22 10:40 PM  Result Value Ref Range   Lactic Acid, Venous 2.0 (HH) 0.5 - 1.9 mmol/L  Comprehensive metabolic panel     Status: Abnormal   Collection Time: 12/03/22  5:40 AM  Result Value Ref Range   Sodium 140 135 - 145 mmol/L   Potassium 3.5 3.5 - 5.1 mmol/L   Chloride 107 98 - 111 mmol/L   CO2 22 22 - 32 mmol/L   Glucose, Bld 90 70 - 99 mg/dL   BUN 18 8 - 23 mg/dL   Creatinine, Ser 1.19 0.44 - 1.00 mg/dL   Calcium 8.4 (L) 8.9 - 10.3 mg/dL   Total Protein 5.8 (L) 6.5 - 8.1 g/dL   Albumin 2.6 (L) 3.5 - 5.0 g/dL   AST 27 15 - 41 U/L   ALT 11 0 - 44 U/L   Alkaline Phosphatase 143 (H) 38 - 126 U/L   Total Bilirubin 0.9 0.3 - 1.2 mg/dL   GFR, Estimated >14 >78 mL/min   Anion gap 11 5 - 15  CBC with Differential/Platelet     Status: Abnormal   Collection Time: 12/03/22  5:40 AM  Result Value Ref Range   WBC 16.7 (H) 4.0 - 10.5 K/uL   RBC 3.27 (L) 3.87 - 5.11 MIL/uL   Hemoglobin 8.8 (L) 12.0 - 15.0 g/dL   HCT 29.5 (L) 62.1 - 30.8 %   MCV 87.5 80.0 - 100.0 fL   MCH 26.9 26.0 - 34.0 pg   MCHC 30.8 30.0 - 36.0 g/dL   RDW 65.7 (H) 84.6 - 96.2 %   Platelets 304 150 - 400 K/uL   nRBC 0.4 (H) 0.0 - 0.2 %   Neutrophils Relative % 75 %   Neutro Abs 12.5 (H) 1.7 - 7.7 K/uL   Lymphocytes Relative 15 %   Lymphs Abs 2.5 0.7 - 4.0 K/uL   Monocytes Relative 7 %   Monocytes Absolute 1.2 (H) 0.1 - 1.0 K/uL   Eosinophils Relative 0 %   Eosinophils Absolute 0.0 0.0 - 0.5 K/uL   Basophils Relative 1 %   Basophils Absolute 0.1 0.0 - 0.1 K/uL   Immature Granulocytes 2 %   Abs Immature Granulocytes 0.35 (H) 0.00 - 0.07 K/uL  Magnesium     Status: None   Collection Time: 12/03/22  5:40 AM  Result Value Ref Range   Magnesium 2.0 1.7 - 2.4 mg/dL  Phosphorus     Status: None   Collection Time: 12/03/22  5:40 AM  Result Value Ref Range   Phosphorus 2.9 2.5 - 4.6 mg/dL  Lactic acid, plasma     Status: None   Collection Time: 12/03/22  5:40 AM  Result  Value Ref Range   Lactic Acid, Venous 1.2 0.5 - 1.9 mmol/L  Procalcitonin     Status:  None   Collection Time: 12/03/22  5:40 AM  Result Value Ref Range   Procalcitonin 0.42 ng/mL  Brain natriuretic peptide     Status: Abnormal   Collection Time: 12/03/22  5:40 AM  Result Value Ref Range   B Natriuretic Peptide 138.5 (H) 0.0 - 100.0 pg/mL    I have reviewed pertinent nursing notes, vitals, labs, and images as necessary. I have ordered labwork to follow up on as indicated.  I have reviewed the last notes from staff over past 24 hours. I have discussed patient's care plan and test results with nursing staff, CM/SW, and other staff as appropriate.  Time spent: Greater than 50% of the 55 minute visit was spent in counseling/coordination of care for the patient as laid out in the A&P.   LOS: 0 days   Lewie Chamber, MD [facility] 12/03/2022, 12:47 PM

## 2022-12-03 NOTE — TOC Progression Note (Signed)
Transition of Care [facility] - Progression Note    Patient Details  Name: Christina [name] MRN: [id number] Date of Birth: 03-Apr-1946  Transition of Care [facility]) CM/SW Contact  Beckie Busing, RN Phone Number:.c 12/03/2022, 1:08 PM  Clinical Narrative:     [facility] acknowledges consult for "Residential hospice eval per family preference". Referral has been sent to [facility] with [facility].            Expected Discharge Plan and Services                                               Social Determinants of Health (SDOH) Interventions SDOH Screenings   Food Insecurity: No Food Insecurity (06/24/2022)  Recent Concern: Food Insecurity - Food Insecurity Present (06/23/2022)  Housing: High Risk (06/23/2022)  Transportation Needs: Unmet Transportation Needs (10/29/2022)  Utilities: Not At Risk (07/12/2022)  Financial Resource Strain: Medium Risk (07/12/2022)  Tobacco Use: Low Risk  (12/02/2022)    Readmission Risk Interventions    06/30/2022    1:01 PM 06/24/2022   12:52 PM 03/06/2022   10:56 AM  Readmission Risk Prevention Plan  Transportation Screening Complete Complete Complete  PCP or Specialist Appt within 5-7 Days Complete  Complete  PCP or Specialist Appt within 3-5 Days  Complete   Home Care Screening Complete  Complete  Medication Review (RN CM) Complete  Complete  HRI or Home Care Consult  Complete   Social Work Consult for Recovery Care Planning/Counseling  Complete   Palliative Care Screening  Not Applicable   Medication Review Oceanographer)  Complete

## 2022-12-03 NOTE — Assessment & Plan Note (Signed)
-   BP meds on hold in setting of near syncope on admission

## 2022-12-03 NOTE — Assessment & Plan Note (Signed)
-   Followed by Dr. Truett Perna outpatient.  Diagnosed October 2023 -Had presented to oncology office on 12/02/2022 for repeat CT imaging for staging.  Due to her failure to thrive and overall functional decline, she has been recommended for no further treatments and to be considered for discharging home with hospice versus residential hospice per oncology - Palliative care also consulted on admission for assistance with GOC discussions.

## 2022-12-03 NOTE — Assessment & Plan Note (Signed)
-   progressive functional decline and patient appears cachectic. Admitted after near-syncope and AMS during oncology appt on 4/11 - patient no longer a treatment candidate in current state and does not appear to have ability for improvement enough to achieve this. Has been recommended for hospice per oncology and I agree with this as well. Her QOL appears poor and prognosis continues to worsen  - patient was not fully able to interact with conversation regarding code status and states family would help her make decisions also -Further discussions were held with family and hospice on admission.  Plan was for patient to discharge home with home hospice.

## 2022-12-03 NOTE — ED Notes (Signed)
Daughter, Carollee Herter, updated on pt room assigment.

## 2022-12-03 NOTE — Evaluation (Signed)
SLP Cancellation Note  Patient Details Name: Christina [name] MRN: [id number] DOB: 02-Jun-1946   Cancelled treatment:       Reason Eval/Treat Not Completed: Other (comment) (pt working with case Production designer, theatre/television/film, will continue efforts)   Chales Abrahams 12/03/2022, 1:24 PM

## 2022-12-03 NOTE — Progress Notes (Signed)
[facility]) Hospital Liaison RN note  Received request from [facility] for hospice services at home after discharge. Chart and patient information under review by Hospice physician.   Spoke with daughter Carollee Herter, to initiate education related to hospice philosophy, services, and team approach to care. Patient/family verbalized understanding of information given.   Per discussion, the plan is for discharge home by in the next day or two by EMS.  DME needs discussed. Patient has the following equipment in the home. Hospital bed and and wheelchair. No other immediate DME needs.   Please send signed and completed DNR with patient/family. Please provide prescriptions at discharge as needed for ongoing symptom management.   Please call with any hospice related questions or concerns.  Thank you for the opportunity to participate in this patient's care.  Thea Gist, Charity fundraiser, BSN [employer] [phone]

## 2022-12-03 NOTE — Progress Notes (Signed)
IP PROGRESS NOTE  Subjective:   Christina [name] was admitted with failure to thrive and after episode of unresponsiveness at the cancer center.  No complaint this morning.  Objective: Vital signs in last 24 hours: Blood pressure 128/73, pulse 87, temperature 98.9 F (37.2 C), temperature source Oral, resp. rate 18, height 5\' 6"  (1.676 m), SpO2 100 %.  Intake/Output from previous day: 04/11 0701 - 04/12 0700 In: 500 [IV Piggyback:500] Out: -   Physical Exam:  HEENT: No thrush Lungs: Clear bilaterally Cardiac: Regular rate and rhythm Abdomen: Nontender, no hepatosplenomegaly Extremities: No leg edema Neurologic: Alert, follows commands, oriented  Portacath/PICC-without erythema  Lab Results: Recent Labs    12/02/22 1806 12/03/22 0540  WBC 20.0* 16.7*  HGB 9.8* 8.8*  HCT 32.0* 28.6*  PLT 379 304    BMET Recent Labs    12/02/22 1806 12/03/22 0540  NA 142 140  K 3.9 3.5  CL 108 107  CO2 16* 22  GLUCOSE 103* 90  BUN 17 18  CREATININE 0.73 0.79  CALCIUM 9.2 8.4*    Lab Results  Component Value Date   CEA 3,032.05 (H) 11/25/2022    Studies/Results: CT Head Wo Contrast  Result Date: 12/02/2022 CLINICAL DATA:  Mental status change, unknown cause. EXAM: CT HEAD WITHOUT CONTRAST TECHNIQUE: Contiguous axial images were obtained from the base of the skull through the vertex without intravenous contrast. RADIATION DOSE REDUCTION: This exam was performed according to the departmental dose-optimization program which includes automated exposure control, adjustment of the mA and/or kV according to patient size and/or use of iterative reconstruction technique. COMPARISON:  03/23/2021. FINDINGS: Brain: No acute intracranial hemorrhage, midline shift or mass effect. No extra-axial fluid collection. Periventricular and subcortical white matter hypodensities are present bilaterally. No hydrocephalus. Vascular: No hyperdense vessel or unexpected calcification. Skull: Normal. Negative  for fracture or focal lesion. Sinuses/Orbits: A round density is present in the left maxillary sinus, possible mucosal retention cyst or polyp. No acute orbital abnormality. Other: None. IMPRESSION: 1. No acute intracranial process. 2. Chronic microvascular ischemic changes. Electronically Signed   By: Thornell Sartorius M.D.   On: 12/02/2022 22:11   DG Chest Portable 1 View  Result Date: 12/02/2022 CLINICAL DATA:  Syncope, hypotension EXAM: PORTABLE CHEST 1 VIEW COMPARISON:  03/02/2022 FINDINGS: The heart size and mediastinal contours are within normal limits. Right chest port catheter. Both lungs are clear. The visualized skeletal structures are unremarkable. IMPRESSION: No acute abnormality of the lungs. Electronically Signed   By: Jearld Lesch M.D.   On: 12/02/2022 18:52    Medications: I have reviewed the patient's current medications.  Assessment/Plan:  Metastatic colon cancer CT abdomen/pelvis 06/22/2022, interval appendectomy, extensive interstitial and extraluminal gas surrounding the surgical staples at the resection margin, extensive cecal wall and terminal ileum thickening, pathologic adenopathy in the ileocolic lymph nodes, interval development of by lobar hypoenhancing hepatic masses Ultrasound-guided biopsy of left liver mass 06/28/2022-static adenocarcinoma, CDX2 and CK20 positive; microsatellite stable, tumor mutation burden 4, K-ras G13D Colonoscopy 07/19/2022-malignant tumor in the cecum-moderately differentiated adenocarcinoma with mucinous features and background of high-grade dysplasia, ileocecal valve polyp-negative for malignancy Cycle 1 FOLFOX 07/28/2022 Cycle 2 FOLFOX 08/11/2022, Udenyca Cycle 3 FOLFOX 08/25/2022, Udenyca Cycle 4 FOLFOX 09/09/2022, Udenyca CTs abdomen/pelvis 10/01/2022-heterogeneous nodules and masses throughout the liver are enlarged or new in the interval.  Index mass in the inferior right hepatic lobe measures 4.4 x 7.9 cm, previously 2.9 x 5.2 cm.  Primary cecal  mass appears grossly similar; adjacent ileocolic mesenteric  lymph nodes are minimally smaller. 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  Right breast cancer 08/28/2015-pT2pN0, stage IIa invasive ductal carcinoma, grade 1, HER2 negative, ER positive, PR positive-adjuvant radiation and anastrozole 12/29/2015-May 2022 4.  Left breast cancer 08/28/2015,pT1cpN0, stage Ia invasive ductal carcinoma, grade 1, HER2 negative, ER positive, PR positive-adjuvant radiation, anastrozole 12/29/2015-May 2022   5.  Hypertension   6.  Upper abdominal pain-likely secondary to metastatic disease involving the liver and the cecal tumor   7.  Port-A-Cath placement 07/23/2022   8.  Acute DVT involving left femoral vein, left popliteal vein and left peroneal veins 10/27/2022, Eliquis initiated 9.  Admission 12/02/2022 with failure to thrive     [name] has metastatic colon cancer.  She was admitted yesterday with failure to thrive and an episode of altered mental status.  Her mental status appears at baseline today.  She was scheduled for restaging CTs yesterday.  These were not obtained due to the event requiring hospital admission.  I do not recommend further CT imaging.  She is not a candidate for further chemotherapy.  I recommend hospice care.  She appears to be a candidate for home hospice care.  She agrees to consultation with [facility] for home hospice care.  Recommendations: [facility] referral for home hospice care versus [facility] Continue discussions regarding CODE  STATUS when her daughter is available Discharge home if her family can care for her in the home with hospice Please call oncology as needed, I will check on her 12/06/2022 if she remains in the hospital  LOS: 0 days   Thornton Papas, MD   12/03/2022, 9:57 AM

## 2022-12-03 NOTE — Hospital Course (Addendum)
Ms. 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 She had presented to oncology on 12/02/2022 for restaging CT scans.  During appointment, she became altered and had a near syncopal episode.  There was also reports of nausea/vomiting intermittently at home and progressive decline.  She was sent to the ER from oncology office due to these events. CT head was negative for acute abnormalities and showed chronic microvascular ischemic changes.  CXR also negative for any signs of aspiration or focal infiltrates. Leukocytosis was noted on CBC otherwise labs were rather unremarkable.  Vitals were also normal. She was cachectic appearing with obvious progressive functional decline at home over the past several weeks to months.  Palliative care was consulted on admission to help assist with GOC discussions and code status discussions.  She has also been evaluated by oncology with recommendations for consideration of hospice.  No further cancer treatments are being recommended nor any further imaging surveillance.

## 2022-12-03 NOTE — ED Notes (Signed)
[facility] at bedside for transport. 

## 2022-12-04 ENCOUNTER — Other Ambulatory Visit (HOSPITAL_COMMUNITY): Payer: Self-pay

## 2022-12-04 DIAGNOSIS — R627 Adult failure to thrive: Secondary | ICD-10-CM | POA: Diagnosis not present

## 2022-12-04 DIAGNOSIS — C182 Malignant neoplasm of ascending colon: Secondary | ICD-10-CM | POA: Diagnosis not present

## 2022-12-04 MED ORDER — OXYCODONE HCL 5 MG PO TABS
5.0000 mg | ORAL_TABLET | ORAL | 0 refills | Status: DC | PRN
  Filled 2022-12-04: qty 30, 5d supply, fill #0

## 2022-12-04 MED ORDER — HEPARIN SOD (PORK) LOCK FLUSH 100 UNIT/ML IV SOLN
500.0000 [IU] | Freq: Once | INTRAVENOUS | Status: AC
  Administered 2022-12-04: 500 [IU] via INTRAVENOUS
  Filled 2022-12-04: qty 5

## 2022-12-04 NOTE — Discharge Summary (Signed)
Physician Discharge Summary   Christina [name] BJS:[id number] DOB: 06-27-1946 DOA: 12/02/2022  PCP: Christina Belling, PA-C  Admit date: 12/02/2022 Discharge date: 12/04/2022   Admitted From: Home Disposition:  Home with hospice Discharging physician: Christina Chamber, MD Barriers to discharge: none  Recommendations at discharge: Continue hospice/comfort care at home  Discharge Condition: fair CODE STATUS: DNR Diet recommendation:  Diet Orders (From admission, onward)     Start     Ordered   12/03/22 0606  DIET DYS 3 Room service appropriate? Yes; Fluid consistency: Thin  Diet effective now       Question Answer Comment  Room service appropriate? 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 She had presented to oncology on 12/02/2022 for restaging CT scans.  During appointment, she became altered and had a near syncopal episode.  There was also reports of nausea/vomiting intermittently at home and progressive decline.  She was sent to the ER from oncology office due to these events. CT head was negative for acute abnormalities and showed chronic microvascular ischemic changes.  CXR also negative for any signs of aspiration or focal infiltrates. Leukocytosis was noted on CBC otherwise labs were rather unremarkable.  Vitals were also normal. She was cachectic appearing with obvious progressive functional decline at home over the past several weeks to months.  Palliative care was consulted on admission to help assist with GOC discussions and code status discussions.  She has also been evaluated by oncology with recommendations for consideration of hospice.  No further cancer treatments are being recommended nor any further imaging  surveillance.  Assessment and Plan: * Failure to thrive in adult - progressive functional decline and patient appears cachectic. Admitted after near-syncope and AMS during oncology appt on 4/11 - patient no longer a treatment candidate in current state and does not appear to have ability for improvement enough to achieve this. Has been recommended for hospice per oncology and I agree with this as well. Her QOL appears poor and prognosis continues to worsen  - patient was not fully able to interact with conversation regarding code status and states family would help her make decisions also -Further discussions were held with family and hospice on admission.  Plan was for patient to discharge home with home hospice.  Dysphagia - Reports of nausea and vomiting at home.  Also some concern for possible aspiration - SLP eval cancelled after hospice discussions; patient okay to continue soft diet upon d/c home  DVT (deep venous thrombosis) - Recently diagnosed on 10/27/2022 - Duplex notes acute DVT involving left femoral, popliteal, and peroneal veins -Has been on Eliquis at home; discontinued in setting of pursuing hospice/comfort care  Colon cancer - Followed by Christina [name] outpatient.  Diagnosed October 2023 -Had presented to oncology office on 12/02/2022 for repeat CT imaging for staging.  Due to her failure to thrive and overall functional decline, she has been recommended for no further treatments and to be considered for discharging home with hospice  -After conversations with family and hospice, patient was accepted and arranged for discharging home with hospice  Essential hypertension - BP meds on hold in setting of near syncope on admission    Principal  Diagnosis: Failure to thrive in adult  Discharge Diagnoses: Active Hospital Problems   Diagnosis Date Noted   Failure to thrive in adult 12/02/2022    Priority: 1.   DVT (deep venous thrombosis) 12/03/2022   Dysphagia 12/03/2022    Colon cancer 07/21/2022   Essential hypertension 08/23/2006    Resolved Hospital Problems  No resolved problems to display.      Allergies as of 12/04/2022       Reactions   Zofran [ondansetron Hcl] Hives   Itching, swelling lips   Onion Nausea And Vomiting   White onion   Simvastatin Other (See Comments)   myalgia   Aspirin Nausea Only, Nausea And Vomiting   Hydrocodone Itching, Rash        Medication List     STOP taking these medications    acetaminophen 500 MG tablet Commonly known as: TYLENOL   Eliquis DVT/PE Starter Pack Generic drug: Apixaban Starter Pack (10mg  and 5mg )   HYDROmorphone 2 MG tablet Commonly known as: Dilaudid   lidocaine-prilocaine cream Commonly known as: EMLA   megestrol 40 MG/ML suspension Commonly known as: MEGACE   Potassium Chloride 40 MEQ/15ML (20%) Soln   predniSONE 10 MG tablet Commonly known as: DELTASONE   prochlorperazine 10 MG tablet Commonly known as: COMPAZINE       TAKE these medications    oxyCODONE 5 MG immediate release tablet Commonly known as: Oxy IR/ROXICODONE Take 1 tablet (5 mg total) by mouth every 4 (four) hours as needed for moderate pain.        Allergies  Allergen Reactions   Zofran [Ondansetron Hcl] Hives    Itching, swelling lips   Onion Nausea And Vomiting    White onion   Simvastatin Other (See Comments)    myalgia   Aspirin Nausea Only and Nausea And Vomiting   Hydrocodone Itching and Rash    Consultations: Oncology  Procedures:   Discharge Exam: BP (!) 149/76 (BP Location: Right Arm)   Pulse 79   Temp 98.9 F (37.2 C) (Oral)   Resp 18   Ht 5\' 6"  (1.676 m)   Wt 50.4 kg Comment: bed scale  SpO2 100%   BMI 17.93 kg/m  Physical Exam Constitutional:      Comments: Chronically ill-appearing elderly woman resting in bed in no distress  HENT:     Head: Normocephalic and atraumatic.     Mouth/Throat:     Mouth: Mucous membranes are dry.  Eyes:     Extraocular  Movements: Extraocular movements intact.     Comments: Dysconjugate gaze  Cardiovascular:     Rate and Rhythm: Normal rate and regular rhythm.  Pulmonary:     Effort: Pulmonary effort is normal. No respiratory distress.     Breath sounds: Normal breath sounds. No wheezing.  Abdominal:     General: Bowel sounds are normal. There is no distension.     Palpations: Abdomen is soft.     Tenderness: There is no abdominal tenderness.  Musculoskeletal:        General: Normal range of motion.     Cervical back: Normal range of motion and neck supple.  Skin:    General: Skin is warm and dry.  Neurological:     General: No focal deficit present.      The results of significant diagnostics from this hospitalization (including imaging, microbiology, ancillary and laboratory) are listed below for reference.   Microbiology: Recent Results (from the past 240 hour(s))  Blood culture (routine x  2)     Status: None (Preliminary result)   Collection Time: 12/02/22  6:38 PM   Specimen: BLOOD  Result Value Ref Range Status   Specimen Description   Final    BLOOD PORTA CATH BOTTLES DRAWN AEROBIC AND ANAEROBIC Blood Culture adequate volume Performed at [facility], [address] [place], [place] [address]    Special Requests   Final    NONE Performed at [facility], [address] [place], [place] [address]    Culture   Final    NO GROWTH 2 DAYS Performed at [facility], [address] [place], [place] [address]    Report Status PENDING  Incomplete  Blood culture (routine x 2)     Status: None (Preliminary result)   Collection Time: 12/02/22  6:38 PM   Specimen: BLOOD  Result Value Ref Range Status   Specimen Description   Final    BLOOD LEFT ANTECUBITAL BOTTLES DRAWN AEROBIC AND ANAEROBIC Blood Culture adequate volume Performed at [facility], [address] [place], [place] [address]    Special Requests   Final     NONE Performed at [facility], [address], [place], [place] [address]    Culture   Final    NO GROWTH 2 DAYS Performed at [facility], [address] [place], [place] [address]    Report Status PENDING  Incomplete  Resp panel by RT-PCR (RSV, Flu A&B, Covid) Anterior Nasal Swab     Status: None   Collection Time: 12/02/22  9:31 PM   Specimen: Anterior Nasal Swab  Result Value Ref Range Status   SARS Coronavirus 2 by RT PCR NEGATIVE NEGATIVE Final    Comment: (NOTE) SARS-CoV-2 target nucleic acids are NOT DETECTED.  The SARS-CoV-2 RNA is generally detectable in upper respiratory specimens during the acute phase of infection. The lowest concentration of SARS-CoV-2 viral copies this assay can detect is 138 copies/mL. A negative result does not preclude SARS-Cov-2 infection and should not be used as the sole basis for treatment or other patient management decisions. A negative result may occur with  improper specimen collection/handling, submission of specimen other than nasopharyngeal swab, presence of viral mutation(s) within the areas targeted by this assay, and inadequate number of viral copies(<138 copies/mL). A negative result must be combined with clinical observations, patient history, and epidemiological information. The expected result is Negative.  Fact Sheet for Patients:  BloggerCourse.com  Fact Sheet for Healthcare Providers:  SeriousBroker.it  This test is no t yet approved or cleared by the [place] FDA and  has been authorized for detection and/or diagnosis of SARS-CoV-2 by FDA under an Emergency Use Authorization (EUA). This EUA will remain  in effect (meaning this test can be used) for the duration of the COVID-19 declaration under Section 564(b)(1) of the Act, 21 U.S.C.section 360bbb-3(b)(1), unless the authorization is terminated  or revoked sooner.       Influenza A by  PCR NEGATIVE NEGATIVE Final   Influenza B by PCR NEGATIVE NEGATIVE Final    Comment: (NOTE) The Xpert Xpress SARS-CoV-2/FLU/RSV plus assay is intended as an aid in the diagnosis of influenza from Nasopharyngeal swab specimens and should not be used as a sole basis for treatment. Nasal washings and aspirates are unacceptable for Xpert Xpress SARS-CoV-2/FLU/RSV testing.  Fact Sheet for Patients: BloggerCourse.com  Fact Sheet for Healthcare Providers: SeriousBroker.it  This test is not yet approved or cleared by the [place] FDA  and has been authorized for detection and/or diagnosis of SARS-CoV-2 by FDA under an Emergency Use Authorization (EUA). This EUA will remain in effect (meaning this test can be used) for the duration of the COVID-19 declaration under Section 564(b)(1) of the Act, 21 U.S.C. section 360bbb-3(b)(1), unless the authorization is terminated or revoked.     Resp Syncytial Virus by PCR NEGATIVE NEGATIVE Final    Comment: (NOTE) Fact Sheet for Patients: BloggerCourse.com  Fact Sheet for Healthcare Providers: SeriousBroker.it  This test is not yet approved or cleared by the [place] FDA and has been authorized for detection and/or diagnosis of SARS-CoV-2 by FDA under an Emergency Use Authorization (EUA). This EUA will remain in effect (meaning this test can be used) for the duration of the COVID-19 declaration under Section 564(b)(1) of the Act, 21 U.S.C. section 360bbb-3(b)(1), unless the authorization is terminated or revoked.  Performed at [facility], [address] [place], [place] [address]      Labs: BNP (last 3 results) Recent Labs    12/03/22 0540  BNP 138.5*   Basic Metabolic Panel: Recent Labs  Lab 12/02/22 1806 12/03/22 0540  NA 142 140  K 3.9 3.5  CL 108 107  CO2 16* 22  GLUCOSE 103* 90  BUN 17 18   CREATININE 0.73 0.79  CALCIUM 9.2 8.4*  MG 1.7 2.0  PHOS  --  2.9   Liver Function Tests: Recent Labs  Lab 12/02/22 1806 12/03/22 0540  AST 24 27  ALT 7 11  ALKPHOS 168* 143*  BILITOT 0.8 0.9  PROT 6.1* 5.8*  ALBUMIN 3.3* 2.6*   No results for input(s): "LIPASE", "AMYLASE" in the last 168 hours. No results for input(s): "AMMONIA" in the last 168 hours. CBC: Recent Labs  Lab 12/02/22 1806 12/03/22 0540  WBC 20.0* 16.7*  NEUTROABS 16.9* 12.5*  HGB 9.8* 8.8*  HCT 32.0* 28.6*  MCV 86.5 87.5  PLT 379 304   Cardiac Enzymes: No results for input(s): "CKTOTAL", "CKMB", "CKMBINDEX", "TROPONINI" in the last 168 hours. BNP: Invalid input(s): "POCBNP" CBG: Recent Labs  Lab 12/02/22 1713  GLUCAP 106*   D-Dimer No results for input(s): "DDIMER" in the last 72 hours. Hgb A1c No results for input(s): "HGBA1C" in the last 72 hours. Lipid Profile No results for input(s): "CHOL", "HDL", "LDLCALC", "TRIG", "CHOLHDL", "LDLDIRECT" in the last 72 hours. Thyroid function studies No results for input(s): "TSH", "T4TOTAL", "T3FREE", "THYROIDAB" in the last 72 hours.  Invalid input(s): "FREET3" Anemia work up No results for input(s): "VITAMINB12", "FOLATE", "FERRITIN", "TIBC", "IRON", "RETICCTPCT" in the last 72 hours. Urinalysis    Component Value Date/Time   COLORURINE YELLOW 12/02/2022 2215   APPEARANCEUR CLEAR 12/02/2022 2215   LABSPEC 1.031 (H) 12/02/2022 2215   PHURINE 5.5 12/02/2022 2215   GLUCOSEU NEGATIVE 12/02/2022 2215   HGBUR NEGATIVE 12/02/2022 2215   HGBUR negative 04/01/2010 0000   BILIRUBINUR SMALL (A) 12/02/2022 2215   KETONESUR 15 (A) 12/02/2022 2215   PROTEINUR 100 (A) 12/02/2022 2215   UROBILINOGEN 0.2 10/25/2017 2022   NITRITE NEGATIVE 12/02/2022 2215   LEUKOCYTESUR NEGATIVE 12/02/2022 2215   Sepsis Labs Recent Labs  Lab 12/02/22 1806 12/03/22 0540  WBC 20.0* 16.7*   Microbiology Recent Results (from the past 240 hour(s))  Blood culture  (routine x 2)     Status: None (Preliminary result)   Collection Time: 12/02/22  6:38 PM   Specimen: BLOOD  Result Value Ref Range Status   Specimen Description   Final  BLOOD PORTA CATH BOTTLES DRAWN AEROBIC AND ANAEROBIC Blood Culture adequate volume Performed at [facility], [address] [place], [place] [address]    Special Requests   Final    NONE Performed at [facility], [address] [place], [place] [address]    Culture   Final    NO GROWTH 2 DAYS Performed at [facility], [address], [place], [place] [address]    Report Status PENDING  Incomplete  Blood culture (routine x 2)     Status: None (Preliminary result)   Collection Time: 12/02/22  6:38 PM   Specimen: BLOOD  Result Value Ref Range Status   Specimen Description   Final    BLOOD LEFT ANTECUBITAL BOTTLES DRAWN AEROBIC AND ANAEROBIC Blood Culture adequate volume Performed at [facility], [address] [place], [place] [address]    Special Requests   Final    NONE Performed at [facility], [address] [place], [place] [address]    Culture   Final    NO GROWTH 2 DAYS Performed at [facility], [address] [place], [place] [address]    Report Status PENDING  Incomplete  Resp panel by RT-PCR (RSV, Flu A&B, Covid) Anterior Nasal Swab     Status: None   Collection Time: 12/02/22  9:31 PM   Specimen: Anterior Nasal Swab  Result Value Ref Range Status   SARS Coronavirus 2 by RT PCR NEGATIVE NEGATIVE Final    Comment: (NOTE) SARS-CoV-2 target nucleic acids are NOT DETECTED.  The SARS-CoV-2 RNA is generally detectable in upper respiratory specimens during the acute phase of infection. The lowest concentration of SARS-CoV-2 viral copies this assay can detect is 138 copies/mL. A negative result does not preclude SARS-Cov-2 infection and should not be used as the sole basis for treatment or other  patient management decisions. A negative result may occur with  improper specimen collection/handling, submission of specimen other than nasopharyngeal swab, presence of viral mutation(s) within the areas targeted by this assay, and inadequate number of viral copies(<138 copies/mL). A negative result must be combined with clinical observations, patient history, and epidemiological information. The expected result is Negative.  Fact Sheet for Patients:  BloggerCourse.com  Fact Sheet for Healthcare Providers:  SeriousBroker.it  This test is no t yet approved or cleared by the [place] FDA and  has been authorized for detection and/or diagnosis of SARS-CoV-2 by FDA under an Emergency Use Authorization (EUA). This EUA will remain  in effect (meaning this test can be used) for the duration of the COVID-19 declaration under Section 564(b)(1) of the Act, 21 U.S.C.section 360bbb-3(b)(1), unless the authorization is terminated  or revoked sooner.       Influenza A by PCR NEGATIVE NEGATIVE Final   Influenza B by PCR NEGATIVE NEGATIVE Final    Comment: (NOTE) The Xpert Xpress SARS-CoV-2/FLU/RSV plus assay is intended as an aid in the diagnosis of influenza from Nasopharyngeal swab specimens and should not be used as a sole basis for treatment. Nasal washings and aspirates are unacceptable for Xpert Xpress SARS-CoV-2/FLU/RSV testing.  Fact Sheet for Patients: BloggerCourse.com  Fact Sheet for Healthcare Providers: SeriousBroker.it  This test is not yet approved or cleared by the [place] FDA and has been authorized for detection and/or diagnosis of SARS-CoV-2 by FDA under an Emergency Use Authorization (EUA). This EUA will remain in effect (meaning this test can be used) for the duration of the COVID-19 declaration under  Section 564(b)(1) of the Act, 21 U.S.C. section  360bbb-3(b)(1), unless the authorization is terminated or revoked.     Resp Syncytial Virus by PCR NEGATIVE NEGATIVE Final    Comment: (NOTE) Fact Sheet for Patients: BloggerCourse.com  Fact Sheet for Healthcare Providers: SeriousBroker.it  This test is not yet approved or cleared by the [place] FDA and has been authorized for detection and/or diagnosis of SARS-CoV-2 by FDA under an Emergency Use Authorization (EUA). This EUA will remain in effect (meaning this test can be used) for the duration of the COVID-19 declaration under Section 564(b)(1) of the Act, 21 U.S.C. section 360bbb-3(b)(1), unless the authorization is terminated or revoked.  Performed at [facility], [address] [place], [place] [address]     Procedures/Studies: CT Head Wo Contrast  Result Date: 12/02/2022 CLINICAL DATA:  Mental status change, unknown cause. EXAM: CT HEAD WITHOUT CONTRAST TECHNIQUE: Contiguous axial images were obtained from the base of the skull through the vertex without intravenous contrast. RADIATION DOSE REDUCTION: This exam was performed according to the departmental dose-optimization program which includes automated exposure control, adjustment of the mA and/or kV according to patient size and/or use of iterative reconstruction technique. COMPARISON:  03/23/2021. FINDINGS: Brain: No acute intracranial hemorrhage, midline shift or mass effect. No extra-axial fluid collection. Periventricular and subcortical white matter hypodensities are present bilaterally. No hydrocephalus. Vascular: No hyperdense vessel or unexpected calcification. Skull: Normal. Negative for fracture or focal lesion. Sinuses/Orbits: A round density is present in the left maxillary sinus, possible mucosal retention cyst or polyp. No acute orbital abnormality. Other: None. IMPRESSION: 1. No acute intracranial process. 2. Chronic microvascular ischemic  changes. Electronically Signed   By: Thornell Sartorius M.D.   On: 12/02/2022 22:11   DG Chest Portable 1 View  Result Date: 12/02/2022 CLINICAL DATA:  Syncope, hypotension EXAM: PORTABLE CHEST 1 VIEW COMPARISON:  03/02/2022 FINDINGS: The heart size and mediastinal contours are within normal limits. Right chest port catheter. Both lungs are clear. The visualized skeletal structures are unremarkable. IMPRESSION: No acute abnormality of the lungs. Electronically Signed   By: Jearld Lesch M.D.   On: 12/02/2022 18:52   DG ESOPHAGUS W SINGLE CM (SOL OR THIN BA)  Result Date: 11/24/2022 CLINICAL DATA:  Patient with recent food impaction requiring visit to the ED. Barium study requested for further evaluation. EXAM: ESOPHAGUS/BARIUM SWALLOW/TABLET STUDY TECHNIQUE: Single contrast examination was performed using thin liquid barium. This exam was performed by Alwyn Ren, NP, and was supervised and interpreted by Dr. Karin Golden. FLUOROSCOPY: Radiation Exposure Index (as provided by the fluoroscopic device): 7.40 mGy Kerma COMPARISON:  None Available. FINDINGS: Swallowing: Appears normal. No vestibular penetration or aspiration seen. Pharynx: Unremarkable. Esophagus: Normal appearance.  No mass or stricture observed. Esophageal motility: Dysmotility with tertiary contractions. Hiatal Hernia: None. Gastroesophageal reflux: None visualized. Ingested 13mm barium tablet: Became stuck in the mid to lower esophagus. However, there is no fixed mass or stricture in this location. Swallowed contrast passes around the tablet. This arrest is probably due to poor peristaltic motility. Other: None IMPRESSION: Limited study due to patient mobility. No aspiration observed, esophagus is without mass or stricture. Esophageal dysmotility with tertiary contractions. 13 mm barium tablet became stuck in the mid to lower esophagus. Multiple attempts made with water and barium were unsuccessful in getting the pill into the stomach, though  swallowed material passes easily around tablet. Read by: Alwyn Ren, NP Electronically Signed   By: Paulina Fusi M.D.   On: 11/24/2022 13:34  DG Neck Soft Tissue  Result Date: 11/16/2022 CLINICAL DATA:  Food bolus and neck. EXAM: NECK SOFT TISSUES - 1+ VIEW COMPARISON:  None Available. FINDINGS: There is no evidence of retropharyngeal soft tissue swelling or epiglottic enlargement. The cervical airway is unremarkable and no radio-opaque foreign body identified. Chest port is visualized on the right. IMPRESSION: Negative. Electronically Signed   By: Darliss Cheney M.D.   On: 11/16/2022 19:15     Time coordinating discharge: Over 30 minutes    Christina Chamber, MD  [facility] 12/04/2022, 4:35 PM

## 2022-12-04 NOTE — TOC Transition Note (Signed)
Transition of Care [facility]) - CM/SW Discharge Note   Patient Details  Name: Christina [name] MRN: [id number] Date of Birth: 1945/11/12  Transition of Care [facility]) CM/SW Contact:  Amada Jupiter, LCSW Phone Number: 12/04/2022, 11:15 AM   Clinical Narrative:     Pt medically cleared for dc home today and [facility] to follow in the home.  PTAR called 11:00 am.  Daughter aware and agreeable.  No further TOC needs.  Final next level of care: Home w Hospice Care Barriers to Discharge: Barriers Resolved   Patient Goals and CMS Choice      Discharge Placement                  Patient to be transferred to facility by: PTAR Name of family member notified: daughter, Carollee Herter Patient and family notified of of transfer: 12/04/22  Discharge Plan and Services Additional resources added to the After Visit Summary for                  DME Arranged: N/A DME Agency: NA       HH Arranged: RN HH Agency: [facility] (Physicist, medical)        Social Determinants of Health (SDOH) Interventions SDOH Screenings   Food Insecurity: No Food Insecurity (12/03/2022)  Housing: Low Risk  (12/03/2022)  Transportation Needs: No Transportation Needs (12/03/2022)  Recent Concern: Transportation Needs - Unmet Transportation Needs (10/29/2022)  Utilities: Not At Risk (12/03/2022)  Financial Resource Strain: Medium Risk (07/12/2022)  Tobacco Use: Low Risk  (12/03/2022)     Readmission Risk Interventions    06/30/2022    1:01 PM 06/24/2022   12:52 PM 03/06/2022   10:56 AM  Readmission Risk Prevention Plan  Transportation Screening Complete Complete Complete  PCP or Specialist Appt within 5-7 Days Complete  Complete  PCP or Specialist Appt within 3-5 Days  Complete   Home Care Screening Complete  Complete  Medication Review (RN CM) Complete  Complete  HRI or Home Care Consult  Complete   Social Work Consult for Recovery Care Planning/Counseling  Complete   Palliative  Care Screening  Not Applicable   Medication Review [employer])  Complete

## 2022-12-05 ENCOUNTER — Other Ambulatory Visit: Payer: Self-pay | Admitting: Oncology

## 2022-12-06 ENCOUNTER — Telehealth: Payer: Self-pay

## 2022-12-06 NOTE — Telephone Encounter (Signed)
I returned a phone call from [facility] to inform them that Dr. Truett Perna will be the attending physician and there is no need for a code blue.

## 2022-12-07 ENCOUNTER — Telehealth: Payer: Self-pay | Admitting: *Deleted

## 2022-12-07 ENCOUNTER — Ambulatory Visit (HOSPITAL_BASED_OUTPATIENT_CLINIC_OR_DEPARTMENT_OTHER): Payer: Medicare Other

## 2022-12-07 LAB — CULTURE, BLOOD (ROUTINE X 2)
Culture: NO GROWTH
Culture: NO GROWTH

## 2022-12-07 NOTE — Telephone Encounter (Signed)
Informed daughter that FMLA is completed and being sent back to employer. She would like the original mailed to her . Mailed today.

## 2022-12-08 NOTE — Progress Notes (Signed)
[place] Certification for [employer] completed for family member and faxed with confirmation received.

## 2022-12-09 ENCOUNTER — Inpatient Hospital Stay: Payer: Medicare Other

## 2022-12-09 ENCOUNTER — Other Ambulatory Visit (HOSPITAL_COMMUNITY): Payer: Self-pay

## 2022-12-09 ENCOUNTER — Inpatient Hospital Stay: Payer: Medicare Other | Admitting: Oncology

## 2022-12-09 MED ORDER — MAX RELIEF JUNIOR 160 MG/5ML PO ELIX
ORAL_SOLUTION | ORAL | 0 refills | Status: DC
  Filled 2022-12-09: qty 354, 4d supply, fill #0

## 2022-12-11 ENCOUNTER — Inpatient Hospital Stay: Payer: Medicare Other

## 2022-12-14 ENCOUNTER — Ambulatory Visit (HOSPITAL_COMMUNITY): Payer: Medicare Other

## 2022-12-16 ENCOUNTER — Ambulatory Visit (HOSPITAL_COMMUNITY): Payer: Medicare Other

## 2022-12-21 ENCOUNTER — Other Ambulatory Visit: Payer: Self-pay | Admitting: Oncology

## 2022-12-22 DEATH — deceased

## 2023-01-31 ENCOUNTER — Ambulatory Visit (HOSPITAL_COMMUNITY): Admit: 2023-01-31 | Payer: Medicare Other | Admitting: Gastroenterology

## 2023-01-31 ENCOUNTER — Encounter (HOSPITAL_COMMUNITY): Payer: Self-pay

## 2023-01-31 SURGERY — ESOPHAGOGASTRODUODENOSCOPY (EGD) WITH PROPOFOL
Anesthesia: Monitor Anesthesia Care

## 2023-08-12 LAB — MOLECULAR PATHOLOGY
